# Patient Record
Sex: Male | Born: 1935 | Race: White | Hispanic: No | Marital: Married | Smoking: Former smoker
Health system: Southern US, Community
[De-identification: ages and names within clinical notes are randomized; demographics above are authoritative.]

## PROBLEM LIST (undated history)

## (undated) DIAGNOSIS — I739 Peripheral vascular disease, unspecified: Secondary | ICD-10-CM

## (undated) DIAGNOSIS — I447 Left bundle-branch block, unspecified: Secondary | ICD-10-CM

## (undated) DIAGNOSIS — I442 Atrioventricular block, complete: Secondary | ICD-10-CM

## (undated) DIAGNOSIS — K4090 Unilateral inguinal hernia, without obstruction or gangrene, not specified as recurrent: Secondary | ICD-10-CM

## (undated) DIAGNOSIS — E119 Type 2 diabetes mellitus without complications: Secondary | ICD-10-CM

## (undated) DIAGNOSIS — D649 Anemia, unspecified: Secondary | ICD-10-CM

## (undated) DIAGNOSIS — N189 Chronic kidney disease, unspecified: Secondary | ICD-10-CM

## (undated) DIAGNOSIS — H269 Unspecified cataract: Secondary | ICD-10-CM

## (undated) DIAGNOSIS — D469 Myelodysplastic syndrome, unspecified: Secondary | ICD-10-CM

## (undated) DIAGNOSIS — C801 Malignant (primary) neoplasm, unspecified: Secondary | ICD-10-CM

## (undated) DIAGNOSIS — I10 Essential (primary) hypertension: Secondary | ICD-10-CM

## (undated) DIAGNOSIS — Z950 Presence of cardiac pacemaker: Secondary | ICD-10-CM

## (undated) DIAGNOSIS — E039 Hypothyroidism, unspecified: Secondary | ICD-10-CM

## (undated) DIAGNOSIS — I509 Heart failure, unspecified: Secondary | ICD-10-CM

## (undated) HISTORY — PX: PROSTATE SURGERY: SHX751

## (undated) HISTORY — PX: HIATAL HERNIA REPAIR: SHX195

## (undated) HISTORY — PX: ABDOMINAL AORTIC ANEURYSM REPAIR: SUR1152

## (undated) HISTORY — DX: Type 2 diabetes mellitus without complications: E11.9

## (undated) HISTORY — DX: Malignant (primary) neoplasm, unspecified: C80.1

## (undated) HISTORY — PX: TONSILLECTOMY: SUR1361

## (undated) HISTORY — PX: TONSILLECTOMY: SHX5217

## (undated) HISTORY — DX: Myelodysplastic syndrome, unspecified: D46.9

## (undated) HISTORY — DX: Anemia, unspecified: D64.9

## (undated) HISTORY — DX: Essential (primary) hypertension: I10

## (undated) HISTORY — DX: Left bundle-branch block, unspecified: I44.7

---

## 1977-07-10 HISTORY — PX: HERNIA REPAIR: SHX51

## 1983-12-09 HISTORY — PX: THYROID LOBECTOMY: SHX420

## 1996-07-10 HISTORY — PX: ACROMIONECTOMY: SHX1124

## 2000-02-07 ENCOUNTER — Encounter: Payer: Self-pay | Admitting: Internal Medicine

## 2000-02-07 ENCOUNTER — Encounter: Admission: RE | Admit: 2000-02-07 | Discharge: 2000-02-07 | Payer: Self-pay | Admitting: Internal Medicine

## 2000-02-17 ENCOUNTER — Encounter: Payer: Self-pay | Admitting: Vascular Surgery

## 2000-02-20 ENCOUNTER — Ambulatory Visit (HOSPITAL_COMMUNITY): Admission: RE | Admit: 2000-02-20 | Discharge: 2000-02-20 | Payer: Self-pay | Admitting: Vascular Surgery

## 2000-04-23 ENCOUNTER — Inpatient Hospital Stay (HOSPITAL_COMMUNITY): Admission: RE | Admit: 2000-04-23 | Discharge: 2000-04-27 | Payer: Self-pay | Admitting: Vascular Surgery

## 2000-04-23 ENCOUNTER — Encounter: Payer: Self-pay | Admitting: Vascular Surgery

## 2000-04-25 ENCOUNTER — Encounter: Payer: Self-pay | Admitting: Vascular Surgery

## 2000-05-24 ENCOUNTER — Encounter: Payer: Self-pay | Admitting: Vascular Surgery

## 2000-05-24 ENCOUNTER — Encounter: Admission: RE | Admit: 2000-05-24 | Discharge: 2000-05-24 | Payer: Self-pay | Admitting: Vascular Surgery

## 2000-11-29 ENCOUNTER — Encounter: Payer: Self-pay | Admitting: Vascular Surgery

## 2000-11-29 ENCOUNTER — Encounter: Admission: RE | Admit: 2000-11-29 | Discharge: 2000-11-29 | Payer: Self-pay | Admitting: Vascular Surgery

## 2000-12-31 ENCOUNTER — Encounter: Payer: Self-pay | Admitting: Vascular Surgery

## 2001-01-02 ENCOUNTER — Ambulatory Visit (HOSPITAL_COMMUNITY): Admission: RE | Admit: 2001-01-02 | Discharge: 2001-01-02 | Payer: Self-pay | Admitting: Vascular Surgery

## 2001-06-17 ENCOUNTER — Encounter: Payer: Self-pay | Admitting: *Deleted

## 2001-06-17 ENCOUNTER — Encounter: Admission: RE | Admit: 2001-06-17 | Discharge: 2001-06-17 | Payer: Self-pay | Admitting: *Deleted

## 2001-06-18 ENCOUNTER — Encounter (INDEPENDENT_AMBULATORY_CARE_PROVIDER_SITE_OTHER): Payer: Self-pay

## 2001-06-18 ENCOUNTER — Ambulatory Visit (HOSPITAL_BASED_OUTPATIENT_CLINIC_OR_DEPARTMENT_OTHER): Admission: RE | Admit: 2001-06-18 | Discharge: 2001-06-18 | Payer: Self-pay | Admitting: *Deleted

## 2001-06-24 ENCOUNTER — Ambulatory Visit: Admission: RE | Admit: 2001-06-24 | Discharge: 2001-09-22 | Payer: Self-pay | Admitting: Radiation Oncology

## 2001-06-27 ENCOUNTER — Encounter: Payer: Self-pay | Admitting: Vascular Surgery

## 2001-06-27 ENCOUNTER — Encounter: Admission: RE | Admit: 2001-06-27 | Discharge: 2001-06-27 | Payer: Self-pay | Admitting: Vascular Surgery

## 2001-09-23 ENCOUNTER — Ambulatory Visit: Admission: RE | Admit: 2001-09-23 | Discharge: 2001-12-22 | Payer: Self-pay | Admitting: Radiation Oncology

## 2002-04-08 ENCOUNTER — Ambulatory Visit: Admission: RE | Admit: 2002-04-08 | Discharge: 2002-04-08 | Payer: Self-pay | Admitting: Radiation Oncology

## 2002-06-25 ENCOUNTER — Encounter: Admission: RE | Admit: 2002-06-25 | Discharge: 2002-06-25 | Payer: Self-pay | Admitting: Vascular Surgery

## 2002-06-25 ENCOUNTER — Encounter: Payer: Self-pay | Admitting: Vascular Surgery

## 2003-06-25 ENCOUNTER — Encounter: Admission: RE | Admit: 2003-06-25 | Discharge: 2003-06-25 | Payer: Self-pay | Admitting: Vascular Surgery

## 2004-06-30 ENCOUNTER — Encounter: Admission: RE | Admit: 2004-06-30 | Discharge: 2004-06-30 | Payer: Self-pay | Admitting: Vascular Surgery

## 2004-07-25 ENCOUNTER — Encounter (INDEPENDENT_AMBULATORY_CARE_PROVIDER_SITE_OTHER): Payer: Self-pay | Admitting: Specialist

## 2004-07-25 ENCOUNTER — Ambulatory Visit (HOSPITAL_COMMUNITY): Admission: RE | Admit: 2004-07-25 | Discharge: 2004-07-25 | Payer: Self-pay | Admitting: *Deleted

## 2004-11-04 ENCOUNTER — Ambulatory Visit: Payer: Self-pay | Admitting: Oncology

## 2004-12-09 ENCOUNTER — Encounter (INDEPENDENT_AMBULATORY_CARE_PROVIDER_SITE_OTHER): Payer: Self-pay | Admitting: *Deleted

## 2004-12-09 ENCOUNTER — Ambulatory Visit: Payer: Self-pay | Admitting: Oncology

## 2004-12-09 ENCOUNTER — Ambulatory Visit (HOSPITAL_COMMUNITY): Admission: RE | Admit: 2004-12-09 | Discharge: 2004-12-09 | Payer: Self-pay | Admitting: Oncology

## 2005-01-05 ENCOUNTER — Ambulatory Visit: Payer: Self-pay | Admitting: Oncology

## 2005-03-02 ENCOUNTER — Ambulatory Visit: Payer: Self-pay | Admitting: Oncology

## 2005-04-27 ENCOUNTER — Ambulatory Visit: Payer: Self-pay | Admitting: Oncology

## 2005-06-23 ENCOUNTER — Ambulatory Visit: Payer: Self-pay | Admitting: Oncology

## 2005-06-29 ENCOUNTER — Encounter: Admission: RE | Admit: 2005-06-29 | Discharge: 2005-06-29 | Payer: Self-pay | Admitting: Vascular Surgery

## 2005-08-17 ENCOUNTER — Ambulatory Visit: Payer: Self-pay | Admitting: Oncology

## 2005-10-19 ENCOUNTER — Ambulatory Visit: Payer: Self-pay | Admitting: Oncology

## 2005-10-20 LAB — CBC WITH DIFFERENTIAL/PLATELET
Basophils Absolute: 0 10*3/uL (ref 0.0–0.1)
Eosinophils Absolute: 0.4 10*3/uL (ref 0.0–0.5)
HCT: 33.8 % — ABNORMAL LOW (ref 38.7–49.9)
HGB: 11.4 g/dL — ABNORMAL LOW (ref 13.0–17.1)
MONO#: 0.6 10*3/uL (ref 0.1–0.9)
NEUT%: 62.6 % (ref 40.0–75.0)
Platelets: 261 10*3/uL (ref 145–400)
WBC: 9.4 10*3/uL (ref 4.0–10.0)
lymph#: 2.4 10*3/uL (ref 0.9–3.3)

## 2005-11-10 LAB — CBC WITH DIFFERENTIAL/PLATELET
Basophils Absolute: 0.1 10*3/uL (ref 0.0–0.1)
EOS%: 4.1 % (ref 0.0–7.0)
Eosinophils Absolute: 0.3 10*3/uL (ref 0.0–0.5)
HCT: 31.3 % — ABNORMAL LOW (ref 38.7–49.9)
HGB: 10.5 g/dL — ABNORMAL LOW (ref 13.0–17.1)
MCH: 36.3 pg — ABNORMAL HIGH (ref 28.0–33.4)
MCV: 108.2 fL — ABNORMAL HIGH (ref 81.6–98.0)
MONO%: 6.4 % (ref 0.0–13.0)
NEUT#: 5.3 10*3/uL (ref 1.5–6.5)
NEUT%: 64.7 % (ref 40.0–75.0)
Platelets: 246 10*3/uL (ref 145–400)
RDW: 20.7 % — ABNORMAL HIGH (ref 11.2–14.6)

## 2005-11-24 LAB — CBC & DIFF AND RETIC
Basophils Absolute: 0 10*3/uL (ref 0.0–0.1)
EOS%: 4.3 % (ref 0.0–7.0)
Eosinophils Absolute: 0.3 10*3/uL (ref 0.0–0.5)
LYMPH%: 23.7 % (ref 14.0–48.0)
MCH: 36.3 pg — ABNORMAL HIGH (ref 28.0–33.4)
MCV: 108.8 fL — ABNORMAL HIGH (ref 81.6–98.0)
MONO%: 7.4 % (ref 0.0–13.0)
NEUT#: 5 10*3/uL (ref 1.5–6.5)
Platelets: 224 10*3/uL (ref 145–400)
RBC: 2.99 10*6/uL — ABNORMAL LOW (ref 4.20–5.71)

## 2005-11-24 LAB — RETICULOCYTES (CHCC): ABS Retic: 30.7 10*3/uL (ref 19.0–186.0)

## 2005-11-30 ENCOUNTER — Encounter: Admission: RE | Admit: 2005-11-30 | Discharge: 2005-12-27 | Payer: Self-pay | Admitting: Internal Medicine

## 2005-12-03 ENCOUNTER — Ambulatory Visit: Payer: Self-pay | Admitting: Oncology

## 2005-12-08 LAB — CBC & DIFF AND RETIC
Basophils Absolute: 0 10*3/uL (ref 0.0–0.1)
Eosinophils Absolute: 0.3 10*3/uL (ref 0.0–0.5)
HCT: 33.9 % — ABNORMAL LOW (ref 38.7–49.9)
HGB: 11.4 g/dL — ABNORMAL LOW (ref 13.0–17.1)
IRF: 0.13 (ref 0.070–0.380)
LYMPH%: 33.2 % (ref 14.0–48.0)
MONO#: 0.7 10*3/uL (ref 0.1–0.9)
NEUT%: 51 % (ref 40.0–75.0)
Platelets: 201 10*3/uL (ref 145–400)
WBC: 6.3 10*3/uL (ref 4.0–10.0)
lymph#: 2.1 10*3/uL (ref 0.9–3.3)

## 2005-12-22 LAB — CBC WITH DIFFERENTIAL/PLATELET
BASO%: 0.4 % (ref 0.0–2.0)
Basophils Absolute: 0 10*3/uL (ref 0.0–0.1)
Eosinophils Absolute: 0.3 10*3/uL (ref 0.0–0.5)
HCT: 34.1 % — ABNORMAL LOW (ref 38.7–49.9)
HGB: 11.5 g/dL — ABNORMAL LOW (ref 13.0–17.1)
MCHC: 33.7 g/dL (ref 32.0–35.9)
MONO#: 0.6 10*3/uL (ref 0.1–0.9)
NEUT#: 4 10*3/uL (ref 1.5–6.5)
NEUT%: 58.9 % (ref 40.0–75.0)
WBC: 6.7 10*3/uL (ref 4.0–10.0)
lymph#: 1.9 10*3/uL (ref 0.9–3.3)

## 2006-01-05 LAB — CBC WITH DIFFERENTIAL/PLATELET
Basophils Absolute: 0 10*3/uL (ref 0.0–0.1)
EOS%: 3.8 % (ref 0.0–7.0)
HCT: 34.3 % — ABNORMAL LOW (ref 38.7–49.9)
HGB: 11.6 g/dL — ABNORMAL LOW (ref 13.0–17.1)
MCH: 37.1 pg — ABNORMAL HIGH (ref 28.0–33.4)
NEUT%: 61.6 % (ref 40.0–75.0)
Platelets: 205 10*3/uL (ref 145–400)
lymph#: 2 10*3/uL (ref 0.9–3.3)

## 2006-01-19 ENCOUNTER — Ambulatory Visit: Payer: Self-pay | Admitting: Oncology

## 2006-01-19 LAB — CBC WITH DIFFERENTIAL/PLATELET
Basophils Absolute: 0 10*3/uL (ref 0.0–0.1)
EOS%: 3.9 % (ref 0.0–7.0)
HCT: 34.7 % — ABNORMAL LOW (ref 38.7–49.9)
HGB: 11.7 g/dL — ABNORMAL LOW (ref 13.0–17.1)
MCH: 36.8 pg — ABNORMAL HIGH (ref 28.0–33.4)
MCV: 109.5 fL — ABNORMAL HIGH (ref 81.6–98.0)
MONO%: 7.6 % (ref 0.0–13.0)
NEUT%: 60.1 % (ref 40.0–75.0)
lymph#: 1.9 10*3/uL (ref 0.9–3.3)

## 2006-02-02 LAB — CBC WITH DIFFERENTIAL/PLATELET
BASO%: 0.3 % (ref 0.0–2.0)
Basophils Absolute: 0 10*3/uL (ref 0.0–0.1)
HGB: 12 g/dL — ABNORMAL LOW (ref 13.0–17.1)
MCH: 36.5 pg — ABNORMAL HIGH (ref 28.0–33.4)
MCV: 110.1 fL — ABNORMAL HIGH (ref 81.6–98.0)
NEUT%: 62.3 % (ref 40.0–75.0)
RDW: 20.5 % — ABNORMAL HIGH (ref 11.2–14.6)

## 2006-02-16 LAB — CBC WITH DIFFERENTIAL/PLATELET
BASO%: 0.3 % (ref 0.0–2.0)
EOS%: 5 % (ref 0.0–7.0)
Eosinophils Absolute: 0.4 10*3/uL (ref 0.0–0.5)
LYMPH%: 23 % (ref 14.0–48.0)
MCH: 36.9 pg — ABNORMAL HIGH (ref 28.0–33.4)
MCHC: 34.2 g/dL (ref 32.0–35.9)
MCV: 107.9 fL — ABNORMAL HIGH (ref 81.6–98.0)
MONO%: 6.1 % (ref 0.0–13.0)
Platelets: 270 10*3/uL (ref 145–400)
RBC: 3 10*6/uL — ABNORMAL LOW (ref 4.20–5.71)
RDW: 19.5 % — ABNORMAL HIGH (ref 11.2–14.6)

## 2006-03-02 LAB — CBC WITH DIFFERENTIAL/PLATELET
BASO%: 0.3 % (ref 0.0–2.0)
LYMPH%: 25.7 % (ref 14.0–48.0)
MCHC: 33.7 g/dL (ref 32.0–35.9)
MONO#: 0.4 10*3/uL (ref 0.1–0.9)
NEUT#: 4.6 10*3/uL (ref 1.5–6.5)
Platelets: 244 10*3/uL (ref 145–400)
RBC: 3.02 10*6/uL — ABNORMAL LOW (ref 4.20–5.71)
RDW: 20.3 % — ABNORMAL HIGH (ref 11.2–14.6)
WBC: 7.2 10*3/uL (ref 4.0–10.0)

## 2006-03-15 ENCOUNTER — Ambulatory Visit: Payer: Self-pay | Admitting: Oncology

## 2006-03-16 LAB — CBC WITH DIFFERENTIAL/PLATELET
BASO%: 1.6 % (ref 0.0–2.0)
Eosinophils Absolute: 0.3 10*3/uL (ref 0.0–0.5)
HCT: 34.4 % — ABNORMAL LOW (ref 38.7–49.9)
MCHC: 33.8 g/dL (ref 32.0–35.9)
MONO#: 0.4 10*3/uL (ref 0.1–0.9)
NEUT#: 4.8 10*3/uL (ref 1.5–6.5)
NEUT%: 63.1 % (ref 40.0–75.0)
RBC: 3.19 10*6/uL — ABNORMAL LOW (ref 4.20–5.71)
WBC: 7.6 10*3/uL (ref 4.0–10.0)
lymph#: 1.9 10*3/uL (ref 0.9–3.3)

## 2006-03-30 LAB — CBC WITH DIFFERENTIAL/PLATELET
Basophils Absolute: 0 10*3/uL (ref 0.0–0.1)
Eosinophils Absolute: 0.3 10*3/uL (ref 0.0–0.5)
HCT: 32.7 % — ABNORMAL LOW (ref 38.7–49.9)
HGB: 11.1 g/dL — ABNORMAL LOW (ref 13.0–17.1)
MONO#: 0.6 10*3/uL (ref 0.1–0.9)
NEUT%: 61.8 % (ref 40.0–75.0)
WBC: 6.8 10*3/uL (ref 4.0–10.0)
lymph#: 1.7 10*3/uL (ref 0.9–3.3)

## 2006-04-13 LAB — CBC WITH DIFFERENTIAL/PLATELET
Basophils Absolute: 0 10*3/uL (ref 0.0–0.1)
EOS%: 4.4 % (ref 0.0–7.0)
Eosinophils Absolute: 0.3 10*3/uL (ref 0.0–0.5)
HGB: 11 g/dL — ABNORMAL LOW (ref 13.0–17.1)
LYMPH%: 27.6 % (ref 14.0–48.0)
MCH: 36.9 pg — ABNORMAL HIGH (ref 28.0–33.4)
MCV: 103 fL — ABNORMAL HIGH (ref 81.6–98.0)
MONO%: 8.7 % (ref 0.0–13.0)
NEUT#: 3.9 10*3/uL (ref 1.5–6.5)
NEUT%: 58.6 % (ref 40.0–75.0)
Platelets: 185 10*3/uL (ref 145–400)
RDW: 18.4 % — ABNORMAL HIGH (ref 11.2–14.6)

## 2006-05-09 ENCOUNTER — Ambulatory Visit: Payer: Self-pay | Admitting: Oncology

## 2006-05-11 LAB — CBC WITH DIFFERENTIAL/PLATELET
BASO%: 0.5 % (ref 0.0–2.0)
EOS%: 4.3 % (ref 0.0–7.0)
Eosinophils Absolute: 0.3 10*3/uL (ref 0.0–0.5)
MCV: 110.1 fL — ABNORMAL HIGH (ref 81.6–98.0)
MONO%: 7.4 % (ref 0.0–13.0)
NEUT#: 4.5 10*3/uL (ref 1.5–6.5)
RBC: 3.19 10*6/uL — ABNORMAL LOW (ref 4.20–5.71)
RDW: 20 % — ABNORMAL HIGH (ref 11.2–14.6)

## 2006-05-25 LAB — CBC WITH DIFFERENTIAL/PLATELET
Eosinophils Absolute: 0.3 10*3/uL (ref 0.0–0.5)
MONO#: 0.5 10*3/uL (ref 0.1–0.9)
NEUT#: 4.5 10*3/uL (ref 1.5–6.5)
Platelets: 179 10*3/uL (ref 145–400)
RBC: 2.99 10*6/uL — ABNORMAL LOW (ref 4.20–5.71)
RDW: 20.6 % — ABNORMAL HIGH (ref 11.2–14.6)
WBC: 6.8 10*3/uL (ref 4.0–10.0)
lymph#: 1.6 10*3/uL (ref 0.9–3.3)

## 2006-06-06 ENCOUNTER — Ambulatory Visit: Payer: Self-pay | Admitting: Oncology

## 2006-06-08 LAB — CBC WITH DIFFERENTIAL/PLATELET
Basophils Absolute: 0.1 10*3/uL (ref 0.0–0.1)
EOS%: 5 % (ref 0.0–7.0)
Eosinophils Absolute: 0.4 10*3/uL (ref 0.0–0.5)
HCT: 34.3 % — ABNORMAL LOW (ref 38.7–49.9)
HGB: 12.1 g/dL — ABNORMAL LOW (ref 13.0–17.1)
MCH: 37.2 pg — ABNORMAL HIGH (ref 28.0–33.4)
MCV: 105 fL — ABNORMAL HIGH (ref 81.6–98.0)
NEUT#: 4.8 10*3/uL (ref 1.5–6.5)
NEUT%: 59.2 % (ref 40.0–75.0)
RDW: 17.2 % — ABNORMAL HIGH (ref 11.2–14.6)
lymph#: 2.3 10*3/uL (ref 0.9–3.3)

## 2006-06-22 LAB — CBC WITH DIFFERENTIAL/PLATELET
Basophils Absolute: 0 10*3/uL (ref 0.0–0.1)
EOS%: 5.5 % (ref 0.0–7.0)
HCT: 35.4 % — ABNORMAL LOW (ref 38.7–49.9)
HGB: 11.9 g/dL — ABNORMAL LOW (ref 13.0–17.1)
LYMPH%: 28 % (ref 14.0–48.0)
MCH: 36.6 pg — ABNORMAL HIGH (ref 28.0–33.4)
MCV: 109.3 fL — ABNORMAL HIGH (ref 81.6–98.0)
MONO%: 7.2 % (ref 0.0–13.0)
NEUT%: 58.9 % (ref 40.0–75.0)

## 2006-07-06 LAB — CBC WITH DIFFERENTIAL/PLATELET
Basophils Absolute: 0.1 10*3/uL (ref 0.0–0.1)
EOS%: 4.2 % (ref 0.0–7.0)
HGB: 12.1 g/dL — ABNORMAL LOW (ref 13.0–17.1)
MCH: 36.5 pg — ABNORMAL HIGH (ref 28.0–33.4)
MCV: 109.7 fL — ABNORMAL HIGH (ref 81.6–98.0)
MONO%: 6.5 % (ref 0.0–13.0)
RBC: 3.32 10*6/uL — ABNORMAL LOW (ref 4.20–5.71)
RDW: 20.1 % — ABNORMAL HIGH (ref 11.2–14.6)

## 2006-07-17 ENCOUNTER — Ambulatory Visit: Payer: Self-pay | Admitting: Oncology

## 2006-07-20 LAB — CBC WITH DIFFERENTIAL/PLATELET
Basophils Absolute: 0 10*3/uL (ref 0.0–0.1)
EOS%: 4.7 % (ref 0.0–7.0)
HCT: 32.3 % — ABNORMAL LOW (ref 38.7–49.9)
HGB: 10.8 g/dL — ABNORMAL LOW (ref 13.0–17.1)
LYMPH%: 24.6 % (ref 14.0–48.0)
MCH: 36 pg — ABNORMAL HIGH (ref 28.0–33.4)
MCV: 107.8 fL — ABNORMAL HIGH (ref 81.6–98.0)
MONO%: 7 % (ref 0.0–13.0)
NEUT%: 63.4 % (ref 40.0–75.0)
Platelets: 219 10*3/uL (ref 145–400)
lymph#: 2.1 10*3/uL (ref 0.9–3.3)

## 2006-08-03 LAB — CBC WITH DIFFERENTIAL/PLATELET
Basophils Absolute: 0.1 10*3/uL (ref 0.0–0.1)
EOS%: 3.7 % (ref 0.0–7.0)
HGB: 11.4 g/dL — ABNORMAL LOW (ref 13.0–17.1)
MCH: 35 pg — ABNORMAL HIGH (ref 28.0–33.4)
MCV: 106 fL — ABNORMAL HIGH (ref 81.6–98.0)
MONO%: 6.8 % (ref 0.0–13.0)
RDW: 17.4 % — ABNORMAL HIGH (ref 11.2–14.6)

## 2006-08-17 LAB — CBC WITH DIFFERENTIAL/PLATELET
Basophils Absolute: 0 10*3/uL (ref 0.0–0.1)
EOS%: 3.8 % (ref 0.0–7.0)
Eosinophils Absolute: 0.3 10*3/uL (ref 0.0–0.5)
LYMPH%: 28 % (ref 14.0–48.0)
MCH: 37.3 pg — ABNORMAL HIGH (ref 28.0–33.4)
MCV: 107.6 fL — ABNORMAL HIGH (ref 81.6–98.0)
MONO%: 7.4 % (ref 0.0–13.0)
NEUT#: 4.8 10*3/uL (ref 1.5–6.5)
Platelets: 191 10*3/uL (ref 145–400)
RBC: 3.18 10*6/uL — ABNORMAL LOW (ref 4.20–5.71)
RDW: 20.3 % — ABNORMAL HIGH (ref 11.2–14.6)

## 2006-08-29 ENCOUNTER — Ambulatory Visit: Payer: Self-pay | Admitting: Oncology

## 2006-08-31 LAB — CBC WITH DIFFERENTIAL/PLATELET
BASO%: 0.3 % (ref 0.0–2.0)
EOS%: 3.9 % (ref 0.0–7.0)
LYMPH%: 26.1 % (ref 14.0–48.0)
MCHC: 34 g/dL (ref 32.0–35.9)
MCV: 107.6 fL — ABNORMAL HIGH (ref 81.6–98.0)
MONO%: 5.9 % (ref 0.0–13.0)
NEUT#: 5.2 10*3/uL (ref 1.5–6.5)
Platelets: 224 10*3/uL (ref 145–400)
RBC: 3.29 10*6/uL — ABNORMAL LOW (ref 4.20–5.71)
RDW: 20.7 % — ABNORMAL HIGH (ref 11.2–14.6)

## 2006-09-14 LAB — CBC WITH DIFFERENTIAL/PLATELET
BASO%: 1.2 % (ref 0.0–2.0)
Eosinophils Absolute: 0.4 10*3/uL (ref 0.0–0.5)
LYMPH%: 26.4 % (ref 14.0–48.0)
MCHC: 34.6 g/dL (ref 32.0–35.9)
MONO#: 0.7 10*3/uL (ref 0.1–0.9)
NEUT#: 4.9 10*3/uL (ref 1.5–6.5)
RBC: 3.17 10*6/uL — ABNORMAL LOW (ref 4.20–5.71)
RDW: 17.4 % — ABNORMAL HIGH (ref 11.2–14.6)
WBC: 8.2 10*3/uL (ref 4.0–10.0)
lymph#: 2.2 10*3/uL (ref 0.9–3.3)

## 2006-09-28 LAB — CBC WITH DIFFERENTIAL/PLATELET
BASO%: 0.4 % (ref 0.0–2.0)
Eosinophils Absolute: 0.3 10*3/uL (ref 0.0–0.5)
HCT: 34.4 % — ABNORMAL LOW (ref 38.7–49.9)
HGB: 11.8 g/dL — ABNORMAL LOW (ref 13.0–17.1)
MCHC: 34.2 g/dL (ref 32.0–35.9)
MONO#: 0.5 10*3/uL (ref 0.1–0.9)
NEUT#: 4.9 10*3/uL (ref 1.5–6.5)
NEUT%: 64.1 % (ref 40.0–75.0)
WBC: 7.6 10*3/uL (ref 4.0–10.0)
lymph#: 1.9 10*3/uL (ref 0.9–3.3)

## 2006-10-10 ENCOUNTER — Ambulatory Visit: Payer: Self-pay | Admitting: Oncology

## 2006-10-12 LAB — CBC WITH DIFFERENTIAL/PLATELET
Basophils Absolute: 0.1 10*3/uL (ref 0.0–0.1)
EOS%: 4.1 % (ref 0.0–7.0)
HCT: 35.6 % — ABNORMAL LOW (ref 38.7–49.9)
HGB: 12.1 g/dL — ABNORMAL LOW (ref 13.0–17.1)
MCH: 37.1 pg — ABNORMAL HIGH (ref 28.0–33.4)
MONO#: 0.6 10*3/uL (ref 0.1–0.9)
NEUT%: 60.6 % (ref 40.0–75.0)
Platelets: 171 10*3/uL (ref 145–400)
lymph#: 2 10*3/uL (ref 0.9–3.3)

## 2006-10-26 LAB — CBC WITH DIFFERENTIAL/PLATELET
BASO%: 0.6 % (ref 0.0–2.0)
HCT: 36.3 % — ABNORMAL LOW (ref 38.7–49.9)
LYMPH%: 26.4 % (ref 14.0–48.0)
MCH: 36.9 pg — ABNORMAL HIGH (ref 28.0–33.4)
MCHC: 34.2 g/dL (ref 32.0–35.9)
MCV: 108.2 fL — ABNORMAL HIGH (ref 81.6–98.0)
MONO#: 0.5 10*3/uL (ref 0.1–0.9)
MONO%: 6.8 % (ref 0.0–13.0)
NEUT%: 61.4 % (ref 40.0–75.0)
Platelets: 194 10*3/uL (ref 145–400)
WBC: 7.8 10*3/uL (ref 4.0–10.0)

## 2006-11-08 LAB — CBC WITH DIFFERENTIAL/PLATELET
BASO%: 1.9 % (ref 0.0–2.0)
EOS%: 3.5 % (ref 0.0–7.0)
HCT: 33 % — ABNORMAL LOW (ref 38.7–49.9)
LYMPH%: 27.7 % (ref 14.0–48.0)
MCH: 36.8 pg — ABNORMAL HIGH (ref 28.0–33.4)
MCHC: 34.5 g/dL (ref 32.0–35.9)
NEUT%: 59.8 % (ref 40.0–75.0)
lymph#: 2.7 10*3/uL (ref 0.9–3.3)

## 2006-11-21 ENCOUNTER — Ambulatory Visit: Payer: Self-pay | Admitting: Oncology

## 2006-11-23 LAB — CBC WITH DIFFERENTIAL/PLATELET
BASO%: 0.5 % (ref 0.0–2.0)
EOS%: 4.2 % (ref 0.0–7.0)
HGB: 11.8 g/dL — ABNORMAL LOW (ref 13.0–17.1)
MCH: 36.3 pg — ABNORMAL HIGH (ref 28.0–33.4)
MCHC: 34.1 g/dL (ref 32.0–35.9)
RDW: 20.7 % — ABNORMAL HIGH (ref 11.2–14.6)
lymph#: 2.2 10*3/uL (ref 0.9–3.3)

## 2006-12-07 LAB — CBC WITH DIFFERENTIAL/PLATELET
Basophils Absolute: 0 10*3/uL (ref 0.0–0.1)
Eosinophils Absolute: 0.3 10*3/uL (ref 0.0–0.5)
HGB: 11.5 g/dL — ABNORMAL LOW (ref 13.0–17.1)
NEUT#: 7.1 10*3/uL — ABNORMAL HIGH (ref 1.5–6.5)
RDW: 20.5 % — ABNORMAL HIGH (ref 11.2–14.6)
WBC: 10.1 10*3/uL — ABNORMAL HIGH (ref 4.0–10.0)
lymph#: 2 10*3/uL (ref 0.9–3.3)

## 2006-12-21 LAB — CBC WITH DIFFERENTIAL/PLATELET
Basophils Absolute: 0.1 10*3/uL (ref 0.0–0.1)
Eosinophils Absolute: 0.3 10*3/uL (ref 0.0–0.5)
HGB: 11.6 g/dL — ABNORMAL LOW (ref 13.0–17.1)
MCV: 106.3 fL — ABNORMAL HIGH (ref 81.6–98.0)
MONO#: 0.5 10*3/uL (ref 0.1–0.9)
MONO%: 5.9 % (ref 0.0–13.0)
NEUT#: 4.9 10*3/uL (ref 1.5–6.5)
Platelets: 200 10*3/uL (ref 145–400)
RDW: 21.2 % — ABNORMAL HIGH (ref 11.2–14.6)

## 2007-01-01 ENCOUNTER — Ambulatory Visit: Payer: Self-pay | Admitting: Oncology

## 2007-01-04 LAB — CBC WITH DIFFERENTIAL/PLATELET
Basophils Absolute: 0.1 10*3/uL (ref 0.0–0.1)
Eosinophils Absolute: 0.3 10*3/uL (ref 0.0–0.5)
HCT: 33.5 % — ABNORMAL LOW (ref 38.7–49.9)
LYMPH%: 29.3 % (ref 14.0–48.0)
MCV: 106.5 fL — ABNORMAL HIGH (ref 81.6–98.0)
MONO%: 8 % (ref 0.0–13.0)
NEUT#: 4.2 10*3/uL (ref 1.5–6.5)
NEUT%: 57.7 % (ref 40.0–75.0)
Platelets: 183 10*3/uL (ref 145–400)
RBC: 3.15 10*6/uL — ABNORMAL LOW (ref 4.20–5.71)

## 2007-01-18 LAB — CBC WITH DIFFERENTIAL/PLATELET
Basophils Absolute: 0 10*3/uL (ref 0.0–0.1)
Eosinophils Absolute: 0.3 10*3/uL (ref 0.0–0.5)
HGB: 11.1 g/dL — ABNORMAL LOW (ref 13.0–17.1)
NEUT#: 4.8 10*3/uL (ref 1.5–6.5)
RDW: 21.3 % — ABNORMAL HIGH (ref 11.2–14.6)
WBC: 7.6 10*3/uL (ref 4.0–10.0)
lymph#: 2 10*3/uL (ref 0.9–3.3)

## 2007-02-01 LAB — CBC WITH DIFFERENTIAL/PLATELET
Basophils Absolute: 0.2 10*3/uL — ABNORMAL HIGH (ref 0.0–0.1)
Eosinophils Absolute: 0.3 10*3/uL (ref 0.0–0.5)
HGB: 11.2 g/dL — ABNORMAL LOW (ref 13.0–17.1)
LYMPH%: 25.4 % (ref 14.0–48.0)
MCV: 106 fL — ABNORMAL HIGH (ref 81.6–98.0)
MONO#: 0.4 10*3/uL (ref 0.1–0.9)
MONO%: 5.3 % (ref 0.0–13.0)
NEUT#: 4.9 10*3/uL (ref 1.5–6.5)
Platelets: 186 10*3/uL (ref 145–400)
RDW: 21.4 % — ABNORMAL HIGH (ref 11.2–14.6)
WBC: 7.8 10*3/uL (ref 4.0–10.0)

## 2007-02-15 ENCOUNTER — Ambulatory Visit: Payer: Self-pay | Admitting: Oncology

## 2007-02-15 LAB — CBC WITH DIFFERENTIAL/PLATELET
Eosinophils Absolute: 0.3 10*3/uL (ref 0.0–0.5)
HCT: 34.6 % — ABNORMAL LOW (ref 38.7–49.9)
LYMPH%: 25.1 % (ref 14.0–48.0)
MCV: 106.2 fL — ABNORMAL HIGH (ref 81.6–98.0)
MONO#: 0.5 10*3/uL (ref 0.1–0.9)
MONO%: 6.4 % (ref 0.0–13.0)
NEUT#: 5.4 10*3/uL (ref 1.5–6.5)
NEUT%: 63.9 % (ref 40.0–75.0)
Platelets: 208 10*3/uL (ref 145–400)
RBC: 3.26 10*6/uL — ABNORMAL LOW (ref 4.20–5.71)

## 2007-03-01 LAB — CBC WITH DIFFERENTIAL/PLATELET
BASO%: 0.9 % (ref 0.0–2.0)
EOS%: 3.8 % (ref 0.0–7.0)
HCT: 29.6 % — ABNORMAL LOW (ref 38.7–49.9)
LYMPH%: 24 % (ref 14.0–48.0)
MCH: 35.9 pg — ABNORMAL HIGH (ref 28.0–33.4)
MCHC: 35.5 g/dL (ref 32.0–35.9)
MCV: 101 fL — ABNORMAL HIGH (ref 81.6–98.0)
MONO%: 7 % (ref 0.0–13.0)
NEUT%: 64.3 % (ref 40.0–75.0)
Platelets: 219 10*3/uL (ref 145–400)
RBC: 2.93 10*6/uL — ABNORMAL LOW (ref 4.20–5.71)
WBC: 10 10*3/uL (ref 4.0–10.0)

## 2007-03-15 LAB — CBC WITH DIFFERENTIAL/PLATELET
BASO%: 0.6 % (ref 0.0–2.0)
EOS%: 3.7 % (ref 0.0–7.0)
HCT: 32 % — ABNORMAL LOW (ref 38.7–49.9)
MCH: 36.6 pg — ABNORMAL HIGH (ref 28.0–33.4)
MCHC: 34.6 g/dL (ref 32.0–35.9)
MONO#: 0.5 10*3/uL (ref 0.1–0.9)
NEUT%: 65.6 % (ref 40.0–75.0)
RBC: 3.02 10*6/uL — ABNORMAL LOW (ref 4.20–5.71)
WBC: 8.4 10*3/uL (ref 4.0–10.0)
lymph#: 2 10*3/uL (ref 0.9–3.3)

## 2007-03-28 ENCOUNTER — Ambulatory Visit: Payer: Self-pay | Admitting: Oncology

## 2007-03-29 LAB — CBC WITH DIFFERENTIAL/PLATELET
Basophils Absolute: 0 10*3/uL (ref 0.0–0.1)
EOS%: 3.5 % (ref 0.0–7.0)
HGB: 10.5 g/dL — ABNORMAL LOW (ref 13.0–17.1)
MCH: 36.3 pg — ABNORMAL HIGH (ref 28.0–33.4)
MCV: 105.1 fL — ABNORMAL HIGH (ref 81.6–98.0)
MONO%: 6.1 % (ref 0.0–13.0)
RBC: 2.89 10*6/uL — ABNORMAL LOW (ref 4.20–5.71)
RDW: 21.5 % — ABNORMAL HIGH (ref 11.2–14.6)

## 2007-04-12 LAB — CBC WITH DIFFERENTIAL/PLATELET
BASO%: 1 % (ref 0.0–2.0)
EOS%: 2.8 % (ref 0.0–7.0)
Eosinophils Absolute: 0.3 10*3/uL (ref 0.0–0.5)
MCHC: 34.8 g/dL (ref 32.0–35.9)
MCV: 104 fL — ABNORMAL HIGH (ref 81.6–98.0)
MONO%: 7.7 % (ref 0.0–13.0)
NEUT#: 6.7 10*3/uL — ABNORMAL HIGH (ref 1.5–6.5)
RBC: 3.02 10*6/uL — ABNORMAL LOW (ref 4.20–5.71)
RDW: 18.7 % — ABNORMAL HIGH (ref 11.2–14.6)

## 2007-04-26 LAB — CBC WITH DIFFERENTIAL/PLATELET
BASO%: 1.1 % (ref 0.0–2.0)
Eosinophils Absolute: 0.3 10*3/uL (ref 0.0–0.5)
MONO#: 0.8 10*3/uL (ref 0.1–0.9)
NEUT#: 5.5 10*3/uL (ref 1.5–6.5)
Platelets: 198 10*3/uL (ref 145–400)
RBC: 3.1 10*6/uL — ABNORMAL LOW (ref 4.20–5.71)
RDW: 18.7 % — ABNORMAL HIGH (ref 11.2–14.6)
WBC: 8.7 10*3/uL (ref 4.0–10.0)
lymph#: 2 10*3/uL (ref 0.9–3.3)

## 2007-05-07 ENCOUNTER — Ambulatory Visit: Payer: Self-pay | Admitting: Oncology

## 2007-05-09 LAB — CBC & DIFF AND RETIC
Eosinophils Absolute: 0.3 10*3/uL (ref 0.0–0.5)
HCT: 35.6 % — ABNORMAL LOW (ref 38.7–49.9)
HGB: 12 g/dL — ABNORMAL LOW (ref 13.0–17.1)
IRF: 0.21 (ref 0.070–0.380)
LYMPH%: 23 % (ref 14.0–48.0)
MONO#: 0.8 10*3/uL (ref 0.1–0.9)
NEUT#: 6.3 10*3/uL (ref 1.5–6.5)
NEUT%: 64.9 % (ref 40.0–75.0)
Platelets: 222 10*3/uL (ref 145–400)
WBC: 9.8 10*3/uL (ref 4.0–10.0)
lymph#: 2.2 10*3/uL (ref 0.9–3.3)

## 2007-05-09 LAB — FERRITIN: Ferritin: 744 ng/mL — ABNORMAL HIGH (ref 22–322)

## 2007-05-17 LAB — CBC WITH DIFFERENTIAL/PLATELET
BASO%: 0.6 % (ref 0.0–2.0)
EOS%: 3.8 % (ref 0.0–7.0)
HCT: 31.2 % — ABNORMAL LOW (ref 38.7–49.9)
HGB: 10.8 g/dL — ABNORMAL LOW (ref 13.0–17.1)
MCH: 36.8 pg — ABNORMAL HIGH (ref 28.0–33.4)
MCHC: 34.8 g/dL (ref 32.0–35.9)
MONO#: 0.7 10*3/uL (ref 0.1–0.9)
NEUT%: 60.6 % (ref 40.0–75.0)
RDW: 23.9 % — ABNORMAL HIGH (ref 11.2–14.6)
WBC: 7.5 10*3/uL (ref 4.0–10.0)
lymph#: 1.9 10*3/uL (ref 0.9–3.3)

## 2007-06-07 LAB — CBC WITH DIFFERENTIAL/PLATELET
BASO%: 0.7 % (ref 0.0–2.0)
Eosinophils Absolute: 0.3 10*3/uL (ref 0.0–0.5)
HCT: 31.2 % — ABNORMAL LOW (ref 38.7–49.9)
MCHC: 34.4 g/dL (ref 32.0–35.9)
MONO#: 0.7 10*3/uL (ref 0.1–0.9)
NEUT#: 5.1 10*3/uL (ref 1.5–6.5)
NEUT%: 61.4 % (ref 40.0–75.0)
Platelets: 227 10*3/uL (ref 145–400)
WBC: 8.3 10*3/uL (ref 4.0–10.0)
lymph#: 2.1 10*3/uL (ref 0.9–3.3)

## 2007-06-26 ENCOUNTER — Ambulatory Visit: Payer: Self-pay | Admitting: Oncology

## 2007-06-28 LAB — CBC WITH DIFFERENTIAL/PLATELET
Basophils Absolute: 0.1 10*3/uL (ref 0.0–0.1)
HCT: 31.7 % — ABNORMAL LOW (ref 38.7–49.9)
HGB: 10.9 g/dL — ABNORMAL LOW (ref 13.0–17.1)
LYMPH%: 22 % (ref 14.0–48.0)
MCH: 36.7 pg — ABNORMAL HIGH (ref 28.0–33.4)
MONO#: 1.2 10*3/uL — ABNORMAL HIGH (ref 0.1–0.9)
NEUT%: 62.6 % (ref 40.0–75.0)
Platelets: 250 10*3/uL (ref 145–400)
WBC: 10.8 10*3/uL — ABNORMAL HIGH (ref 4.0–10.0)
lymph#: 2.4 10*3/uL (ref 0.9–3.3)

## 2007-07-19 LAB — CBC WITH DIFFERENTIAL/PLATELET
Basophils Absolute: 0 10*3/uL (ref 0.0–0.1)
Eosinophils Absolute: 0.3 10*3/uL (ref 0.0–0.5)
HGB: 10.8 g/dL — ABNORMAL LOW (ref 13.0–17.1)
LYMPH%: 22.6 % (ref 14.0–48.0)
MCV: 107.4 fL — ABNORMAL HIGH (ref 81.6–98.0)
MONO%: 9.1 % (ref 0.0–13.0)
NEUT#: 5.3 10*3/uL (ref 1.5–6.5)
NEUT%: 64.6 % (ref 40.0–75.0)
Platelets: 189 10*3/uL (ref 145–400)

## 2007-08-07 ENCOUNTER — Ambulatory Visit: Payer: Self-pay | Admitting: Oncology

## 2007-08-09 LAB — CBC WITH DIFFERENTIAL/PLATELET
Eosinophils Absolute: 0.3 10*3/uL (ref 0.0–0.5)
LYMPH%: 26.8 % (ref 14.0–48.0)
MCH: 35.9 pg — ABNORMAL HIGH (ref 28.0–33.4)
MCHC: 33.6 g/dL (ref 32.0–35.9)
MCV: 106.9 fL — ABNORMAL HIGH (ref 81.6–98.0)
MONO%: 6.4 % (ref 0.0–13.0)
NEUT#: 4.8 10*3/uL (ref 1.5–6.5)
Platelets: 236 10*3/uL (ref 145–400)
RBC: 3.23 10*6/uL — ABNORMAL LOW (ref 4.20–5.71)

## 2007-08-30 LAB — CBC WITH DIFFERENTIAL/PLATELET
BASO%: 0.7 % (ref 0.0–2.0)
Basophils Absolute: 0.1 10*3/uL (ref 0.0–0.1)
EOS%: 3.6 % (ref 0.0–7.0)
HCT: 32 % — ABNORMAL LOW (ref 38.7–49.9)
HGB: 11.1 g/dL — ABNORMAL LOW (ref 13.0–17.1)
LYMPH%: 28 % (ref 14.0–48.0)
MCH: 36.5 pg — ABNORMAL HIGH (ref 28.0–33.4)
MCHC: 34.7 g/dL (ref 32.0–35.9)
MCV: 105.4 fL — ABNORMAL HIGH (ref 81.6–98.0)
MONO%: 8.7 % (ref 0.0–13.0)
NEUT%: 59 % (ref 40.0–75.0)

## 2007-09-18 ENCOUNTER — Ambulatory Visit: Payer: Self-pay | Admitting: Oncology

## 2007-09-20 LAB — CBC WITH DIFFERENTIAL/PLATELET
BASO%: 1.5 % (ref 0.0–2.0)
Basophils Absolute: 0.1 10*3/uL (ref 0.0–0.1)
HCT: 30.4 % — ABNORMAL LOW (ref 38.7–49.9)
HGB: 10.4 g/dL — ABNORMAL LOW (ref 13.0–17.1)
LYMPH%: 28.2 % (ref 14.0–48.0)
MCH: 36.8 pg — ABNORMAL HIGH (ref 28.0–33.4)
MCHC: 34.3 g/dL (ref 32.0–35.9)
MONO#: 0.6 10*3/uL (ref 0.1–0.9)
NEUT%: 58.1 % (ref 40.0–75.0)
Platelets: 210 10*3/uL (ref 145–400)
WBC: 7.4 10*3/uL (ref 4.0–10.0)
lymph#: 2.1 10*3/uL (ref 0.9–3.3)

## 2007-10-11 LAB — CBC WITH DIFFERENTIAL/PLATELET
Basophils Absolute: 0 10*3/uL (ref 0.0–0.1)
Eosinophils Absolute: 0.3 10*3/uL (ref 0.0–0.5)
HGB: 10.4 g/dL — ABNORMAL LOW (ref 13.0–17.1)
MONO#: 0.5 10*3/uL (ref 0.1–0.9)
NEUT#: 5.1 10*3/uL (ref 1.5–6.5)
RBC: 2.88 10*6/uL — ABNORMAL LOW (ref 4.20–5.71)
RDW: 22.3 % — ABNORMAL HIGH (ref 11.2–14.6)
WBC: 7.8 10*3/uL (ref 4.0–10.0)
lymph#: 1.9 10*3/uL (ref 0.9–3.3)

## 2007-10-30 ENCOUNTER — Ambulatory Visit: Payer: Self-pay | Admitting: Oncology

## 2007-11-01 LAB — CBC WITH DIFFERENTIAL/PLATELET
Basophils Absolute: 0 10*3/uL (ref 0.0–0.1)
Eosinophils Absolute: 0.3 10*3/uL (ref 0.0–0.5)
HGB: 10.2 g/dL — ABNORMAL LOW (ref 13.0–17.1)
LYMPH%: 26.6 % (ref 14.0–48.0)
MCV: 107.1 fL — ABNORMAL HIGH (ref 81.6–98.0)
MONO#: 0.5 10*3/uL (ref 0.1–0.9)
MONO%: 6.9 % (ref 0.0–13.0)
NEUT#: 4.6 10*3/uL (ref 1.5–6.5)
Platelets: 250 10*3/uL (ref 145–400)
RBC: 2.79 10*6/uL — ABNORMAL LOW (ref 4.20–5.71)
WBC: 7.3 10*3/uL (ref 4.0–10.0)

## 2007-11-01 LAB — TECHNOLOGIST REVIEW

## 2007-11-22 LAB — CBC WITH DIFFERENTIAL/PLATELET
BASO%: 1.5 % (ref 0.0–2.0)
Basophils Absolute: 0.1 10*3/uL (ref 0.0–0.1)
EOS%: 3.6 % (ref 0.0–7.0)
HGB: 10.5 g/dL — ABNORMAL LOW (ref 13.0–17.1)
MCH: 35.9 pg — ABNORMAL HIGH (ref 28.0–33.4)
MCHC: 34.1 g/dL (ref 32.0–35.9)
MONO%: 8.8 % (ref 0.0–13.0)
RBC: 2.92 10*6/uL — ABNORMAL LOW (ref 4.20–5.71)
RDW: 21.2 % — ABNORMAL HIGH (ref 11.2–14.6)
lymph#: 2.2 10*3/uL (ref 0.9–3.3)

## 2007-12-11 ENCOUNTER — Ambulatory Visit: Payer: Self-pay | Admitting: Oncology

## 2007-12-13 LAB — CBC WITH DIFFERENTIAL/PLATELET
BASO%: 0.6 % (ref 0.0–2.0)
EOS%: 4.1 % (ref 0.0–7.0)
HCT: 31.4 % — ABNORMAL LOW (ref 38.7–49.9)
LYMPH%: 23.8 % (ref 14.0–48.0)
MCH: 36.6 pg — ABNORMAL HIGH (ref 28.0–33.4)
MCHC: 34.1 g/dL (ref 32.0–35.9)
MCV: 107.3 fL — ABNORMAL HIGH (ref 81.6–98.0)
MONO%: 6.7 % (ref 0.0–13.0)
NEUT%: 64.8 % (ref 40.0–75.0)
Platelets: 189 10*3/uL (ref 145–400)
RBC: 2.93 10*6/uL — ABNORMAL LOW (ref 4.20–5.71)
WBC: 7.4 10*3/uL (ref 4.0–10.0)

## 2008-01-03 LAB — CBC WITH DIFFERENTIAL/PLATELET
BASO%: 0.7 % (ref 0.0–2.0)
EOS%: 3.9 % (ref 0.0–7.0)
HCT: 31.1 % — ABNORMAL LOW (ref 38.7–49.9)
MCH: 36.6 pg — ABNORMAL HIGH (ref 28.0–33.4)
MCHC: 34.3 g/dL (ref 32.0–35.9)
MONO#: 0.5 10*3/uL (ref 0.1–0.9)
NEUT%: 57.9 % (ref 40.0–75.0)
RBC: 2.91 10*6/uL — ABNORMAL LOW (ref 4.20–5.71)
WBC: 7.2 10*3/uL (ref 4.0–10.0)
lymph#: 2.2 10*3/uL (ref 0.9–3.3)

## 2008-01-22 ENCOUNTER — Ambulatory Visit: Payer: Self-pay | Admitting: Oncology

## 2008-01-24 LAB — CBC WITH DIFFERENTIAL/PLATELET
Basophils Absolute: 0 10*3/uL (ref 0.0–0.1)
Eosinophils Absolute: 0.3 10*3/uL (ref 0.0–0.5)
HCT: 28.5 % — ABNORMAL LOW (ref 38.7–49.9)
LYMPH%: 24.4 % (ref 14.0–48.0)
MCV: 107.8 fL — ABNORMAL HIGH (ref 81.6–98.0)
MONO#: 0.5 10*3/uL (ref 0.1–0.9)
MONO%: 8.5 % (ref 0.0–13.0)
NEUT#: 3.9 10*3/uL (ref 1.5–6.5)
NEUT%: 62.5 % (ref 40.0–75.0)
Platelets: 248 10*3/uL (ref 145–400)
RBC: 2.65 10*6/uL — ABNORMAL LOW (ref 4.20–5.71)

## 2008-02-14 LAB — CBC WITH DIFFERENTIAL/PLATELET
Basophils Absolute: 0 10*3/uL (ref 0.0–0.1)
Eosinophils Absolute: 0.3 10*3/uL (ref 0.0–0.5)
HGB: 10.5 g/dL — ABNORMAL LOW (ref 13.0–17.1)
NEUT#: 4.3 10*3/uL (ref 1.5–6.5)
RDW: 23.1 % — ABNORMAL HIGH (ref 11.2–14.6)
WBC: 7.1 10*3/uL (ref 4.0–10.0)
lymph#: 2 10*3/uL (ref 0.9–3.3)

## 2008-03-06 LAB — CBC WITH DIFFERENTIAL/PLATELET
Basophils Absolute: 0.1 10*3/uL (ref 0.0–0.1)
Eosinophils Absolute: 0.2 10*3/uL (ref 0.0–0.5)
HGB: 10.4 g/dL — ABNORMAL LOW (ref 13.0–17.1)
MCV: 109 fL — ABNORMAL HIGH (ref 81.6–98.0)
MONO#: 0.5 10*3/uL (ref 0.1–0.9)
MONO%: 7.3 % (ref 0.0–13.0)
NEUT#: 4.9 10*3/uL (ref 1.5–6.5)
Platelets: 174 10*3/uL (ref 145–400)
RDW: 22.2 % — ABNORMAL HIGH (ref 11.2–14.6)
WBC: 7.4 10*3/uL (ref 4.0–10.0)

## 2008-03-25 ENCOUNTER — Ambulatory Visit: Payer: Self-pay | Admitting: Oncology

## 2008-03-27 LAB — CBC WITH DIFFERENTIAL/PLATELET
BASO%: 0.6 % (ref 0.0–2.0)
EOS%: 4.3 % (ref 0.0–7.0)
MCH: 37 pg — ABNORMAL HIGH (ref 28.0–33.4)
MCV: 109.9 fL — ABNORMAL HIGH (ref 81.6–98.0)
MONO%: 8.2 % (ref 0.0–13.0)
NEUT#: 3.9 10*3/uL (ref 1.5–6.5)
RBC: 2.61 10*6/uL — ABNORMAL LOW (ref 4.20–5.71)
RDW: 22.3 % — ABNORMAL HIGH (ref 11.2–14.6)

## 2008-04-17 LAB — CBC WITH DIFFERENTIAL/PLATELET
BASO%: 0.5 % (ref 0.0–2.0)
EOS%: 2.2 % (ref 0.0–7.0)
MCH: 36.8 pg — ABNORMAL HIGH (ref 28.0–33.4)
MCHC: 33.5 g/dL (ref 32.0–35.9)
NEUT%: 68.4 % (ref 40.0–75.0)
RBC: 2.63 10*6/uL — ABNORMAL LOW (ref 4.20–5.71)
RDW: 22.5 % — ABNORMAL HIGH (ref 11.2–14.6)
lymph#: 1.5 10*3/uL (ref 0.9–3.3)

## 2008-05-07 LAB — CBC & DIFF AND RETIC
Basophils Absolute: 0 10*3/uL (ref 0.0–0.1)
EOS%: 3.7 % (ref 0.0–7.0)
HGB: 10.2 g/dL — ABNORMAL LOW (ref 13.0–17.1)
IRF: 0.15 (ref 0.070–0.380)
MCH: 37.3 pg — ABNORMAL HIGH (ref 28.0–33.4)
NEUT#: 4.6 10*3/uL (ref 1.5–6.5)
RDW: 22.2 % — ABNORMAL HIGH (ref 11.2–14.6)
RETIC #: 8.8 10*3/uL — ABNORMAL LOW (ref 31.8–103.9)
WBC: 7.3 10*3/uL (ref 4.0–10.0)
lymph#: 1.8 10*3/uL (ref 0.9–3.3)

## 2008-05-27 ENCOUNTER — Ambulatory Visit: Payer: Self-pay | Admitting: Oncology

## 2008-05-29 LAB — CBC & DIFF AND RETIC
BASO%: 0.3 % (ref 0.0–2.0)
Eosinophils Absolute: 0.3 10*3/uL (ref 0.0–0.5)
IRF: 0.24 (ref 0.070–0.380)
MCHC: 33.8 g/dL (ref 32.0–35.9)
MONO#: 0.5 10*3/uL (ref 0.1–0.9)
NEUT#: 4.9 10*3/uL (ref 1.5–6.5)
RBC: 2.63 10*6/uL — ABNORMAL LOW (ref 4.20–5.71)
RETIC #: 2.6 10*3/uL — ABNORMAL LOW (ref 31.8–103.9)
Retic %: 0.1 % — ABNORMAL LOW (ref 0.7–2.3)
WBC: 7.3 10*3/uL (ref 4.0–10.0)
lymph#: 1.6 10*3/uL (ref 0.9–3.3)

## 2008-05-29 LAB — TECHNOLOGIST REVIEW

## 2008-06-19 LAB — CBC & DIFF AND RETIC
BASO%: 0.6 % (ref 0.0–2.0)
Eosinophils Absolute: 0.4 10*3/uL (ref 0.0–0.5)
IRF: 0.3 (ref 0.070–0.380)
MCHC: 33.9 g/dL (ref 32.0–35.9)
MONO#: 0.6 10*3/uL (ref 0.1–0.9)
NEUT#: 7.5 10*3/uL — ABNORMAL HIGH (ref 1.5–6.5)
Platelets: 181 10*3/uL (ref 145–400)
RBC: 3.06 10*6/uL — ABNORMAL LOW (ref 4.20–5.71)
RDW: 21.2 % — ABNORMAL HIGH (ref 11.2–14.6)
RETIC #: 9.8 10*3/uL — ABNORMAL LOW (ref 31.8–103.9)
Retic %: 0.3 % — ABNORMAL LOW (ref 0.7–2.3)
WBC: 10.5 10*3/uL — ABNORMAL HIGH (ref 4.0–10.0)
lymph#: 2 10*3/uL (ref 0.9–3.3)

## 2008-07-07 ENCOUNTER — Ambulatory Visit: Payer: Self-pay | Admitting: Oncology

## 2008-07-09 LAB — CBC & DIFF AND RETIC
BASO%: 0.6 % (ref 0.0–2.0)
Eosinophils Absolute: 0.4 10*3/uL (ref 0.0–0.5)
LYMPH%: 26 % (ref 14.0–48.0)
MCHC: 33.5 g/dL (ref 32.0–35.9)
MONO#: 0.6 10*3/uL (ref 0.1–0.9)
NEUT#: 5.1 10*3/uL (ref 1.5–6.5)
RBC: 2.93 10*6/uL — ABNORMAL LOW (ref 4.20–5.71)
RDW: 21.1 % — ABNORMAL HIGH (ref 11.2–14.6)
RETIC #: 11.1 10*3/uL — ABNORMAL LOW (ref 31.8–103.9)
Retic %: 0.4 % — ABNORMAL LOW (ref 0.7–2.3)
WBC: 8.3 10*3/uL (ref 4.0–10.0)

## 2008-08-21 ENCOUNTER — Ambulatory Visit: Payer: Self-pay | Admitting: Oncology

## 2008-09-11 LAB — CBC & DIFF AND RETIC
BASO%: 0.8 % (ref 0.0–2.0)
EOS%: 5.4 % (ref 0.0–7.0)
Eosinophils Absolute: 0.4 10*3/uL (ref 0.0–0.5)
LYMPH%: 28.7 % (ref 14.0–49.0)
MCHC: 33.9 g/dL (ref 32.0–36.0)
MCV: 101.3 fL — ABNORMAL HIGH (ref 79.3–98.0)
MONO%: 9.4 % (ref 0.0–14.0)
NEUT#: 4 10*3/uL (ref 1.5–6.5)
Platelets: 199 10*3/uL (ref 140–400)
RBC: 3 10*6/uL — ABNORMAL LOW (ref 4.20–5.82)
RDW: 19.4 % — ABNORMAL HIGH (ref 11.0–14.6)

## 2008-09-11 LAB — TECHNOLOGIST REVIEW

## 2008-09-11 LAB — RETICULOCYTES (CHCC): ABS Retic: 14.8 10*3/uL — ABNORMAL LOW (ref 19.0–186.0)

## 2008-10-02 LAB — CBC & DIFF AND RETIC
BASO%: 0.8 % (ref 0.0–2.0)
Basophils Absolute: 0.1 10*3/uL (ref 0.0–0.1)
LYMPH%: 25.3 % (ref 14.0–49.0)
MCV: 100.3 fL — ABNORMAL HIGH (ref 79.3–98.0)
MONO#: 0.6 10*3/uL (ref 0.1–0.9)
NEUT#: 4.7 10*3/uL (ref 1.5–6.5)
NEUT%: 61.8 % (ref 39.0–75.0)

## 2008-10-02 LAB — RETICULOCYTES (CHCC)
ABS Retic: 14.3 10*3/uL — ABNORMAL LOW (ref 19.0–186.0)
RBC.: 2.85 MIL/uL — ABNORMAL LOW (ref 4.22–5.81)
Retic Ct Pct: 0.5 % (ref 0.4–3.1)

## 2008-10-21 ENCOUNTER — Ambulatory Visit: Payer: Self-pay | Admitting: Oncology

## 2008-10-23 LAB — CBC & DIFF AND RETIC
BASO%: 0.7 % (ref 0.0–2.0)
Eosinophils Absolute: 0.3 10*3/uL (ref 0.0–0.5)
MCHC: 33.8 g/dL (ref 32.0–36.0)
MONO#: 0.6 10*3/uL (ref 0.1–0.9)
MONO%: 8.4 % (ref 0.0–14.0)
NEUT#: 4 10*3/uL (ref 1.5–6.5)
RBC: 2.67 10*6/uL — ABNORMAL LOW (ref 4.20–5.82)
RDW: 21.6 % — ABNORMAL HIGH (ref 11.0–14.6)
RETIC #: 8.5 10*3/uL — ABNORMAL LOW (ref 31.8–103.9)
Retic %: 0.3 % — ABNORMAL LOW (ref 0.7–2.3)
WBC: 7.1 10*3/uL (ref 4.0–10.3)

## 2008-11-13 LAB — CBC & DIFF AND RETIC
Basophils Absolute: 0.1 10*3/uL (ref 0.0–0.1)
EOS%: 4.3 % (ref 0.0–7.0)
Eosinophils Absolute: 0.4 10*3/uL (ref 0.0–0.5)
HCT: 27.6 % — ABNORMAL LOW (ref 38.4–49.9)
HGB: 9.7 g/dL — ABNORMAL LOW (ref 13.0–17.1)
IRF: 0.3 (ref 0.070–0.380)
MCH: 34.5 pg — ABNORMAL HIGH (ref 27.2–33.4)
MONO#: 0.6 10*3/uL (ref 0.1–0.9)
NEUT#: 5.2 10*3/uL (ref 1.5–6.5)
NEUT%: 62.4 % (ref 39.0–75.0)
RETIC #: 6.7 10*3/uL — ABNORMAL LOW (ref 31.8–103.9)
lymph#: 2.1 10*3/uL (ref 0.9–3.3)

## 2008-11-13 LAB — TECHNOLOGIST REVIEW

## 2008-12-04 LAB — CBC & DIFF AND RETIC
Basophils Absolute: 0 10*3/uL (ref 0.0–0.1)
Eosinophils Absolute: 0.3 10*3/uL (ref 0.0–0.5)
HGB: 9.4 g/dL — ABNORMAL LOW (ref 13.0–17.1)
MONO#: 0.5 10*3/uL (ref 0.1–0.9)
NEUT#: 4.9 10*3/uL (ref 1.5–6.5)
RDW: 19.5 % — ABNORMAL HIGH (ref 11.0–14.6)
RETIC #: 14.7 10*3/uL — ABNORMAL LOW (ref 31.8–103.9)
Retic %: 0.5 % — ABNORMAL LOW (ref 0.7–2.3)
WBC: 7.7 10*3/uL (ref 4.0–10.3)
lymph#: 1.9 10*3/uL (ref 0.9–3.3)
nRBC: 0 % (ref 0–0)

## 2008-12-23 ENCOUNTER — Ambulatory Visit: Payer: Self-pay | Admitting: Oncology

## 2008-12-25 LAB — CBC & DIFF AND RETIC
BASO%: 0.6 % (ref 0.0–2.0)
HCT: 28.2 % — ABNORMAL LOW (ref 38.4–49.9)
IRF: 0.33 (ref 0.070–0.380)
LYMPH%: 24.1 % (ref 14.0–49.0)
MCH: 34.7 pg — ABNORMAL HIGH (ref 27.2–33.4)
MCHC: 34 g/dL (ref 32.0–36.0)
MONO#: 0.7 10*3/uL (ref 0.1–0.9)
NEUT%: 63.2 % (ref 39.0–75.0)
Platelets: 164 10*3/uL (ref 140–400)
WBC: 8.5 10*3/uL (ref 4.0–10.3)

## 2009-01-15 LAB — CBC & DIFF AND RETIC
BASO%: 0.4 % (ref 0.0–2.0)
HCT: 28.1 % — ABNORMAL LOW (ref 38.4–49.9)
MCHC: 34.9 g/dL (ref 32.0–36.0)
MONO#: 0.7 10*3/uL (ref 0.1–0.9)
NEUT#: 7.4 10*3/uL — ABNORMAL HIGH (ref 1.5–6.5)
NEUT%: 69.9 % (ref 39.0–75.0)
RBC: 2.79 10*6/uL — ABNORMAL LOW (ref 4.20–5.82)
WBC: 10.5 10*3/uL — ABNORMAL HIGH (ref 4.0–10.3)
lymph#: 2.1 10*3/uL (ref 0.9–3.3)
nRBC: 0 % (ref 0–0)

## 2009-01-15 LAB — RETICULOCYTES (CHCC): Retic Ct Pct: 0.4 % (ref 0.4–3.1)

## 2009-02-02 ENCOUNTER — Ambulatory Visit: Payer: Self-pay | Admitting: Oncology

## 2009-02-05 LAB — CBC & DIFF AND RETIC
BASO%: 0.4 % (ref 0.0–2.0)
LYMPH%: 29.5 % (ref 14.0–49.0)
MCHC: 34.3 g/dL (ref 32.0–36.0)
MCV: 101.1 fL — ABNORMAL HIGH (ref 79.3–98.0)
MONO%: 6.9 % (ref 0.0–14.0)
Platelets: 162 10*3/uL (ref 140–400)
RBC: 2.71 10*6/uL — ABNORMAL LOW (ref 4.20–5.82)
nRBC: 1 % — ABNORMAL HIGH (ref 0–0)

## 2009-02-26 LAB — CBC & DIFF AND RETIC
Basophils Absolute: 0.1 10*3/uL (ref 0.0–0.1)
EOS%: 3.4 % (ref 0.0–7.0)
HGB: 9.5 g/dL — ABNORMAL LOW (ref 13.0–17.1)
MCH: 35.3 pg — ABNORMAL HIGH (ref 27.2–33.4)
MCV: 102.6 fL — ABNORMAL HIGH (ref 79.3–98.0)
MONO%: 8.7 % (ref 0.0–14.0)
RDW: 19.6 % — ABNORMAL HIGH (ref 11.0–14.6)
Retic Ct Abs: 15.87 10*3/uL — ABNORMAL LOW (ref 24.10–77.50)

## 2009-03-17 ENCOUNTER — Ambulatory Visit: Payer: Self-pay | Admitting: Oncology

## 2009-03-19 LAB — CBC & DIFF AND RETIC
BASO%: 0.2 % (ref 0.0–2.0)
Basophils Absolute: 0 10*3/uL (ref 0.0–0.1)
EOS%: 4.4 % (ref 0.0–7.0)
Eosinophils Absolute: 0.3 10*3/uL (ref 0.0–0.5)
HCT: 28.2 % — ABNORMAL LOW (ref 38.4–49.9)
HGB: 9.6 g/dL — ABNORMAL LOW (ref 13.0–17.1)
LYMPH%: 23.9 % (ref 14.0–49.0)
MCH: 36.8 pg — ABNORMAL HIGH (ref 27.2–33.4)
MCHC: 34.2 g/dL (ref 32.0–36.0)
MCV: 107.7 fL — ABNORMAL HIGH (ref 79.3–98.0)
MONO#: 0.6 10*3/uL (ref 0.1–0.9)
MONO%: 8 % (ref 0.0–14.0)
NEUT#: 4.4 10*3/uL (ref 1.5–6.5)
NEUT%: 63.5 % (ref 39.0–75.0)
Platelets: 157 10*3/uL (ref 140–400)
RBC: 2.62 10*6/uL — ABNORMAL LOW (ref 4.20–5.82)
RDW: 21.4 % — ABNORMAL HIGH (ref 11.0–14.6)
WBC: 6.9 10*3/uL (ref 4.0–10.3)
lymph#: 1.7 10*3/uL (ref 0.9–3.3)

## 2009-04-08 LAB — COMPREHENSIVE METABOLIC PANEL
Albumin: 4.4 g/dL (ref 3.5–5.2)
CO2: 24 mEq/L (ref 19–32)
Chloride: 107 mEq/L (ref 96–112)
Glucose, Bld: 121 mg/dL — ABNORMAL HIGH (ref 70–99)
Potassium: 4.6 mEq/L (ref 3.5–5.3)
Sodium: 140 mEq/L (ref 135–145)
Total Protein: 6.5 g/dL (ref 6.0–8.3)

## 2009-04-08 LAB — CBC & DIFF AND RETIC
Eosinophils Absolute: 0.4 10*3/uL (ref 0.0–0.5)
HCT: 27.4 % — ABNORMAL LOW (ref 38.4–49.9)
LYMPH%: 27.8 % (ref 14.0–49.0)
MONO#: 0.7 10*3/uL (ref 0.1–0.9)
NEUT#: 4.7 10*3/uL (ref 1.5–6.5)
NEUT%: 58.1 % (ref 39.0–75.0)
Platelets: 190 10*3/uL (ref 140–400)
WBC: 8.1 10*3/uL (ref 4.0–10.3)

## 2009-04-08 LAB — FERRITIN: Ferritin: 770 ng/mL — ABNORMAL HIGH (ref 22–322)

## 2009-04-08 LAB — VITAMIN B12: Vitamin B-12: 544 pg/mL (ref 211–911)

## 2009-04-27 ENCOUNTER — Ambulatory Visit: Payer: Self-pay | Admitting: Oncology

## 2009-04-29 LAB — CBC WITH DIFFERENTIAL/PLATELET
BASO%: 0.5 % (ref 0.0–2.0)
HCT: 30.3 % — ABNORMAL LOW (ref 38.4–49.9)
LYMPH%: 28.3 % (ref 14.0–49.0)
MCH: 37.3 pg — ABNORMAL HIGH (ref 27.2–33.4)
MCHC: 34.2 g/dL (ref 32.0–36.0)
MCV: 109.2 fL — ABNORMAL HIGH (ref 79.3–98.0)
MONO%: 7.6 % (ref 0.0–14.0)
NEUT%: 59.3 % (ref 39.0–75.0)
Platelets: 188 10*3/uL (ref 140–400)
RBC: 2.78 10*6/uL — ABNORMAL LOW (ref 4.20–5.82)

## 2009-05-21 ENCOUNTER — Ambulatory Visit: Payer: Self-pay | Admitting: Oncology

## 2009-05-21 LAB — CBC WITH DIFFERENTIAL/PLATELET
Basophils Absolute: 0 10*3/uL (ref 0.0–0.1)
Eosinophils Absolute: 0.2 10*3/uL (ref 0.0–0.5)
HCT: 26.3 % — ABNORMAL LOW (ref 38.4–49.9)
HGB: 8.9 g/dL — ABNORMAL LOW (ref 13.0–17.1)
LYMPH%: 27.5 % (ref 14.0–49.0)
MCV: 104.8 fL — ABNORMAL HIGH (ref 79.3–98.0)
MONO#: 0.5 10*3/uL (ref 0.1–0.9)
MONO%: 8 % (ref 0.0–14.0)
NEUT#: 3.9 10*3/uL (ref 1.5–6.5)
NEUT%: 60.6 % (ref 39.0–75.0)
Platelets: 198 10*3/uL (ref 140–400)
RBC: 2.51 10*6/uL — ABNORMAL LOW (ref 4.20–5.82)

## 2009-06-11 LAB — CBC WITH DIFFERENTIAL/PLATELET
BASO%: 0.7 % (ref 0.0–2.0)
Basophils Absolute: 0.1 10*3/uL (ref 0.0–0.1)
HCT: 28.4 % — ABNORMAL LOW (ref 38.4–49.9)
HGB: 9.6 g/dL — ABNORMAL LOW (ref 13.0–17.1)
MONO#: 0.6 10*3/uL (ref 0.1–0.9)
NEUT%: 58.5 % (ref 39.0–75.0)
WBC: 7.2 10*3/uL (ref 4.0–10.3)
lymph#: 2.1 10*3/uL (ref 0.9–3.3)

## 2009-06-29 ENCOUNTER — Ambulatory Visit: Payer: Self-pay | Admitting: Oncology

## 2009-07-01 LAB — CBC WITH DIFFERENTIAL/PLATELET
Basophils Absolute: 0 10*3/uL (ref 0.0–0.1)
EOS%: 3.6 % (ref 0.0–7.0)
Eosinophils Absolute: 0.3 10*3/uL (ref 0.0–0.5)
HCT: 28.1 % — ABNORMAL LOW (ref 38.4–49.9)
HGB: 9.5 g/dL — ABNORMAL LOW (ref 13.0–17.1)
MCH: 35.7 pg — ABNORMAL HIGH (ref 27.2–33.4)
MCV: 105.6 fL — ABNORMAL HIGH (ref 79.3–98.0)
NEUT#: 5.4 10*3/uL (ref 1.5–6.5)
NEUT%: 63.2 % (ref 39.0–75.0)
lymph#: 2.3 10*3/uL (ref 0.9–3.3)

## 2009-07-23 LAB — CBC WITH DIFFERENTIAL/PLATELET
BASO%: 0.5 % (ref 0.0–2.0)
Basophils Absolute: 0 10*3/uL (ref 0.0–0.1)
EOS%: 4 % (ref 0.0–7.0)
Eosinophils Absolute: 0.3 10*3/uL (ref 0.0–0.5)
HCT: 28.5 % — ABNORMAL LOW (ref 38.4–49.9)
HGB: 9.6 g/dL — ABNORMAL LOW (ref 13.0–17.1)
LYMPH%: 33.3 % (ref 14.0–49.0)
MCH: 35.4 pg — ABNORMAL HIGH (ref 27.2–33.4)
MCHC: 33.7 g/dL (ref 32.0–36.0)
MCV: 105.2 fL — ABNORMAL HIGH (ref 79.3–98.0)
MONO#: 0.5 10*3/uL (ref 0.1–0.9)
MONO%: 6.6 % (ref 0.0–14.0)
NEUT#: 4.2 10*3/uL (ref 1.5–6.5)
NEUT%: 55.6 % (ref 39.0–75.0)
Platelets: 206 10*3/uL (ref 140–400)
RBC: 2.71 10*6/uL — ABNORMAL LOW (ref 4.20–5.82)
RDW: 19 % — ABNORMAL HIGH (ref 11.0–14.6)
WBC: 7.5 10*3/uL (ref 4.0–10.3)
lymph#: 2.5 10*3/uL (ref 0.9–3.3)

## 2009-08-11 ENCOUNTER — Ambulatory Visit: Payer: Self-pay | Admitting: Oncology

## 2009-08-13 LAB — CBC WITH DIFFERENTIAL/PLATELET
BASO%: 0.8 % (ref 0.0–2.0)
Basophils Absolute: 0.1 10*3/uL (ref 0.0–0.1)
EOS%: 3.4 % (ref 0.0–7.0)
Eosinophils Absolute: 0.3 10*3/uL (ref 0.0–0.5)
HCT: 28.5 % — ABNORMAL LOW (ref 38.4–49.9)
HGB: 9.6 g/dL — ABNORMAL LOW (ref 13.0–17.1)
LYMPH%: 30.1 % (ref 14.0–49.0)
MCH: 35.3 pg — ABNORMAL HIGH (ref 27.2–33.4)
MCHC: 33.7 g/dL (ref 32.0–36.0)
MCV: 104.8 fL — ABNORMAL HIGH (ref 79.3–98.0)
MONO#: 0.6 10*3/uL (ref 0.1–0.9)
MONO%: 7.2 % (ref 0.0–14.0)
NEUT#: 4.9 10*3/uL (ref 1.5–6.5)
NEUT%: 58.5 % (ref 39.0–75.0)
Platelets: 184 10*3/uL (ref 140–400)
RBC: 2.72 10*6/uL — ABNORMAL LOW (ref 4.20–5.82)
RDW: 19.2 % — ABNORMAL HIGH (ref 11.0–14.6)
WBC: 8.4 10*3/uL (ref 4.0–10.3)
lymph#: 2.5 10*3/uL (ref 0.9–3.3)
nRBC: 0 % (ref 0–0)

## 2009-09-03 LAB — CBC WITH DIFFERENTIAL/PLATELET
BASO%: 0.6 % (ref 0.0–2.0)
Basophils Absolute: 0.1 10*3/uL (ref 0.0–0.1)
EOS%: 3.2 % (ref 0.0–7.0)
Eosinophils Absolute: 0.3 10*3/uL (ref 0.0–0.5)
HCT: 28.5 % — ABNORMAL LOW (ref 38.4–49.9)
HGB: 9.6 g/dL — ABNORMAL LOW (ref 13.0–17.1)
LYMPH%: 27.7 % (ref 14.0–49.0)
MCH: 35.2 pg — ABNORMAL HIGH (ref 27.2–33.4)
MCHC: 33.7 g/dL (ref 32.0–36.0)
MCV: 104.4 fL — ABNORMAL HIGH (ref 79.3–98.0)
MONO#: 0.6 10*3/uL (ref 0.1–0.9)
MONO%: 7.6 % (ref 0.0–14.0)
NEUT#: 5.1 10*3/uL (ref 1.5–6.5)
NEUT%: 60.9 % (ref 39.0–75.0)
Platelets: 188 10*3/uL (ref 140–400)
RBC: 2.73 10*6/uL — ABNORMAL LOW (ref 4.20–5.82)
RDW: 19.1 % — ABNORMAL HIGH (ref 11.0–14.6)
WBC: 8.4 10*3/uL (ref 4.0–10.3)
lymph#: 2.3 10*3/uL (ref 0.9–3.3)
nRBC: 0 % (ref 0–0)

## 2009-09-22 ENCOUNTER — Ambulatory Visit: Payer: Self-pay | Admitting: Oncology

## 2009-09-24 LAB — CBC WITH DIFFERENTIAL/PLATELET
BASO%: 0.4 % (ref 0.0–2.0)
Basophils Absolute: 0 10*3/uL (ref 0.0–0.1)
EOS%: 3.2 % (ref 0.0–7.0)
Eosinophils Absolute: 0.3 10*3/uL (ref 0.0–0.5)
HCT: 27.9 % — ABNORMAL LOW (ref 38.4–49.9)
HGB: 9.6 g/dL — ABNORMAL LOW (ref 13.0–17.1)
LYMPH%: 27.7 % (ref 14.0–49.0)
MCH: 37.8 pg — ABNORMAL HIGH (ref 27.2–33.4)
MCHC: 34.3 g/dL (ref 32.0–36.0)
MCV: 110.4 fL — ABNORMAL HIGH (ref 79.3–98.0)
MONO#: 0.6 10*3/uL (ref 0.1–0.9)
MONO%: 7.9 % (ref 0.0–14.0)
NEUT#: 4.8 10*3/uL (ref 1.5–6.5)
NEUT%: 60.8 % (ref 39.0–75.0)
Platelets: 207 10*3/uL (ref 140–400)
RBC: 2.53 10*6/uL — ABNORMAL LOW (ref 4.20–5.82)
RDW: 20.8 % — ABNORMAL HIGH (ref 11.0–14.6)
WBC: 7.9 10*3/uL (ref 4.0–10.3)
lymph#: 2.2 10*3/uL (ref 0.9–3.3)

## 2009-10-15 LAB — CBC WITH DIFFERENTIAL/PLATELET
BASO%: 0.7 % (ref 0.0–2.0)
Basophils Absolute: 0 10*3/uL (ref 0.0–0.1)
EOS%: 4.9 % (ref 0.0–7.0)
Eosinophils Absolute: 0.3 10*3/uL (ref 0.0–0.5)
HCT: 25.5 % — ABNORMAL LOW (ref 38.4–49.9)
HGB: 8.6 g/dL — ABNORMAL LOW (ref 13.0–17.1)
LYMPH%: 30.6 % (ref 14.0–49.0)
MCH: 37.6 pg — ABNORMAL HIGH (ref 27.2–33.4)
MCHC: 33.9 g/dL (ref 32.0–36.0)
MCV: 111 fL — ABNORMAL HIGH (ref 79.3–98.0)
MONO#: 0.5 10*3/uL (ref 0.1–0.9)
MONO%: 7.3 % (ref 0.0–14.0)
NEUT#: 3.5 10*3/uL (ref 1.5–6.5)
NEUT%: 56.5 % (ref 39.0–75.0)
Platelets: 187 10*3/uL (ref 140–400)
RBC: 2.29 10*6/uL — ABNORMAL LOW (ref 4.20–5.82)
RDW: 20.8 % — ABNORMAL HIGH (ref 11.0–14.6)
WBC: 6.2 10*3/uL (ref 4.0–10.3)
lymph#: 1.9 10*3/uL (ref 0.9–3.3)

## 2009-10-15 LAB — TECHNOLOGIST REVIEW

## 2009-10-21 LAB — CBC WITH DIFFERENTIAL/PLATELET
BASO%: 0.5 % (ref 0.0–2.0)
Basophils Absolute: 0 10e3/uL (ref 0.0–0.1)
EOS%: 3.4 % (ref 0.0–7.0)
Eosinophils Absolute: 0.3 10e3/uL (ref 0.0–0.5)
HCT: 26.6 % — ABNORMAL LOW (ref 38.4–49.9)
HGB: 9.1 g/dL — ABNORMAL LOW (ref 13.0–17.1)
LYMPH%: 29.3 % (ref 14.0–49.0)
MCH: 37.5 pg — ABNORMAL HIGH (ref 27.2–33.4)
MCHC: 34.1 g/dL (ref 32.0–36.0)
MCV: 110.2 fL — ABNORMAL HIGH (ref 79.3–98.0)
MONO#: 0.6 10e3/uL (ref 0.1–0.9)
MONO%: 6.3 % (ref 0.0–14.0)
NEUT#: 5.4 10e3/uL (ref 1.5–6.5)
NEUT%: 60.5 % (ref 39.0–75.0)
Platelets: 216 10e3/uL (ref 140–400)
RBC: 2.41 10e6/uL — ABNORMAL LOW (ref 4.20–5.82)
RDW: 20.9 % — ABNORMAL HIGH (ref 11.0–14.6)
WBC: 8.9 10e3/uL (ref 4.0–10.3)
lymph#: 2.6 10e3/uL (ref 0.9–3.3)

## 2009-11-04 ENCOUNTER — Ambulatory Visit: Payer: Self-pay | Admitting: Oncology

## 2009-11-05 LAB — CBC WITH DIFFERENTIAL/PLATELET
BASO%: 0.8 % (ref 0.0–2.0)
Basophils Absolute: 0.1 10*3/uL (ref 0.0–0.1)
EOS%: 3.4 % (ref 0.0–7.0)
Eosinophils Absolute: 0.3 10*3/uL (ref 0.0–0.5)
HCT: 25.6 % — ABNORMAL LOW (ref 38.4–49.9)
HGB: 8.9 g/dL — ABNORMAL LOW (ref 13.0–17.1)
LYMPH%: 25.2 % (ref 14.0–49.0)
MCH: 38.9 pg — ABNORMAL HIGH (ref 27.2–33.4)
MCHC: 34.9 g/dL (ref 32.0–36.0)
MCV: 111.6 fL — ABNORMAL HIGH (ref 79.3–98.0)
MONO#: 0.6 10*3/uL (ref 0.1–0.9)
MONO%: 6.9 % (ref 0.0–14.0)
NEUT#: 5.3 10*3/uL (ref 1.5–6.5)
NEUT%: 63.7 % (ref 39.0–75.0)
Platelets: 175 10*3/uL (ref 140–400)
RBC: 2.29 10*6/uL — ABNORMAL LOW (ref 4.20–5.82)
RDW: 20.8 % — ABNORMAL HIGH (ref 11.0–14.6)
WBC: 8.3 10*3/uL (ref 4.0–10.3)
lymph#: 2.1 10*3/uL (ref 0.9–3.3)

## 2009-11-26 LAB — CBC WITH DIFFERENTIAL/PLATELET
BASO%: 0.1 % (ref 0.0–2.0)
Basophils Absolute: 0 10*3/uL (ref 0.0–0.1)
EOS%: 2.7 % (ref 0.0–7.0)
Eosinophils Absolute: 0.3 10*3/uL (ref 0.0–0.5)
HCT: 26.9 % — ABNORMAL LOW (ref 38.4–49.9)
HGB: 9.3 g/dL — ABNORMAL LOW (ref 13.0–17.1)
LYMPH%: 24.9 % (ref 14.0–49.0)
MCH: 36.8 pg — ABNORMAL HIGH (ref 27.2–33.4)
MCHC: 34.4 g/dL (ref 32.0–36.0)
MCV: 107.1 fL — ABNORMAL HIGH (ref 79.3–98.0)
MONO#: 0.6 10*3/uL (ref 0.1–0.9)
MONO%: 6.2 % (ref 0.0–14.0)
NEUT#: 6.2 10*3/uL (ref 1.5–6.5)
NEUT%: 66.1 % (ref 39.0–75.0)
Platelets: 230 10*3/uL (ref 140–400)
RBC: 2.52 10*6/uL — ABNORMAL LOW (ref 4.20–5.82)
RDW: 21.6 % — ABNORMAL HIGH (ref 11.0–14.6)
WBC: 9.4 10*3/uL (ref 4.0–10.3)
lymph#: 2.3 10*3/uL (ref 0.9–3.3)

## 2009-12-15 ENCOUNTER — Ambulatory Visit: Payer: Self-pay | Admitting: Oncology

## 2009-12-17 LAB — CBC WITH DIFFERENTIAL/PLATELET
BASO%: 0.6 % (ref 0.0–2.0)
Basophils Absolute: 0 10*3/uL (ref 0.0–0.1)
EOS%: 4.6 % (ref 0.0–7.0)
Eosinophils Absolute: 0.3 10*3/uL (ref 0.0–0.5)
HCT: 25.2 % — ABNORMAL LOW (ref 38.4–49.9)
HGB: 8.5 g/dL — ABNORMAL LOW (ref 13.0–17.1)
LYMPH%: 29.7 % (ref 14.0–49.0)
MCH: 34.3 pg — ABNORMAL HIGH (ref 27.2–33.4)
MCHC: 33.7 g/dL (ref 32.0–36.0)
MCV: 101.6 fL — ABNORMAL HIGH (ref 79.3–98.0)
MONO#: 0.5 10*3/uL (ref 0.1–0.9)
MONO%: 7.3 % (ref 0.0–14.0)
NEUT#: 4.1 10*3/uL (ref 1.5–6.5)
NEUT%: 57.8 % (ref 39.0–75.0)
Platelets: 150 10*3/uL (ref 140–400)
RBC: 2.48 10*6/uL — ABNORMAL LOW (ref 4.20–5.82)
RDW: 19.8 % — ABNORMAL HIGH (ref 11.0–14.6)
WBC: 7.1 10*3/uL (ref 4.0–10.3)
lymph#: 2.1 10*3/uL (ref 0.9–3.3)
nRBC: 0 % (ref 0–0)

## 2009-12-21 LAB — CBC WITH DIFFERENTIAL/PLATELET
BASO%: 0.5 % (ref 0.0–2.0)
Basophils Absolute: 0 10*3/uL (ref 0.0–0.1)
EOS%: 2.5 % (ref 0.0–7.0)
Eosinophils Absolute: 0.2 10*3/uL (ref 0.0–0.5)
HCT: 26.4 % — ABNORMAL LOW (ref 38.4–49.9)
HGB: 8.9 g/dL — ABNORMAL LOW (ref 13.0–17.1)
LYMPH%: 26.7 % (ref 14.0–49.0)
MCH: 34.5 pg — ABNORMAL HIGH (ref 27.2–33.4)
MCHC: 33.7 g/dL (ref 32.0–36.0)
MCV: 102.3 fL — ABNORMAL HIGH (ref 79.3–98.0)
MONO#: 0.6 10*3/uL (ref 0.1–0.9)
MONO%: 6.7 % (ref 0.0–14.0)
NEUT#: 5.6 10*3/uL (ref 1.5–6.5)
NEUT%: 63.6 % (ref 39.0–75.0)
Platelets: 163 10*3/uL (ref 140–400)
RBC: 2.58 10*6/uL — ABNORMAL LOW (ref 4.20–5.82)
RDW: 20.1 % — ABNORMAL HIGH (ref 11.0–14.6)
WBC: 8.8 10*3/uL (ref 4.0–10.3)
lymph#: 2.4 10*3/uL (ref 0.9–3.3)
nRBC: 1 % — ABNORMAL HIGH (ref 0–0)

## 2009-12-21 LAB — HOLD TUBE, BLOOD BANK

## 2010-01-07 LAB — CBC WITH DIFFERENTIAL/PLATELET
BASO%: 0.5 % (ref 0.0–2.0)
Basophils Absolute: 0 10*3/uL (ref 0.0–0.1)
EOS%: 3.8 % (ref 0.0–7.0)
Eosinophils Absolute: 0.3 10*3/uL (ref 0.0–0.5)
HCT: 28.2 % — ABNORMAL LOW (ref 38.4–49.9)
HGB: 9.6 g/dL — ABNORMAL LOW (ref 13.0–17.1)
LYMPH%: 24 % (ref 14.0–49.0)
MCH: 36.4 pg — ABNORMAL HIGH (ref 27.2–33.4)
MCHC: 34 g/dL (ref 32.0–36.0)
MCV: 106.9 fL — ABNORMAL HIGH (ref 79.3–98.0)
MONO#: 0.6 10*3/uL (ref 0.1–0.9)
MONO%: 7.8 % (ref 0.0–14.0)
NEUT#: 4.5 10*3/uL (ref 1.5–6.5)
NEUT%: 63.9 % (ref 39.0–75.0)
Platelets: 182 10*3/uL (ref 140–400)
RBC: 2.64 10*6/uL — ABNORMAL LOW (ref 4.20–5.82)
RDW: 22.4 % — ABNORMAL HIGH (ref 11.0–14.6)
WBC: 7.1 10*3/uL (ref 4.0–10.3)
lymph#: 1.7 10*3/uL (ref 0.9–3.3)

## 2010-01-26 ENCOUNTER — Ambulatory Visit: Payer: Self-pay | Admitting: Oncology

## 2010-01-28 LAB — CBC WITH DIFFERENTIAL/PLATELET
BASO%: 0.5 % (ref 0.0–2.0)
Basophils Absolute: 0 10*3/uL (ref 0.0–0.1)
EOS%: 4.7 % (ref 0.0–7.0)
Eosinophils Absolute: 0.4 10*3/uL (ref 0.0–0.5)
HCT: 29.6 % — ABNORMAL LOW (ref 38.4–49.9)
HGB: 10.1 g/dL — ABNORMAL LOW (ref 13.0–17.1)
LYMPH%: 27.1 % (ref 14.0–49.0)
MCH: 36.3 pg — ABNORMAL HIGH (ref 27.2–33.4)
MCHC: 34 g/dL (ref 32.0–36.0)
MCV: 106.5 fL — ABNORMAL HIGH (ref 79.3–98.0)
MONO#: 0.6 10*3/uL (ref 0.1–0.9)
MONO%: 6.9 % (ref 0.0–14.0)
NEUT#: 4.9 10*3/uL (ref 1.5–6.5)
NEUT%: 60.8 % (ref 39.0–75.0)
Platelets: 197 10*3/uL (ref 140–400)
RBC: 2.78 10*6/uL — ABNORMAL LOW (ref 4.20–5.82)
RDW: 22.5 % — ABNORMAL HIGH (ref 11.0–14.6)
WBC: 8 10*3/uL (ref 4.0–10.3)
lymph#: 2.2 10*3/uL (ref 0.9–3.3)

## 2010-02-18 LAB — CBC WITH DIFFERENTIAL/PLATELET
BASO%: 0.6 % (ref 0.0–2.0)
Basophils Absolute: 0 10*3/uL (ref 0.0–0.1)
EOS%: 4.5 % (ref 0.0–7.0)
Eosinophils Absolute: 0.3 10*3/uL (ref 0.0–0.5)
HCT: 28 % — ABNORMAL LOW (ref 38.4–49.9)
HGB: 9.7 g/dL — ABNORMAL LOW (ref 13.0–17.1)
LYMPH%: 28.3 % (ref 14.0–49.0)
MCH: 36.9 pg — ABNORMAL HIGH (ref 27.2–33.4)
MCHC: 34.8 g/dL (ref 32.0–36.0)
MCV: 106 fL — ABNORMAL HIGH (ref 79.3–98.0)
MONO#: 0.5 10*3/uL (ref 0.1–0.9)
MONO%: 6.8 % (ref 0.0–14.0)
NEUT#: 4.6 10*3/uL (ref 1.5–6.5)
NEUT%: 59.8 % (ref 39.0–75.0)
Platelets: 184 10*3/uL (ref 140–400)
RBC: 2.64 10*6/uL — ABNORMAL LOW (ref 4.20–5.82)
RDW: 22.1 % — ABNORMAL HIGH (ref 11.0–14.6)
WBC: 7.6 10*3/uL (ref 4.0–10.3)
lymph#: 2.2 10*3/uL (ref 0.9–3.3)

## 2010-03-09 ENCOUNTER — Ambulatory Visit: Payer: Self-pay | Admitting: Oncology

## 2010-03-11 LAB — CBC WITH DIFFERENTIAL/PLATELET
BASO%: 0.4 % (ref 0.0–2.0)
Basophils Absolute: 0 10*3/uL (ref 0.0–0.1)
EOS%: 4.8 % (ref 0.0–7.0)
Eosinophils Absolute: 0.4 10*3/uL (ref 0.0–0.5)
HCT: 28 % — ABNORMAL LOW (ref 38.4–49.9)
HGB: 9.3 g/dL — ABNORMAL LOW (ref 13.0–17.1)
LYMPH%: 29.4 % (ref 14.0–49.0)
MCH: 34.8 pg — ABNORMAL HIGH (ref 27.2–33.4)
MCHC: 33.2 g/dL (ref 32.0–36.0)
MCV: 104.9 fL — ABNORMAL HIGH (ref 79.3–98.0)
MONO#: 0.7 10*3/uL (ref 0.1–0.9)
MONO%: 8.2 % (ref 0.0–14.0)
NEUT#: 4.5 10*3/uL (ref 1.5–6.5)
NEUT%: 57.2 % (ref 39.0–75.0)
Platelets: 209 10*3/uL (ref 140–400)
RBC: 2.67 10*6/uL — ABNORMAL LOW (ref 4.20–5.82)
RDW: 20 % — ABNORMAL HIGH (ref 11.0–14.6)
WBC: 7.9 10*3/uL (ref 4.0–10.3)
lymph#: 2.3 10*3/uL (ref 0.9–3.3)

## 2010-04-01 LAB — CBC WITH DIFFERENTIAL/PLATELET
BASO%: 0.8 % (ref 0.0–2.0)
Basophils Absolute: 0.1 10*3/uL (ref 0.0–0.1)
EOS%: 4.4 % (ref 0.0–7.0)
Eosinophils Absolute: 0.3 10*3/uL (ref 0.0–0.5)
HCT: 29 % — ABNORMAL LOW (ref 38.4–49.9)
HGB: 9.7 g/dL — ABNORMAL LOW (ref 13.0–17.1)
LYMPH%: 29 % (ref 14.0–49.0)
MCH: 36.3 pg — ABNORMAL HIGH (ref 27.2–33.4)
MCHC: 33.5 g/dL (ref 32.0–36.0)
MCV: 108.4 fL — ABNORMAL HIGH (ref 79.3–98.0)
MONO#: 0.5 10*3/uL (ref 0.1–0.9)
MONO%: 7.2 % (ref 0.0–14.0)
NEUT#: 4.4 10*3/uL (ref 1.5–6.5)
NEUT%: 58.6 % (ref 39.0–75.0)
Platelets: 178 10*3/uL (ref 140–400)
RBC: 2.68 10*6/uL — ABNORMAL LOW (ref 4.20–5.82)
RDW: 21.3 % — ABNORMAL HIGH (ref 11.0–14.6)
WBC: 7.5 10*3/uL (ref 4.0–10.3)
lymph#: 2.2 10*3/uL (ref 0.9–3.3)

## 2010-04-20 ENCOUNTER — Ambulatory Visit: Payer: Self-pay | Admitting: Oncology

## 2010-04-22 LAB — CBC WITH DIFFERENTIAL/PLATELET
BASO%: 0.9 % (ref 0.0–2.0)
Basophils Absolute: 0.1 10*3/uL (ref 0.0–0.1)
EOS%: 2.3 % (ref 0.0–7.0)
Eosinophils Absolute: 0.2 10*3/uL (ref 0.0–0.5)
HCT: 27.6 % — ABNORMAL LOW (ref 38.4–49.9)
HGB: 9.4 g/dL — ABNORMAL LOW (ref 13.0–17.1)
LYMPH%: 26.6 % (ref 14.0–49.0)
MCH: 36.6 pg — ABNORMAL HIGH (ref 27.2–33.4)
MCHC: 34 g/dL (ref 32.0–36.0)
MCV: 107.7 fL — ABNORMAL HIGH (ref 79.3–98.0)
MONO#: 0.5 10*3/uL (ref 0.1–0.9)
MONO%: 7.3 % (ref 0.0–14.0)
NEUT#: 4.4 10*3/uL (ref 1.5–6.5)
NEUT%: 62.9 % (ref 39.0–75.0)
Platelets: 198 10*3/uL (ref 140–400)
RBC: 2.56 10*6/uL — ABNORMAL LOW (ref 4.20–5.82)
RDW: 21 % — ABNORMAL HIGH (ref 11.0–14.6)
WBC: 6.9 10*3/uL (ref 4.0–10.3)
lymph#: 1.8 10*3/uL (ref 0.9–3.3)

## 2010-05-10 LAB — CBC WITH DIFFERENTIAL/PLATELET
BASO%: 0.6 % (ref 0.0–2.0)
Basophils Absolute: 0.1 10*3/uL (ref 0.0–0.1)
EOS%: 3.3 % (ref 0.0–7.0)
Eosinophils Absolute: 0.3 10*3/uL (ref 0.0–0.5)
HCT: 27.4 % — ABNORMAL LOW (ref 38.4–49.9)
HGB: 9.2 g/dL — ABNORMAL LOW (ref 13.0–17.1)
LYMPH%: 33.5 % (ref 14.0–49.0)
MCH: 35.1 pg — ABNORMAL HIGH (ref 27.2–33.4)
MCHC: 33.6 g/dL (ref 32.0–36.0)
MCV: 104.6 fL — ABNORMAL HIGH (ref 79.3–98.0)
MONO#: 0.8 10*3/uL (ref 0.1–0.9)
MONO%: 9 % (ref 0.0–14.0)
NEUT#: 4.6 10*3/uL (ref 1.5–6.5)
NEUT%: 53.6 % (ref 39.0–75.0)
Platelets: 198 10*3/uL (ref 140–400)
RBC: 2.62 10*6/uL — ABNORMAL LOW (ref 4.20–5.82)
RDW: 19.1 % — ABNORMAL HIGH (ref 11.0–14.6)
WBC: 8.5 10*3/uL (ref 4.0–10.3)
lymph#: 2.9 10*3/uL (ref 0.9–3.3)
nRBC: 1 % — ABNORMAL HIGH (ref 0–0)

## 2010-05-31 ENCOUNTER — Ambulatory Visit: Payer: Self-pay | Admitting: Oncology

## 2010-06-03 LAB — CBC WITH DIFFERENTIAL/PLATELET
BASO%: 0.3 % (ref 0.0–2.0)
Basophils Absolute: 0 10*3/uL (ref 0.0–0.1)
EOS%: 3.2 % (ref 0.0–7.0)
Eosinophils Absolute: 0.2 10*3/uL (ref 0.0–0.5)
HCT: 28.8 % — ABNORMAL LOW (ref 38.4–49.9)
HGB: 9.6 g/dL — ABNORMAL LOW (ref 13.0–17.1)
LYMPH%: 27.8 % (ref 14.0–49.0)
MCH: 36.6 pg — ABNORMAL HIGH (ref 27.2–33.4)
MCHC: 33.4 g/dL (ref 32.0–36.0)
MCV: 109.5 fL — ABNORMAL HIGH (ref 79.3–98.0)
MONO#: 0.5 10*3/uL (ref 0.1–0.9)
MONO%: 7.3 % (ref 0.0–14.0)
NEUT#: 4.3 10*3/uL (ref 1.5–6.5)
NEUT%: 61.4 % (ref 39.0–75.0)
Platelets: 187 10*3/uL (ref 140–400)
RBC: 2.63 10*6/uL — ABNORMAL LOW (ref 4.20–5.82)
RDW: 20.6 % — ABNORMAL HIGH (ref 11.0–14.6)
WBC: 7 10*3/uL (ref 4.0–10.3)
lymph#: 2 10*3/uL (ref 0.9–3.3)

## 2010-06-23 LAB — CBC & DIFF AND RETIC
BASO%: 0.4 % (ref 0.0–2.0)
Basophils Absolute: 0 10*3/uL (ref 0.0–0.1)
EOS%: 4.5 % (ref 0.0–7.0)
Eosinophils Absolute: 0.3 10*3/uL (ref 0.0–0.5)
HCT: 29.1 % — ABNORMAL LOW (ref 38.4–49.9)
HGB: 9.6 g/dL — ABNORMAL LOW (ref 13.0–17.1)
Immature Retic Fract: 8 % (ref 0.00–13.40)
LYMPH%: 27.8 % (ref 14.0–49.0)
MCH: 34.3 pg — ABNORMAL HIGH (ref 27.2–33.4)
MCHC: 33 g/dL (ref 32.0–36.0)
MCV: 103.9 fL — ABNORMAL HIGH (ref 79.3–98.0)
MONO#: 0.6 10*3/uL (ref 0.1–0.9)
MONO%: 8.3 % (ref 0.0–14.0)
NEUT#: 4.4 10*3/uL (ref 1.5–6.5)
NEUT%: 59 % (ref 39.0–75.0)
Platelets: 172 10*3/uL (ref 140–400)
RBC: 2.8 10*6/uL — ABNORMAL LOW (ref 4.20–5.82)
RDW: 18.3 % — ABNORMAL HIGH (ref 11.0–14.6)
Retic %: 0.59 % (ref 0.50–1.60)
Retic Ct Abs: 16.52 10*3/uL — ABNORMAL LOW (ref 24.10–77.50)
WBC: 7.4 10*3/uL (ref 4.0–10.3)
lymph#: 2.1 10*3/uL (ref 0.9–3.3)
nRBC: 0 % (ref 0–0)

## 2010-06-23 LAB — FERRITIN: Ferritin: 801 ng/mL — ABNORMAL HIGH (ref 22–322)

## 2010-07-14 ENCOUNTER — Ambulatory Visit: Payer: Self-pay | Admitting: Oncology

## 2010-07-14 LAB — CBC WITH DIFFERENTIAL/PLATELET
BASO%: 0.6 % (ref 0.0–2.0)
Basophils Absolute: 0 10*3/uL (ref 0.0–0.1)
EOS%: 4.3 % (ref 0.0–7.0)
Eosinophils Absolute: 0.3 10*3/uL (ref 0.0–0.5)
HCT: 28.8 % — ABNORMAL LOW (ref 38.4–49.9)
HGB: 9.8 g/dL — ABNORMAL LOW (ref 13.0–17.1)
LYMPH%: 31.2 % (ref 14.0–49.0)
MCH: 35.3 pg — ABNORMAL HIGH (ref 27.2–33.4)
MCHC: 34 g/dL (ref 32.0–36.0)
MCV: 103.6 fL — ABNORMAL HIGH (ref 79.3–98.0)
MONO#: 0.6 10*3/uL (ref 0.1–0.9)
MONO%: 8.4 % (ref 0.0–14.0)
NEUT#: 3.9 10*3/uL (ref 1.5–6.5)
NEUT%: 55.5 % (ref 39.0–75.0)
Platelets: 215 10*3/uL (ref 140–400)
RBC: 2.78 10*6/uL — ABNORMAL LOW (ref 4.20–5.82)
RDW: 18.9 % — ABNORMAL HIGH (ref 11.0–14.6)
WBC: 7.1 10*3/uL (ref 4.0–10.3)
lymph#: 2.2 10*3/uL (ref 0.9–3.3)

## 2010-08-04 LAB — CBC & DIFF AND RETIC
BASO%: 0.5 % (ref 0.0–2.0)
Basophils Absolute: 0 10*3/uL (ref 0.0–0.1)
EOS%: 4.3 % (ref 0.0–7.0)
Eosinophils Absolute: 0.3 10*3/uL (ref 0.0–0.5)
HCT: 28.8 % — ABNORMAL LOW (ref 38.4–49.9)
HGB: 9.7 g/dL — ABNORMAL LOW (ref 13.0–17.1)
Immature Retic Fract: 8.4 % (ref 0.00–13.40)
LYMPH%: 30.4 % (ref 14.0–49.0)
MCH: 35.1 pg — ABNORMAL HIGH (ref 27.2–33.4)
MCHC: 33.7 g/dL (ref 32.0–36.0)
MCV: 104.3 fL — ABNORMAL HIGH (ref 79.3–98.0)
MONO#: 0.5 10*3/uL (ref 0.1–0.9)
MONO%: 7.1 % (ref 0.0–14.0)
NEUT#: 4.2 10*3/uL (ref 1.5–6.5)
NEUT%: 57.7 % (ref 39.0–75.0)
Platelets: 164 10*3/uL (ref 140–400)
RBC: 2.76 10*6/uL — ABNORMAL LOW (ref 4.20–5.82)
RDW: 18.3 % — ABNORMAL HIGH (ref 11.0–14.6)
Retic %: 0.56 % (ref 0.50–1.60)
Retic Ct Abs: 15.46 10*3/uL — ABNORMAL LOW (ref 24.10–77.50)
WBC: 7.3 10*3/uL (ref 4.0–10.3)
lymph#: 2.2 10*3/uL (ref 0.9–3.3)
nRBC: 0 % (ref 0–0)

## 2010-08-04 LAB — FERRITIN: Ferritin: 981 ng/mL — ABNORMAL HIGH (ref 22–322)

## 2010-08-25 ENCOUNTER — Other Ambulatory Visit: Payer: Self-pay | Admitting: Oncology

## 2010-08-25 ENCOUNTER — Encounter (HOSPITAL_BASED_OUTPATIENT_CLINIC_OR_DEPARTMENT_OTHER): Payer: Medicare Other | Admitting: Oncology

## 2010-08-25 DIAGNOSIS — D4620 Refractory anemia with excess of blasts, unspecified: Secondary | ICD-10-CM

## 2010-08-25 LAB — CBC WITH DIFFERENTIAL/PLATELET
Basophils Absolute: 0 10*3/uL (ref 0.0–0.1)
EOS%: 3.8 % (ref 0.0–7.0)
Eosinophils Absolute: 0.3 10*3/uL (ref 0.0–0.5)
HCT: 27.7 % — ABNORMAL LOW (ref 38.4–49.9)
HGB: 9.4 g/dL — ABNORMAL LOW (ref 13.0–17.1)
LYMPH%: 27.8 % (ref 14.0–49.0)
MCH: 36.7 pg — ABNORMAL HIGH (ref 27.2–33.4)
MCHC: 33.8 g/dL (ref 32.0–36.0)
MCV: 108.5 fL — ABNORMAL HIGH (ref 79.3–98.0)
MONO#: 0.6 10*3/uL (ref 0.1–0.9)
MONO%: 9 % (ref 0.0–14.0)
NEUT#: 4 10*3/uL (ref 1.5–6.5)
NEUT%: 58.8 % (ref 39.0–75.0)
RBC: 2.55 10*6/uL — ABNORMAL LOW (ref 4.20–5.82)
RDW: 20.1 % — ABNORMAL HIGH (ref 11.0–14.6)
WBC: 6.9 10*3/uL (ref 4.0–10.3)
lymph#: 1.9 10*3/uL (ref 0.9–3.3)

## 2010-09-15 ENCOUNTER — Other Ambulatory Visit: Payer: Self-pay | Admitting: Oncology

## 2010-09-15 ENCOUNTER — Encounter (HOSPITAL_BASED_OUTPATIENT_CLINIC_OR_DEPARTMENT_OTHER): Payer: Medicare Other | Admitting: Oncology

## 2010-09-15 DIAGNOSIS — D649 Anemia, unspecified: Secondary | ICD-10-CM

## 2010-09-15 DIAGNOSIS — D4620 Refractory anemia with excess of blasts, unspecified: Secondary | ICD-10-CM

## 2010-09-15 LAB — CBC & DIFF AND RETIC
BASO%: 0.4 % (ref 0.0–2.0)
Basophils Absolute: 0 10*3/uL (ref 0.0–0.1)
Eosinophils Absolute: 0.3 10*3/uL (ref 0.0–0.5)
HCT: 28.7 % — ABNORMAL LOW (ref 38.4–49.9)
HGB: 9.8 g/dL — ABNORMAL LOW (ref 13.0–17.1)
Immature Retic Fract: 6.2 % (ref 0.00–13.40)
LYMPH%: 29.9 % (ref 14.0–49.0)
MCV: 103.6 fL — ABNORMAL HIGH (ref 79.3–98.0)
MONO#: 0.5 10*3/uL (ref 0.1–0.9)
MONO%: 7.5 % (ref 0.0–14.0)
NEUT#: 4 10*3/uL (ref 1.5–6.5)
NEUT%: 57.6 % (ref 39.0–75.0)
Platelets: 149 10*3/uL (ref 140–400)
RDW: 18.8 % — ABNORMAL HIGH (ref 11.0–14.6)
Retic %: 0.43 % — ABNORMAL LOW (ref 0.50–1.60)
Retic Ct Abs: 11.91 10*3/uL — ABNORMAL LOW (ref 24.10–77.50)
WBC: 6.9 10*3/uL (ref 4.0–10.3)
lymph#: 2.1 10*3/uL (ref 0.9–3.3)

## 2010-09-15 LAB — FERRITIN: Ferritin: 860 ng/mL — ABNORMAL HIGH (ref 22–322)

## 2010-10-06 ENCOUNTER — Encounter (HOSPITAL_BASED_OUTPATIENT_CLINIC_OR_DEPARTMENT_OTHER): Payer: Medicare Other | Admitting: Oncology

## 2010-10-06 ENCOUNTER — Other Ambulatory Visit: Payer: Self-pay | Admitting: Oncology

## 2010-10-06 DIAGNOSIS — D4620 Refractory anemia with excess of blasts, unspecified: Secondary | ICD-10-CM

## 2010-10-06 LAB — CBC WITH DIFFERENTIAL/PLATELET
BASO%: 0.3 % (ref 0.0–2.0)
Basophils Absolute: 0 10*3/uL (ref 0.0–0.1)
EOS%: 3.8 % (ref 0.0–7.0)
Eosinophils Absolute: 0.3 10*3/uL (ref 0.0–0.5)
HCT: 28.1 % — ABNORMAL LOW (ref 38.4–49.9)
HGB: 9.4 g/dL — ABNORMAL LOW (ref 13.0–17.1)
MCH: 36.2 pg — ABNORMAL HIGH (ref 27.2–33.4)
MCV: 108.1 fL — ABNORMAL HIGH (ref 79.3–98.0)
MONO#: 0.5 10*3/uL (ref 0.1–0.9)
MONO%: 6.7 % (ref 0.0–14.0)
NEUT#: 4.8 10*3/uL (ref 1.5–6.5)
NEUT%: 62.6 % (ref 39.0–75.0)
Platelets: 190 10*3/uL (ref 140–400)
RBC: 2.6 10*6/uL — ABNORMAL LOW (ref 4.20–5.82)
RDW: 20.5 % — ABNORMAL HIGH (ref 11.0–14.6)
WBC: 7.7 10*3/uL (ref 4.0–10.3)
lymph#: 2 10*3/uL (ref 0.9–3.3)

## 2010-10-27 ENCOUNTER — Other Ambulatory Visit: Payer: Self-pay | Admitting: Oncology

## 2010-10-27 ENCOUNTER — Encounter (HOSPITAL_BASED_OUTPATIENT_CLINIC_OR_DEPARTMENT_OTHER): Payer: Medicare Other | Admitting: Oncology

## 2010-10-27 DIAGNOSIS — D649 Anemia, unspecified: Secondary | ICD-10-CM

## 2010-10-27 DIAGNOSIS — N189 Chronic kidney disease, unspecified: Secondary | ICD-10-CM

## 2010-10-27 DIAGNOSIS — N039 Chronic nephritic syndrome with unspecified morphologic changes: Secondary | ICD-10-CM

## 2010-10-27 DIAGNOSIS — N19 Unspecified kidney failure: Secondary | ICD-10-CM

## 2010-10-27 DIAGNOSIS — D4620 Refractory anemia with excess of blasts, unspecified: Secondary | ICD-10-CM

## 2010-10-27 LAB — CBC & DIFF AND RETIC
BASO%: 0.5 % (ref 0.0–2.0)
Basophils Absolute: 0 10*3/uL (ref 0.0–0.1)
EOS%: 4.2 % (ref 0.0–7.0)
Eosinophils Absolute: 0.3 10*3/uL (ref 0.0–0.5)
HGB: 9.3 g/dL — ABNORMAL LOW (ref 13.0–17.1)
Immature Retic Fract: 5.3 % (ref 0.00–13.40)
LYMPH%: 30 % (ref 14.0–49.0)
MCHC: 33.8 g/dL (ref 32.0–36.0)
MCV: 103.4 fL — ABNORMAL HIGH (ref 79.3–98.0)
MONO#: 0.6 10*3/uL (ref 0.1–0.9)
NEUT#: 4.4 10*3/uL (ref 1.5–6.5)
NEUT%: 58 % (ref 39.0–75.0)
Platelets: 190 10*3/uL (ref 140–400)
RBC: 2.66 10*6/uL — ABNORMAL LOW (ref 4.20–5.82)
RDW: 19.1 % — ABNORMAL HIGH (ref 11.0–14.6)
Retic %: 0.41 % — ABNORMAL LOW (ref 0.50–1.60)
Retic Ct Abs: 10.91 10*3/uL — ABNORMAL LOW (ref 24.10–77.50)
WBC: 7.5 10*3/uL (ref 4.0–10.3)
lymph#: 2.3 10*3/uL (ref 0.9–3.3)
nRBC: 0 % (ref 0–0)

## 2010-10-27 LAB — FERRITIN: Ferritin: 973 ng/mL — ABNORMAL HIGH (ref 22–322)

## 2010-11-17 ENCOUNTER — Other Ambulatory Visit: Payer: Self-pay | Admitting: Oncology

## 2010-11-17 ENCOUNTER — Encounter (HOSPITAL_BASED_OUTPATIENT_CLINIC_OR_DEPARTMENT_OTHER): Payer: Medicare Other | Admitting: Oncology

## 2010-11-17 DIAGNOSIS — N19 Unspecified kidney failure: Secondary | ICD-10-CM

## 2010-11-17 DIAGNOSIS — D649 Anemia, unspecified: Secondary | ICD-10-CM

## 2010-11-17 DIAGNOSIS — D4620 Refractory anemia with excess of blasts, unspecified: Secondary | ICD-10-CM

## 2010-11-17 DIAGNOSIS — D631 Anemia in chronic kidney disease: Secondary | ICD-10-CM

## 2010-11-17 DIAGNOSIS — N189 Chronic kidney disease, unspecified: Secondary | ICD-10-CM

## 2010-11-17 LAB — CBC WITH DIFFERENTIAL/PLATELET
BASO%: 0.5 % (ref 0.0–2.0)
Basophils Absolute: 0 10*3/uL (ref 0.0–0.1)
EOS%: 4.1 % (ref 0.0–7.0)
Eosinophils Absolute: 0.3 10*3/uL (ref 0.0–0.5)
HGB: 9.4 g/dL — ABNORMAL LOW (ref 13.0–17.1)
LYMPH%: 27.8 % (ref 14.0–49.0)
MCH: 35.1 pg — ABNORMAL HIGH (ref 27.2–33.4)
MCHC: 34.1 g/dL (ref 32.0–36.0)
MCV: 103 fL — ABNORMAL HIGH (ref 79.3–98.0)
MONO#: 0.7 10*3/uL (ref 0.1–0.9)
RBC: 2.68 10*6/uL — ABNORMAL LOW (ref 4.20–5.82)
RDW: 19 % — ABNORMAL HIGH (ref 11.0–14.6)
WBC: 8.3 10*3/uL (ref 4.0–10.3)
lymph#: 2.3 10*3/uL (ref 0.9–3.3)
nRBC: 0 % (ref 0–0)

## 2010-11-25 NOTE — H&P (Signed)
[facility]  Patient:    Anthony Hutchinson, Anthony Hutchinson Visit Number: [id number] MRN: [id number]          Service Type: Attending:  Radene Knee., M.D. Dictated by:   Radene Knee., M.D. Adm. Date:  06/11/01                           History and Physical  CHIEF COMPLAINT:  This patient, age 77, is scheduled for transrectal needle biopsy to prostate, flexible cystoscopy at [facility] with a chief complaint of rising PSA of 7.9.  HISTORY OF PRESENT ILLNESS:  This 77 year old male has been followed in our office since 1994 when he was found to have a PSA of 4.3.  He had a benign biopsy carried out at that time.  He has been followed in our office, has had some prostatitis, but recently had slow rise of his PSA which failed to respond to antibiotics and most recent PSA was 7.18 March 2001, at which time he was advised to have a biopsy.  The patient reports that he gets up once or twice a night and voids every 2-3 hours.  He is passing no blood, gravel, or stone.  He has a fairly good flow most of the time.  The ultrasound in the office revealed no hydronephrosis, right or left, and _________ of residual urine 12 March 2001.  ALLERGIES:  PENICILLIN.  REGULAR MEDICATIONS:  Synthroid, Claritin, Saw Palmetto, quinine sulfate, multivitamins, Tylenol.  PAST MEDICAL/SURGICAL HISTORY: 1. He has had T&A. 2. Thyroid surgery. 3. Shoulder surgery. 4. In October 2001 he had an abdominal aortic aneurysm treated with a    percutaneous stent by Dr. Arbie Cookey and has done well.  FAMILY HISTORY:  Positive for heart disease and kidney stones, but no bleeders or prostate cancer.  His mother and father lived into their 3s and died of heart disease.  The patient is married, has two children.  SOCIAL HISTORY:  The patient smoked a pack for many years and did not use alcohol.  He is retired.  REVIEW OF SYSTEMS:  General health has been good.  Weight  stable.  HEENT unremarkable.  CARDIORESPIRATORY:  Denies any chest pain, heart attack, shortness of breath.  He has not coughed up any blood.  GASTROINTESTINAL:  He does have constipation and hemorrhoids.  Denies hepatitis and peptic ulcer disease.  BONES, JOINTS, MUSCLES:  He has arthritis and fungus of his nails. NEUROPSYCHIATRIC:  Unremarkable.  Denies fainting or falling out spells, stroke.  He had a little dizziness and numbness.  SKIN:  No unusual lesions. LYMPHATIC/HEMATOPOIETIC:  He has not had any history of anemia or enlarged nodes.  PHYSICAL EXAMINATION:  GENERAL:  Reveals a well-developed, well-nourished, 77 year old male.  VITAL SIGNS:  Blood pressure 160/80, pulse 72, respirations 16, temperature 96.8.  HEENT:  The ears and tympanic membranes are unremarkable.  Eyes react normally to light and accommodation.  Extraocular movements intact.  Pharynx benign. Teeth in poor condition.  NECK:  No enlargement of thyroid or nodes palpable.  CHEST:  Clear to percussion and auscultation.  HEART:  Normal sinus rhythm, no murmur.  ABDOMEN:  Free of masses or tenderness.  Liver, kidney, spleen, hernia are not detected.  He does have bilateral inguinal scars.  GENITALIA:  External genitalia and meatus are normal.  Penis is uncircumcised. Scrotum, anus, and perineum normal.  Testes are good size and symmetrical. The right epididymis is  thickened with a small spermatocele.  RECTAL:  The rectal tone is good.  The prostate is 25 g, symmetrical, firm. Seminal vesicles are not felt.  He does have some external hemorrhoids.  EXTREMITIES:  No edema.  Fairly good pulses distally.  NEUROLOGIC:  Grossly normal reflexes and sensation.  IMPRESSION: 1. Elevated PSA 7.9 with history of benign biopsy in 1994, at which time PSA    was 4.3. 2. He has benign prostatic hypertrophy and prostatitis. 3. Abdominal aortic aneurysm, post percutaneous stent, October 2001. 4. Hypertension. 5.  Hemorrhoids.  PLAN:  Will perform transurethral ultrasound-guided needle biopsy of the prostate at [facility] and flexible cystoscopy at that time as well. Dictated by:   Radene Knee., M.D. Attending:  Radene Knee., M.D. DD:  06/11/01 TD:  06/11/01 Job: [id number]

## 2010-11-25 NOTE — Op Note (Signed)
NAMEALISTAR, MCENERY                 ACCOUNT NO.:  [id number]   MEDICAL RECORD NO.:  [id number]          PATIENT TYPE:  AMB   LOCATION:  ENDO                         FACILITY:  [facility]   PHYSICIAN:  Georgiana Spinner, M.D.    DATE OF BIRTH:  [birth year]   DATE OF PROCEDURE:  07/25/2004  DATE OF DISCHARGE:                                 OPERATIVE REPORT   PROCEDURE:  Colonoscopy with biopsy and polypectomy.   INDICATIONS:  Colon polyp.   ANESTHESIA:  1.  Demerol 10 mg.  2.  Versed 1.5 mg.   DESCRIPTION OF PROCEDURE:  With patient mildly sedated in the left lateral  decubitus position, the Olympus videoscopic colonoscope was inserted into  the rectum and passed under direct vision to the cecum, identified by  ileocecal valve and appendiceal orifice.  In the cecum was a polyp which was  photographed as well and removed using snare cautery technique, setting of  20/20 blended current and suctioned into the endoscope for retrieval.  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  The  endoscope was straightened, withdrawn.  The patient's vital signs and pulse  oximeter remained stable.  The patient tolerated the procedure well without  apparent complications.   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   PLAN:  Await biopsy report.  The patient will call me for results and follow  up with me as an outpatient.      GMO/MEDQ  D:  07/25/2004  T:  07/25/2004  Job:  [id number]

## 2010-11-25 NOTE — Discharge Summary (Signed)
[place]  Patient:    Anthony Hutchinson, Anthony Hutchinson                        MRN: [id number] Adm. Date:  [id number] Disc. Date: 04/25/00 Attending:  Alyson Locket Dictator:   Areta Haber, P.A. CC:         Lilia Pro, M.D.   Discharge Summary  DATE OF BIRTH:  Mar 24, 2022  HISTORY OF PRESENT ILLNESS:  This is a 77 year old male referred by Lilia Pro, M.D., to Larina Earthly, M.D., for evaluation of abdominal aortic aneurysm.  The patient was initially seen in July of 2001 where he was evaluated for foot numbness with the possibility of a ruptured disk.  He was incidentally found to have 6.1 cm abdominal aortic aneurysm and this was confirmed by CT scan.  Arteriogram on February 20, 2000, additionally confirmed the finding and better delineated the anatomy for possible repair.  Larina Earthly, M.D., evaluated the patient and his studies and felt he was a candidate for ______ graft repair of the aneurysm.  He was admitted this hospitalization for the procedure.  PAST MEDICAL HISTORY:  1. Hypertension.  2. Hypothyroidism.  3. Arthritis. 4. Allergic rhinitis.  5. Cholecystitis per abdominal CT scan in August of 2001.  6. Gastroesophageal reflux symptoms.  7. Hemorrhoids.  PAST SURGICAL HISTORY:  1. Colonoscopy and polypectomy in 1998 by Sabino Gasser, M.D.  2. Rotator cuff surgery in 1997.  3. Herniorrhaphy in 1979.  4. Node dissection in 1999.  5. Thyroid surgery in 1984.  CURRENT MEDICATIONS: 1. Synthroid 0.125 mg q.d. 2. Enalapril 5 mg q.d. 3. Glucosamine sulfate 1000 mg q.d. 4. Aleve b.i.d. 5. Claritin p.r.n.  ALLERGIES:  No known drug allergies.  FAMILY HISTORY:  Please see the history and physical done on admission.  SOCIAL HISTORY:  Please see the history and physical done on admission.  REVIEW OF SYSTEMS:  Please see the history and physical done on admission.  PHYSICAL EXAMINATION:  Please see the history and physical done on  admission.  HOSPITAL COURSE:  The patient was admitted electively on April 23, 2000, and underwent the following procedure, ______ stent graft repair of abdominal aortic aneurysm.  The procedure was performed by Larina Earthly, M.D., and Quita Skye. Hart Rochester, M.D.  It was tolerated well and he was taken to the post anesthesia care unit in stable condition.  During the postoperative hospital course, the patient has done well.  He has remained hemodynamically intact.  His vascular status is stable with palpable dorsalis pedis pulses and ankle brachial indices greater than 1.0 bilaterally. His incisions are doing well.  He has been weaned from oxygen and maintains good saturations on room air.  He has tolerated a gradual decrease in activities.  His laboratory values are stable.  The postoperative hemoglobin and hematocrit are 9.4 and 27.2, respectively.  The creatinine is 1.3. Overall he is felt to be stable for tentative discharge in the morning of April 25, 2000, pending morning rounds reevaluation.  MEDICATIONS ON DISCHARGE:  As preoperatively.  For pain, he will receive a prescription for Tylox one to two every six hours as needed.  DISCHARGE INSTRUCTIONS:  The patient will receive written instructions in regard to medications, activity, diet, wound care, and follow-up.  FINAL DIAGNOSES: 1. Repair of abdominal aortic aneurysm by ______ stent grafting. 2. Hypertension. 3. Arthritis. 4. Allergic rhinitis. 5. Gastroesophageal reflux. 6. Cholelithiasis per CT scan. 7.  History of hemorrhoids. DD:  04/24/00 TD:  04/24/00 Job: [id number]

## 2010-12-08 ENCOUNTER — Other Ambulatory Visit: Payer: Self-pay | Admitting: Oncology

## 2010-12-08 ENCOUNTER — Encounter (HOSPITAL_BASED_OUTPATIENT_CLINIC_OR_DEPARTMENT_OTHER): Payer: Medicare Other | Admitting: Oncology

## 2010-12-08 DIAGNOSIS — N19 Unspecified kidney failure: Secondary | ICD-10-CM

## 2010-12-08 DIAGNOSIS — N189 Chronic kidney disease, unspecified: Secondary | ICD-10-CM

## 2010-12-08 DIAGNOSIS — D4620 Refractory anemia with excess of blasts, unspecified: Secondary | ICD-10-CM

## 2010-12-08 DIAGNOSIS — D649 Anemia, unspecified: Secondary | ICD-10-CM

## 2010-12-08 DIAGNOSIS — D631 Anemia in chronic kidney disease: Secondary | ICD-10-CM

## 2010-12-08 LAB — CBC & DIFF AND RETIC
Basophils Absolute: 0 10*3/uL (ref 0.0–0.1)
EOS%: 2.8 % (ref 0.0–7.0)
Eosinophils Absolute: 0.2 10*3/uL (ref 0.0–0.5)
HCT: 26.3 % — ABNORMAL LOW (ref 38.4–49.9)
HGB: 8.9 g/dL — ABNORMAL LOW (ref 13.0–17.1)
Immature Retic Fract: 8.5 % (ref 0.00–13.40)
LYMPH%: 24.2 % (ref 14.0–49.0)
MCH: 34.5 pg — ABNORMAL HIGH (ref 27.2–33.4)
MCHC: 33.8 g/dL (ref 32.0–36.0)
MCV: 101.9 fL — ABNORMAL HIGH (ref 79.3–98.0)
MONO%: 7.3 % (ref 0.0–14.0)
NEUT#: 5.6 10*3/uL (ref 1.5–6.5)
NEUT%: 65.5 % (ref 39.0–75.0)
RBC: 2.58 10*6/uL — ABNORMAL LOW (ref 4.20–5.82)
RDW: 19.1 % — ABNORMAL HIGH (ref 11.0–14.6)
Retic %: 0.49 % — ABNORMAL LOW (ref 0.50–1.60)
Retic Ct Abs: 12.64 10*3/uL — ABNORMAL LOW (ref 24.10–77.50)
WBC: 8.5 10*3/uL (ref 4.0–10.3)
lymph#: 2.1 10*3/uL (ref 0.9–3.3)
nRBC: 0 % (ref 0–0)

## 2010-12-08 LAB — FERRITIN: Ferritin: 741 ng/mL — ABNORMAL HIGH (ref 22–322)

## 2010-12-29 ENCOUNTER — Encounter (HOSPITAL_BASED_OUTPATIENT_CLINIC_OR_DEPARTMENT_OTHER): Payer: Medicare Other | Admitting: Oncology

## 2010-12-29 ENCOUNTER — Other Ambulatory Visit: Payer: Self-pay | Admitting: Oncology

## 2010-12-29 DIAGNOSIS — D4620 Refractory anemia with excess of blasts, unspecified: Secondary | ICD-10-CM

## 2010-12-29 DIAGNOSIS — D649 Anemia, unspecified: Secondary | ICD-10-CM

## 2010-12-29 DIAGNOSIS — N19 Unspecified kidney failure: Secondary | ICD-10-CM

## 2010-12-29 LAB — CBC WITH DIFFERENTIAL/PLATELET
BASO%: 0.5 % (ref 0.0–2.0)
EOS%: 3.5 % (ref 0.0–7.0)
HCT: 27.3 % — ABNORMAL LOW (ref 38.4–49.9)
LYMPH%: 26.4 % (ref 14.0–49.0)
MCH: 37 pg — ABNORMAL HIGH (ref 27.2–33.4)
MCHC: 33.7 g/dL (ref 32.0–36.0)
MCV: 109.7 fL — ABNORMAL HIGH (ref 79.3–98.0)
MONO#: 0.6 10*3/uL (ref 0.1–0.9)
MONO%: 8.6 % (ref 0.0–14.0)
NEUT%: 61 % (ref 39.0–75.0)
Platelets: 153 10*3/uL (ref 140–400)
RBC: 2.48 10*6/uL — ABNORMAL LOW (ref 4.20–5.82)
RDW: 21.2 % — ABNORMAL HIGH (ref 11.0–14.6)
WBC: 6.8 10*3/uL (ref 4.0–10.3)
lymph#: 1.8 10*3/uL (ref 0.9–3.3)

## 2011-01-19 ENCOUNTER — Encounter (HOSPITAL_BASED_OUTPATIENT_CLINIC_OR_DEPARTMENT_OTHER): Payer: Medicare Other | Admitting: Oncology

## 2011-01-19 ENCOUNTER — Other Ambulatory Visit: Payer: Self-pay | Admitting: Oncology

## 2011-01-19 DIAGNOSIS — D638 Anemia in other chronic diseases classified elsewhere: Secondary | ICD-10-CM

## 2011-01-19 DIAGNOSIS — D649 Anemia, unspecified: Secondary | ICD-10-CM

## 2011-01-19 DIAGNOSIS — D4620 Refractory anemia with excess of blasts, unspecified: Secondary | ICD-10-CM

## 2011-01-19 DIAGNOSIS — D631 Anemia in chronic kidney disease: Secondary | ICD-10-CM

## 2011-01-19 DIAGNOSIS — N189 Chronic kidney disease, unspecified: Secondary | ICD-10-CM

## 2011-01-19 DIAGNOSIS — N19 Unspecified kidney failure: Secondary | ICD-10-CM

## 2011-01-19 LAB — CBC & DIFF AND RETIC
BASO%: 0.4 % (ref 0.0–2.0)
EOS%: 4.1 % (ref 0.0–7.0)
HCT: 27.5 % — ABNORMAL LOW (ref 38.4–49.9)
Immature Retic Fract: 9.7 % (ref 0.00–13.40)
LYMPH%: 35.8 % (ref 14.0–49.0)
MCH: 35.5 pg — ABNORMAL HIGH (ref 27.2–33.4)
MCHC: 34.2 g/dL (ref 32.0–36.0)
MCV: 103.8 fL — ABNORMAL HIGH (ref 79.3–98.0)
MONO#: 0.6 10*3/uL (ref 0.1–0.9)
MONO%: 7.6 % (ref 0.0–14.0)
NEUT#: 4.1 10*3/uL (ref 1.5–6.5)
NEUT%: 52.1 % (ref 39.0–75.0)
RBC: 2.65 10*6/uL — ABNORMAL LOW (ref 4.20–5.82)
RDW: 19.2 % — ABNORMAL HIGH (ref 11.0–14.6)
Retic Ct Abs: 10.07 10*3/uL — ABNORMAL LOW (ref 24.10–77.50)
WBC: 7.9 10*3/uL (ref 4.0–10.3)
lymph#: 2.8 10*3/uL (ref 0.9–3.3)
nRBC: 0 % (ref 0–0)

## 2011-01-19 LAB — FERRITIN: Ferritin: 993 ng/mL — ABNORMAL HIGH (ref 22–322)

## 2011-02-09 ENCOUNTER — Other Ambulatory Visit: Payer: Self-pay | Admitting: Oncology

## 2011-02-09 ENCOUNTER — Encounter (HOSPITAL_BASED_OUTPATIENT_CLINIC_OR_DEPARTMENT_OTHER): Payer: Medicare Other | Admitting: Oncology

## 2011-02-09 DIAGNOSIS — D4620 Refractory anemia with excess of blasts, unspecified: Secondary | ICD-10-CM

## 2011-02-09 DIAGNOSIS — D649 Anemia, unspecified: Secondary | ICD-10-CM

## 2011-02-09 DIAGNOSIS — N19 Unspecified kidney failure: Secondary | ICD-10-CM

## 2011-02-09 LAB — CBC WITH DIFFERENTIAL/PLATELET
Basophils Absolute: 0 10*3/uL (ref 0.0–0.1)
Eosinophils Absolute: 0.3 10*3/uL (ref 0.0–0.5)
HGB: 9.5 g/dL — ABNORMAL LOW (ref 13.0–17.1)
NEUT#: 5.2 10*3/uL (ref 1.5–6.5)
RBC: 2.69 10*6/uL — ABNORMAL LOW (ref 4.20–5.82)
RDW: 19.6 % — ABNORMAL HIGH (ref 11.0–14.6)
WBC: 8.4 10*3/uL (ref 4.0–10.3)
lymph#: 2.1 10*3/uL (ref 0.9–3.3)
nRBC: 0 % (ref 0–0)

## 2011-03-02 ENCOUNTER — Encounter (HOSPITAL_BASED_OUTPATIENT_CLINIC_OR_DEPARTMENT_OTHER): Payer: Medicare Other | Admitting: Oncology

## 2011-03-02 ENCOUNTER — Other Ambulatory Visit: Payer: Self-pay | Admitting: Oncology

## 2011-03-02 DIAGNOSIS — N19 Unspecified kidney failure: Secondary | ICD-10-CM

## 2011-03-02 DIAGNOSIS — D631 Anemia in chronic kidney disease: Secondary | ICD-10-CM

## 2011-03-02 DIAGNOSIS — D4620 Refractory anemia with excess of blasts, unspecified: Secondary | ICD-10-CM

## 2011-03-02 DIAGNOSIS — D638 Anemia in other chronic diseases classified elsewhere: Secondary | ICD-10-CM

## 2011-03-02 DIAGNOSIS — N189 Chronic kidney disease, unspecified: Secondary | ICD-10-CM

## 2011-03-02 DIAGNOSIS — D649 Anemia, unspecified: Secondary | ICD-10-CM

## 2011-03-02 LAB — RETICULOCYTES (CHCC)
ABS Retic: 13.6 10*3/uL — ABNORMAL LOW (ref 19.0–186.0)
RBC.: 2.71 MIL/uL — ABNORMAL LOW (ref 4.22–5.81)

## 2011-03-02 LAB — CBC WITH DIFFERENTIAL/PLATELET
BASO%: 0.4 % (ref 0.0–2.0)
Eosinophils Absolute: 0.2 10*3/uL (ref 0.0–0.5)
HCT: 28 % — ABNORMAL LOW (ref 38.4–49.9)
LYMPH%: 24.6 % (ref 14.0–49.0)
MONO#: 0.5 10*3/uL (ref 0.1–0.9)
NEUT#: 4.6 10*3/uL (ref 1.5–6.5)
NEUT%: 64.9 % (ref 39.0–75.0)
Platelets: 166 10*3/uL (ref 140–400)
RBC: 2.58 10*6/uL — ABNORMAL LOW (ref 4.20–5.82)
WBC: 7.1 10*3/uL (ref 4.0–10.3)
lymph#: 1.7 10*3/uL (ref 0.9–3.3)

## 2011-03-02 LAB — FERRITIN: Ferritin: 864 ng/mL — ABNORMAL HIGH (ref 22–322)

## 2011-03-23 ENCOUNTER — Encounter (HOSPITAL_BASED_OUTPATIENT_CLINIC_OR_DEPARTMENT_OTHER): Payer: Medicare Other | Admitting: Oncology

## 2011-03-23 ENCOUNTER — Other Ambulatory Visit: Payer: Self-pay | Admitting: Oncology

## 2011-03-23 DIAGNOSIS — N19 Unspecified kidney failure: Secondary | ICD-10-CM

## 2011-03-23 DIAGNOSIS — N189 Chronic kidney disease, unspecified: Secondary | ICD-10-CM

## 2011-03-23 DIAGNOSIS — D649 Anemia, unspecified: Secondary | ICD-10-CM

## 2011-03-23 DIAGNOSIS — D631 Anemia in chronic kidney disease: Secondary | ICD-10-CM

## 2011-03-23 DIAGNOSIS — D4620 Refractory anemia with excess of blasts, unspecified: Secondary | ICD-10-CM

## 2011-03-23 LAB — CBC WITH DIFFERENTIAL/PLATELET
BASO%: 0.7 % (ref 0.0–2.0)
Basophils Absolute: 0.1 10*3/uL (ref 0.0–0.1)
HCT: 27.1 % — ABNORMAL LOW (ref 38.4–49.9)
HGB: 9.1 g/dL — ABNORMAL LOW (ref 13.0–17.1)
LYMPH%: 25.6 % (ref 14.0–49.0)
MCH: 36.5 pg — ABNORMAL HIGH (ref 27.2–33.4)
MCHC: 33.6 g/dL (ref 32.0–36.0)
MONO#: 0.6 10*3/uL (ref 0.1–0.9)
NEUT%: 61.6 % (ref 39.0–75.0)
Platelets: 168 10*3/uL (ref 140–400)
WBC: 7.5 10*3/uL (ref 4.0–10.3)
lymph#: 1.9 10*3/uL (ref 0.9–3.3)

## 2011-04-13 ENCOUNTER — Other Ambulatory Visit: Payer: Self-pay | Admitting: Oncology

## 2011-04-13 ENCOUNTER — Encounter (HOSPITAL_BASED_OUTPATIENT_CLINIC_OR_DEPARTMENT_OTHER): Payer: Medicare Other | Admitting: Oncology

## 2011-04-13 DIAGNOSIS — N19 Unspecified kidney failure: Secondary | ICD-10-CM

## 2011-04-13 DIAGNOSIS — D4620 Refractory anemia with excess of blasts, unspecified: Secondary | ICD-10-CM

## 2011-04-13 DIAGNOSIS — N189 Chronic kidney disease, unspecified: Secondary | ICD-10-CM

## 2011-04-13 DIAGNOSIS — D649 Anemia, unspecified: Secondary | ICD-10-CM

## 2011-04-13 DIAGNOSIS — D631 Anemia in chronic kidney disease: Secondary | ICD-10-CM

## 2011-04-13 LAB — CBC & DIFF AND RETIC
Basophils Absolute: 0 10*3/uL (ref 0.0–0.1)
EOS%: 4.7 % (ref 0.0–7.0)
Eosinophils Absolute: 0.3 10*3/uL (ref 0.0–0.5)
HGB: 8.6 g/dL — ABNORMAL LOW (ref 13.0–17.1)
LYMPH%: 26.1 % (ref 14.0–49.0)
MCH: 35.1 pg — ABNORMAL HIGH (ref 27.2–33.4)
MCV: 103.3 fL — ABNORMAL HIGH (ref 79.3–98.0)
MONO%: 9.1 % (ref 0.0–14.0)
NEUT#: 4.3 10*3/uL (ref 1.5–6.5)
NEUT%: 59.7 % (ref 39.0–75.0)
Platelets: 187 10*3/uL (ref 140–400)
RDW: 19.6 % — ABNORMAL HIGH (ref 11.0–14.6)
Retic %: 0.45 % — ABNORMAL LOW (ref 0.80–1.80)

## 2011-05-15 ENCOUNTER — Telehealth: Payer: Self-pay | Admitting: Oncology

## 2011-05-15 NOTE — Telephone Encounter (Signed)
Pt called and r/s missed appt on 10/25 to 12/13

## 2011-05-25 ENCOUNTER — Other Ambulatory Visit (HOSPITAL_BASED_OUTPATIENT_CLINIC_OR_DEPARTMENT_OTHER): Payer: Medicare Other

## 2011-05-25 ENCOUNTER — Other Ambulatory Visit: Payer: Self-pay | Admitting: Oncology

## 2011-05-25 ENCOUNTER — Ambulatory Visit (HOSPITAL_BASED_OUTPATIENT_CLINIC_OR_DEPARTMENT_OTHER): Payer: Medicare Other

## 2011-05-25 DIAGNOSIS — D631 Anemia in chronic kidney disease: Secondary | ICD-10-CM

## 2011-05-25 DIAGNOSIS — D649 Anemia, unspecified: Secondary | ICD-10-CM

## 2011-05-25 DIAGNOSIS — D638 Anemia in other chronic diseases classified elsewhere: Secondary | ICD-10-CM

## 2011-05-25 DIAGNOSIS — N189 Chronic kidney disease, unspecified: Secondary | ICD-10-CM

## 2011-05-25 DIAGNOSIS — D4620 Refractory anemia with excess of blasts, unspecified: Secondary | ICD-10-CM

## 2011-05-25 DIAGNOSIS — D630 Anemia in neoplastic disease: Secondary | ICD-10-CM

## 2011-05-25 LAB — CBC WITH DIFFERENTIAL/PLATELET
Basophils Absolute: 0 10*3/uL (ref 0.0–0.1)
Eosinophils Absolute: 0.3 10*3/uL (ref 0.0–0.5)
HCT: 27 % — ABNORMAL LOW (ref 38.4–49.9)
HGB: 9.1 g/dL — ABNORMAL LOW (ref 13.0–17.1)
LYMPH%: 22.8 % (ref 14.0–49.0)
MCV: 108.8 fL — ABNORMAL HIGH (ref 79.3–98.0)
MONO#: 0.6 10*3/uL (ref 0.1–0.9)
MONO%: 6.9 % (ref 0.0–14.0)
NEUT#: 5.5 10*3/uL (ref 1.5–6.5)
Platelets: 185 10*3/uL (ref 140–400)
RBC: 2.48 10*6/uL — ABNORMAL LOW (ref 4.20–5.82)
WBC: 8.2 10*3/uL (ref 4.0–10.3)

## 2011-05-25 MED ORDER — EPOETIN ALFA 20000 UNIT/ML IJ SOLN
40000.0000 [IU] | Freq: Once | INTRAMUSCULAR | Status: AC
  Administered 2011-05-25: 40000 [IU] via SUBCUTANEOUS
  Filled 2011-05-25: qty 2

## 2011-06-22 ENCOUNTER — Other Ambulatory Visit: Payer: Self-pay | Admitting: Oncology

## 2011-06-22 ENCOUNTER — Ambulatory Visit (HOSPITAL_BASED_OUTPATIENT_CLINIC_OR_DEPARTMENT_OTHER): Payer: Medicare Other | Admitting: Oncology

## 2011-06-22 ENCOUNTER — Telehealth: Payer: Self-pay | Admitting: *Deleted

## 2011-06-22 ENCOUNTER — Other Ambulatory Visit (HOSPITAL_BASED_OUTPATIENT_CLINIC_OR_DEPARTMENT_OTHER): Payer: Medicare Other

## 2011-06-22 VITALS — BP 132/57 | HR 93 | Temp 97.7°F | Ht 63.5 in | Wt 174.9 lb

## 2011-06-22 DIAGNOSIS — G609 Hereditary and idiopathic neuropathy, unspecified: Secondary | ICD-10-CM

## 2011-06-22 DIAGNOSIS — D4620 Refractory anemia with excess of blasts, unspecified: Secondary | ICD-10-CM

## 2011-06-22 DIAGNOSIS — N189 Chronic kidney disease, unspecified: Secondary | ICD-10-CM

## 2011-06-22 DIAGNOSIS — N19 Unspecified kidney failure: Secondary | ICD-10-CM

## 2011-06-22 DIAGNOSIS — D631 Anemia in chronic kidney disease: Secondary | ICD-10-CM

## 2011-06-22 DIAGNOSIS — D638 Anemia in other chronic diseases classified elsewhere: Secondary | ICD-10-CM

## 2011-06-22 DIAGNOSIS — D649 Anemia, unspecified: Secondary | ICD-10-CM

## 2011-06-22 LAB — CBC WITH DIFFERENTIAL/PLATELET
BASO%: 0.5 % (ref 0.0–2.0)
EOS%: 3.9 % (ref 0.0–7.0)
HCT: 25.5 % — ABNORMAL LOW (ref 38.4–49.9)
LYMPH%: 24.8 % (ref 14.0–49.0)
MCH: 35.2 pg — ABNORMAL HIGH (ref 27.2–33.4)
MCHC: 33.7 g/dL (ref 32.0–36.0)
MCV: 104.5 fL — ABNORMAL HIGH (ref 79.3–98.0)
MONO%: 6.9 % (ref 0.0–14.0)
NEUT%: 63.9 % (ref 39.0–75.0)
Platelets: 155 10*3/uL (ref 140–400)
RBC: 2.44 10*6/uL — ABNORMAL LOW (ref 4.20–5.82)
WBC: 8.7 10*3/uL (ref 4.0–10.3)
nRBC: 0 % (ref 0–0)

## 2011-06-22 MED ORDER — EPOETIN ALFA 20000 UNIT/ML IJ SOLN
40000.0000 [IU] | Freq: Once | INTRAMUSCULAR | Status: DC
  Filled 2011-06-22: qty 2

## 2011-06-22 NOTE — Telephone Encounter (Signed)
gave patient appointment for lab and injections for 07-2011 thru 06-2012 printed out and gave to the patient

## 2011-06-22 NOTE — Progress Notes (Signed)
ID: Otelia Sergeant   Interval History: The patient returns for followup of his refractory anemia with ringed sideroblasts. The interval history is generally stable. He continues to be the main caregiver for his wife, who he tells me has significant dementia. He drives, and "I'm not doing too bad for an 77 year old"  ROS:  He complains of cramps in his hands sometimes in the morning. He is brought this to his primary care physician's attention. He also has significant peripheral neuropathy involving his feet. He is very careful to look for his steps particularly when he is in an unfamiliar environment. Aside from this and he denies worsening dizziness, palpitations, chest pain or pressure, unusual fatigue, or other symptoms related to his anemia. Detailed review of systems was otherwise significant for easy bruising, doing pain as noted, mild urinary dribbling, and slight sinus issues.  Medications: I have reviewed the patient's current medications.  Current Outpatient Prescriptions  Medication Sig Dispense Refill  . levothyroxine (SYNTHROID, LEVOTHROID) 125 MCG tablet Take 125 mcg by mouth daily.        . Lisinopril (ZESTRIL PO) Take by mouth. Unknown dose       . loratadine (CLARITIN) 10 MG tablet Take 10 mg by mouth daily.        . Multiple Vitamin (MULTIVITAMIN) capsule Take 1 capsule by mouth daily.        . Tamsulosin HCl (FLOMAX) 0.4 MG CAPS Take 0.4 mg by mouth.           Objective:  Filed Vitals:   06/22/11 1049  BP: 132/57  Pulse: 93  Temp: 97.7 F (36.5 C)    Physical Exam:    Sclerae unicteric  Oropharynx clear  No peripheral adenopathy  Lungs clear -- no rales or rhonchi  Heart regular rate and rhythm  Abdomen benign  MSK no focal spinal tenderness, no peripheral edema  Neuro nonfocal    Lab Results:   CMP    Chemistry      Component Value Date/Time   NA 140 04/08/2009 0851   K 4.6 04/08/2009 0851   CL 107 04/08/2009 0851   CO2 24 04/08/2009 0851   BUN 27*  04/08/2009 0851   CREATININE 1.34 04/08/2009 0851      Component Value Date/Time   CALCIUM 9.4 04/08/2009 0851   ALKPHOS 53 04/08/2009 0851   AST 16 04/08/2009 0851   ALT 17 04/08/2009 0851   BILITOT 0.8 04/08/2009 0851       CBC Lab Results  Component Value Date   WBC 8.7 06/22/2011   HGB 8.6* 06/22/2011   HCT 25.5* 06/22/2011   MCV 104.5* 06/22/2011   PLT 155 06/22/2011   NEUTROABS 5.6 06/22/2011    Studies/Results:  No results found.  Assessment: 77 year old [place] man with a history of refractory anemia with ringed sideroblasts initially diagnosed June of 2006, also with anemia of renal failure, very stable on chronic EPO supplementation.   Plan: We are going to continue his erythropoietin every 3 weeks, on Thursdays, with every 3 week CBCs. I'm making no changes in the dose. I wonder if he would benefit from some iron supplementation with regards to his cramps. We discussed this and he will consider it. Otherwise she will return to see me in one year. He knows to call for any problems that may develop before the next visit  Janazia Schreier C 06/22/2011

## 2011-06-22 NOTE — Progress Notes (Signed)
Addended by: Darnelle Catalan, GUS C on: 06/22/2011 12:55 PM   Modules accepted: Orders, SmartSet

## 2011-07-13 ENCOUNTER — Other Ambulatory Visit (HOSPITAL_BASED_OUTPATIENT_CLINIC_OR_DEPARTMENT_OTHER): Payer: Medicare Other | Admitting: Lab

## 2011-07-13 ENCOUNTER — Ambulatory Visit (HOSPITAL_BASED_OUTPATIENT_CLINIC_OR_DEPARTMENT_OTHER): Payer: Medicare Other

## 2011-07-13 DIAGNOSIS — D4620 Refractory anemia with excess of blasts, unspecified: Secondary | ICD-10-CM

## 2011-07-13 DIAGNOSIS — N189 Chronic kidney disease, unspecified: Secondary | ICD-10-CM

## 2011-07-13 DIAGNOSIS — D631 Anemia in chronic kidney disease: Secondary | ICD-10-CM

## 2011-07-13 DIAGNOSIS — N039 Chronic nephritic syndrome with unspecified morphologic changes: Secondary | ICD-10-CM

## 2011-07-13 DIAGNOSIS — D638 Anemia in other chronic diseases classified elsewhere: Secondary | ICD-10-CM

## 2011-07-13 DIAGNOSIS — D649 Anemia, unspecified: Secondary | ICD-10-CM

## 2011-07-13 LAB — CBC WITH DIFFERENTIAL/PLATELET
BASO%: 0.2 % (ref 0.0–2.0)
EOS%: 4.4 % (ref 0.0–7.0)
HCT: 28.4 % — ABNORMAL LOW (ref 38.4–49.9)
MCH: 37.6 pg — ABNORMAL HIGH (ref 27.2–33.4)
MCHC: 34.3 g/dL (ref 32.0–36.0)
MONO#: 0.6 10*3/uL (ref 0.1–0.9)
RBC: 2.6 10*6/uL — ABNORMAL LOW (ref 4.20–5.82)
RDW: 22 % — ABNORMAL HIGH (ref 11.0–14.6)
WBC: 7.8 10*3/uL (ref 4.0–10.3)
lymph#: 1.9 10*3/uL (ref 0.9–3.3)

## 2011-07-13 MED ORDER — EPOETIN ALFA 20000 UNIT/ML IJ SOLN
40000.0000 [IU] | Freq: Once | INTRAMUSCULAR | Status: AC
  Administered 2011-07-13: 40000 [IU] via SUBCUTANEOUS
  Filled 2011-07-13: qty 2

## 2011-07-20 ENCOUNTER — Other Ambulatory Visit: Payer: Medicare Other | Admitting: Lab

## 2011-07-20 ENCOUNTER — Ambulatory Visit: Payer: Medicare Other

## 2011-08-03 ENCOUNTER — Ambulatory Visit (HOSPITAL_BASED_OUTPATIENT_CLINIC_OR_DEPARTMENT_OTHER): Payer: Medicare Other

## 2011-08-03 ENCOUNTER — Other Ambulatory Visit: Payer: Medicare Other | Admitting: Lab

## 2011-08-03 DIAGNOSIS — D638 Anemia in other chronic diseases classified elsewhere: Secondary | ICD-10-CM

## 2011-08-03 DIAGNOSIS — D631 Anemia in chronic kidney disease: Secondary | ICD-10-CM

## 2011-08-03 DIAGNOSIS — N189 Chronic kidney disease, unspecified: Secondary | ICD-10-CM

## 2011-08-03 LAB — CBC WITH DIFFERENTIAL/PLATELET
BASO%: 0.9 % (ref 0.0–2.0)
Basophils Absolute: 0.1 10*3/uL (ref 0.0–0.1)
Eosinophils Absolute: 0.4 10*3/uL (ref 0.0–0.5)
HCT: 28.2 % — ABNORMAL LOW (ref 38.4–49.9)
HGB: 9.4 g/dL — ABNORMAL LOW (ref 13.0–17.1)
LYMPH%: 25.9 % (ref 14.0–49.0)
MCHC: 33.3 g/dL (ref 32.0–36.0)
MONO#: 0.7 10*3/uL (ref 0.1–0.9)
NEUT#: 4.8 10*3/uL (ref 1.5–6.5)
NEUT%: 59.4 % (ref 39.0–75.0)
Platelets: 178 10*3/uL (ref 140–400)
WBC: 8 10*3/uL (ref 4.0–10.3)
lymph#: 2.1 10*3/uL (ref 0.9–3.3)

## 2011-08-03 MED ORDER — EPOETIN ALFA 40000 UNIT/ML IJ SOLN
40000.0000 [IU] | Freq: Once | INTRAMUSCULAR | Status: AC
  Administered 2011-08-03: 40000 [IU] via SUBCUTANEOUS
  Filled 2011-08-03: qty 1

## 2011-08-24 ENCOUNTER — Other Ambulatory Visit: Payer: Medicare Other | Admitting: Lab

## 2011-08-24 ENCOUNTER — Ambulatory Visit (HOSPITAL_BASED_OUTPATIENT_CLINIC_OR_DEPARTMENT_OTHER): Payer: Medicare Other

## 2011-08-24 VITALS — BP 164/69 | HR 60 | Temp 97.5°F

## 2011-08-24 DIAGNOSIS — D631 Anemia in chronic kidney disease: Secondary | ICD-10-CM

## 2011-08-24 DIAGNOSIS — N039 Chronic nephritic syndrome with unspecified morphologic changes: Secondary | ICD-10-CM

## 2011-08-24 DIAGNOSIS — N189 Chronic kidney disease, unspecified: Secondary | ICD-10-CM

## 2011-08-24 DIAGNOSIS — D638 Anemia in other chronic diseases classified elsewhere: Secondary | ICD-10-CM

## 2011-08-24 LAB — CBC WITH DIFFERENTIAL/PLATELET
Basophils Absolute: 0 10*3/uL (ref 0.0–0.1)
Eosinophils Absolute: 0.4 10*3/uL (ref 0.0–0.5)
HGB: 9.6 g/dL — ABNORMAL LOW (ref 13.0–17.1)
LYMPH%: 26.2 % (ref 14.0–49.0)
MCV: 104.9 fL — ABNORMAL HIGH (ref 79.3–98.0)
MONO#: 0.6 10*3/uL (ref 0.1–0.9)
MONO%: 7.9 % (ref 0.0–14.0)
NEUT#: 4.6 10*3/uL (ref 1.5–6.5)
Platelets: 201 10*3/uL (ref 140–400)
RBC: 2.67 10*6/uL — ABNORMAL LOW (ref 4.20–5.82)
WBC: 7.6 10*3/uL (ref 4.0–10.3)
nRBC: 0 % (ref 0–0)

## 2011-08-24 MED ORDER — EPOETIN ALFA 20000 UNIT/ML IJ SOLN
40000.0000 [IU] | Freq: Once | INTRAMUSCULAR | Status: AC
  Administered 2011-08-24: 40000 [IU] via SUBCUTANEOUS

## 2011-09-14 ENCOUNTER — Ambulatory Visit (HOSPITAL_BASED_OUTPATIENT_CLINIC_OR_DEPARTMENT_OTHER): Payer: Medicare Other

## 2011-09-14 ENCOUNTER — Other Ambulatory Visit (HOSPITAL_BASED_OUTPATIENT_CLINIC_OR_DEPARTMENT_OTHER): Payer: Medicare Other | Admitting: Lab

## 2011-09-14 VITALS — BP 147/62 | HR 66 | Temp 97.8°F

## 2011-09-14 DIAGNOSIS — N189 Chronic kidney disease, unspecified: Secondary | ICD-10-CM

## 2011-09-14 DIAGNOSIS — N039 Chronic nephritic syndrome with unspecified morphologic changes: Secondary | ICD-10-CM

## 2011-09-14 DIAGNOSIS — D638 Anemia in other chronic diseases classified elsewhere: Secondary | ICD-10-CM

## 2011-09-14 LAB — CBC WITH DIFFERENTIAL/PLATELET
Basophils Absolute: 0 10*3/uL (ref 0.0–0.1)
HCT: 29.3 % — ABNORMAL LOW (ref 38.4–49.9)
HGB: 9.9 g/dL — ABNORMAL LOW (ref 13.0–17.1)
LYMPH%: 29.3 % (ref 14.0–49.0)
MCH: 37 pg — ABNORMAL HIGH (ref 27.2–33.4)
MONO#: 0.7 10*3/uL (ref 0.1–0.9)
NEUT%: 57.2 % (ref 39.0–75.0)
Platelets: 194 10*3/uL (ref 140–400)
WBC: 8.5 10*3/uL (ref 4.0–10.3)
lymph#: 2.5 10*3/uL (ref 0.9–3.3)

## 2011-09-14 MED ORDER — EPOETIN ALFA 40000 UNIT/ML IJ SOLN
40000.0000 [IU] | Freq: Once | INTRAMUSCULAR | Status: AC
  Administered 2011-09-14: 40000 [IU] via SUBCUTANEOUS
  Filled 2011-09-14: qty 1

## 2011-10-05 ENCOUNTER — Other Ambulatory Visit (HOSPITAL_BASED_OUTPATIENT_CLINIC_OR_DEPARTMENT_OTHER): Payer: Medicare Other | Admitting: Lab

## 2011-10-05 ENCOUNTER — Ambulatory Visit (HOSPITAL_BASED_OUTPATIENT_CLINIC_OR_DEPARTMENT_OTHER): Payer: Medicare Other

## 2011-10-05 VITALS — BP 118/50 | HR 58 | Temp 97.0°F

## 2011-10-05 DIAGNOSIS — D631 Anemia in chronic kidney disease: Secondary | ICD-10-CM

## 2011-10-05 DIAGNOSIS — N039 Chronic nephritic syndrome with unspecified morphologic changes: Secondary | ICD-10-CM

## 2011-10-05 DIAGNOSIS — N189 Chronic kidney disease, unspecified: Secondary | ICD-10-CM

## 2011-10-05 DIAGNOSIS — D638 Anemia in other chronic diseases classified elsewhere: Secondary | ICD-10-CM

## 2011-10-05 LAB — CBC WITH DIFFERENTIAL/PLATELET
Basophils Absolute: 0.1 10*3/uL (ref 0.0–0.1)
EOS%: 3.7 % (ref 0.0–7.0)
HGB: 9.5 g/dL — ABNORMAL LOW (ref 13.0–17.1)
MCH: 36.7 pg — ABNORMAL HIGH (ref 27.2–33.4)
MCV: 109.4 fL — ABNORMAL HIGH (ref 79.3–98.0)
MONO%: 7.6 % (ref 0.0–14.0)
NEUT#: 5 10*3/uL (ref 1.5–6.5)
RBC: 2.58 10*6/uL — ABNORMAL LOW (ref 4.20–5.82)
RDW: 21.4 % — ABNORMAL HIGH (ref 11.0–14.6)
lymph#: 2 10*3/uL (ref 0.9–3.3)

## 2011-10-05 MED ORDER — EPOETIN ALFA 20000 UNIT/ML IJ SOLN
40000.0000 [IU] | Freq: Once | INTRAMUSCULAR | Status: AC
  Administered 2011-10-05: 40000 [IU] via SUBCUTANEOUS

## 2011-10-23 ENCOUNTER — Ambulatory Visit: Payer: Medicare Other | Attending: Internal Medicine | Admitting: Physical Therapy

## 2011-10-23 DIAGNOSIS — IMO0001 Reserved for inherently not codable concepts without codable children: Secondary | ICD-10-CM | POA: Insufficient documentation

## 2011-10-23 DIAGNOSIS — R5381 Other malaise: Secondary | ICD-10-CM | POA: Insufficient documentation

## 2011-10-23 DIAGNOSIS — R293 Abnormal posture: Secondary | ICD-10-CM | POA: Insufficient documentation

## 2011-10-23 DIAGNOSIS — R269 Unspecified abnormalities of gait and mobility: Secondary | ICD-10-CM | POA: Insufficient documentation

## 2011-10-26 ENCOUNTER — Other Ambulatory Visit (HOSPITAL_BASED_OUTPATIENT_CLINIC_OR_DEPARTMENT_OTHER): Payer: Medicare Other | Admitting: Lab

## 2011-10-26 ENCOUNTER — Ambulatory Visit (HOSPITAL_BASED_OUTPATIENT_CLINIC_OR_DEPARTMENT_OTHER): Payer: Medicare Other

## 2011-10-26 VITALS — BP 128/63 | HR 63 | Temp 96.8°F

## 2011-10-26 DIAGNOSIS — D631 Anemia in chronic kidney disease: Secondary | ICD-10-CM

## 2011-10-26 DIAGNOSIS — D638 Anemia in other chronic diseases classified elsewhere: Secondary | ICD-10-CM

## 2011-10-26 DIAGNOSIS — N189 Chronic kidney disease, unspecified: Secondary | ICD-10-CM

## 2011-10-26 DIAGNOSIS — D649 Anemia, unspecified: Secondary | ICD-10-CM

## 2011-10-26 LAB — CBC WITH DIFFERENTIAL/PLATELET
Basophils Absolute: 0.1 10*3/uL (ref 0.0–0.1)
Eosinophils Absolute: 0.4 10*3/uL (ref 0.0–0.5)
HGB: 9.3 g/dL — ABNORMAL LOW (ref 13.0–17.1)
MONO#: 0.7 10*3/uL (ref 0.1–0.9)
NEUT#: 4.3 10*3/uL (ref 1.5–6.5)
RBC: 2.52 10*6/uL — ABNORMAL LOW (ref 4.20–5.82)
RDW: 20.8 % — ABNORMAL HIGH (ref 11.0–14.6)
WBC: 7.4 10*3/uL (ref 4.0–10.3)
lymph#: 2 10*3/uL (ref 0.9–3.3)
nRBC: 0 % (ref 0–0)

## 2011-10-26 MED ORDER — EPOETIN ALFA 40000 UNIT/ML IJ SOLN
40000.0000 [IU] | Freq: Once | INTRAMUSCULAR | Status: AC
  Administered 2011-10-26: 40000 [IU] via SUBCUTANEOUS
  Filled 2011-10-26: qty 1

## 2011-10-30 ENCOUNTER — Ambulatory Visit: Payer: Medicare Other | Admitting: Physical Therapy

## 2011-10-31 ENCOUNTER — Other Ambulatory Visit: Payer: Self-pay | Admitting: Oncology

## 2011-11-01 ENCOUNTER — Ambulatory Visit: Payer: Medicare Other | Admitting: Physical Therapy

## 2011-11-06 ENCOUNTER — Ambulatory Visit: Payer: Medicare Other | Admitting: Physical Therapy

## 2011-11-08 ENCOUNTER — Ambulatory Visit: Payer: Medicare Other | Attending: Internal Medicine | Admitting: Physical Therapy

## 2011-11-08 DIAGNOSIS — R293 Abnormal posture: Secondary | ICD-10-CM | POA: Insufficient documentation

## 2011-11-08 DIAGNOSIS — IMO0001 Reserved for inherently not codable concepts without codable children: Secondary | ICD-10-CM | POA: Insufficient documentation

## 2011-11-08 DIAGNOSIS — R269 Unspecified abnormalities of gait and mobility: Secondary | ICD-10-CM | POA: Insufficient documentation

## 2011-11-08 DIAGNOSIS — R5381 Other malaise: Secondary | ICD-10-CM | POA: Insufficient documentation

## 2011-11-13 ENCOUNTER — Ambulatory Visit: Payer: Medicare Other | Admitting: Physical Therapy

## 2011-11-15 ENCOUNTER — Encounter: Payer: Medicare Other | Admitting: Physical Therapy

## 2011-11-16 ENCOUNTER — Other Ambulatory Visit (HOSPITAL_BASED_OUTPATIENT_CLINIC_OR_DEPARTMENT_OTHER): Payer: Medicare Other | Admitting: Lab

## 2011-11-16 ENCOUNTER — Ambulatory Visit (HOSPITAL_BASED_OUTPATIENT_CLINIC_OR_DEPARTMENT_OTHER): Payer: Medicare Other

## 2011-11-16 VITALS — BP 120/62 | HR 65 | Temp 97.5°F

## 2011-11-16 DIAGNOSIS — N189 Chronic kidney disease, unspecified: Secondary | ICD-10-CM

## 2011-11-16 DIAGNOSIS — D638 Anemia in other chronic diseases classified elsewhere: Secondary | ICD-10-CM

## 2011-11-16 DIAGNOSIS — D631 Anemia in chronic kidney disease: Secondary | ICD-10-CM

## 2011-11-16 DIAGNOSIS — N039 Chronic nephritic syndrome with unspecified morphologic changes: Secondary | ICD-10-CM

## 2011-11-16 LAB — CBC WITH DIFFERENTIAL/PLATELET
BASO%: 1.1 % (ref 0.0–2.0)
Basophils Absolute: 0.1 10*3/uL (ref 0.0–0.1)
Eosinophils Absolute: 0.2 10*3/uL (ref 0.0–0.5)
HCT: 27.6 % — ABNORMAL LOW (ref 38.4–49.9)
HGB: 9.3 g/dL — ABNORMAL LOW (ref 13.0–17.1)
MCHC: 33.6 g/dL (ref 32.0–36.0)
MONO#: 0.6 10*3/uL (ref 0.1–0.9)
NEUT#: 3.9 10*3/uL (ref 1.5–6.5)
NEUT%: 56.7 % (ref 39.0–75.0)
Platelets: 164 10*3/uL (ref 140–400)
WBC: 6.9 10*3/uL (ref 4.0–10.3)
lymph#: 2.1 10*3/uL (ref 0.9–3.3)

## 2011-11-16 MED ORDER — EPOETIN ALFA 40000 UNIT/ML IJ SOLN
40000.0000 [IU] | Freq: Once | INTRAMUSCULAR | Status: AC
  Administered 2011-11-16: 40000 [IU] via SUBCUTANEOUS
  Filled 2011-11-16: qty 1

## 2011-11-20 ENCOUNTER — Encounter: Payer: Medicare Other | Admitting: Physical Therapy

## 2011-11-22 ENCOUNTER — Encounter: Payer: Medicare Other | Admitting: Physical Therapy

## 2011-11-27 ENCOUNTER — Encounter: Payer: Medicare Other | Admitting: Physical Therapy

## 2011-11-29 ENCOUNTER — Encounter: Payer: Medicare Other | Admitting: Physical Therapy

## 2011-12-07 ENCOUNTER — Ambulatory Visit (HOSPITAL_BASED_OUTPATIENT_CLINIC_OR_DEPARTMENT_OTHER): Payer: Medicare Other

## 2011-12-07 ENCOUNTER — Other Ambulatory Visit (HOSPITAL_BASED_OUTPATIENT_CLINIC_OR_DEPARTMENT_OTHER): Payer: Medicare Other | Admitting: Lab

## 2011-12-07 ENCOUNTER — Telehealth: Payer: Self-pay | Admitting: *Deleted

## 2011-12-07 VITALS — BP 124/60 | HR 70 | Temp 97.6°F

## 2011-12-07 DIAGNOSIS — D631 Anemia in chronic kidney disease: Secondary | ICD-10-CM

## 2011-12-07 DIAGNOSIS — N189 Chronic kidney disease, unspecified: Secondary | ICD-10-CM

## 2011-12-07 DIAGNOSIS — D649 Anemia, unspecified: Secondary | ICD-10-CM

## 2011-12-07 DIAGNOSIS — D638 Anemia in other chronic diseases classified elsewhere: Secondary | ICD-10-CM

## 2011-12-07 LAB — CBC WITH DIFFERENTIAL/PLATELET
BASO%: 0.5 % (ref 0.0–2.0)
HGB: 8.7 g/dL — ABNORMAL LOW (ref 13.0–17.1)
LYMPH%: 32.1 % (ref 14.0–49.0)
MCH: 35.4 pg — ABNORMAL HIGH (ref 27.2–33.4)
MONO#: 0.6 10*3/uL (ref 0.1–0.9)
NEUT#: 4.1 10*3/uL (ref 1.5–6.5)
NEUT%: 55.5 % (ref 39.0–75.0)
RBC: 2.46 10*6/uL — ABNORMAL LOW (ref 4.20–5.82)

## 2011-12-07 MED ORDER — EPOETIN ALFA 20000 UNIT/ML IJ SOLN
40000.0000 [IU] | Freq: Once | INTRAMUSCULAR | Status: AC
  Administered 2011-12-07: 40000 [IU] via SUBCUTANEOUS
  Filled 2011-12-07: qty 2

## 2011-12-07 NOTE — Telephone Encounter (Signed)
Called patient about appointment for this AM.  He thought that his appointment was next week.  Have reschedule appointment for later today.

## 2011-12-12 ENCOUNTER — Other Ambulatory Visit: Payer: Self-pay | Admitting: Oncology

## 2011-12-13 ENCOUNTER — Encounter: Payer: Self-pay | Admitting: Oncology

## 2011-12-13 NOTE — Progress Notes (Signed)
Patient stop by my office this morning he had some questions about his bill,so I call the billing department and spoke with solomon,and so I let the patient talk with him about his bill.

## 2011-12-28 ENCOUNTER — Ambulatory Visit (HOSPITAL_BASED_OUTPATIENT_CLINIC_OR_DEPARTMENT_OTHER): Payer: Medicare Other

## 2011-12-28 ENCOUNTER — Other Ambulatory Visit (HOSPITAL_BASED_OUTPATIENT_CLINIC_OR_DEPARTMENT_OTHER): Payer: Medicare Other | Admitting: Lab

## 2011-12-28 VITALS — BP 143/59 | HR 57 | Temp 97.0°F

## 2011-12-28 DIAGNOSIS — N189 Chronic kidney disease, unspecified: Secondary | ICD-10-CM

## 2011-12-28 DIAGNOSIS — N039 Chronic nephritic syndrome with unspecified morphologic changes: Secondary | ICD-10-CM

## 2011-12-28 DIAGNOSIS — D638 Anemia in other chronic diseases classified elsewhere: Secondary | ICD-10-CM

## 2011-12-28 DIAGNOSIS — D649 Anemia, unspecified: Secondary | ICD-10-CM

## 2011-12-28 LAB — CBC WITH DIFFERENTIAL/PLATELET
Basophils Absolute: 0.1 10*3/uL (ref 0.0–0.1)
EOS%: 4.5 % (ref 0.0–7.0)
HCT: 29 % — ABNORMAL LOW (ref 38.4–49.9)
HGB: 9.6 g/dL — ABNORMAL LOW (ref 13.0–17.1)
MCH: 36.7 pg — ABNORMAL HIGH (ref 27.2–33.4)
MCV: 110.7 fL — ABNORMAL HIGH (ref 79.3–98.0)
MONO%: 9.1 % (ref 0.0–14.0)
NEUT%: 59.3 % (ref 39.0–75.0)
RDW: 21.9 % — ABNORMAL HIGH (ref 11.0–14.6)

## 2011-12-28 MED ORDER — EPOETIN ALFA 40000 UNIT/ML IJ SOLN
40000.0000 [IU] | Freq: Once | INTRAMUSCULAR | Status: AC
  Administered 2011-12-28: 40000 [IU] via SUBCUTANEOUS
  Filled 2011-12-28: qty 1

## 2012-01-18 ENCOUNTER — Other Ambulatory Visit (HOSPITAL_BASED_OUTPATIENT_CLINIC_OR_DEPARTMENT_OTHER): Payer: Medicare Other | Admitting: Lab

## 2012-01-18 ENCOUNTER — Ambulatory Visit (HOSPITAL_BASED_OUTPATIENT_CLINIC_OR_DEPARTMENT_OTHER): Payer: Medicare Other

## 2012-01-18 VITALS — BP 128/59 | HR 66 | Temp 97.2°F

## 2012-01-18 DIAGNOSIS — N189 Chronic kidney disease, unspecified: Secondary | ICD-10-CM

## 2012-01-18 DIAGNOSIS — D638 Anemia in other chronic diseases classified elsewhere: Secondary | ICD-10-CM

## 2012-01-18 DIAGNOSIS — D631 Anemia in chronic kidney disease: Secondary | ICD-10-CM

## 2012-01-18 DIAGNOSIS — D649 Anemia, unspecified: Secondary | ICD-10-CM

## 2012-01-18 LAB — CBC WITH DIFFERENTIAL/PLATELET
EOS%: 5.5 % (ref 0.0–7.0)
MCH: 35.3 pg — ABNORMAL HIGH (ref 27.2–33.4)
MCV: 101.5 fL — ABNORMAL HIGH (ref 79.3–98.0)
MONO%: 10.4 % (ref 0.0–14.0)
RBC: 2.69 10*6/uL — ABNORMAL LOW (ref 4.20–5.82)
RDW: 19.9 % — ABNORMAL HIGH (ref 11.0–14.6)
nRBC: 0 % (ref 0–0)

## 2012-01-18 MED ORDER — EPOETIN ALFA 40000 UNIT/ML IJ SOLN
40000.0000 [IU] | Freq: Once | INTRAMUSCULAR | Status: AC
  Administered 2012-01-18: 40000 [IU] via SUBCUTANEOUS
  Filled 2012-01-18: qty 1

## 2012-02-08 ENCOUNTER — Ambulatory Visit (HOSPITAL_BASED_OUTPATIENT_CLINIC_OR_DEPARTMENT_OTHER): Payer: Medicare Other

## 2012-02-08 ENCOUNTER — Other Ambulatory Visit (HOSPITAL_BASED_OUTPATIENT_CLINIC_OR_DEPARTMENT_OTHER): Payer: Medicare Other | Admitting: Lab

## 2012-02-08 VITALS — BP 123/58 | HR 68 | Temp 97.4°F

## 2012-02-08 DIAGNOSIS — D631 Anemia in chronic kidney disease: Secondary | ICD-10-CM

## 2012-02-08 DIAGNOSIS — D638 Anemia in other chronic diseases classified elsewhere: Secondary | ICD-10-CM

## 2012-02-08 DIAGNOSIS — D649 Anemia, unspecified: Secondary | ICD-10-CM

## 2012-02-08 DIAGNOSIS — N189 Chronic kidney disease, unspecified: Secondary | ICD-10-CM

## 2012-02-08 LAB — CBC WITH DIFFERENTIAL/PLATELET
BASO%: 1 % (ref 0.0–2.0)
LYMPH%: 20.7 % (ref 14.0–49.0)
MCHC: 33.2 g/dL (ref 32.0–36.0)
MONO#: 0.6 10*3/uL (ref 0.1–0.9)
Platelets: 164 10*3/uL (ref 140–400)
RBC: 2.54 10*6/uL — ABNORMAL LOW (ref 4.20–5.82)
WBC: 7.6 10*3/uL (ref 4.0–10.3)
lymph#: 1.6 10*3/uL (ref 0.9–3.3)

## 2012-02-08 MED ORDER — EPOETIN ALFA 40000 UNIT/ML IJ SOLN
40000.0000 [IU] | Freq: Once | INTRAMUSCULAR | Status: AC
  Administered 2012-02-08: 40000 [IU] via SUBCUTANEOUS
  Filled 2012-02-08: qty 1

## 2012-02-29 ENCOUNTER — Ambulatory Visit (HOSPITAL_BASED_OUTPATIENT_CLINIC_OR_DEPARTMENT_OTHER): Payer: Medicare Other

## 2012-02-29 ENCOUNTER — Other Ambulatory Visit (HOSPITAL_BASED_OUTPATIENT_CLINIC_OR_DEPARTMENT_OTHER): Payer: Medicare Other | Admitting: Lab

## 2012-02-29 VITALS — BP 141/79 | HR 63 | Temp 97.0°F

## 2012-02-29 DIAGNOSIS — N189 Chronic kidney disease, unspecified: Secondary | ICD-10-CM

## 2012-02-29 DIAGNOSIS — N039 Chronic nephritic syndrome with unspecified morphologic changes: Secondary | ICD-10-CM

## 2012-02-29 DIAGNOSIS — D631 Anemia in chronic kidney disease: Secondary | ICD-10-CM

## 2012-02-29 DIAGNOSIS — D638 Anemia in other chronic diseases classified elsewhere: Secondary | ICD-10-CM

## 2012-02-29 LAB — CBC WITH DIFFERENTIAL/PLATELET
Basophils Absolute: 0 10*3/uL (ref 0.0–0.1)
EOS%: 7.8 % — ABNORMAL HIGH (ref 0.0–7.0)
HCT: 27.5 % — ABNORMAL LOW (ref 38.4–49.9)
HGB: 9.4 g/dL — ABNORMAL LOW (ref 13.0–17.1)
MCH: 34.8 pg — ABNORMAL HIGH (ref 27.2–33.4)
MCV: 101.9 fL — ABNORMAL HIGH (ref 79.3–98.0)
NEUT%: 53.1 % (ref 39.0–75.0)
lymph#: 1.9 10*3/uL (ref 0.9–3.3)

## 2012-02-29 MED ORDER — EPOETIN ALFA 20000 UNIT/ML IJ SOLN
40000.0000 [IU] | Freq: Once | INTRAMUSCULAR | Status: AC
  Administered 2012-02-29: 40000 [IU] via SUBCUTANEOUS
  Filled 2012-02-29: qty 2

## 2012-03-18 ENCOUNTER — Encounter: Payer: Self-pay | Admitting: Oncology

## 2012-03-18 NOTE — Progress Notes (Signed)
Spoke w/ pt's daughter Liborio Nixon) she stated pt is getting bills and denial letters for an inj that pt has been receiving for some time now.  Denial states that it's being denied due to incorrect coding.  I have referred her to Liane Comber to correct the problem and resubmit the bills.

## 2012-03-20 ENCOUNTER — Other Ambulatory Visit: Payer: Self-pay | Admitting: Internal Medicine

## 2012-03-20 DIAGNOSIS — R1011 Right upper quadrant pain: Secondary | ICD-10-CM

## 2012-03-21 ENCOUNTER — Other Ambulatory Visit (HOSPITAL_BASED_OUTPATIENT_CLINIC_OR_DEPARTMENT_OTHER): Payer: Medicare Other | Admitting: Lab

## 2012-03-21 ENCOUNTER — Ambulatory Visit (HOSPITAL_BASED_OUTPATIENT_CLINIC_OR_DEPARTMENT_OTHER): Payer: Medicare Other

## 2012-03-21 VITALS — BP 148/75 | HR 66 | Temp 97.3°F

## 2012-03-21 DIAGNOSIS — N189 Chronic kidney disease, unspecified: Secondary | ICD-10-CM

## 2012-03-21 DIAGNOSIS — D631 Anemia in chronic kidney disease: Secondary | ICD-10-CM

## 2012-03-21 DIAGNOSIS — N039 Chronic nephritic syndrome with unspecified morphologic changes: Secondary | ICD-10-CM

## 2012-03-21 DIAGNOSIS — D638 Anemia in other chronic diseases classified elsewhere: Secondary | ICD-10-CM

## 2012-03-21 LAB — CBC WITH DIFFERENTIAL/PLATELET
Basophils Absolute: 0.1 10*3/uL (ref 0.0–0.1)
EOS%: 5.1 % (ref 0.0–7.0)
HGB: 9.6 g/dL — ABNORMAL LOW (ref 13.0–17.1)
LYMPH%: 29.4 % (ref 14.0–49.0)
MCH: 37.2 pg — ABNORMAL HIGH (ref 27.2–33.4)
MCV: 109.5 fL — ABNORMAL HIGH (ref 79.3–98.0)
MONO%: 7.9 % (ref 0.0–14.0)
NEUT%: 56.4 % (ref 39.0–75.0)
RDW: 22 % — ABNORMAL HIGH (ref 11.0–14.6)

## 2012-03-21 MED ORDER — EPOETIN ALFA 20000 UNIT/ML IJ SOLN
40000.0000 [IU] | Freq: Once | INTRAMUSCULAR | Status: AC
  Administered 2012-03-21: 40000 [IU] via SUBCUTANEOUS
  Filled 2012-03-21: qty 2

## 2012-03-25 ENCOUNTER — Other Ambulatory Visit: Payer: Self-pay | Admitting: Internal Medicine

## 2012-03-25 ENCOUNTER — Ambulatory Visit
Admission: RE | Admit: 2012-03-25 | Discharge: 2012-03-25 | Disposition: A | Discharge status: Home / Self Care | Payer: Medicare Other | Source: Ambulatory Visit | Attending: Internal Medicine | Admitting: Internal Medicine

## 2012-03-25 DIAGNOSIS — R1011 Right upper quadrant pain: Secondary | ICD-10-CM

## 2012-03-27 ENCOUNTER — Other Ambulatory Visit: Payer: Self-pay | Admitting: Oncology

## 2012-03-29 ENCOUNTER — Other Ambulatory Visit (HOSPITAL_COMMUNITY): Payer: Self-pay | Admitting: Internal Medicine

## 2012-03-29 ENCOUNTER — Other Ambulatory Visit: Payer: Self-pay | Admitting: Internal Medicine

## 2012-03-29 DIAGNOSIS — R748 Abnormal levels of other serum enzymes: Secondary | ICD-10-CM

## 2012-04-04 ENCOUNTER — Encounter (HOSPITAL_COMMUNITY)
Admission: RE | Admit: 2012-04-04 | Discharge: 2012-04-04 | Disposition: A | Discharge status: Home / Self Care | Payer: Medicare Other | Source: Ambulatory Visit | Attending: Internal Medicine | Admitting: Internal Medicine

## 2012-04-04 ENCOUNTER — Ambulatory Visit (HOSPITAL_COMMUNITY)
Admission: RE | Admit: 2012-04-04 | Discharge: 2012-04-04 | Disposition: A | Discharge status: Home / Self Care | Payer: Medicare Other | Source: Ambulatory Visit | Attending: Internal Medicine | Admitting: Internal Medicine

## 2012-04-04 DIAGNOSIS — Z8546 Personal history of malignant neoplasm of prostate: Secondary | ICD-10-CM | POA: Insufficient documentation

## 2012-04-04 DIAGNOSIS — N189 Chronic kidney disease, unspecified: Secondary | ICD-10-CM | POA: Insufficient documentation

## 2012-04-04 DIAGNOSIS — D638 Anemia in other chronic diseases classified elsewhere: Secondary | ICD-10-CM | POA: Insufficient documentation

## 2012-04-04 DIAGNOSIS — R748 Abnormal levels of other serum enzymes: Secondary | ICD-10-CM | POA: Insufficient documentation

## 2012-04-04 DIAGNOSIS — Z880 Allergy status to penicillin: Secondary | ICD-10-CM | POA: Insufficient documentation

## 2012-04-04 DIAGNOSIS — D631 Anemia in chronic kidney disease: Secondary | ICD-10-CM | POA: Insufficient documentation

## 2012-04-04 MED ORDER — TECHNETIUM TC 99M MEDRONATE IV KIT
25.0000 | PACK | Freq: Once | INTRAVENOUS | Status: AC | PRN
  Administered 2012-04-04: 25 via INTRAVENOUS

## 2012-04-11 ENCOUNTER — Other Ambulatory Visit (HOSPITAL_BASED_OUTPATIENT_CLINIC_OR_DEPARTMENT_OTHER): Payer: Medicare Other | Admitting: Lab

## 2012-04-11 ENCOUNTER — Ambulatory Visit (HOSPITAL_BASED_OUTPATIENT_CLINIC_OR_DEPARTMENT_OTHER): Payer: Medicare Other

## 2012-04-11 VITALS — BP 125/65 | HR 60 | Temp 97.1°F

## 2012-04-11 DIAGNOSIS — N189 Chronic kidney disease, unspecified: Secondary | ICD-10-CM

## 2012-04-11 DIAGNOSIS — D638 Anemia in other chronic diseases classified elsewhere: Secondary | ICD-10-CM

## 2012-04-11 DIAGNOSIS — D631 Anemia in chronic kidney disease: Secondary | ICD-10-CM

## 2012-04-11 DIAGNOSIS — N039 Chronic nephritic syndrome with unspecified morphologic changes: Secondary | ICD-10-CM

## 2012-04-11 LAB — CBC WITH DIFFERENTIAL/PLATELET
BASO%: 1.1 % (ref 0.0–2.0)
HCT: 28.8 % — ABNORMAL LOW (ref 38.4–49.9)
LYMPH%: 27.5 % (ref 14.0–49.0)
MCHC: 33.4 g/dL (ref 32.0–36.0)
MONO#: 0.6 10*3/uL (ref 0.1–0.9)
NEUT%: 56.9 % (ref 39.0–75.0)
Platelets: 169 10*3/uL (ref 140–400)
WBC: 6.5 10*3/uL (ref 4.0–10.3)

## 2012-04-11 MED ORDER — EPOETIN ALFA 40000 UNIT/ML IJ SOLN
40000.0000 [IU] | Freq: Once | INTRAMUSCULAR | Status: AC
  Administered 2012-04-11: 40000 [IU] via SUBCUTANEOUS
  Filled 2012-04-11: qty 1

## 2012-05-01 ENCOUNTER — Encounter (INDEPENDENT_AMBULATORY_CARE_PROVIDER_SITE_OTHER): Payer: Self-pay | Admitting: Surgery

## 2012-05-02 ENCOUNTER — Ambulatory Visit (HOSPITAL_BASED_OUTPATIENT_CLINIC_OR_DEPARTMENT_OTHER): Payer: Medicare Other

## 2012-05-02 ENCOUNTER — Encounter (INDEPENDENT_AMBULATORY_CARE_PROVIDER_SITE_OTHER): Payer: Self-pay | Admitting: Surgery

## 2012-05-02 ENCOUNTER — Other Ambulatory Visit (HOSPITAL_BASED_OUTPATIENT_CLINIC_OR_DEPARTMENT_OTHER): Payer: Medicare Other | Admitting: Lab

## 2012-05-02 VITALS — BP 147/65 | HR 57 | Temp 97.0°F

## 2012-05-02 DIAGNOSIS — D631 Anemia in chronic kidney disease: Secondary | ICD-10-CM

## 2012-05-02 DIAGNOSIS — N189 Chronic kidney disease, unspecified: Secondary | ICD-10-CM

## 2012-05-02 DIAGNOSIS — D649 Anemia, unspecified: Secondary | ICD-10-CM

## 2012-05-02 LAB — CBC WITH DIFFERENTIAL/PLATELET
BASO%: 1 % (ref 0.0–2.0)
EOS%: 4.3 % (ref 0.0–7.0)
HCT: 29.1 % — ABNORMAL LOW (ref 38.4–49.9)
LYMPH%: 28.6 % (ref 14.0–49.0)
MCH: 37.6 pg — ABNORMAL HIGH (ref 27.2–33.4)
MCHC: 33.8 g/dL (ref 32.0–36.0)
MCV: 111.4 fL — ABNORMAL HIGH (ref 79.3–98.0)
MONO%: 9.1 % (ref 0.0–14.0)
NEUT%: 57 % (ref 39.0–75.0)
Platelets: 141 10*3/uL (ref 140–400)
lymph#: 1.8 10*3/uL (ref 0.9–3.3)

## 2012-05-02 MED ORDER — EPOETIN ALFA 40000 UNIT/ML IJ SOLN
40000.0000 [IU] | Freq: Once | INTRAMUSCULAR | Status: AC
  Administered 2012-05-02: 40000 [IU] via SUBCUTANEOUS
  Filled 2012-05-02: qty 1

## 2012-05-14 ENCOUNTER — Encounter (INDEPENDENT_AMBULATORY_CARE_PROVIDER_SITE_OTHER): Payer: Self-pay | Admitting: Surgery

## 2012-05-14 ENCOUNTER — Ambulatory Visit (INDEPENDENT_AMBULATORY_CARE_PROVIDER_SITE_OTHER): Payer: Medicare Other | Admitting: Surgery

## 2012-05-14 VITALS — BP 148/82 | HR 74 | Temp 97.8°F | Ht 69.0 in | Wt 168.2 lb

## 2012-05-14 DIAGNOSIS — K8020 Calculus of gallbladder without cholecystitis without obstruction: Secondary | ICD-10-CM

## 2012-05-14 NOTE — Progress Notes (Signed)
Subjective:     Patient ID: Anthony Hutchinson, male   DOB: Dec 05, 1921, 78 y.o.   MRN: [id number]  HPI  He is referred to me by Dr. Nicholos Johns for evaluation of gallstones and elevated liver function tests. The patient denies any abdominal pain whatsoever. He eats what he wants to and has no right upper quadrant abdominal discomfort, nausea, or vomiting. He has multiple chronic abdominal complaints. Laboratory data shows mild elevation of his transaminases and alkaline phosphatase Review of Systems     Objective:   Physical Exam On exam, his abdomen is soft and nontender with no guarding. His ultrasound shows gallstones but no gallbladder wall thickening and the bile duct is normal    Assessment:     cholelithiasis    Plan:     I do not suspect gallstones are the cause of his liver function tests as he is asymptomatic. He is not interested in having cholecystectomy at this point. I believe this is very reasonable. Should he start having any symptoms, he will call me back and we will readdress the situation.

## 2012-05-22 ENCOUNTER — Other Ambulatory Visit: Payer: Self-pay | Admitting: Emergency Medicine

## 2012-05-22 DIAGNOSIS — N189 Chronic kidney disease, unspecified: Secondary | ICD-10-CM

## 2012-05-23 ENCOUNTER — Ambulatory Visit (HOSPITAL_BASED_OUTPATIENT_CLINIC_OR_DEPARTMENT_OTHER): Payer: Medicare Other

## 2012-05-23 ENCOUNTER — Other Ambulatory Visit (HOSPITAL_BASED_OUTPATIENT_CLINIC_OR_DEPARTMENT_OTHER): Payer: Medicare Other | Admitting: Lab

## 2012-05-23 ENCOUNTER — Other Ambulatory Visit: Payer: Self-pay | Admitting: Oncology

## 2012-05-23 VITALS — BP 132/61 | HR 70 | Temp 97.1°F

## 2012-05-23 DIAGNOSIS — N189 Chronic kidney disease, unspecified: Secondary | ICD-10-CM

## 2012-05-23 DIAGNOSIS — D631 Anemia in chronic kidney disease: Secondary | ICD-10-CM

## 2012-05-23 DIAGNOSIS — D638 Anemia in other chronic diseases classified elsewhere: Secondary | ICD-10-CM

## 2012-05-23 LAB — CBC WITH DIFFERENTIAL/PLATELET
BASO%: 0.7 % (ref 0.0–2.0)
EOS%: 2.7 % (ref 0.0–7.0)
MCH: 36 pg — ABNORMAL HIGH (ref 27.2–33.4)
MCHC: 33.8 g/dL (ref 32.0–36.0)
MONO%: 9.6 % (ref 0.0–14.0)
RBC: 2.61 10*6/uL — ABNORMAL LOW (ref 4.20–5.82)
RDW: 21 % — ABNORMAL HIGH (ref 11.0–14.6)
lymph#: 1.8 10*3/uL (ref 0.9–3.3)

## 2012-05-23 MED ORDER — EPOETIN ALFA 40000 UNIT/ML IJ SOLN
40000.0000 [IU] | Freq: Once | INTRAMUSCULAR | Status: AC
  Administered 2012-05-23: 40000 [IU] via SUBCUTANEOUS
  Filled 2012-05-23: qty 1

## 2012-06-13 ENCOUNTER — Ambulatory Visit (HOSPITAL_BASED_OUTPATIENT_CLINIC_OR_DEPARTMENT_OTHER): Payer: Medicare Other

## 2012-06-13 ENCOUNTER — Other Ambulatory Visit: Payer: Self-pay | Admitting: Emergency Medicine

## 2012-06-13 ENCOUNTER — Other Ambulatory Visit (HOSPITAL_BASED_OUTPATIENT_CLINIC_OR_DEPARTMENT_OTHER): Payer: Medicare Other | Admitting: Lab

## 2012-06-13 VITALS — BP 132/57 | HR 62 | Temp 98.5°F

## 2012-06-13 DIAGNOSIS — D638 Anemia in other chronic diseases classified elsewhere: Secondary | ICD-10-CM

## 2012-06-13 DIAGNOSIS — N039 Chronic nephritic syndrome with unspecified morphologic changes: Secondary | ICD-10-CM

## 2012-06-13 DIAGNOSIS — D631 Anemia in chronic kidney disease: Secondary | ICD-10-CM

## 2012-06-13 DIAGNOSIS — N189 Chronic kidney disease, unspecified: Secondary | ICD-10-CM

## 2012-06-13 LAB — CBC WITH DIFFERENTIAL/PLATELET
Basophils Absolute: 0.1 10*3/uL (ref 0.0–0.1)
EOS%: 2.9 % (ref 0.0–7.0)
Eosinophils Absolute: 0.2 10*3/uL (ref 0.0–0.5)
HGB: 9.6 g/dL — ABNORMAL LOW (ref 13.0–17.1)
MCH: 37.8 pg — ABNORMAL HIGH (ref 27.2–33.4)
NEUT#: 3.4 10*3/uL (ref 1.5–6.5)
RBC: 2.54 10*6/uL — ABNORMAL LOW (ref 4.20–5.82)
RDW: 22.1 % — ABNORMAL HIGH (ref 11.0–14.6)
lymph#: 1.6 10*3/uL (ref 0.9–3.3)

## 2012-06-13 MED ORDER — EPOETIN ALFA 40000 UNIT/ML IJ SOLN
40000.0000 [IU] | Freq: Once | INTRAMUSCULAR | Status: AC
  Administered 2012-06-13: 40000 [IU] via SUBCUTANEOUS
  Filled 2012-06-13: qty 1

## 2012-07-02 ENCOUNTER — Other Ambulatory Visit: Payer: Self-pay | Admitting: *Deleted

## 2012-07-02 DIAGNOSIS — D631 Anemia in chronic kidney disease: Secondary | ICD-10-CM

## 2012-07-02 DIAGNOSIS — N189 Chronic kidney disease, unspecified: Secondary | ICD-10-CM

## 2012-07-04 ENCOUNTER — Ambulatory Visit (HOSPITAL_BASED_OUTPATIENT_CLINIC_OR_DEPARTMENT_OTHER): Payer: Medicare Other

## 2012-07-04 ENCOUNTER — Other Ambulatory Visit (HOSPITAL_BASED_OUTPATIENT_CLINIC_OR_DEPARTMENT_OTHER): Payer: Medicare Other | Admitting: Lab

## 2012-07-04 VITALS — BP 132/60 | HR 57 | Temp 96.6°F

## 2012-07-04 DIAGNOSIS — N039 Chronic nephritic syndrome with unspecified morphologic changes: Secondary | ICD-10-CM

## 2012-07-04 DIAGNOSIS — D631 Anemia in chronic kidney disease: Secondary | ICD-10-CM

## 2012-07-04 DIAGNOSIS — N189 Chronic kidney disease, unspecified: Secondary | ICD-10-CM

## 2012-07-04 DIAGNOSIS — D4620 Refractory anemia with excess of blasts, unspecified: Secondary | ICD-10-CM

## 2012-07-04 DIAGNOSIS — D638 Anemia in other chronic diseases classified elsewhere: Secondary | ICD-10-CM

## 2012-07-04 LAB — CBC WITH DIFFERENTIAL/PLATELET
Basophils Absolute: 0.1 10*3/uL (ref 0.0–0.1)
Eosinophils Absolute: 0.2 10*3/uL (ref 0.0–0.5)
HGB: 9.5 g/dL — ABNORMAL LOW (ref 13.0–17.1)
MCV: 112.5 fL — ABNORMAL HIGH (ref 79.3–98.0)
MONO#: 0.4 10*3/uL (ref 0.1–0.9)
MONO%: 8.6 % (ref 0.0–14.0)
NEUT#: 3.1 10*3/uL (ref 1.5–6.5)
RBC: 2.48 10*6/uL — ABNORMAL LOW (ref 4.20–5.82)
RDW: 21.9 % — ABNORMAL HIGH (ref 11.0–14.6)
WBC: 5.2 10*3/uL (ref 4.0–10.3)

## 2012-07-04 MED ORDER — EPOETIN ALFA 40000 UNIT/ML IJ SOLN
40000.0000 [IU] | Freq: Once | INTRAMUSCULAR | Status: AC
  Administered 2012-07-04: 40000 [IU] via SUBCUTANEOUS
  Filled 2012-07-04: qty 1

## 2012-07-25 ENCOUNTER — Other Ambulatory Visit (HOSPITAL_BASED_OUTPATIENT_CLINIC_OR_DEPARTMENT_OTHER): Payer: Medicare Other | Admitting: Lab

## 2012-07-25 ENCOUNTER — Ambulatory Visit (HOSPITAL_BASED_OUTPATIENT_CLINIC_OR_DEPARTMENT_OTHER): Payer: Medicare Other

## 2012-07-25 VITALS — BP 143/61 | HR 58 | Temp 96.9°F

## 2012-07-25 DIAGNOSIS — D638 Anemia in other chronic diseases classified elsewhere: Secondary | ICD-10-CM

## 2012-07-25 DIAGNOSIS — N189 Chronic kidney disease, unspecified: Secondary | ICD-10-CM

## 2012-07-25 DIAGNOSIS — N039 Chronic nephritic syndrome with unspecified morphologic changes: Secondary | ICD-10-CM

## 2012-07-25 DIAGNOSIS — D4620 Refractory anemia with excess of blasts, unspecified: Secondary | ICD-10-CM

## 2012-07-25 DIAGNOSIS — D631 Anemia in chronic kidney disease: Secondary | ICD-10-CM

## 2012-07-25 LAB — CBC WITH DIFFERENTIAL/PLATELET
Basophils Absolute: 0 10*3/uL (ref 0.0–0.1)
Eosinophils Absolute: 0.2 10*3/uL (ref 0.0–0.5)
HCT: 26.3 % — ABNORMAL LOW (ref 38.4–49.9)
HGB: 8.8 g/dL — ABNORMAL LOW (ref 13.0–17.1)
LYMPH%: 29.1 % (ref 14.0–49.0)
MCV: 104.4 fL — ABNORMAL HIGH (ref 79.3–98.0)
MONO#: 0.5 10*3/uL (ref 0.1–0.9)
MONO%: 11.3 % (ref 0.0–14.0)
NEUT#: 2.6 10*3/uL (ref 1.5–6.5)
NEUT%: 54.4 % (ref 39.0–75.0)
Platelets: 130 10*3/uL — ABNORMAL LOW (ref 140–400)
RBC: 2.52 10*6/uL — ABNORMAL LOW (ref 4.20–5.82)
WBC: 4.8 10*3/uL (ref 4.0–10.3)
nRBC: 0 % (ref 0–0)

## 2012-07-25 MED ORDER — EPOETIN ALFA 40000 UNIT/ML IJ SOLN
40000.0000 [IU] | Freq: Once | INTRAMUSCULAR | Status: AC
  Administered 2012-07-25: 40000 [IU] via SUBCUTANEOUS
  Filled 2012-07-25: qty 1

## 2012-08-15 ENCOUNTER — Other Ambulatory Visit: Payer: Self-pay | Admitting: *Deleted

## 2012-08-15 ENCOUNTER — Other Ambulatory Visit (HOSPITAL_BASED_OUTPATIENT_CLINIC_OR_DEPARTMENT_OTHER): Payer: Medicare Other | Admitting: Lab

## 2012-08-15 ENCOUNTER — Ambulatory Visit (HOSPITAL_BASED_OUTPATIENT_CLINIC_OR_DEPARTMENT_OTHER): Payer: Medicare Other

## 2012-08-15 VITALS — BP 142/72 | HR 64 | Temp 97.0°F

## 2012-08-15 DIAGNOSIS — N039 Chronic nephritic syndrome with unspecified morphologic changes: Secondary | ICD-10-CM

## 2012-08-15 DIAGNOSIS — D4620 Refractory anemia with excess of blasts, unspecified: Secondary | ICD-10-CM

## 2012-08-15 DIAGNOSIS — D638 Anemia in other chronic diseases classified elsewhere: Secondary | ICD-10-CM

## 2012-08-15 DIAGNOSIS — D631 Anemia in chronic kidney disease: Secondary | ICD-10-CM

## 2012-08-15 DIAGNOSIS — N189 Chronic kidney disease, unspecified: Secondary | ICD-10-CM

## 2012-08-15 LAB — CBC WITH DIFFERENTIAL/PLATELET
BASO%: 0.8 % (ref 0.0–2.0)
Basophils Absolute: 0 10*3/uL (ref 0.0–0.1)
EOS%: 3.9 % (ref 0.0–7.0)
HCT: 27.8 % — ABNORMAL LOW (ref 38.4–49.9)
HGB: 9.4 g/dL — ABNORMAL LOW (ref 13.0–17.1)
LYMPH%: 29.9 % (ref 14.0–49.0)
MCH: 35.6 pg — ABNORMAL HIGH (ref 27.2–33.4)
MCHC: 33.8 g/dL (ref 32.0–36.0)
MCV: 105.3 fL — ABNORMAL HIGH (ref 79.3–98.0)
NEUT%: 55.7 % (ref 39.0–75.0)
Platelets: 125 10*3/uL — ABNORMAL LOW (ref 140–400)
lymph#: 1.5 10*3/uL (ref 0.9–3.3)

## 2012-08-15 MED ORDER — EPOETIN ALFA 40000 UNIT/ML IJ SOLN
40000.0000 [IU] | Freq: Once | INTRAMUSCULAR | Status: AC
  Administered 2012-08-15: 40000 [IU] via SUBCUTANEOUS
  Filled 2012-08-15: qty 1

## 2012-09-04 ENCOUNTER — Other Ambulatory Visit: Payer: Self-pay | Admitting: Physician Assistant

## 2012-09-04 DIAGNOSIS — D638 Anemia in other chronic diseases classified elsewhere: Secondary | ICD-10-CM

## 2012-09-05 ENCOUNTER — Other Ambulatory Visit (HOSPITAL_BASED_OUTPATIENT_CLINIC_OR_DEPARTMENT_OTHER): Payer: Medicare Other | Admitting: Lab

## 2012-09-05 ENCOUNTER — Telehealth: Payer: Self-pay | Admitting: Oncology

## 2012-09-05 ENCOUNTER — Ambulatory Visit (HOSPITAL_BASED_OUTPATIENT_CLINIC_OR_DEPARTMENT_OTHER): Payer: Medicare Other

## 2012-09-05 ENCOUNTER — Other Ambulatory Visit: Payer: Self-pay | Admitting: *Deleted

## 2012-09-05 VITALS — BP 125/67 | HR 62 | Temp 98.4°F

## 2012-09-05 DIAGNOSIS — N189 Chronic kidney disease, unspecified: Secondary | ICD-10-CM

## 2012-09-05 LAB — CBC WITH DIFFERENTIAL/PLATELET
Eosinophils Absolute: 0.1 10*3/uL (ref 0.0–0.5)
HCT: 26 % — ABNORMAL LOW (ref 38.4–49.9)
LYMPH%: 30.6 % (ref 14.0–49.0)
MCHC: 34.3 g/dL (ref 32.0–36.0)
MCV: 111 fL — ABNORMAL HIGH (ref 79.3–98.0)
MONO#: 0.5 10*3/uL (ref 0.1–0.9)
MONO%: 10.8 % (ref 0.0–14.0)
NEUT%: 55 % (ref 39.0–75.0)
Platelets: 102 10*3/uL — ABNORMAL LOW (ref 140–400)
RBC: 2.34 10*6/uL — ABNORMAL LOW (ref 4.20–5.82)
WBC: 4.4 10*3/uL (ref 4.0–10.3)

## 2012-09-05 MED ORDER — EPOETIN ALFA 40000 UNIT/ML IJ SOLN
40000.0000 [IU] | Freq: Once | INTRAMUSCULAR | Status: AC
  Administered 2012-09-05: 40000 [IU] via SUBCUTANEOUS
  Filled 2012-09-05: qty 1

## 2012-09-05 NOTE — Telephone Encounter (Signed)
gave pt appt for lab, ML and injection on 3/21, per Val, RN , nurse of Dr. Darnelle Catalan

## 2012-09-11 ENCOUNTER — Ambulatory Visit: Payer: Medicare Other | Admitting: Physician Assistant

## 2012-09-27 ENCOUNTER — Encounter: Payer: Self-pay | Admitting: Physician Assistant

## 2012-09-27 ENCOUNTER — Telehealth: Payer: Self-pay | Admitting: *Deleted

## 2012-09-27 ENCOUNTER — Other Ambulatory Visit (HOSPITAL_BASED_OUTPATIENT_CLINIC_OR_DEPARTMENT_OTHER): Payer: Medicare Other | Admitting: Lab

## 2012-09-27 ENCOUNTER — Ambulatory Visit (HOSPITAL_BASED_OUTPATIENT_CLINIC_OR_DEPARTMENT_OTHER): Payer: Medicare Other | Admitting: Physician Assistant

## 2012-09-27 ENCOUNTER — Ambulatory Visit: Payer: Medicare Other

## 2012-09-27 VITALS — BP 149/61 | HR 56 | Temp 97.4°F | Resp 18 | Ht 69.0 in | Wt 165.9 lb

## 2012-09-27 DIAGNOSIS — D631 Anemia in chronic kidney disease: Secondary | ICD-10-CM

## 2012-09-27 DIAGNOSIS — N189 Chronic kidney disease, unspecified: Secondary | ICD-10-CM

## 2012-09-27 DIAGNOSIS — D4620 Refractory anemia with excess of blasts, unspecified: Secondary | ICD-10-CM

## 2012-09-27 DIAGNOSIS — D638 Anemia in other chronic diseases classified elsewhere: Secondary | ICD-10-CM

## 2012-09-27 DIAGNOSIS — N039 Chronic nephritic syndrome with unspecified morphologic changes: Secondary | ICD-10-CM

## 2012-09-27 LAB — CBC WITH DIFFERENTIAL/PLATELET
BASO%: 1.2 % (ref 0.0–2.0)
EOS%: 2 % (ref 0.0–7.0)
HCT: 26.7 % — ABNORMAL LOW (ref 38.4–49.9)
LYMPH%: 28.2 % (ref 14.0–49.0)
MCH: 38.1 pg — ABNORMAL HIGH (ref 27.2–33.4)
MCHC: 34.1 g/dL (ref 32.0–36.0)
MONO%: 10.1 % (ref 0.0–14.0)
NEUT%: 58.5 % (ref 39.0–75.0)
Platelets: 101 10*3/uL — ABNORMAL LOW (ref 140–400)
RBC: 2.39 10*6/uL — ABNORMAL LOW (ref 4.20–5.82)

## 2012-09-27 MED ORDER — EPOETIN ALFA 40000 UNIT/ML IJ SOLN
40000.0000 [IU] | Freq: Once | INTRAMUSCULAR | Status: AC
  Administered 2012-09-27: 40000 [IU] via SUBCUTANEOUS
  Filled 2012-09-27: qty 1

## 2012-09-27 NOTE — Telephone Encounter (Signed)
appts made and printed 

## 2012-09-27 NOTE — Progress Notes (Signed)
ID: Anthony Hutchinson   DOB: 17-Nov-1921  MR#: [id number]  NWG#:[id number]  PCP: Steva Ready, MD GYN:  SU:  OTHER MD:   HISTORY OF PRESENT ILLNESS: We have lab work dating back to 10-03-02 and at that time, Anthony Hutchinson hemoglobin was 10.7 with an MCV of 107.1.  The platelet count was 213 and the white cell count was 8.7.  Creatinine was 1.5 with a BUN of 20.  In March 2005, the patient had three Hemoccults  that were negative.  In April 2005 his white cell count was normal again, his platelet count was normal again, and his hemoglobin was stable at 10.7 with an MCV of 106.6.  The creatinine was 1.2.  The next set of labs that I have is from 10-27-04.  This time the white cell count was elevated at 12.6.  The hemoglobin was again 10.7.  The MCV was 107 and the platelet count was normal at 265.  The creatinine was 1.4.  Liver function tests were normal.    In addition to these tests, Dr. Nicholos Johns completed an anemia work up with a folate which was normal at 20.5 and a vitamin B12 which was normal at 446.  He also referred the patient to Dr. Sabino Gasser and, according to Anthony Hutchinson, Dr. Virginia Rochester performed colonoscopy and EGD (I do not have those reports), which were reportedly negative for a bleeding source.  A bone marrow biopsy performed 12/09/04 did not show any evidence of leukemia or prostate cancer.  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  He was determined to have a mixed anemia, partly due to the myelodysplasia, partly due to renal failure since he has a very low creatinine clearance.  He began receiving EPO in late June 2006. Additional treatment plan as detailed below.    INTERVAL HISTORY: Anthony Hutchinson returns today for routine followup of refractory anemia. He tells me he is "feeling good for his age". He continues to be followed regularly by Dr. Nicholos Johns. His biggest complaint today is peripheral neuropathy in the feet which is  stable.  Interval history is remarkable for the fact that he and his wife just celebrated her 70th wedding anniversary. He continues to be her primary caregiver secondary to significant dementia.   REVIEW OF SYSTEMS: Anthony Hutchinson has had no recent illnesses and denies any fevers, chills, or night sweats. He's eating and drinking well denies any problems with nausea. He has some mild constipation for which she is been increasing the fiber in his diet. He does in fact finds helpful. He denies any signs of abnormal bruising or bleeding whatsoever. He's had no abdominal pain. He has mild urinary dribbling, but no urinary incontinence, dysuria, or hematuria. He has some shortness of breath with exertion, but no cough, phlegm production, chest pain, or palpitations. He denies any abnormal headaches, dizziness, or change in vision. His energy level is fair. He has some occasional back pain, but denies any additional myalgias, arthralgias, or bony pain.  A detailed review of systems is otherwise noncontributory.  PAST MEDICAL HISTORY: Past Medical History  Diagnosis Date  . Hypertension   . Thyroid disease   . LBBB (left bundle branch block)   . Diabetes mellitus without complication   . H/O colonoscopy   . Anemia   . Cancer     PAST SURGICAL HISTORY: Past Surgical History  Procedure Laterality Date  . Tonsillectomy    . Hiatal hernia repair    .  Hernia repair  1979    bilateral inguinal hernia  . Thyroid lobectomy  6/85    right  . Acromionectomy  1998    rotator cuff repair  . Prostate surgery    . Abdominal aortic aneurysm repair      FAMILY HISTORY Family History  Problem Relation Age of Onset  . Heart disease Mother   . Heart disease Father   The patient's father died from complications of emphysema in his 25s.  His mother's history was essentially the same.  The patient has one sister who died from scleroderma complications.    SOCIAL HISTORY: He used to work at a Insurance claims handler.   Because of peripheral neuropathy symptoms, he has been worked up for heavy metal exposure but that has been normal, he said.  He has been married 70 years to his wife, Alona Bene, who now has severe dementia but still lives at home.  The patient's children are Michail Jewels who lives in [place] and used to teach dancing, and Valor Quaintance who lives in [place] and runs a Chief Operating Officer.  The patient has two grandchildren and five great-grandchildren.  He is a International aid/development worker.  ADVANCED DIRECTIVES:  HEALTH MAINTENANCE: History  Substance Use Topics  . Smoking status: Former Games developer  . Smokeless tobacco: Not on file  . Alcohol Use: No     Colonoscopy:  Lipid panel:  Allergies  Allergen Reactions  . Penicillins   . Codeine Sulfate     Current Outpatient Prescriptions  Medication Sig Dispense Refill  . acetaminophen (TYLENOL) 325 MG tablet Take 650 mg by mouth every 6 (six) hours as needed.      Marland Kitchen alfuzosin (UROXATRAL) 10 MG 24 hr tablet Take 10 mg by mouth daily.      . fish oil-omega-3 fatty acids 1000 MG capsule Take 2 g by mouth daily.      Marland Kitchen levothyroxine (SYNTHROID, LEVOTHROID) 125 MCG tablet Take 125 mcg by mouth daily.        . Lisinopril (ZESTRIL PO) Take by mouth. Unknown dose       . loratadine (CLARITIN) 10 MG tablet Take 10 mg by mouth daily.        . Multiple Vitamin (MULTIVITAMIN) capsule Take 1 capsule by mouth daily.        . Multiple Vitamins-Minerals (MULTIVITAMIN WITH MINERALS) tablet Take 1 tablet by mouth daily.      Marland Kitchen UNKNOWN TO PATIENT Injection for anemia every 3 weeks..patient can't remember name      . metFORMIN (GLUCOPHAGE) 500 MG tablet Take 500 mg by mouth 2 (two) times daily with a meal.       No current facility-administered medications for this visit.    OBJECTIVE: Elderly white male who appears comfortable and in no acute distress Filed Vitals:   09/27/12 1104  BP: 149/61  Pulse: 56  Temp: 97.4 F (36.3 C)  Resp: 18     Body mass index is 24.49 kg/(m^2).     ECOG FS: 1 Filed Weights   09/27/12 1104  Weight: 165 lb 14.4 oz (75.252 kg)    Sclerae unicteric No cervical or supraclavicular adenopathy Lungs clear to auscultation bilaterally with no rales or rhonchi Heart regular rate and rhythm Abdomen soft, nontender, positive bowel sounds No peripheral edema Neuro: nonfocal, well oriented with positive affect   LAB RESULTS: Lab Results  Component Value Date   WBC 5.1 09/27/2012   NEUTROABS 3.0 09/27/2012   HGB 9.1* 09/27/2012   HCT 26.7* 09/27/2012  MCV 111.8* 09/27/2012   PLT 101* 09/27/2012      Chemistry      Component Value Date/Time   NA 140 04/08/2009 0851   K 4.6 04/08/2009 0851   CL 107 04/08/2009 0851   CO2 24 04/08/2009 0851   BUN 27* 04/08/2009 0851   CREATININE 1.34 04/08/2009 0851      Component Value Date/Time   CALCIUM 9.4 04/08/2009 0851   ALKPHOS 53 04/08/2009 0851   AST 16 04/08/2009 0851   ALT 17 04/08/2009 0851   BILITOT 0.8 04/08/2009 0851      STUDIES: No results found.   ASSESSMENT: 79 y.o.  [place] man with history of refractory anemia with ring sideroblasts, initially diagnosed June of 2006. Also with anemia of renal failure. Your stable on chronic EPO supplementation given every 3 weeks.  PLAN: Mr. Finigan will receive his next injection of erythropoietin today, and we will continue with labs and injections every 3 weeks (on Thursdays per his request) for the next year. He'll see Dr. Darnelle Catalan in one year, but knows to call with any changes or problems prior to that visit.  Terre Hanneman    09/27/2012

## 2012-10-09 ENCOUNTER — Encounter (INDEPENDENT_AMBULATORY_CARE_PROVIDER_SITE_OTHER): Payer: Self-pay

## 2012-10-17 ENCOUNTER — Telehealth: Payer: Self-pay | Admitting: *Deleted

## 2012-10-17 ENCOUNTER — Other Ambulatory Visit: Payer: Medicare Other | Admitting: Lab

## 2012-10-17 ENCOUNTER — Ambulatory Visit: Payer: Medicare Other

## 2012-10-17 ENCOUNTER — Telehealth: Payer: Self-pay | Admitting: Oncology

## 2012-10-17 NOTE — Telephone Encounter (Signed)
Called patient at home and spoke to wife.  Patient was not available at this time.  Left message with wife for him to call back and reschedule appointment.

## 2012-10-18 ENCOUNTER — Other Ambulatory Visit (HOSPITAL_BASED_OUTPATIENT_CLINIC_OR_DEPARTMENT_OTHER): Payer: Medicare Other | Admitting: Lab

## 2012-10-18 ENCOUNTER — Other Ambulatory Visit: Payer: Medicare Other | Admitting: Lab

## 2012-10-18 ENCOUNTER — Ambulatory Visit: Payer: Medicare Other

## 2012-10-18 ENCOUNTER — Ambulatory Visit (HOSPITAL_BASED_OUTPATIENT_CLINIC_OR_DEPARTMENT_OTHER): Payer: Medicare Other

## 2012-10-18 VITALS — BP 146/75 | HR 111 | Temp 98.2°F

## 2012-10-18 DIAGNOSIS — N189 Chronic kidney disease, unspecified: Secondary | ICD-10-CM

## 2012-10-18 DIAGNOSIS — D638 Anemia in other chronic diseases classified elsewhere: Secondary | ICD-10-CM

## 2012-10-18 DIAGNOSIS — D631 Anemia in chronic kidney disease: Secondary | ICD-10-CM

## 2012-10-18 DIAGNOSIS — N039 Chronic nephritic syndrome with unspecified morphologic changes: Secondary | ICD-10-CM

## 2012-10-18 LAB — CBC WITH DIFFERENTIAL/PLATELET
BASO%: 1.1 % (ref 0.0–2.0)
EOS%: 1.9 % (ref 0.0–7.0)
MCH: 36.8 pg — ABNORMAL HIGH (ref 27.2–33.4)
MCHC: 33 g/dL (ref 32.0–36.0)
NEUT%: 60.7 % (ref 39.0–75.0)
RBC: 2.56 10*6/uL — ABNORMAL LOW (ref 4.20–5.82)
RDW: 23.5 % — ABNORMAL HIGH (ref 11.0–14.6)
WBC: 4.9 10*3/uL (ref 4.0–10.3)
lymph#: 1.3 10*3/uL (ref 0.9–3.3)

## 2012-10-18 LAB — TECHNOLOGIST REVIEW

## 2012-10-18 MED ORDER — EPOETIN ALFA 40000 UNIT/ML IJ SOLN
40000.0000 [IU] | Freq: Once | INTRAMUSCULAR | Status: AC
  Administered 2012-10-18: 40000 [IU] via SUBCUTANEOUS
  Filled 2012-10-18: qty 1

## 2012-11-07 ENCOUNTER — Ambulatory Visit (HOSPITAL_BASED_OUTPATIENT_CLINIC_OR_DEPARTMENT_OTHER): Payer: Medicare Other

## 2012-11-07 ENCOUNTER — Other Ambulatory Visit (HOSPITAL_BASED_OUTPATIENT_CLINIC_OR_DEPARTMENT_OTHER): Payer: Medicare Other | Admitting: Lab

## 2012-11-07 VITALS — BP 141/46 | HR 88 | Temp 98.4°F

## 2012-11-07 DIAGNOSIS — D631 Anemia in chronic kidney disease: Secondary | ICD-10-CM

## 2012-11-07 DIAGNOSIS — N189 Chronic kidney disease, unspecified: Secondary | ICD-10-CM

## 2012-11-07 DIAGNOSIS — D638 Anemia in other chronic diseases classified elsewhere: Secondary | ICD-10-CM

## 2012-11-07 LAB — CBC WITH DIFFERENTIAL/PLATELET
Eosinophils Absolute: 0.1 10*3/uL (ref 0.0–0.5)
MCV: 110.9 fL — ABNORMAL HIGH (ref 79.3–98.0)
MONO%: 9.2 % (ref 0.0–14.0)
NEUT#: 2.8 10*3/uL (ref 1.5–6.5)
RBC: 2.39 10*6/uL — ABNORMAL LOW (ref 4.20–5.82)
RDW: 22.1 % — ABNORMAL HIGH (ref 11.0–14.6)
WBC: 4.4 10*3/uL (ref 4.0–10.3)
lymph#: 1 10*3/uL (ref 0.9–3.3)

## 2012-11-07 MED ORDER — EPOETIN ALFA 40000 UNIT/ML IJ SOLN
40000.0000 [IU] | Freq: Once | INTRAMUSCULAR | Status: AC
  Administered 2012-11-07: 40000 [IU] via SUBCUTANEOUS
  Filled 2012-11-07: qty 1

## 2012-11-26 ENCOUNTER — Emergency Department (HOSPITAL_COMMUNITY)
Admission: EM | Admit: 2012-11-26 | Discharge: 2012-11-26 | Disposition: A | Discharge status: Home / Self Care | Payer: Medicare Other | Attending: Emergency Medicine | Admitting: Emergency Medicine

## 2012-11-26 ENCOUNTER — Encounter (HOSPITAL_COMMUNITY): Payer: Self-pay | Admitting: Emergency Medicine

## 2012-11-26 DIAGNOSIS — E079 Disorder of thyroid, unspecified: Secondary | ICD-10-CM | POA: Insufficient documentation

## 2012-11-26 DIAGNOSIS — Z87891 Personal history of nicotine dependence: Secondary | ICD-10-CM | POA: Insufficient documentation

## 2012-11-26 DIAGNOSIS — Z880 Allergy status to penicillin: Secondary | ICD-10-CM | POA: Insufficient documentation

## 2012-11-26 DIAGNOSIS — Z79899 Other long term (current) drug therapy: Secondary | ICD-10-CM | POA: Insufficient documentation

## 2012-11-26 DIAGNOSIS — K645 Perianal venous thrombosis: Secondary | ICD-10-CM | POA: Insufficient documentation

## 2012-11-26 DIAGNOSIS — I10 Essential (primary) hypertension: Secondary | ICD-10-CM | POA: Insufficient documentation

## 2012-11-26 DIAGNOSIS — Z8679 Personal history of other diseases of the circulatory system: Secondary | ICD-10-CM | POA: Insufficient documentation

## 2012-11-26 DIAGNOSIS — Z8546 Personal history of malignant neoplasm of prostate: Secondary | ICD-10-CM | POA: Insufficient documentation

## 2012-11-26 DIAGNOSIS — E119 Type 2 diabetes mellitus without complications: Secondary | ICD-10-CM | POA: Insufficient documentation

## 2012-11-26 DIAGNOSIS — D649 Anemia, unspecified: Secondary | ICD-10-CM | POA: Insufficient documentation

## 2012-11-26 DIAGNOSIS — K625 Hemorrhage of anus and rectum: Secondary | ICD-10-CM

## 2012-11-26 LAB — POCT I-STAT, CHEM 8
BUN: 43 mg/dL — ABNORMAL HIGH (ref 6–23)
Calcium, Ion: 1.13 mmol/L (ref 1.13–1.30)
Glucose, Bld: 106 mg/dL — ABNORMAL HIGH (ref 70–99)
HCT: 32 % — ABNORMAL LOW (ref 39.0–52.0)
TCO2: 23 mmol/L (ref 0–100)

## 2012-11-26 LAB — OCCULT BLOOD, POC DEVICE: Fecal Occult Bld: POSITIVE — AB

## 2012-11-26 NOTE — ED Notes (Signed)
Pt c/o of rectal bleeding that started about an hour ago while having a BM. States that he noticed bright red blood. Denies abd pain, nausea, vomiting.  Hx of anemia. NAD at this time.

## 2012-11-26 NOTE — ED Provider Notes (Signed)
History     CSN: [id number]  Arrival date & time 11/26/12  1641   First MD Initiated Contact with Patient 11/26/12 1656      Chief Complaint  Patient presents with  . Rectal Bleeding    (Consider location/radiation/quality/duration/timing/severity/associated sxs/prior treatment) HPI  79 year old male presents emergency department chief complaint of rectal bleeding.  The past medical history of prostate cancer, diabetes, thyroid disease and hypertension, left bundle branch block and chronic anemia.  He states that he is at the problem with chronic constipation.  The patient has been having intermittent rectal bleeding that he describes as spotting for about the past month. This morning patient was a good bowel movement and noticed some blood on his the paper.  When he stood up to grab him over to the paper he noticed a puddle of blood on the floor.  He has been wearing to the paper as a packing and has active bleeding in his underwear.  He denies any racing heart, dizziness, cold intolerance, symptoms of orthostasis.  He denies any pain in the rectum or pain with defecation. Denies fevers, chills, myalgias, arthralgias. Denies DOE, SOB, chest tightness or pressure, radiation to left arm, jaw or back, or diaphoresis. Denies dysuria, flank pain, suprapubic pain, frequency, urgency, or hematuria. Denies headaches, light headedness, weakness, visual disturbances. Denies abdominal pain, nausea, vomiting, diarrhea.     Past Medical History  Diagnosis Date  . Hypertension   . Thyroid disease   . LBBB (left bundle branch block)   . Diabetes mellitus without complication   . H/O colonoscopy   . Anemia   . Cancer     Past Surgical History  Procedure Laterality Date  . Tonsillectomy    . Hiatal hernia repair    . Hernia repair  1979    bilateral inguinal hernia  . Thyroid lobectomy  6/85    right  . Acromionectomy  1998    rotator cuff repair  . Prostate surgery    . Abdominal aortic  aneurysm repair      Family History  Problem Relation Age of Onset  . Heart disease Mother   . Heart disease Father     History  Substance Use Topics  . Smoking status: Former Games developer  . Smokeless tobacco: Not on file  . Alcohol Use: No      Review of Systems Ten systems reviewed and are negative for acute change, except as noted in the HPI.   Allergies  Penicillins and Codeine sulfate  Home Medications   Current Outpatient Rx  Name  Route  Sig  Dispense  Refill  . alfuzosin (UROXATRAL) 10 MG 24 hr tablet   Oral   Take 10 mg by mouth every evening.          Marland Kitchen levothyroxine (SYNTHROID, LEVOTHROID) 112 MCG tablet   Oral   Take 112 mcg by mouth every morning.         Marland Kitchen lisinopril (PRINIVIL,ZESTRIL) 10 MG tablet   Oral   Take 10 mg by mouth every morning.         . Multiple Vitamin (MULTIVITAMIN WITH MINERALS) TABS   Oral   Take 1 tablet by mouth every morning.         . pyridOXINE (VITAMIN B-6) 100 MG tablet   Oral   Take 100 mg by mouth every morning.         Marland Kitchen UNKNOWN TO PATIENT      Injection for anemia every 3 weeks..patient  can't remember name           BP 170/78  Pulse 107  Temp(Src) 97.6 F (36.4 C) (Oral)  Resp 20  SpO2 97%  Physical Exam  Nursing note and vitals reviewed. Constitutional: He appears well-developed and well-nourished. No distress.  HENT:  Head: Normocephalic and atraumatic.  Eyes: Conjunctivae are normal. No scleral icterus.  Neck: Normal range of motion. Neck supple.  Cardiovascular: Normal rate, regular rhythm and normal heart sounds.   Pulmonary/Chest: Effort normal and breath sounds normal. No respiratory distress.  Abdominal: Soft. There is no tenderness.  Genitourinary:  There is blood on the patient's underwear. \Digital Rectal Exam reveals sphincter with good tone.  Nontender thrombosed external hemorrhoid.   No masses or fissures. Stool color is brown with bloody streaking. Hemoccult is postive. Patient  is actively bleeding on to the chuck. Unable to palpate internal hemorrhoid.   Musculoskeletal: He exhibits no edema.  Neurological: He is alert.  Skin: Skin is warm and dry. He is not diaphoretic.  Psychiatric: His behavior is normal.    ED Course  Procedures (including critical care time)  Labs Reviewed  OCCULT BLOOD, POC DEVICE - Abnormal; Notable for the following:    Fecal Occult Bld POSITIVE (*)    All other components within normal limits  POCT I-STAT, CHEM 8 - Abnormal; Notable for the following:    BUN 43 (*)    Creatinine, Ser 1.40 (*)    Glucose, Bld 106 (*)    Hemoglobin 10.9 (*)    HCT 32.0 (*)    All other components within normal limits  OCCULT BLOOD X 1 CARD TO LAB, STOOL   No results found.   1. Rectal bleeding       MDM  5:41 PM BP 170/78  Pulse 107  Temp(Src) 97.6 F (36.4 C) (Oral)  Resp 20  SpO2 97% Patient with active rectal bleeding . He has chronic anemia, and his HGB appears to be a gram higher than normal. I have discussed the case with my attending physician Dr. Iantha Fallen who feels the patient is a candidate for obs admission and serial checks of his hct.    I have spoken with Dr. Mahala Menghini. The patient, although he did have an increase in his pulse of 20 points was asymptomatic with his orhtostatic exam. He has follow up with his physician tomorrow and does not appear to have anymore active bleeding. I feel the patient is safe to discharge with strong return precautions that include return for increased or worsening bleeding or for symptoms of orthostatis.     Arthor Captain, PA-C 11/27/12 0126

## 2012-11-26 NOTE — ED Notes (Signed)
Bed:WA14<BR> Expected date:<BR> Expected time:<BR> Means of arrival:<BR> Comments:<BR>

## 2012-11-28 ENCOUNTER — Other Ambulatory Visit: Payer: Medicare Other | Admitting: Lab

## 2012-11-28 ENCOUNTER — Ambulatory Visit (HOSPITAL_BASED_OUTPATIENT_CLINIC_OR_DEPARTMENT_OTHER): Payer: Medicare Other

## 2012-11-28 VITALS — BP 109/84 | HR 84 | Temp 98.6°F

## 2012-11-28 DIAGNOSIS — N189 Chronic kidney disease, unspecified: Secondary | ICD-10-CM

## 2012-11-28 DIAGNOSIS — N039 Chronic nephritic syndrome with unspecified morphologic changes: Secondary | ICD-10-CM

## 2012-11-28 DIAGNOSIS — D638 Anemia in other chronic diseases classified elsewhere: Secondary | ICD-10-CM

## 2012-11-28 MED ORDER — EPOETIN ALFA 40000 UNIT/ML IJ SOLN
40000.0000 [IU] | Freq: Once | INTRAMUSCULAR | Status: AC
  Administered 2012-11-28: 40000 [IU] via SUBCUTANEOUS
  Filled 2012-11-28: qty 1

## 2012-11-29 NOTE — ED Provider Notes (Signed)
Medical screening examination/treatment/procedure(s) were conducted as a shared visit with non-physician practitioner(s) and myself.  I personally evaluated the patient during the encounter  Pt with rectal bleeding.  External hemorrhoid noted. No active bleeding in the ED.  Stable labs.  Discussed with hospitalist regarding admission who recommended close outpatient follow up.  Celene Kras, MD 11/29/12 [phone]

## 2012-12-04 ENCOUNTER — Other Ambulatory Visit: Payer: Self-pay | Admitting: Gastroenterology

## 2012-12-13 ENCOUNTER — Ambulatory Visit (HOSPITAL_COMMUNITY)
Admission: RE | Admit: 2012-12-13 | Discharge: 2012-12-13 | Disposition: A | Discharge status: Home / Self Care | Payer: Medicare Other | Source: Ambulatory Visit | Attending: Gastroenterology | Admitting: Gastroenterology

## 2012-12-13 ENCOUNTER — Encounter (HOSPITAL_COMMUNITY)
Admission: RE | Disposition: A | Discharge status: Home / Self Care | Payer: Self-pay | Source: Ambulatory Visit | Attending: Gastroenterology

## 2012-12-13 ENCOUNTER — Encounter (HOSPITAL_COMMUNITY): Payer: Self-pay | Admitting: *Deleted

## 2012-12-13 DIAGNOSIS — K644 Residual hemorrhoidal skin tags: Secondary | ICD-10-CM | POA: Insufficient documentation

## 2012-12-13 DIAGNOSIS — I10 Essential (primary) hypertension: Secondary | ICD-10-CM | POA: Insufficient documentation

## 2012-12-13 DIAGNOSIS — K620 Anal polyp: Secondary | ICD-10-CM | POA: Insufficient documentation

## 2012-12-13 DIAGNOSIS — K921 Melena: Secondary | ICD-10-CM | POA: Insufficient documentation

## 2012-12-13 DIAGNOSIS — I447 Left bundle-branch block, unspecified: Secondary | ICD-10-CM | POA: Insufficient documentation

## 2012-12-13 DIAGNOSIS — E119 Type 2 diabetes mellitus without complications: Secondary | ICD-10-CM | POA: Insufficient documentation

## 2012-12-13 DIAGNOSIS — Y842 Radiological procedure and radiotherapy as the cause of abnormal reaction of the patient, or of later complication, without mention of misadventure at the time of the procedure: Secondary | ICD-10-CM | POA: Insufficient documentation

## 2012-12-13 DIAGNOSIS — D509 Iron deficiency anemia, unspecified: Secondary | ICD-10-CM | POA: Insufficient documentation

## 2012-12-13 DIAGNOSIS — K6289 Other specified diseases of anus and rectum: Secondary | ICD-10-CM | POA: Insufficient documentation

## 2012-12-13 DIAGNOSIS — K621 Rectal polyp: Secondary | ICD-10-CM | POA: Insufficient documentation

## 2012-12-13 DIAGNOSIS — E039 Hypothyroidism, unspecified: Secondary | ICD-10-CM | POA: Insufficient documentation

## 2012-12-13 DIAGNOSIS — K648 Other hemorrhoids: Secondary | ICD-10-CM | POA: Insufficient documentation

## 2012-12-13 DIAGNOSIS — D126 Benign neoplasm of colon, unspecified: Secondary | ICD-10-CM | POA: Insufficient documentation

## 2012-12-13 HISTORY — DX: Hypothyroidism, unspecified: E03.9

## 2012-12-13 HISTORY — DX: Unspecified cataract: H26.9

## 2012-12-13 HISTORY — PX: HOT HEMOSTASIS: SHX5433

## 2012-12-13 HISTORY — DX: Unilateral inguinal hernia, without obstruction or gangrene, not specified as recurrent: K40.90

## 2012-12-13 HISTORY — PX: FLEXIBLE SIGMOIDOSCOPY: SHX5431

## 2012-12-13 SURGERY — SIGMOIDOSCOPY, FLEXIBLE
Anesthesia: Moderate Sedation

## 2012-12-13 MED ORDER — SODIUM CHLORIDE 0.9 % IV SOLN: INTRAVENOUS | Status: DC

## 2012-12-13 MED ORDER — FENTANYL CITRATE 0.05 MG/ML IJ SOLN
INTRAMUSCULAR | Status: AC
  Filled 2012-12-13: qty 2

## 2012-12-13 MED ORDER — MIDAZOLAM HCL 10 MG/2ML IJ SOLN
INTRAMUSCULAR | Status: DC | PRN
  Administered 2012-12-13: 2 mg via INTRAVENOUS

## 2012-12-13 MED ORDER — MIDAZOLAM HCL 10 MG/2ML IJ SOLN
INTRAMUSCULAR | Status: AC
  Filled 2012-12-13: qty 2

## 2012-12-13 NOTE — Op Note (Signed)
[facility] [address] [place], [address]   FLEXIBLE SIGMOIDOSCOPY PROCEDURE REPORT  PATIENT: Anthony Hutchinson, Anthony Hutchinson  MR#: [id number] BIRTHDATE: 1922/05/06 , 89  yrs. old GENDER: Male ENDOSCOPIST: Jeani Hawking, MD REFERRED BY: PROCEDURE DATE:  12/13/2012 PROCEDURE:   Sigmoidoscopy with snare ASA CLASS:   Class IV INDICATIONS:hematochezia. MEDICATIONS: Versed 2 mg IV  DESCRIPTION OF PROCEDURE:   After the risks benefits and alternatives of the procedure were thoroughly explained, informed consent was obtained.  revealed no abnormalities of the rectum. The endoscope was introduced through the anus  and advanced to the sigmoid colon , limited by No adverse events experienced.   The quality of the prep was excellent .  The instrument was then slowly withdrawn as the mucosa was fully examined.         FINDINGS: Upon initial entry into the rectum there was evidence of some fresh blood.  Retroflexion revealed a mild/moderate radiation proctitis.  The sites were treated with APC.  No proximal source of bleeding was noted as the stool was not mixed with blood.  A 3 mm sessile rectal polyp was removed with a cold snare. Retroflexed views revealed internal/external hemorrhoid.    The scope was then withdrawn from the patient and the procedure terminated.  COMPLICATIONS: There were no complications.  ENDOSCOPIC IMPRESSION: 1) Radiation proctitis s/p APC. 2) Polyp  RECOMMENDATIONS: 1) Await biopsy results. 2) Follow up in the office in 1 month.  REPEAT EXAM:   _______________________________ eSignedJeani Hawking, MD 12/13/2012 12:24 PM   CC:

## 2012-12-13 NOTE — H&P (Signed)
Reason for Consult: Hematochezia Referring Physician: Jeanette Caprice, M.D.  Otelia Sergeant HPI: The patient has a history of radiation proctitis and two months ago he presented with painless hematochezia.  The bleeding is intermittent.  His radiation proctitis was identified in 2006 by Dr. Virginia Rochester.  Three years ago he was supposed to undergo an EGD/Colonoscopy for findings of anemia, but he never underwent the procedures.  His HGB has remained stable during the interim time period.  Additionally, he has become more frail medically.  Past Medical History  Diagnosis Date  . Hypertension   . Thyroid disease   . LBBB (left bundle branch block)   . Diabetes mellitus without complication   . H/O colonoscopy   . Cancer   . Hypothyroidism     nodule  . Cataracts, bilateral   . Anemia     iron deficiency anemia  . Inguinal hernia     Past Surgical History  Procedure Laterality Date  . Tonsillectomy    . Hiatal hernia repair    . Hernia repair  1979    bilateral inguinal hernia  . Thyroid lobectomy  6/85    right  . Acromionectomy  1998    rotator cuff repair  . Prostate surgery    . Abdominal aortic aneurysm repair    . Tonsillectomy      Family History  Problem Relation Age of Onset  . Heart disease Mother   . Heart disease Father     Social History:  reports that he quit smoking about 49 years ago. His smoking use included Pipe. He has never used smokeless tobacco. He reports that he does not drink alcohol or use illicit drugs.  Allergies:  Allergies  Allergen Reactions  . Penicillins   . Codeine Sulfate     Medications:  Scheduled:  Continuous: . sodium chloride      No results found for this or any previous visit (from the past 24 hour(s)).   No results found.  ROS:  As stated above in the HPI otherwise negative.  Blood pressure 123/81, temperature 97.6 F (36.4 C), temperature source Oral, resp. rate 18, height 5\' 6"  (1.676 m), weight 159 lb (72.122 kg), SpO2  100.00%.    PE: Gen: NAD, Alert and Oriented HEENT:  [place]/AT, EOMI Neck: Supple, no LAD Lungs: CTA Bilaterally CV: RRR without M/G/R ABM: Soft, NTND, +BS Ext: No C/C/E  Assessment/Plan: 1) Radiation proctitis. 2) Hematochezia.   My presumption is that he is bleeding from his radiation proctitis.  I discussed the situation in detail with the patient when he was in the office about an EGD/Colonoscopy.  Since he has become more frail, I thought the risks out weighed the benefits, however, I will pursue an unsedated FFS.  I will treat the radiation proctitis if necessary with APC.  Plan: 1) FFS with possible APC.  Zuha Dejonge D 12/13/2012, 11:41 AM

## 2012-12-16 ENCOUNTER — Encounter (HOSPITAL_COMMUNITY): Payer: Self-pay | Admitting: Gastroenterology

## 2012-12-16 ENCOUNTER — Telehealth: Payer: Self-pay | Admitting: Dietician

## 2012-12-19 ENCOUNTER — Other Ambulatory Visit (HOSPITAL_BASED_OUTPATIENT_CLINIC_OR_DEPARTMENT_OTHER): Payer: Medicare Other | Admitting: Lab

## 2012-12-19 ENCOUNTER — Ambulatory Visit (HOSPITAL_BASED_OUTPATIENT_CLINIC_OR_DEPARTMENT_OTHER): Payer: Medicare Other

## 2012-12-19 ENCOUNTER — Other Ambulatory Visit: Payer: Self-pay | Admitting: Oncology

## 2012-12-19 VITALS — BP 125/44 | HR 58 | Temp 98.1°F

## 2012-12-19 DIAGNOSIS — N189 Chronic kidney disease, unspecified: Secondary | ICD-10-CM

## 2012-12-19 DIAGNOSIS — D631 Anemia in chronic kidney disease: Secondary | ICD-10-CM

## 2012-12-19 DIAGNOSIS — N039 Chronic nephritic syndrome with unspecified morphologic changes: Secondary | ICD-10-CM

## 2012-12-19 DIAGNOSIS — D638 Anemia in other chronic diseases classified elsewhere: Secondary | ICD-10-CM

## 2012-12-19 LAB — CBC WITH DIFFERENTIAL/PLATELET
BASO%: 1.4 % (ref 0.0–2.0)
Basophils Absolute: 0.1 10*3/uL (ref 0.0–0.1)
EOS%: 2.1 % (ref 0.0–7.0)
HGB: 8 g/dL — ABNORMAL LOW (ref 13.0–17.1)
MCH: 39.6 pg — ABNORMAL HIGH (ref 27.2–33.4)
MCHC: 35.1 g/dL (ref 32.0–36.0)
MCV: 112.7 fL — ABNORMAL HIGH (ref 79.3–98.0)
MONO%: 9.7 % (ref 0.0–14.0)
RBC: 2.03 10*6/uL — ABNORMAL LOW (ref 4.20–5.82)
RDW: 22.2 % — ABNORMAL HIGH (ref 11.0–14.6)
lymph#: 1 10*3/uL (ref 0.9–3.3)

## 2012-12-19 LAB — TECHNOLOGIST REVIEW

## 2012-12-19 MED ORDER — EPOETIN ALFA 40000 UNIT/ML IJ SOLN
40000.0000 [IU] | Freq: Once | INTRAMUSCULAR | Status: AC
  Administered 2012-12-19: 40000 [IU] via SUBCUTANEOUS
  Filled 2012-12-19: qty 1

## 2013-01-09 ENCOUNTER — Other Ambulatory Visit (HOSPITAL_BASED_OUTPATIENT_CLINIC_OR_DEPARTMENT_OTHER): Payer: Medicare Other

## 2013-01-09 ENCOUNTER — Other Ambulatory Visit: Payer: Self-pay | Admitting: *Deleted

## 2013-01-09 ENCOUNTER — Ambulatory Visit (HOSPITAL_BASED_OUTPATIENT_CLINIC_OR_DEPARTMENT_OTHER): Payer: Medicare Other

## 2013-01-09 VITALS — BP 133/69 | HR 72 | Temp 98.2°F

## 2013-01-09 DIAGNOSIS — D631 Anemia in chronic kidney disease: Secondary | ICD-10-CM

## 2013-01-09 DIAGNOSIS — D638 Anemia in other chronic diseases classified elsewhere: Secondary | ICD-10-CM

## 2013-01-09 DIAGNOSIS — N039 Chronic nephritic syndrome with unspecified morphologic changes: Secondary | ICD-10-CM

## 2013-01-09 DIAGNOSIS — N189 Chronic kidney disease, unspecified: Secondary | ICD-10-CM

## 2013-01-09 LAB — CBC WITH DIFFERENTIAL/PLATELET
BASO%: 1.1 % (ref 0.0–2.0)
Eosinophils Absolute: 0.2 10*3/uL (ref 0.0–0.5)
LYMPH%: 15 % (ref 14.0–49.0)
MCHC: 35.2 g/dL (ref 32.0–36.0)
MONO#: 0.8 10*3/uL (ref 0.1–0.9)
NEUT#: 5.7 10*3/uL (ref 1.5–6.5)
Platelets: 109 10*3/uL — ABNORMAL LOW (ref 140–400)
RBC: 2.28 10*6/uL — ABNORMAL LOW (ref 4.20–5.82)
RDW: 20.7 % — ABNORMAL HIGH (ref 11.0–14.6)
WBC: 8.1 10*3/uL (ref 4.0–10.3)

## 2013-01-09 MED ORDER — EPOETIN ALFA 40000 UNIT/ML IJ SOLN
40000.0000 [IU] | Freq: Once | INTRAMUSCULAR | Status: AC
  Administered 2013-01-09: 40000 [IU] via SUBCUTANEOUS
  Filled 2013-01-09: qty 1

## 2013-01-30 ENCOUNTER — Other Ambulatory Visit (HOSPITAL_COMMUNITY): Payer: Self-pay | Admitting: Oncology

## 2013-01-30 ENCOUNTER — Other Ambulatory Visit (HOSPITAL_BASED_OUTPATIENT_CLINIC_OR_DEPARTMENT_OTHER): Payer: Medicare Other | Admitting: Lab

## 2013-01-30 ENCOUNTER — Ambulatory Visit (HOSPITAL_BASED_OUTPATIENT_CLINIC_OR_DEPARTMENT_OTHER): Payer: Medicare Other

## 2013-01-30 VITALS — BP 124/43 | HR 53 | Temp 98.1°F

## 2013-01-30 DIAGNOSIS — D631 Anemia in chronic kidney disease: Secondary | ICD-10-CM

## 2013-01-30 DIAGNOSIS — D638 Anemia in other chronic diseases classified elsewhere: Secondary | ICD-10-CM

## 2013-01-30 DIAGNOSIS — N039 Chronic nephritic syndrome with unspecified morphologic changes: Secondary | ICD-10-CM

## 2013-01-30 DIAGNOSIS — N189 Chronic kidney disease, unspecified: Secondary | ICD-10-CM

## 2013-01-30 LAB — CBC WITH DIFFERENTIAL/PLATELET
BASO%: 1 % (ref 0.0–2.0)
Eosinophils Absolute: 0.2 10*3/uL (ref 0.0–0.5)
HCT: 27.6 % — ABNORMAL LOW (ref 38.4–49.9)
MCHC: 34.3 g/dL (ref 32.0–36.0)
MONO#: 0.5 10*3/uL (ref 0.1–0.9)
NEUT#: 3.1 10*3/uL (ref 1.5–6.5)
NEUT%: 60.9 % (ref 39.0–75.0)
RBC: 2.49 10*6/uL — ABNORMAL LOW (ref 4.20–5.82)
WBC: 5.1 10*3/uL (ref 4.0–10.3)
lymph#: 1.3 10*3/uL (ref 0.9–3.3)

## 2013-01-30 MED ORDER — EPOETIN ALFA 40000 UNIT/ML IJ SOLN
40000.0000 [IU] | Freq: Once | INTRAMUSCULAR | Status: AC
  Administered 2013-01-30: 40000 [IU] via SUBCUTANEOUS
  Filled 2013-01-30: qty 1

## 2013-02-12 ENCOUNTER — Other Ambulatory Visit: Payer: Self-pay

## 2013-02-20 ENCOUNTER — Other Ambulatory Visit: Payer: Medicare Other | Admitting: Lab

## 2013-02-20 ENCOUNTER — Ambulatory Visit: Payer: Medicare Other

## 2013-02-21 ENCOUNTER — Ambulatory Visit (HOSPITAL_BASED_OUTPATIENT_CLINIC_OR_DEPARTMENT_OTHER): Payer: Medicare Other

## 2013-02-21 ENCOUNTER — Other Ambulatory Visit (HOSPITAL_BASED_OUTPATIENT_CLINIC_OR_DEPARTMENT_OTHER): Payer: Medicare Other

## 2013-02-21 VITALS — BP 120/53 | HR 82 | Temp 98.3°F

## 2013-02-21 DIAGNOSIS — N039 Chronic nephritic syndrome with unspecified morphologic changes: Secondary | ICD-10-CM

## 2013-02-21 DIAGNOSIS — D631 Anemia in chronic kidney disease: Secondary | ICD-10-CM

## 2013-02-21 DIAGNOSIS — N189 Chronic kidney disease, unspecified: Secondary | ICD-10-CM

## 2013-02-21 DIAGNOSIS — D638 Anemia in other chronic diseases classified elsewhere: Secondary | ICD-10-CM

## 2013-02-21 LAB — CBC WITH DIFFERENTIAL/PLATELET
Basophils Absolute: 0 10*3/uL (ref 0.0–0.1)
HCT: 26.1 % — ABNORMAL LOW (ref 38.4–49.9)
HGB: 8.9 g/dL — ABNORMAL LOW (ref 13.0–17.1)
MONO#: 0.5 10*3/uL (ref 0.1–0.9)
NEUT%: 60.4 % (ref 39.0–75.0)
Platelets: 88 10*3/uL — ABNORMAL LOW (ref 140–400)
WBC: 5.6 10*3/uL (ref 4.0–10.3)
lymph#: 1.6 10*3/uL (ref 0.9–3.3)

## 2013-02-21 MED ORDER — EPOETIN ALFA 40000 UNIT/ML IJ SOLN
40000.0000 [IU] | Freq: Once | INTRAMUSCULAR | Status: AC
  Administered 2013-02-21: 40000 [IU] via SUBCUTANEOUS
  Filled 2013-02-21: qty 1

## 2013-03-13 ENCOUNTER — Other Ambulatory Visit (HOSPITAL_BASED_OUTPATIENT_CLINIC_OR_DEPARTMENT_OTHER): Payer: Medicare Other | Admitting: Lab

## 2013-03-13 ENCOUNTER — Ambulatory Visit (HOSPITAL_BASED_OUTPATIENT_CLINIC_OR_DEPARTMENT_OTHER): Payer: Medicare Other

## 2013-03-13 VITALS — BP 118/45 | HR 65 | Temp 98.1°F

## 2013-03-13 DIAGNOSIS — N189 Chronic kidney disease, unspecified: Secondary | ICD-10-CM

## 2013-03-13 DIAGNOSIS — D638 Anemia in other chronic diseases classified elsewhere: Secondary | ICD-10-CM

## 2013-03-13 DIAGNOSIS — D631 Anemia in chronic kidney disease: Secondary | ICD-10-CM

## 2013-03-13 LAB — CBC WITH DIFFERENTIAL/PLATELET
Basophils Absolute: 0 10*3/uL (ref 0.0–0.1)
EOS%: 3.2 % (ref 0.0–7.0)
HCT: 24.9 % — ABNORMAL LOW (ref 38.4–49.9)
HGB: 8.5 g/dL — ABNORMAL LOW (ref 13.0–17.1)
LYMPH%: 24.9 % (ref 14.0–49.0)
MCH: 37.4 pg — ABNORMAL HIGH (ref 27.2–33.4)
MCV: 109.8 fL — ABNORMAL HIGH (ref 79.3–98.0)
MONO%: 9.4 % (ref 0.0–14.0)
NEUT%: 61.7 % (ref 39.0–75.0)
Platelets: 105 10*3/uL — ABNORMAL LOW (ref 140–400)

## 2013-03-13 MED ORDER — EPOETIN ALFA 40000 UNIT/ML IJ SOLN
40000.0000 [IU] | Freq: Once | INTRAMUSCULAR | Status: AC
  Administered 2013-03-13: 40000 [IU] via SUBCUTANEOUS
  Filled 2013-03-13: qty 1

## 2013-04-03 ENCOUNTER — Ambulatory Visit (HOSPITAL_BASED_OUTPATIENT_CLINIC_OR_DEPARTMENT_OTHER): Payer: Medicare Other

## 2013-04-03 ENCOUNTER — Other Ambulatory Visit (HOSPITAL_BASED_OUTPATIENT_CLINIC_OR_DEPARTMENT_OTHER): Payer: Medicare Other | Admitting: Lab

## 2013-04-03 VITALS — BP 115/51 | HR 89 | Temp 97.7°F | Resp 20

## 2013-04-03 DIAGNOSIS — N189 Chronic kidney disease, unspecified: Secondary | ICD-10-CM

## 2013-04-03 DIAGNOSIS — D631 Anemia in chronic kidney disease: Secondary | ICD-10-CM

## 2013-04-03 DIAGNOSIS — D638 Anemia in other chronic diseases classified elsewhere: Secondary | ICD-10-CM

## 2013-04-03 LAB — CBC WITH DIFFERENTIAL/PLATELET
Basophils Absolute: 0.1 10*3/uL (ref 0.0–0.1)
EOS%: 4.4 % (ref 0.0–7.0)
HGB: 9 g/dL — ABNORMAL LOW (ref 13.0–17.1)
MCH: 38 pg — ABNORMAL HIGH (ref 27.2–33.4)
MCHC: 34.2 g/dL (ref 32.0–36.0)
MCV: 111 fL — ABNORMAL HIGH (ref 79.3–98.0)
MONO%: 8.2 % (ref 0.0–14.0)
RDW: 23.7 % — ABNORMAL HIGH (ref 11.0–14.6)

## 2013-04-03 MED ORDER — EPOETIN ALFA 40000 UNIT/ML IJ SOLN
40000.0000 [IU] | Freq: Once | INTRAMUSCULAR | Status: AC
  Administered 2013-04-03: 40000 [IU] via SUBCUTANEOUS
  Filled 2013-04-03: qty 1

## 2013-04-23 ENCOUNTER — Other Ambulatory Visit: Payer: Self-pay | Admitting: Physician Assistant

## 2013-04-23 DIAGNOSIS — D638 Anemia in other chronic diseases classified elsewhere: Secondary | ICD-10-CM

## 2013-04-24 ENCOUNTER — Ambulatory Visit (HOSPITAL_BASED_OUTPATIENT_CLINIC_OR_DEPARTMENT_OTHER): Payer: Medicare Other

## 2013-04-24 ENCOUNTER — Other Ambulatory Visit: Payer: Medicare Other | Admitting: Lab

## 2013-04-24 VITALS — BP 130/64 | HR 57 | Temp 98.3°F

## 2013-04-24 DIAGNOSIS — D638 Anemia in other chronic diseases classified elsewhere: Secondary | ICD-10-CM

## 2013-04-24 DIAGNOSIS — N189 Chronic kidney disease, unspecified: Secondary | ICD-10-CM

## 2013-04-24 DIAGNOSIS — D631 Anemia in chronic kidney disease: Secondary | ICD-10-CM

## 2013-04-24 MED ORDER — EPOETIN ALFA 40000 UNIT/ML IJ SOLN
40000.0000 [IU] | Freq: Once | INTRAMUSCULAR | Status: AC
  Administered 2013-04-24: 40000 [IU] via SUBCUTANEOUS
  Filled 2013-04-24: qty 1

## 2013-04-28 ENCOUNTER — Ambulatory Visit: Payer: Medicare Other

## 2013-04-28 ENCOUNTER — Ambulatory Visit (INDEPENDENT_AMBULATORY_CARE_PROVIDER_SITE_OTHER): Payer: Medicare Other | Admitting: Emergency Medicine

## 2013-04-28 VITALS — BP 130/80 | HR 100 | Temp 97.8°F | Resp 16

## 2013-04-28 DIAGNOSIS — Z23 Encounter for immunization: Secondary | ICD-10-CM

## 2013-04-28 DIAGNOSIS — S59912A Unspecified injury of left forearm, initial encounter: Secondary | ICD-10-CM

## 2013-04-28 DIAGNOSIS — S40029A Contusion of unspecified upper arm, initial encounter: Secondary | ICD-10-CM

## 2013-04-28 DIAGNOSIS — D649 Anemia, unspecified: Secondary | ICD-10-CM

## 2013-04-28 DIAGNOSIS — S59909A Unspecified injury of unspecified elbow, initial encounter: Secondary | ICD-10-CM

## 2013-04-28 DIAGNOSIS — S40022A Contusion of left upper arm, initial encounter: Secondary | ICD-10-CM

## 2013-04-28 NOTE — Patient Instructions (Addendum)
Please return to clinic on Thursday to recheck the wound here on. Try and keep your arm elevated as best you can  WOUND CARE Please return in 2 days to have your stitches/staples removed or sooner if you have concerns. Marland Kitchen Keep area clean and dry for 24 hours. Do not remove bandage, if applied. . After 24 hours, remove bandage and wash wound gently with mild soap and warm water. Reapply a new bandage after cleaning wound, if directed. . Continue daily cleansing with soap and water until stitches/staples are removed. . Do not apply any ointments or creams to the wound while stitches/staples are in place, as this may cause delayed healing. . Notify the office if you experience any of the following signs of infection: Swelling, redness, pus drainage, streaking, fever >101.0 F . Notify the office if you experience excessive bleeding that does not stop after 15-20 minutes of constant, firm pressure.

## 2013-04-28 NOTE — Progress Notes (Signed)
  Subjective:    Patient ID: Anthony Hutchinson, male    DOB: 1922-02-06, 79 y.o.   MRN: [id number]  HPI patient was in his usual state of health until approximately one hour ago when while working at home but deleted on a container fell and lacerated his left arm. Skin. He is having no difficulty with his hand no weakness in his hand he has good motion in his wrist. He has a history of anemia currently on regular injections. He has been in good health otherwise. He does not know when his last tetanus shot was.    Review of Systems     Objective:   Physical Exam there are superficial lacerations over the distal portion of the extensor surface of the left forearm. The skin has been rolled back. Approximately 50% of the skin was reapproximated and placed over the open tissue. Steri-Strips were applied to hold this in place. The areas we did not have coverage for recovered with Xeroform followed by Telfa dressing Kling wrap and Ace wrap. Distal to the wound is a 3 x 3" hematoma.  [facility] reading (PRIMARY) by  Dr. Daiva Eves fracture seen          Assessment & Plan:   meds the wound was stressed skin was reapproximated patient tolerated well he was given a tetanus shot

## 2013-05-01 ENCOUNTER — Ambulatory Visit (INDEPENDENT_AMBULATORY_CARE_PROVIDER_SITE_OTHER): Payer: Medicare Other | Admitting: Emergency Medicine

## 2013-05-01 VITALS — BP 130/60 | HR 71 | Temp 98.2°F | Resp 16 | Ht 67.0 in | Wt 160.2 lb

## 2013-05-01 DIAGNOSIS — S51802S Unspecified open wound of left forearm, sequela: Secondary | ICD-10-CM

## 2013-05-01 DIAGNOSIS — IMO0002 Reserved for concepts with insufficient information to code with codable children: Secondary | ICD-10-CM

## 2013-05-01 NOTE — Progress Notes (Signed)
This chart was scribed for Anthony Chris, MD by Anthony Hutchinson, ED Scribe. This patient was seen in room Room 7 and the patient's care was started at 10:00 AM.  Subjective:    Patient ID: Anthony Hutchinson, male    DOB: 1922/04/16, 79 y.o.   MRN: [id number]  HPI  HPI Comments: DELAN KSIAZEK is a 79 y.o. male who presents to the [facility] complaining of an injury sustained to his left lower arm earlier today. Pt states that the lid of a container fell on his arm, causing the injury. There is a hematoma to the area. Pt has wrapped his arm in gauze prior to arrival.  Past Medical History  Diagnosis Date  . Hypertension   . Thyroid disease   . LBBB (left bundle branch block)   . Diabetes mellitus without complication   . H/O colonoscopy   . Cancer   . Hypothyroidism     nodule  . Cataracts, bilateral   . Anemia     iron deficiency anemia  . Inguinal hernia     Review of Systems  Skin: Positive for wound (left lower arm).      Objective:   Physical Exam  The hematoma has essentially resolved. He has a 2 x 3 cm area proximal forearm where he has lost skin covering. This looks healthy without evidence of infection. If the Steri-Strips are in place. Dressing was reapplied. He will recheck on Monday. He is to keep the dressing on .     Assessment & Plan:  Dressing was reapplied. Will recheck in 4 days  I personally performed the services described in this documentation, which was scribed in my presence. The recorded information has been reviewed and is accurate.

## 2013-05-05 ENCOUNTER — Ambulatory Visit (INDEPENDENT_AMBULATORY_CARE_PROVIDER_SITE_OTHER): Payer: Medicare Other | Admitting: Physician Assistant

## 2013-05-05 VITALS — BP 90/62 | HR 70 | Temp 98.1°F | Resp 18 | Ht 67.0 in | Wt 160.0 lb

## 2013-05-05 DIAGNOSIS — IMO0002 Reserved for concepts with insufficient information to code with codable children: Secondary | ICD-10-CM

## 2013-05-05 DIAGNOSIS — S51802S Unspecified open wound of left forearm, sequela: Secondary | ICD-10-CM

## 2013-05-05 NOTE — Progress Notes (Signed)
  Subjective:    Patient ID: Anthony Hutchinson, male    DOB: Jun 27, 1922, 79 y.o.   MRN: [id number]  Wound Check   79 year old male present for recheck of wound on left forearm. Had a significant skin tear that was repaired with steri-strips on 04/28/13.  Also had xeroform gauze applied to several areas that were open.  He states it is doing well. Pain has improved and bleeding has stopped.  No other concerns today.     Review of Systems  Constitutional: Negative for fever and chills.  Gastrointestinal: Negative for nausea and vomiting.  Musculoskeletal: Positive for joint swelling.  Skin: Positive for wound.       Objective:   Physical Exam  Constitutional: He is oriented to person, place, and time. He appears well-developed and well-nourished.  HENT:  Head: Normocephalic and atraumatic.  Right Ear: External ear normal.  Left Ear: External ear normal.  Eyes: Conjunctivae are normal.  Neurological: He is alert and oriented to person, place, and time.  Skin:     Psychiatric: He has a normal mood and affect. His behavior is normal. Judgment and thought content normal.     Non-stick telfa applied to wound. Wrapped in gauze and Ace bandage.      Assessment & Plan:  Wound, open, arm, forearm, left, sequela - Plan: Wound culture  Culture collected due to drainage noted on exam Will hold on antibiotics. There is no surrounding cellulitis and the patient has no pain of the wound Recheck in 48 hours.

## 2013-05-07 ENCOUNTER — Ambulatory Visit (INDEPENDENT_AMBULATORY_CARE_PROVIDER_SITE_OTHER): Payer: Medicare Other | Admitting: Emergency Medicine

## 2013-05-07 VITALS — BP 154/70 | HR 88 | Temp 98.3°F | Resp 18 | Wt 161.0 lb

## 2013-05-07 DIAGNOSIS — Z5189 Encounter for other specified aftercare: Secondary | ICD-10-CM

## 2013-05-07 NOTE — Progress Notes (Signed)
Subjective:    Patient ID: Anthony Hutchinson, male    DOB: 26-Sep-1921, 79 y.o.   MRN: [id number]  HPI This chart was scribed for Viviann Spare Baily Hovanec-MD, by Ladona Ridgel Day, Scribe. This patient was seen in room 7 and the patient's care was started at 9:44 AM.  HPI Comments: Anthony Hutchinson is a 79 y.o. male who presents to the [facility] complaining for wound care of his left forearm from a previous skin tear that was repaired on 04/28/13. He was also seen here 2 days ago for a recheck of this wound in which he had no concerns of his wound and was healing well at that time.  Today he reports he is doing well. He has been cleaning the area with a mild soap he has had no fevers chills or other new symptoms develop   Past Medical History  Diagnosis Date  . Hypertension   . Thyroid disease   . LBBB (left bundle branch block)   . Diabetes mellitus without complication   . H/O colonoscopy   . Cancer   . Hypothyroidism     nodule  . Cataracts, bilateral   . Anemia     iron deficiency anemia  . Inguinal hernia     Past Surgical History  Procedure Laterality Date  . Tonsillectomy    . Hiatal hernia repair    . Hernia repair  1979    bilateral inguinal hernia  . Thyroid lobectomy  6/85    right  . Acromionectomy  1998    rotator cuff repair  . Prostate surgery    . Abdominal aortic aneurysm repair    . Tonsillectomy    . Flexible sigmoidoscopy N/A 12/13/2012    Procedure: FLEXIBLE SIGMOIDOSCOPY;  Surgeon: Theda Belfast, MD;  Location: [facility] ENDOSCOPY;  Service: Endoscopy;  Laterality: N/A;  . Hot hemostasis N/A 12/13/2012    Procedure: HOT HEMOSTASIS (ARGON PLASMA COAGULATION/BICAP);  Surgeon: Theda Belfast, MD;  Location: [facility];  Service: Endoscopy;  Laterality: N/A;    Family History  Problem Relation Age of Onset  . Heart disease Mother   . Heart disease Father     History   Social History  . Marital Status: Married    Spouse Name: N/A    Number of Children: N/A   . Years of Education: N/A   Occupational History  . Not on file.   Social History Main Topics  . Smoking status: Former Smoker    Types: Pipe    Quit date: 11/27/1963  . Smokeless tobacco: Never Used  . Alcohol Use: No  . Drug Use: No  . Sexual Activity: Not on file   Other Topics Concern  . Not on file   Social History Narrative  . No narrative on file    Allergies  Allergen Reactions  . Penicillins   . Codeine Sulfate     Patient Active Problem List   Diagnosis Date Noted  . Cholelithiasis 05/14/2012  . Anemia associated with chronic renal failure 05/25/2011  . Anemia in chronic illness 05/25/2011    Results for orders placed in visit on 05/05/13  WOUND CULTURE      Result Value Range   Gram Stain Rare     Gram Stain WBC present-predominately PMN     Gram Stain No Squamous Epithelial Cells Seen     Gram Stain Few Gram Positive Cocci In Pairs     Preliminary Report Moderate Gram Negative Rods  No diagnosis found.  No orders of the defined types were placed in this encounter.      Review of Systems     Objective:   Physical Exam the distal wounds have completely healed. The midforearm wound 3 Steri-Strips were removed. There is no purulence noted Xeroform was left on the wound. There is no odor to the wound.      Assessment & Plan:  Patient has a healing wound of the forearm. No change in treatment. He is to clean the area with soap and water 2 times a day he was told he could keep the wound open at home.

## 2013-05-08 LAB — WOUND CULTURE: Gram Stain: NONE SEEN

## 2013-05-09 ENCOUNTER — Telehealth: Payer: Self-pay | Admitting: Radiology

## 2013-05-09 NOTE — Telephone Encounter (Signed)
Called pt to advise culture did grow a bacteria. He needs to come back in on Saturday, not wait until Wednesday, he agrees and will come in on Saturday before noon.

## 2013-05-10 ENCOUNTER — Ambulatory Visit (INDEPENDENT_AMBULATORY_CARE_PROVIDER_SITE_OTHER): Payer: Medicare Other | Admitting: Emergency Medicine

## 2013-05-10 VITALS — BP 144/68 | HR 83 | Temp 97.8°F | Resp 16 | Ht 67.0 in | Wt 160.0 lb

## 2013-05-10 DIAGNOSIS — Z5189 Encounter for other specified aftercare: Secondary | ICD-10-CM

## 2013-05-10 DIAGNOSIS — S51802D Unspecified open wound of left forearm, subsequent encounter: Secondary | ICD-10-CM

## 2013-05-10 NOTE — Progress Notes (Signed)
  Subjective:    Patient ID: Anthony Hutchinson, male    DOB: 08/29/1921, 79 y.o.   MRN: [id number]  HPI 79 year old male presents for recheck of wound on left forearm.  Had a culture 05/05/13 that grew klebsiella - has not been on antibiotics.  States he has not been washing his wound but has been keeping it covered.  He has not noticed any drainage. No pain, warmth, or erythema.       Review of Systems  Constitutional: Negative for fever and chills.  Gastrointestinal: Negative for nausea and vomiting.  Skin: Positive for wound.       Objective:   Physical Exam  Constitutional: He is oriented to person, place, and time. He appears well-developed and well-nourished.  HENT:  Head: Normocephalic and atraumatic.  Right Ear: External ear normal.  Left Ear: External ear normal.  Eyes: Conjunctivae are normal.  Neck: Normal range of motion.  Neurological: He is alert and oriented to person, place, and time.  Skin:             Assessment & Plan:  Wound, open, arm, forearm, left, subsequent encounter  Will hold on antibiotics since wound seems to be healing well with no purulent drainage or cellulitis noted today.  Wash daily with soap and water Recheck in 1 week

## 2013-05-15 ENCOUNTER — Other Ambulatory Visit (HOSPITAL_BASED_OUTPATIENT_CLINIC_OR_DEPARTMENT_OTHER): Payer: Medicare Other | Admitting: Lab

## 2013-05-15 ENCOUNTER — Ambulatory Visit (HOSPITAL_BASED_OUTPATIENT_CLINIC_OR_DEPARTMENT_OTHER): Payer: Medicare Other

## 2013-05-15 ENCOUNTER — Other Ambulatory Visit: Payer: Self-pay

## 2013-05-15 VITALS — BP 145/57 | HR 76 | Temp 98.0°F | Resp 16

## 2013-05-15 DIAGNOSIS — N189 Chronic kidney disease, unspecified: Secondary | ICD-10-CM

## 2013-05-15 DIAGNOSIS — D638 Anemia in other chronic diseases classified elsewhere: Secondary | ICD-10-CM

## 2013-05-15 DIAGNOSIS — D631 Anemia in chronic kidney disease: Secondary | ICD-10-CM

## 2013-05-15 LAB — CBC WITH DIFFERENTIAL/PLATELET
BASO%: 1.3 % (ref 0.0–2.0)
Basophils Absolute: 0.1 10*3/uL (ref 0.0–0.1)
EOS%: 2.5 % (ref 0.0–7.0)
HCT: 24.9 % — ABNORMAL LOW (ref 38.4–49.9)
HGB: 8.3 g/dL — ABNORMAL LOW (ref 13.0–17.1)
MCH: 37.7 pg — ABNORMAL HIGH (ref 27.2–33.4)
MONO#: 0.4 10*3/uL (ref 0.1–0.9)
NEUT%: 64.4 % (ref 39.0–75.0)
WBC: 4.9 10*3/uL (ref 4.0–10.3)
lymph#: 1.2 10*3/uL (ref 0.9–3.3)

## 2013-05-15 MED ORDER — EPOETIN ALFA 40000 UNIT/ML IJ SOLN
40000.0000 [IU] | Freq: Once | INTRAMUSCULAR | Status: AC
  Administered 2013-05-15: 40000 [IU] via SUBCUTANEOUS
  Filled 2013-05-15: qty 1

## 2013-05-16 ENCOUNTER — Ambulatory Visit (INDEPENDENT_AMBULATORY_CARE_PROVIDER_SITE_OTHER): Payer: Medicare Other | Admitting: Physician Assistant

## 2013-05-16 VITALS — BP 132/60 | HR 64 | Temp 97.8°F | Resp 18 | Ht 66.25 in | Wt 160.8 lb

## 2013-05-16 DIAGNOSIS — Z5189 Encounter for other specified aftercare: Secondary | ICD-10-CM

## 2013-05-16 DIAGNOSIS — S51802D Unspecified open wound of left forearm, subsequent encounter: Secondary | ICD-10-CM

## 2013-05-16 NOTE — Progress Notes (Signed)
  Subjective:    Patient ID: Anthony Hutchinson, male    DOB: [birth year], 79 y.o.   MRN: [id number]  HPI 79 year old male presents for recheck of wound on left forearm.  Doing well without any issues or complaints.  He has been washing gently with soap and water.  Steri strips no longer in place. Xeroform still over proximal aspect of wound.  No drainage, warmth, or tenderness.      Review of Systems  Constitutional: Negative for fever and chills.  Gastrointestinal: Negative for nausea and vomiting.  Skin: Positive for wound.       Objective:   Physical Exam  Constitutional: He is oriented to person, place, and time. He appears well-developed and well-nourished.  HENT:  Head: Normocephalic and atraumatic.  Right Ear: External ear normal.  Neurological: He is oriented to person, place, and time.  Skin:     Psychiatric: He has a normal mood and affect. His behavior is normal. Judgment and thought content normal.    Xeroform removed. Wound well healing underneath. No drainage noted.  Wound was bandaged and wrapped in an Ace wrap     Assessment & Plan:   Wound, open, forearm, left, subsequent encounter  Keep covered for another 1-2 days.  Continue soapy scrubs.  Follow up as needed.

## 2013-06-06 ENCOUNTER — Ambulatory Visit (HOSPITAL_BASED_OUTPATIENT_CLINIC_OR_DEPARTMENT_OTHER): Payer: Medicare Other

## 2013-06-06 ENCOUNTER — Other Ambulatory Visit (HOSPITAL_BASED_OUTPATIENT_CLINIC_OR_DEPARTMENT_OTHER): Payer: Medicare Other | Admitting: Lab

## 2013-06-06 VITALS — BP 142/53 | HR 66 | Temp 97.5°F

## 2013-06-06 DIAGNOSIS — D638 Anemia in other chronic diseases classified elsewhere: Secondary | ICD-10-CM

## 2013-06-06 DIAGNOSIS — D631 Anemia in chronic kidney disease: Secondary | ICD-10-CM

## 2013-06-06 DIAGNOSIS — N189 Chronic kidney disease, unspecified: Secondary | ICD-10-CM

## 2013-06-06 LAB — CBC WITH DIFFERENTIAL/PLATELET
BASO%: 0.6 % (ref 0.0–2.0)
Eosinophils Absolute: 0.2 10*3/uL (ref 0.0–0.5)
LYMPH%: 27.1 % (ref 14.0–49.0)
MCHC: 33.6 g/dL (ref 32.0–36.0)
MONO#: 0.4 10*3/uL (ref 0.1–0.9)
NEUT#: 3.1 10*3/uL (ref 1.5–6.5)
RBC: 2.29 10*6/uL — ABNORMAL LOW (ref 4.20–5.82)
RDW: 22.1 % — ABNORMAL HIGH (ref 11.0–14.6)
WBC: 5 10*3/uL (ref 4.0–10.3)
lymph#: 1.4 10*3/uL (ref 0.9–3.3)

## 2013-06-06 MED ORDER — EPOETIN ALFA 40000 UNIT/ML IJ SOLN
40000.0000 [IU] | Freq: Once | INTRAMUSCULAR | Status: AC
  Administered 2013-06-06: 40000 [IU] via SUBCUTANEOUS
  Filled 2013-06-06: qty 1

## 2013-06-26 ENCOUNTER — Ambulatory Visit (HOSPITAL_BASED_OUTPATIENT_CLINIC_OR_DEPARTMENT_OTHER): Payer: Medicare Other

## 2013-06-26 ENCOUNTER — Other Ambulatory Visit (HOSPITAL_BASED_OUTPATIENT_CLINIC_OR_DEPARTMENT_OTHER): Payer: Medicare Other

## 2013-06-26 VITALS — BP 127/66 | HR 68 | Temp 97.8°F

## 2013-06-26 DIAGNOSIS — D638 Anemia in other chronic diseases classified elsewhere: Secondary | ICD-10-CM

## 2013-06-26 DIAGNOSIS — D631 Anemia in chronic kidney disease: Secondary | ICD-10-CM

## 2013-06-26 DIAGNOSIS — N189 Chronic kidney disease, unspecified: Secondary | ICD-10-CM

## 2013-06-26 LAB — CBC WITH DIFFERENTIAL/PLATELET
BASO%: 0.9 % (ref 0.0–2.0)
Eosinophils Absolute: 0.2 10*3/uL (ref 0.0–0.5)
HCT: 27.1 % — ABNORMAL LOW (ref 38.4–49.9)
HGB: 9.3 g/dL — ABNORMAL LOW (ref 13.0–17.1)
MCHC: 34.4 g/dL (ref 32.0–36.0)
MONO#: 0.4 10*3/uL (ref 0.1–0.9)
NEUT#: 2.7 10*3/uL (ref 1.5–6.5)
NEUT%: 60.2 % (ref 39.0–75.0)
Platelets: 77 10*3/uL — ABNORMAL LOW (ref 140–400)
WBC: 4.5 10*3/uL (ref 4.0–10.3)
lymph#: 1.2 10*3/uL (ref 0.9–3.3)

## 2013-06-26 MED ORDER — EPOETIN ALFA 40000 UNIT/ML IJ SOLN
40000.0000 [IU] | Freq: Once | INTRAMUSCULAR | Status: AC
  Administered 2013-06-26: 40000 [IU] via SUBCUTANEOUS
  Filled 2013-06-26: qty 1

## 2013-07-17 ENCOUNTER — Ambulatory Visit (HOSPITAL_BASED_OUTPATIENT_CLINIC_OR_DEPARTMENT_OTHER): Payer: Medicare Other

## 2013-07-17 ENCOUNTER — Other Ambulatory Visit (HOSPITAL_BASED_OUTPATIENT_CLINIC_OR_DEPARTMENT_OTHER): Payer: Medicare Other

## 2013-07-17 VITALS — BP 136/63 | HR 78 | Temp 97.9°F

## 2013-07-17 DIAGNOSIS — D631 Anemia in chronic kidney disease: Secondary | ICD-10-CM

## 2013-07-17 DIAGNOSIS — N189 Chronic kidney disease, unspecified: Secondary | ICD-10-CM

## 2013-07-17 DIAGNOSIS — N039 Chronic nephritic syndrome with unspecified morphologic changes: Secondary | ICD-10-CM

## 2013-07-17 DIAGNOSIS — D638 Anemia in other chronic diseases classified elsewhere: Secondary | ICD-10-CM

## 2013-07-17 LAB — CBC WITH DIFFERENTIAL/PLATELET
BASO%: 1.8 % (ref 0.0–2.0)
Basophils Absolute: 0.1 10*3/uL (ref 0.0–0.1)
EOS%: 2.8 % (ref 0.0–7.0)
Eosinophils Absolute: 0.2 10*3/uL (ref 0.0–0.5)
HCT: 27.7 % — ABNORMAL LOW (ref 38.4–49.9)
HGB: 9.2 g/dL — ABNORMAL LOW (ref 13.0–17.1)
LYMPH%: 25.6 % (ref 14.0–49.0)
MCH: 37.4 pg — AB (ref 27.2–33.4)
MCHC: 33.2 g/dL (ref 32.0–36.0)
MCV: 112.7 fL — ABNORMAL HIGH (ref 79.3–98.0)
MONO#: 0.5 10*3/uL (ref 0.1–0.9)
MONO%: 8.3 % (ref 0.0–14.0)
NEUT#: 3.4 10*3/uL (ref 1.5–6.5)
NEUT%: 61.5 % (ref 39.0–75.0)
PLATELETS: 92 10*3/uL — AB (ref 140–400)
RBC: 2.46 10*6/uL — AB (ref 4.20–5.82)
RDW: 23.2 % — ABNORMAL HIGH (ref 11.0–14.6)
WBC: 5.6 10*3/uL (ref 4.0–10.3)
lymph#: 1.4 10*3/uL (ref 0.9–3.3)

## 2013-07-17 MED ORDER — EPOETIN ALFA 40000 UNIT/ML IJ SOLN
40000.0000 [IU] | Freq: Once | INTRAMUSCULAR | Status: AC
  Administered 2013-07-17: 40000 [IU] via SUBCUTANEOUS
  Filled 2013-07-17: qty 1

## 2013-08-07 ENCOUNTER — Other Ambulatory Visit: Payer: Self-pay | Admitting: Physician Assistant

## 2013-08-07 ENCOUNTER — Ambulatory Visit (HOSPITAL_BASED_OUTPATIENT_CLINIC_OR_DEPARTMENT_OTHER): Payer: Medicare Other

## 2013-08-07 ENCOUNTER — Other Ambulatory Visit (HOSPITAL_BASED_OUTPATIENT_CLINIC_OR_DEPARTMENT_OTHER): Payer: Medicare Other

## 2013-08-07 VITALS — BP 115/76 | HR 57 | Temp 97.8°F

## 2013-08-07 DIAGNOSIS — N039 Chronic nephritic syndrome with unspecified morphologic changes: Secondary | ICD-10-CM

## 2013-08-07 DIAGNOSIS — N189 Chronic kidney disease, unspecified: Secondary | ICD-10-CM

## 2013-08-07 DIAGNOSIS — D638 Anemia in other chronic diseases classified elsewhere: Secondary | ICD-10-CM

## 2013-08-07 DIAGNOSIS — D631 Anemia in chronic kidney disease: Secondary | ICD-10-CM

## 2013-08-07 LAB — CBC WITH DIFFERENTIAL/PLATELET
BASO%: 1 % (ref 0.0–2.0)
Basophils Absolute: 0.1 10*3/uL (ref 0.0–0.1)
EOS%: 2.8 % (ref 0.0–7.0)
Eosinophils Absolute: 0.2 10*3/uL (ref 0.0–0.5)
HEMATOCRIT: 25.8 % — AB (ref 38.4–49.9)
HGB: 8.7 g/dL — ABNORMAL LOW (ref 13.0–17.1)
LYMPH#: 1.3 10*3/uL (ref 0.9–3.3)
LYMPH%: 21.7 % (ref 14.0–49.0)
MCH: 37.7 pg — AB (ref 27.2–33.4)
MCHC: 33.7 g/dL (ref 32.0–36.0)
MCV: 111.8 fL — ABNORMAL HIGH (ref 79.3–98.0)
MONO#: 0.6 10*3/uL (ref 0.1–0.9)
MONO%: 9.2 % (ref 0.0–14.0)
NEUT#: 3.9 10*3/uL (ref 1.5–6.5)
NEUT%: 65.3 % (ref 39.0–75.0)
Platelets: 104 10*3/uL — ABNORMAL LOW (ref 140–400)
RBC: 2.31 10*6/uL — AB (ref 4.20–5.82)
RDW: 23.8 % — AB (ref 11.0–14.6)
WBC: 6 10*3/uL (ref 4.0–10.3)

## 2013-08-07 MED ORDER — EPOETIN ALFA 40000 UNIT/ML IJ SOLN
40000.0000 [IU] | Freq: Once | INTRAMUSCULAR | Status: AC
  Administered 2013-08-07: 40000 [IU] via SUBCUTANEOUS
  Filled 2013-08-07: qty 1

## 2013-08-28 ENCOUNTER — Ambulatory Visit: Payer: Medicare Other

## 2013-08-28 ENCOUNTER — Other Ambulatory Visit: Payer: Medicare Other

## 2013-09-01 ENCOUNTER — Other Ambulatory Visit (HOSPITAL_BASED_OUTPATIENT_CLINIC_OR_DEPARTMENT_OTHER): Payer: Medicare Other

## 2013-09-01 ENCOUNTER — Ambulatory Visit (HOSPITAL_BASED_OUTPATIENT_CLINIC_OR_DEPARTMENT_OTHER): Payer: Medicare Other

## 2013-09-01 VITALS — BP 123/52 | HR 77 | Temp 97.9°F

## 2013-09-01 DIAGNOSIS — N189 Chronic kidney disease, unspecified: Secondary | ICD-10-CM

## 2013-09-01 DIAGNOSIS — D631 Anemia in chronic kidney disease: Secondary | ICD-10-CM

## 2013-09-01 DIAGNOSIS — N039 Chronic nephritic syndrome with unspecified morphologic changes: Secondary | ICD-10-CM

## 2013-09-01 DIAGNOSIS — D638 Anemia in other chronic diseases classified elsewhere: Secondary | ICD-10-CM

## 2013-09-01 LAB — CBC WITH DIFFERENTIAL/PLATELET
BASO%: 1.3 % (ref 0.0–2.0)
BASOS ABS: 0.1 10*3/uL (ref 0.0–0.1)
EOS%: 2.1 % (ref 0.0–7.0)
Eosinophils Absolute: 0.1 10*3/uL (ref 0.0–0.5)
HEMATOCRIT: 24.6 % — AB (ref 38.4–49.9)
HEMOGLOBIN: 8.3 g/dL — AB (ref 13.0–17.1)
LYMPH#: 1.5 10*3/uL (ref 0.9–3.3)
LYMPH%: 29.1 % (ref 14.0–49.0)
MCH: 37.7 pg — ABNORMAL HIGH (ref 27.2–33.4)
MCHC: 33.7 g/dL (ref 32.0–36.0)
MCV: 112 fL — ABNORMAL HIGH (ref 79.3–98.0)
MONO#: 0.4 10*3/uL (ref 0.1–0.9)
MONO%: 7.8 % (ref 0.0–14.0)
NEUT%: 59.7 % (ref 39.0–75.0)
NEUTROS ABS: 3 10*3/uL (ref 1.5–6.5)
Platelets: 74 10*3/uL — ABNORMAL LOW (ref 140–400)
RBC: 2.2 10*6/uL — ABNORMAL LOW (ref 4.20–5.82)
RDW: 23.7 % — AB (ref 11.0–14.6)
WBC: 5 10*3/uL (ref 4.0–10.3)

## 2013-09-01 MED ORDER — EPOETIN ALFA 40000 UNIT/ML IJ SOLN
40000.0000 [IU] | Freq: Once | INTRAMUSCULAR | Status: AC
  Administered 2013-09-01: 40000 [IU] via SUBCUTANEOUS
  Filled 2013-09-01: qty 1

## 2013-09-18 ENCOUNTER — Ambulatory Visit (HOSPITAL_BASED_OUTPATIENT_CLINIC_OR_DEPARTMENT_OTHER): Payer: Medicare Other | Admitting: Oncology

## 2013-09-18 ENCOUNTER — Other Ambulatory Visit: Payer: Medicare Other

## 2013-09-18 ENCOUNTER — Other Ambulatory Visit (HOSPITAL_BASED_OUTPATIENT_CLINIC_OR_DEPARTMENT_OTHER): Payer: Medicare Other

## 2013-09-18 ENCOUNTER — Ambulatory Visit: Payer: Medicare Other

## 2013-09-18 ENCOUNTER — Telehealth: Payer: Self-pay | Admitting: *Deleted

## 2013-09-18 ENCOUNTER — Ambulatory Visit (HOSPITAL_BASED_OUTPATIENT_CLINIC_OR_DEPARTMENT_OTHER): Payer: Medicare Other

## 2013-09-18 ENCOUNTER — Other Ambulatory Visit: Payer: Self-pay | Admitting: Oncology

## 2013-09-18 VITALS — BP 145/68 | HR 80 | Temp 97.7°F | Resp 20 | Ht 66.25 in | Wt 164.3 lb

## 2013-09-18 DIAGNOSIS — D638 Anemia in other chronic diseases classified elsewhere: Secondary | ICD-10-CM

## 2013-09-18 DIAGNOSIS — D4620 Refractory anemia with excess of blasts, unspecified: Secondary | ICD-10-CM

## 2013-09-18 DIAGNOSIS — N039 Chronic nephritic syndrome with unspecified morphologic changes: Principal | ICD-10-CM

## 2013-09-18 DIAGNOSIS — D631 Anemia in chronic kidney disease: Secondary | ICD-10-CM

## 2013-09-18 DIAGNOSIS — N189 Chronic kidney disease, unspecified: Secondary | ICD-10-CM

## 2013-09-18 LAB — CBC WITH DIFFERENTIAL/PLATELET
BASO%: 1.1 % (ref 0.0–2.0)
BASOS ABS: 0.1 10*3/uL (ref 0.0–0.1)
EOS ABS: 0.2 10*3/uL (ref 0.0–0.5)
EOS%: 3.3 % (ref 0.0–7.0)
HEMATOCRIT: 26 % — AB (ref 38.4–49.9)
HEMOGLOBIN: 8.6 g/dL — AB (ref 13.0–17.1)
LYMPH#: 1.3 10*3/uL (ref 0.9–3.3)
LYMPH%: 26.8 % (ref 14.0–49.0)
MCH: 37.1 pg — AB (ref 27.2–33.4)
MCHC: 32.9 g/dL (ref 32.0–36.0)
MCV: 112.8 fL — AB (ref 79.3–98.0)
MONO#: 0.4 10*3/uL (ref 0.1–0.9)
MONO%: 8.8 % (ref 0.0–14.0)
NEUT%: 60 % (ref 39.0–75.0)
NEUTROS ABS: 2.9 10*3/uL (ref 1.5–6.5)
Platelets: 81 10*3/uL — ABNORMAL LOW (ref 140–400)
RBC: 2.31 10*6/uL — ABNORMAL LOW (ref 4.20–5.82)
RDW: 23.2 % — AB (ref 11.0–14.6)
WBC: 4.8 10*3/uL (ref 4.0–10.3)

## 2013-09-18 MED ORDER — EPOETIN ALFA 40000 UNIT/ML IJ SOLN
40000.0000 [IU] | Freq: Once | INTRAMUSCULAR | Status: AC
  Administered 2013-09-18: 40000 [IU] via SUBCUTANEOUS
  Filled 2013-09-18: qty 1

## 2013-09-18 NOTE — Telephone Encounter (Signed)
appts made and printed...td 

## 2013-09-18 NOTE — Progress Notes (Signed)
ID: BUDDY LOEFFELHOLZ   DOB: 11-23-1921  MR#: [id number]  YYQ#:[id number]  PCP: Merrilee Seashore, MD GYN:  SU:  OTHER MD:   HISTORY OF PRESENT ILLNESS: We have lab work dating back to 10-03-02 and at that time, Mr. Byron hemoglobin was 10.7 with an MCV of 107.1.  The platelet count was 213 and the white cell count was 8.7.  Creatinine was 1.5 with a BUN of 20.  In March 2005, the patient had three Hemoccults  that were negative.  In April 2005 his white cell count was normal again, his platelet count was normal again, and his hemoglobin was stable at 10.7 with an MCV of 106.6.  The creatinine was 1.2.  The next set of labs that I have is from 10-27-04.  This time the white cell count was elevated at 12.6.  The hemoglobin was again 10.7.  The MCV was 107 and the platelet count was normal at 265.  The creatinine was 1.4.  Liver function tests were normal.    In addition to these tests, Dr. Ashby Dawes completed an anemia work up with a folate which was normal at 20.5 and a vitamin B12 which was normal at 446.  He also referred the patient to Dr. Jim Desanctis and, according to Mr. Muehl, Dr. Lajoyce Corners performed colonoscopy and EGD (I do not have those reports), which were reportedly negative for a bleeding source.  A bone marrow biopsy performed 12/09/04 did not show any evidence of leukemia or prostate cancer.  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  He was determined to have a mixed anemia, partly due to the myelodysplasia, partly due to renal failure since he has a very low creatinine clearance.  He began receiving EPO in late June 2006. Additional treatment plan as detailed below.    INTERVAL HISTORY: Mr. Cueto returns today for routine followup of refractory anemia and anemia of renal insufficiency. It turned 80 in February. It was a quiet celebration. His wife is mostly in bed. She is quite demented. He is not sure what would happen if he had to be  admitted for any reason, but he does have a son in [place] who is thinking of moving back to [place]. His daughter might not be able to help since her son, administer, just moved back in with her with 4 grandchildren   REVIEW OF SYSTEMS: Mr. Dirr continues to problems with his feet as his major issue. He denies shortness of breath, cough, pleurisy, chest pain or pressure. There have been no falls, no dizziness, and no presyncopal symptoms. He has had some hemorrhoidal bleeding. Otherwise a detailed review of systems today was stable  PAST MEDICAL HISTORY: Past Medical History  Diagnosis Date  . Hypertension   . Thyroid disease   . LBBB (left bundle branch block)   . Diabetes mellitus without complication   . H/O colonoscopy   . Cancer   . Hypothyroidism     nodule  . Cataracts, bilateral   . Anemia     iron deficiency anemia  . Inguinal hernia     PAST SURGICAL HISTORY: Past Surgical History  Procedure Laterality Date  . Tonsillectomy    . Hiatal hernia repair    . Hernia repair  1979    bilateral inguinal hernia  . Thyroid lobectomy  6/85    right  . Acromionectomy  1998    rotator cuff repair  . Prostate surgery    . Abdominal aortic aneurysm  repair    . Tonsillectomy    . Flexible sigmoidoscopy N/A 12/13/2012    Procedure: FLEXIBLE SIGMOIDOSCOPY;  Surgeon: Beryle Beams, MD;  Location: [facility];  Service: Endoscopy;  Laterality: N/A;  . Hot hemostasis N/A 12/13/2012    Procedure: HOT HEMOSTASIS (ARGON PLASMA COAGULATION/BICAP);  Surgeon: Beryle Beams, MD;  Location: [facility];  Service: Endoscopy;  Laterality: N/A;    FAMILY HISTORY Family History  Problem Relation Age of Onset  . Heart disease Mother   . Heart disease Father   The patient's father died from complications of emphysema in his 47s.  His mother's history was essentially the same.  The patient has one sister who died from scleroderma complications.    SOCIAL HISTORY: He used to work at  a [employer].  Because of peripheral neuropathy symptoms, he has been worked up for heavy metal exposure but that has been normal, he said.  He has been married 66 years to his wife, Blanch Media, who now has severe dementia but still lives at home.  The patient's children are Hurshel Party who lives in [place] and used to teach dancing, and Jonpaul Lumm who lives in [place] and runs a Materials engineer.  The patient has two grandchildren and five great-grandchildren.  He is a Tourist information centre manager.  ADVANCED DIRECTIVES:  HEALTH MAINTENANCE: History  Substance Use Topics  . Smoking status: Former Smoker    Types: Pipe    Quit date: 11/27/1963  . Smokeless tobacco: Never Used  . Alcohol Use: No     Colonoscopy:  Lipid panel:  Allergies  Allergen Reactions  . Penicillins   . Codeine Sulfate     Current Outpatient Prescriptions  Medication Sig Dispense Refill  . alfuzosin (UROXATRAL) 10 MG 24 hr tablet Take 10 mg by mouth every evening.       . Cyanocobalamin (VITAMIN B 12 PO) Take by mouth daily.      . Fe Fum-FePoly-Vit C-Vit B3 (INTEGRA PO) Take by mouth.      . levothyroxine (SYNTHROID, LEVOTHROID) 112 MCG tablet Take 112 mcg by mouth every morning.      Marland Kitchen lisinopril (PRINIVIL,ZESTRIL) 10 MG tablet Take 10 mg by mouth every morning.      . magnesium 30 MG tablet Take 30 mg by mouth daily.      Marland Kitchen METOPROLOL SUCCINATE ER PO Take by mouth daily.      . Multiple Vitamin (MULTIVITAMIN WITH MINERALS) TABS Take 1 tablet by mouth every morning.      . pyridOXINE (VITAMIN B-6) 100 MG tablet Take 100 mg by mouth every morning.      Marland Kitchen UNKNOWN TO PATIENT Epogen Injection for anemia every 3 weeks       No current facility-administered medications for this visit.    OBJECTIVE: Elderly white male who appears stated age 80 Vitals:   09/18/13 0932  BP: 145/68  Pulse: 80  Temp: 97.7 F (36.5 C)  Resp: 20     Body mass index is 26.31 kg/(m^2).    ECOG FS: 2 Filed Weights   09/18/13 0932  Weight: 164 lb 4.8  oz (74.526 kg)    Sclerae unicteric, EOMs intact No cervical or supraclavicular adenopathy Lungs show bibasilar rales, no rhonchi or wheezes Heart regular rate and rhythm Abdomen soft, nontender, positive bowel sounds, no splenomegaly No peripheral edema Neuro: nonfocal, well oriented with positive affect   LAB RESULTS: Lab Results  Component Value Date   WBC 4.8 09/18/2013   NEUTROABS 2.9  09/18/2013   HGB 8.6* 09/18/2013   HCT 26.0* 09/18/2013   MCV 112.8* 09/18/2013   PLT 81* 09/18/2013      Chemistry      Component Value Date/Time   NA 137 11/26/2012 1724   K 5.1 11/26/2012 1724   CL 107 11/26/2012 1724   CO2 24 04/08/2009 0851   BUN 43* 11/26/2012 1724   CREATININE 1.40* 11/26/2012 1724      Component Value Date/Time   CALCIUM 9.4 04/08/2009 0851   ALKPHOS 53 04/08/2009 0851   AST 16 04/08/2009 0851   ALT 17 04/08/2009 0851   BILITOT 0.8 04/08/2009 0851      STUDIES: No results found.  ASSESSMENT: 80 y.o.   man with history of refractory anemia with ring sideroblasts, initially diagnosed June of 2006. Also with anemia of renal failure. He is stable on chronic EPO supplementation given every 3 weeks.  PLAN: His hemoglobin has drifted slightly down come up but not entirely consistently so. I am going to check a ferritin every 3 months and replace iron as needed. Otherwise we are continuing every three-week E. Po supplementation as before. He will see me again in one year. He knows to call for any problems that we may be of assistance with for that visit.  Irelyn Perfecto C    09/18/2013

## 2013-10-09 ENCOUNTER — Other Ambulatory Visit (HOSPITAL_BASED_OUTPATIENT_CLINIC_OR_DEPARTMENT_OTHER): Payer: Medicare Other

## 2013-10-09 ENCOUNTER — Ambulatory Visit (HOSPITAL_BASED_OUTPATIENT_CLINIC_OR_DEPARTMENT_OTHER): Payer: Medicare Other

## 2013-10-09 VITALS — BP 144/51 | HR 66 | Temp 97.2°F

## 2013-10-09 DIAGNOSIS — N039 Chronic nephritic syndrome with unspecified morphologic changes: Secondary | ICD-10-CM

## 2013-10-09 DIAGNOSIS — N189 Chronic kidney disease, unspecified: Secondary | ICD-10-CM

## 2013-10-09 DIAGNOSIS — D631 Anemia in chronic kidney disease: Secondary | ICD-10-CM

## 2013-10-09 DIAGNOSIS — D638 Anemia in other chronic diseases classified elsewhere: Secondary | ICD-10-CM

## 2013-10-09 LAB — CBC WITH DIFFERENTIAL/PLATELET
BASO%: 0.3 % (ref 0.0–2.0)
BASOS ABS: 0 10*3/uL (ref 0.0–0.1)
EOS ABS: 0.2 10*3/uL (ref 0.0–0.5)
EOS%: 2.8 % (ref 0.0–7.0)
HCT: 26 % — ABNORMAL LOW (ref 38.4–49.9)
HEMOGLOBIN: 8.8 g/dL — AB (ref 13.0–17.1)
LYMPH%: 27.9 % (ref 14.0–49.0)
MCH: 35.2 pg — AB (ref 27.2–33.4)
MCHC: 33.8 g/dL (ref 32.0–36.0)
MCV: 104 fL — AB (ref 79.3–98.0)
MONO#: 0.5 10*3/uL (ref 0.1–0.9)
MONO%: 8.1 % (ref 0.0–14.0)
NEUT%: 60.9 % (ref 39.0–75.0)
NEUTROS ABS: 3.7 10*3/uL (ref 1.5–6.5)
PLATELETS: 85 10*3/uL — AB (ref 140–400)
RBC: 2.5 10*6/uL — ABNORMAL LOW (ref 4.20–5.82)
RDW: 20.8 % — ABNORMAL HIGH (ref 11.0–14.6)
WBC: 6.1 10*3/uL (ref 4.0–10.3)
lymph#: 1.7 10*3/uL (ref 0.9–3.3)
nRBC: 0 % (ref 0–0)

## 2013-10-09 LAB — FERRITIN CHCC: Ferritin: 996 ng/ml — ABNORMAL HIGH (ref 22–316)

## 2013-10-09 MED ORDER — EPOETIN ALFA 40000 UNIT/ML IJ SOLN
40000.0000 [IU] | Freq: Once | INTRAMUSCULAR | Status: AC
  Administered 2013-10-09: 40000 [IU] via SUBCUTANEOUS
  Filled 2013-10-09: qty 1

## 2013-10-30 ENCOUNTER — Ambulatory Visit (HOSPITAL_BASED_OUTPATIENT_CLINIC_OR_DEPARTMENT_OTHER): Payer: Medicare Other

## 2013-10-30 ENCOUNTER — Other Ambulatory Visit (HOSPITAL_BASED_OUTPATIENT_CLINIC_OR_DEPARTMENT_OTHER): Payer: Medicare Other

## 2013-10-30 VITALS — BP 128/34 | HR 45 | Temp 96.0°F | Resp 20

## 2013-10-30 DIAGNOSIS — N189 Chronic kidney disease, unspecified: Secondary | ICD-10-CM

## 2013-10-30 DIAGNOSIS — N039 Chronic nephritic syndrome with unspecified morphologic changes: Secondary | ICD-10-CM

## 2013-10-30 DIAGNOSIS — D631 Anemia in chronic kidney disease: Secondary | ICD-10-CM

## 2013-10-30 DIAGNOSIS — D638 Anemia in other chronic diseases classified elsewhere: Secondary | ICD-10-CM

## 2013-10-30 LAB — CBC WITH DIFFERENTIAL/PLATELET
BASO%: 0.7 % (ref 0.0–2.0)
BASOS ABS: 0.1 10*3/uL (ref 0.0–0.1)
EOS%: 2.4 % (ref 0.0–7.0)
Eosinophils Absolute: 0.2 10*3/uL (ref 0.0–0.5)
HEMATOCRIT: 25 % — AB (ref 38.4–49.9)
HEMOGLOBIN: 8.4 g/dL — AB (ref 13.0–17.1)
LYMPH#: 1.8 10*3/uL (ref 0.9–3.3)
LYMPH%: 23.7 % (ref 14.0–49.0)
MCH: 35.3 pg — AB (ref 27.2–33.4)
MCHC: 33.6 g/dL (ref 32.0–36.0)
MCV: 105 fL — AB (ref 79.3–98.0)
MONO#: 0.6 10*3/uL (ref 0.1–0.9)
MONO%: 7.7 % (ref 0.0–14.0)
NEUT#: 4.8 10*3/uL (ref 1.5–6.5)
NEUT%: 65.5 % (ref 39.0–75.0)
PLATELETS: 162 10*3/uL (ref 140–400)
RBC: 2.38 10*6/uL — ABNORMAL LOW (ref 4.20–5.82)
RDW: 22.7 % — ABNORMAL HIGH (ref 11.0–14.6)
WBC: 7.4 10*3/uL (ref 4.0–10.3)

## 2013-10-30 MED ORDER — EPOETIN ALFA 40000 UNIT/ML IJ SOLN
40000.0000 [IU] | Freq: Once | INTRAMUSCULAR | Status: AC
  Administered 2013-10-30: 40000 [IU] via SUBCUTANEOUS
  Filled 2013-10-30: qty 1

## 2013-10-30 NOTE — Progress Notes (Signed)
1000-Pt.'s HR-45.  Pt states that he had an MD appt with his primary MD, Dr. Ashby Dawes on 10/28/13 and his HR was 40.  Pt has return appt on 10/31/13 with Dr. Ashby Dawes for f/u regarding HR.

## 2013-10-30 NOTE — Patient Instructions (Signed)
Epoetin Alfa injection What is this medicine? EPOETIN ALFA (e POE e tin AL fa) helps your body make more red blood cells. This medicine is used to treat anemia caused by chronic kidney failure, cancer chemotherapy, or HIV-therapy. It may also be used before surgery if you have anemia. This medicine may be used for other purposes; ask your health care provider or pharmacist if you have questions. COMMON BRAND NAME(S): Epogen, Procrit What should I tell my health care provider before I take this medicine? They need to know if you have any of these conditions: -blood clotting disorders -cancer patient not on chemotherapy -cystic fibrosis -heart disease, such as angina or heart failure -hemoglobin level of 12 g/dL or greater -high blood pressure -low levels of folate, iron, or vitamin B12 -seizures -an unusual or allergic reaction to erythropoietin, albumin, benzyl alcohol, hamster proteins, other medicines, foods, dyes, or preservatives -pregnant or trying to get pregnant -breast-feeding How should I use this medicine? This medicine is for injection into a vein or under the skin. It is usually given by a health care professional in a hospital or clinic setting. If you get this medicine at home, you will be taught how to prepare and give this medicine. Use exactly as directed. Take your medicine at regular intervals. Do not take your medicine more often than directed. It is important that you put your used needles and syringes in a special sharps container. Do not put them in a trash can. If you do not have a sharps container, call your pharmacist or healthcare provider to get one. Talk to your pediatrician regarding the use of this medicine in children. While this drug may be prescribed for selected conditions, precautions do apply. Overdosage: If you think you have taken too much of this medicine contact a poison control center or emergency room at once. NOTE: This medicine is only for you. Do  not share this medicine with others. What if I miss a dose? If you miss a dose, take it as soon as you can. If it is almost time for your next dose, take only that dose. Do not take double or extra doses. What may interact with this medicine? Do not take this medicine with any of the following medications: -darbepoetin alfa This list may not describe all possible interactions. Give your health care provider a list of all the medicines, herbs, non-prescription drugs, or dietary supplements you use. Also tell them if you smoke, drink alcohol, or use illegal drugs. Some items may interact with your medicine. What should I watch for while using this medicine? Visit your prescriber or health care professional for regular checks on your progress and for the needed blood tests and blood pressure measurements. It is especially important for the doctor to make sure your hemoglobin level is in the desired range, to limit the risk of potential side effects and to give you the best benefit. Keep all appointments for any recommended tests. Check your blood pressure as directed. Ask your doctor what your blood pressure should be and when you should contact him or her. As your body makes more red blood cells, you may need to take iron, folic acid, or vitamin B supplements. Ask your doctor or health care provider which products are right for you. If you have kidney disease continue dietary restrictions, even though this medication can make you feel better. Talk with your doctor or health care professional about the foods you eat and the vitamins that you take. What   side effects may I notice from receiving this medicine? Side effects that you should report to your doctor or health care professional as soon as possible: -allergic reactions like skin rash, itching or hives, swelling of the face, lips, or tongue -breathing problems -changes in vision -chest pain -confusion, trouble speaking or understanding -feeling  faint or lightheaded, falls -high blood pressure -muscle aches or pains -pain, swelling, warmth in the leg -rapid weight gain -severe headaches -sudden numbness or weakness of the face, arm or leg -trouble walking, dizziness, loss of balance or coordination -seizures (convulsions) -swelling of the ankles, feet, hands -unusually weak or tired Side effects that usually do not require medical attention (report to your doctor or health care professional if they continue or are bothersome): -diarrhea -fever, chills (flu-like symptoms) -headaches -nausea, vomiting -redness, stinging, or swelling at site where injected This list may not describe all possible side effects. Call your doctor for medical advice about side effects. You may report side effects to FDA at 1-800-FDA-1088. Where should I keep my medicine? Keep out of the reach of children. Store in a refrigerator between 2 and 8 degrees C (36 and 46 degrees F). Do not freeze or shake. Throw away any unused portion if using a single-dose vial. Multi-dose vials can be kept in the refrigerator for up to 21 days after the initial dose. Throw away unused medicine. NOTE: This sheet is a summary. It may not cover all possible information. If you have questions about this medicine, talk to your doctor, pharmacist, or health care provider.  2014, Elsevier/Gold Standard. (2008-06-09 10:25:44)  

## 2013-10-31 ENCOUNTER — Other Ambulatory Visit: Payer: Self-pay

## 2013-10-31 ENCOUNTER — Inpatient Hospital Stay (HOSPITAL_COMMUNITY)
Admission: EM | Admit: 2013-10-31 | Discharge: 2013-11-04 | DRG: 242 | Disposition: A | Discharge status: Home Health | Payer: Medicare Other | Attending: Cardiology | Admitting: Cardiology

## 2013-10-31 ENCOUNTER — Emergency Department (HOSPITAL_COMMUNITY): Payer: Medicare Other

## 2013-10-31 ENCOUNTER — Encounter (HOSPITAL_COMMUNITY): Payer: Self-pay | Admitting: Emergency Medicine

## 2013-10-31 DIAGNOSIS — E119 Type 2 diabetes mellitus without complications: Secondary | ICD-10-CM | POA: Diagnosis present

## 2013-10-31 DIAGNOSIS — I509 Heart failure, unspecified: Secondary | ICD-10-CM

## 2013-10-31 DIAGNOSIS — R001 Bradycardia, unspecified: Secondary | ICD-10-CM

## 2013-10-31 DIAGNOSIS — I5022 Chronic systolic (congestive) heart failure: Secondary | ICD-10-CM

## 2013-10-31 DIAGNOSIS — I5033 Acute on chronic diastolic (congestive) heart failure: Secondary | ICD-10-CM | POA: Diagnosis present

## 2013-10-31 DIAGNOSIS — I369 Nonrheumatic tricuspid valve disorder, unspecified: Secondary | ICD-10-CM

## 2013-10-31 DIAGNOSIS — I442 Atrioventricular block, complete: Principal | ICD-10-CM

## 2013-10-31 DIAGNOSIS — G609 Hereditary and idiopathic neuropathy, unspecified: Secondary | ICD-10-CM | POA: Diagnosis present

## 2013-10-31 DIAGNOSIS — I129 Hypertensive chronic kidney disease with stage 1 through stage 4 chronic kidney disease, or unspecified chronic kidney disease: Secondary | ICD-10-CM | POA: Diagnosis present

## 2013-10-31 DIAGNOSIS — Z79899 Other long term (current) drug therapy: Secondary | ICD-10-CM

## 2013-10-31 DIAGNOSIS — Z8249 Family history of ischemic heart disease and other diseases of the circulatory system: Secondary | ICD-10-CM

## 2013-10-31 DIAGNOSIS — E039 Hypothyroidism, unspecified: Secondary | ICD-10-CM | POA: Diagnosis present

## 2013-10-31 DIAGNOSIS — N179 Acute kidney failure, unspecified: Secondary | ICD-10-CM

## 2013-10-31 DIAGNOSIS — D509 Iron deficiency anemia, unspecified: Secondary | ICD-10-CM | POA: Diagnosis present

## 2013-10-31 DIAGNOSIS — I10 Essential (primary) hypertension: Secondary | ICD-10-CM

## 2013-10-31 DIAGNOSIS — Z87891 Personal history of nicotine dependence: Secondary | ICD-10-CM

## 2013-10-31 DIAGNOSIS — N189 Chronic kidney disease, unspecified: Secondary | ICD-10-CM | POA: Diagnosis present

## 2013-10-31 HISTORY — DX: Atrioventricular block, complete: I44.2

## 2013-10-31 HISTORY — DX: Peripheral vascular disease, unspecified: I73.9

## 2013-10-31 LAB — URINALYSIS, ROUTINE W REFLEX MICROSCOPIC
BILIRUBIN URINE: NEGATIVE
GLUCOSE, UA: NEGATIVE mg/dL
HGB URINE DIPSTICK: NEGATIVE
Ketones, ur: NEGATIVE mg/dL
Leukocytes, UA: NEGATIVE
NITRITE: NEGATIVE
PH: 5 (ref 5.0–8.0)
Protein, ur: NEGATIVE mg/dL
SPECIFIC GRAVITY, URINE: 1.018 (ref 1.005–1.030)
Urobilinogen, UA: 0.2 mg/dL (ref 0.0–1.0)

## 2013-10-31 LAB — COMPREHENSIVE METABOLIC PANEL WITH GFR
ALT: 60 U/L — ABNORMAL HIGH (ref 0–53)
AST: 58 U/L — ABNORMAL HIGH (ref 0–37)
Albumin: 3.5 g/dL (ref 3.5–5.2)
Alkaline Phosphatase: 248 U/L — ABNORMAL HIGH (ref 39–117)
BUN: 63 mg/dL — ABNORMAL HIGH (ref 6–23)
CO2: 17 meq/L — ABNORMAL LOW (ref 19–32)
Calcium: 9.1 mg/dL (ref 8.4–10.5)
Chloride: 106 meq/L (ref 96–112)
Creatinine, Ser: 2.53 mg/dL — ABNORMAL HIGH (ref 0.50–1.35)
GFR calc Af Amer: 24 mL/min — ABNORMAL LOW
GFR calc non Af Amer: 21 mL/min — ABNORMAL LOW
Glucose, Bld: 124 mg/dL — ABNORMAL HIGH (ref 70–99)
Potassium: 5 meq/L (ref 3.7–5.3)
Sodium: 138 meq/L (ref 137–147)
Total Bilirubin: 1.2 mg/dL (ref 0.3–1.2)
Total Protein: 7.2 g/dL (ref 6.0–8.3)

## 2013-10-31 LAB — PROTIME-INR
INR: 1.36 (ref 0.00–1.49)
Prothrombin Time: 16.4 s — ABNORMAL HIGH (ref 11.6–15.2)

## 2013-10-31 LAB — CBC WITH DIFFERENTIAL/PLATELET
Basophils Absolute: 0 K/uL (ref 0.0–0.1)
Basophils Relative: 0 % (ref 0–1)
Eosinophils Absolute: 0.1 K/uL (ref 0.0–0.7)
Eosinophils Relative: 1 % (ref 0–5)
HCT: 25.8 % — ABNORMAL LOW (ref 39.0–52.0)
Hemoglobin: 9 g/dL — ABNORMAL LOW (ref 13.0–17.0)
Lymphocytes Relative: 19 % (ref 12–46)
Lymphs Abs: 1.7 K/uL (ref 0.7–4.0)
MCH: 36 pg — ABNORMAL HIGH (ref 26.0–34.0)
MCHC: 34.9 g/dL (ref 30.0–36.0)
MCV: 103.2 fL — ABNORMAL HIGH (ref 78.0–100.0)
Monocytes Absolute: 0.6 K/uL (ref 0.1–1.0)
Monocytes Relative: 7 % (ref 3–12)
Neutro Abs: 6.8 K/uL (ref 1.7–7.7)
Neutrophils Relative %: 73 % (ref 43–77)
Platelets: 159 K/uL (ref 150–400)
RBC: 2.5 MIL/uL — ABNORMAL LOW (ref 4.22–5.81)
RDW: 23.1 % — ABNORMAL HIGH (ref 11.5–15.5)
WBC: 9.2 K/uL (ref 4.0–10.5)

## 2013-10-31 LAB — CBC
HCT: 29.7 % — ABNORMAL LOW (ref 39.0–52.0)
Hemoglobin: 8.7 g/dL — ABNORMAL LOW (ref 13.0–17.0)
MCH: 30.9 pg (ref 26.0–34.0)
MCHC: 29.3 g/dL — ABNORMAL LOW (ref 30.0–36.0)
MCV: 105.3 fL — ABNORMAL HIGH (ref 78.0–100.0)
Platelets: 105 10*3/uL — ABNORMAL LOW (ref 150–400)
RBC: 2.82 MIL/uL — ABNORMAL LOW (ref 4.22–5.81)
RDW: 22.7 % — ABNORMAL HIGH (ref 11.5–15.5)
WBC: 6 10*3/uL (ref 4.0–10.5)

## 2013-10-31 LAB — CREATININE, SERUM
Creatinine, Ser: 2.48 mg/dL — ABNORMAL HIGH (ref 0.50–1.35)
GFR calc Af Amer: 25 mL/min — ABNORMAL LOW
GFR calc non Af Amer: 21 mL/min — ABNORMAL LOW

## 2013-10-31 LAB — VITAMIN B12: Vitamin B-12: 1714 pg/mL — ABNORMAL HIGH (ref 211–911)

## 2013-10-31 LAB — MRSA PCR SCREENING: MRSA by PCR: NEGATIVE

## 2013-10-31 LAB — TROPONIN I: Troponin I: <0.3 ng/mL (ref <0.30)

## 2013-10-31 LAB — TSH: TSH: 16.5 u[IU]/mL — ABNORMAL HIGH (ref 0.350–4.500)

## 2013-10-31 LAB — MAGNESIUM: Magnesium: 2.1 mg/dL (ref 1.5–2.5)

## 2013-10-31 LAB — PRO B NATRIURETIC PEPTIDE: Pro B Natriuretic peptide (BNP): 3755 pg/mL — ABNORMAL HIGH (ref 0–450)

## 2013-10-31 MED ORDER — SODIUM CHLORIDE 0.9 % IV SOLN: INTRAVENOUS | Status: DC

## 2013-10-31 MED ORDER — HEPARIN SODIUM (PORCINE) 5000 UNIT/ML IJ SOLN
5000.0000 [IU] | Freq: Three times a day (TID) | INTRAMUSCULAR | Status: DC
  Administered 2013-10-31 – 2013-11-02 (×6): 5000 [IU] via SUBCUTANEOUS
  Filled 2013-10-31 (×9): qty 1

## 2013-10-31 MED ORDER — DOBUTAMINE IN D5W 4-5 MG/ML-% IV SOLN: 2.5000 ug/kg/min | INTRAVENOUS | Status: DC

## 2013-10-31 MED ORDER — DOPAMINE-DEXTROSE 3.2-5 MG/ML-% IV SOLN
2.0000 ug/kg/min | INTRAVENOUS | Status: DC
  Administered 2013-10-31 – 2013-11-03 (×4): 10 ug/kg/min via INTRAVENOUS
  Filled 2013-10-31 (×3): qty 250

## 2013-10-31 MED ORDER — ALFUZOSIN HCL ER 10 MG PO TB24
10.0000 mg | ORAL_TABLET | Freq: Every day | ORAL | Status: DC
  Administered 2013-10-31 – 2013-11-03 (×4): 10 mg via ORAL
  Filled 2013-10-31 (×5): qty 1

## 2013-10-31 MED ORDER — LEVOTHYROXINE SODIUM 137 MCG PO TABS
137.0000 ug | ORAL_TABLET | Freq: Every day | ORAL | Status: DC
  Administered 2013-11-01 – 2013-11-04 (×4): 137 ug via ORAL
  Filled 2013-10-31 (×5): qty 1

## 2013-10-31 MED ORDER — DOBUTAMINE IN D5W 4-5 MG/ML-% IV SOLN
2.5000 ug/kg/min | INTRAVENOUS | Status: DC
  Administered 2013-10-31: 7.5 ug/kg/min via INTRAVENOUS
  Administered 2013-10-31: 2.5 ug/kg/min via INTRAVENOUS
  Filled 2013-10-31: qty 250

## 2013-10-31 MED ORDER — DOPAMINE-DEXTROSE 3.2-5 MG/ML-% IV SOLN
INTRAVENOUS | Status: AC
  Filled 2013-10-31: qty 250

## 2013-10-31 NOTE — ED Notes (Signed)
Patient given ice chips per Dr. Vanita Panda.

## 2013-10-31 NOTE — Progress Notes (Signed)
*  PRELIMINARY RESULTS* Echocardiogram 2D Echocardiogram has been performed.  Anthony Hutchinson 10/31/2013, 2:04 PM

## 2013-10-31 NOTE — ED Notes (Signed)
Anthony Hutchinson from lab called sts can add on CMP and Mag to blood drawn.

## 2013-10-31 NOTE — H&P (Addendum)
Patient ID: Anthony Hutchinson MRN: [id number], DOB/AGE: [birth year]   Admit date: 10/31/2013   Primary Physician: Merrilee Seashore, MD Primary Cardiologist: None  Pt. Profile:  Complete heart failure, CHF  Problem List  Past Medical History  Diagnosis Date  . Hypertension   . Thyroid disease   . LBBB (left bundle branch block)   . Diabetes mellitus without complication   . H/O colonoscopy   . Cancer   . Hypothyroidism     nodule  . Cataracts, bilateral   . Anemia     iron deficiency anemia  . Inguinal hernia     Past Surgical History  Procedure Laterality Date  . Tonsillectomy    . Hiatal hernia repair    . Hernia repair  1979    bilateral inguinal hernia  . Thyroid lobectomy  6/85    right  . Acromionectomy  1998    rotator cuff repair  . Prostate surgery    . Abdominal aortic aneurysm repair    . Tonsillectomy    . Flexible sigmoidoscopy N/A 12/13/2012    Procedure: FLEXIBLE SIGMOIDOSCOPY;  Surgeon: Beryle Beams, MD;  Location: [facility];  Service: Endoscopy;  Laterality: N/A;  . Hot hemostasis N/A 12/13/2012    Procedure: HOT HEMOSTASIS (ARGON PLASMA COAGULATION/BICAP);  Surgeon: Beryle Beams, MD;  Location: [facility];  Service: Endoscopy;  Laterality: N/A;     Allergies  Allergies  Allergen Reactions  . Penicillins   . Codeine Sulfate     HPI  Patient is a 80 y.o. male , younger appearing with a PMHx of hypothyrodism and hypertension, who was admitted to [facility] on 10/31/2013 for evaluation of complete heart block and CHF.  The patient was in his usual state of health until the last few days when he felt significantly fatigued and short of breath. The patient is usually active but had to  Use wheelchair in the last couple of days. He admits to having LE edema in the last few weeks to the point that he had to cut his socks. He saw his PCP on Tuesday who prescribed Lasix without significant relief in SOB. He saw him today again and sent him to the ER  with bradycardia. ECG in the ER showed complete heart block with escape ventricular rhythm at the rate 30 BPM.    Home Medications  Prior to Admission medications   Medication Sig Start Date End Date Taking? Authorizing Provider  alfuzosin (UROXATRAL) 10 MG 24 hr tablet Take 10 mg by mouth every evening.     Historical Provider, MD  Cyanocobalamin (VITAMIN B 12 PO) Take by mouth daily.    Historical Provider, MD  Fe Fum-FePoly-Vit C-Vit B3 (INTEGRA PO) Take by mouth.    Historical Provider, MD  levothyroxine (SYNTHROID, LEVOTHROID) 112 MCG tablet Take 112 mcg by mouth every morning.    Historical Provider, MD  lisinopril (PRINIVIL,ZESTRIL) 10 MG tablet Take 10 mg by mouth every morning.    Historical Provider, MD  magnesium 30 MG tablet Take 30 mg by mouth daily.    Historical Provider, MD  METOPROLOL SUCCINATE ER PO Take by mouth daily.    Historical Provider, MD  Multiple Vitamin (MULTIVITAMIN WITH MINERALS) TABS Take 1 tablet by mouth every morning.    Historical Provider, MD  pyridOXINE (VITAMIN B-6) 100 MG tablet Take 100 mg by mouth every morning.    Historical Provider, MD  UNKNOWN TO PATIENT Epogen Injection for anemia every 3 weeks    Sarajane Jews  Everett Graff, MD    Family History  Family History  Problem Relation Age of Onset  . Heart disease Mother   . Heart disease Father     Social History  History   Social History  . Marital Status: Married    Spouse Name: N/A    Number of Children: N/A  . Years of Education: N/A   Occupational History  . Not on file.   Social History Main Topics  . Smoking status: Former Smoker    Types: Pipe    Quit date: 11/27/1963  . Smokeless tobacco: Never Used  . Alcohol Use: No  . Drug Use: No  . Sexual Activity: Not on file   Other Topics Concern  . Not on file   Social History Narrative  . No narrative on file     Review of Systems General:  No chills, fever, night sweats or weight changes.  Cardiovascular:  No chest pain,  dyspnea on exertion, edema, orthopnea, palpitations, paroxysmal nocturnal dyspnea. Dermatological: No rash, lesions/masses Respiratory: No cough, dyspnea Urologic: No hematuria, dysuria Abdominal:   No nausea, vomiting, diarrhea, bright red blood per rectum, melena, or hematemesis Neurologic:  No visual changes, wkns, changes in mental status. All other systems reviewed and are otherwise negative except as noted above.  Physical Exam  Blood pressure 126/39, pulse 32, temperature 98.4 F (36.9 C), temperature source Oral, resp. rate 16, SpO2 99.00%.  General: Pleasant, NAD Psych: Normal affect. Neuro: Alert and oriented X 3. Moves all extremities spontaneously. HEENT: Normal  Neck: Supple without bruits or JVD. Lungs:  Resp regular and unlabored, crackles at both bases Heart: RRR no s3, s4, or murmurs. Abdomen: Soft, non-tender, non-distended, BS + x 4.  Extremities: No clubbing, cyanosis, 2+ pitting edema B/L up to the mid calf. DP/PT/Radials 2+ and equal bilaterally.  Labs  No results found for this basename: CKTOTAL, CKMB, TROPONINI,  in the last 72 hours Lab Results  Component Value Date   WBC 7.4 10/30/2013   HGB 8.4* 10/30/2013   HCT 25.0* 10/30/2013   MCV 105.0* 10/30/2013   PLT 162 10/30/2013    Radiology/Studies  Dg Chest Port 1 View  10/31/2013   CLINICAL DATA:  Bradycardia  EXAM: PORTABLE CHEST - 1 VIEW  COMPARISON:  IMPRESSION: 1. Cardiomegaly with left ventricular and left atrial enlargement. 2. Aortic atherosclerosis. 3. Low inspiratory volumes with vascular congestion but no overt edema. 4. Left retrocardiac opacity favored to reflect atelectasis. Superimposed infiltrate difficult to exclude entirely. 5. Nonspecific patchy opacity in the right mid lung may reflect a focus of scarring, atelectasis or early infiltrate. 6. Dense for size 6 mm right mid lung pulmonary nodule favored to reflect a benign calcified granuloma.     Echocardiogram - none  ECG: 3.AVB with escape  ventricular rhythm at the rate 30 BPM.     ASSESSMENT AND PLAN  A very pleasant 80 year old male who presented in a complete heart block with acute CHF  1. 3. AVB - the patient is on metoprolol (Toprol) of unknown dose, he doesn't remember if he took it this morning. We will wait until it clears - 2 lifetimes would be 48 hours. In the meantime since he is symptomatic and we don't know his LVEF we would start Dobutamine infusion to keep  HR >60 BPM.  We will draw troponins to rule out ACS.  Dr Rayann Heman to follow.  2. Acute CHF - most probably secondary to CHB, we will order a stat  echo, if normal LVEF, we will switch dobutamine to dopamine that would also help with diuresis.   3. Hypertension - controlled  4. H/O hypothyroidism - order TSH.  5. Acute on chronic kidney failure - Crea 2.5 from 1.4 in 11/2012. BUN 63. Dopamin would help with perfusion.   6. Anemia - chronic, macrocytic - check B12 and folate level  Signed, Dorothy Spark, MD, [facility] 10/31/2013, 1:36 PM  The patient was found to be hypothyroid with TSH 16.5. Dr Dagmar Hait was consulted over the phone and advised to increase the daily dose from 112 mcg of levothyroxine to 137 mcg daily. Follow up TSH in 6 weeks.   Dorothy Spark 10/31/2013

## 2013-10-31 NOTE — Care Management Note (Addendum)
    Page 1 of 2   11/04/2013     10:12:32 AM CARE MANAGEMENT NOTE 11/04/2013  Patient:  Anthony Hutchinson, Anthony Hutchinson   Account Number:  [id number]  Date Initiated:  10/31/2013  Documentation initiated by:  Elissa Hefty  Subjective/Objective Assessment:   adm w complete heart block and chf     Action/Plan:   lives w wife   Anticipated DC Date:  11/04/2013   Anticipated DC Plan:  [place]  CM consult      PAC Choice  [place]   Choice offered to / List presented to:  C-1 Patient   DME arranged  Vassie Moselle      DME agency  [place] arranged  HH-1 RN  [place].   Status of service:   Medicare Important Message given?   (If response is "NO", the following Medicare IM given date fields will be blank) Date Medicare IM given:   Date Additional Medicare IM given:    Discharge Disposition:  [place]  Per UR Regulation:  Reviewed for med. necessity/level of care/duration of stay  If discussed at [place] of Stay Meetings, dates discussed:    Comments:  4/28  1010 debbie Elliot Meldrum rn,bsn spoke w pt. no pref to dme or hhc agency. not sure he wants hhc but would accept a few visits. ref to donna w [facility] for hhrn and hhpt. rw ordered and to be del to room prior to disch.  4/24 1519 debbie Farhaan [place] rn,bsn will moniter for dc planning needs as pt progresses.

## 2013-10-31 NOTE — ED Notes (Signed)
Pt here for continued edema and follow up from MD office, recently placed on fluid pill for swelling in legs and had follow up appt this AM and sent here for furthur eval.

## 2013-10-31 NOTE — ED Notes (Signed)
Dr. Vanita Panda at bedside, crash cart placed outside door. Patient respirations equal and unlabored. Skin pale, cool to touch, cap refill appx 2 seconds. Pt radial pulses 2+, able to speak full sentences without difficulty. Patient chief complaint leg swelling increasing over past few weeks. Pt has 4+ pitting edema on bilateral lower calves. Patient sts does not have sensation in lower legs due to peripheral neuropathy. Patient HR in the 30's- pt. Endorses lethargy and dizziness with changing of positions. Denies LOC, palpitations and pain.

## 2013-10-31 NOTE — ED Provider Notes (Signed)
CSN: [id number]     Arrival date & time 10/31/13  1220 History   First MD Initiated Contact with Patient 10/31/13 1235     Chief Complaint  Patient presents with  . Leg Swelling      HPI   Patient presents with concern of fatigue, lower extremity edema. Patient has multiple medical problems, with no recent changes in medication. Patient does take a fluid pill for the lower extremity edema. This episode of fatigue began without clear time of onset, has been progressive for several days. During this time he has had no syncope, no chest pain. He has inconsistent pain in the abdomen, nonsustained, with no clear precipitant or exacerbating or alleviating factors. No vomiting, no diarrhea. Patient has a history of left bundle branch block, takes metoprolol.    Past Medical History  Diagnosis Date  . Hypertension   . Thyroid disease   . LBBB (left bundle branch block)   . Diabetes mellitus without complication   . H/O colonoscopy   . Cancer   . Hypothyroidism     nodule  . Cataracts, bilateral   . Anemia     iron deficiency anemia  . Inguinal hernia    Past Surgical History  Procedure Laterality Date  . Tonsillectomy    . Hiatal hernia repair    . Hernia repair  1979    bilateral inguinal hernia  . Thyroid lobectomy  6/85    right  . Acromionectomy  1998    rotator cuff repair  . Prostate surgery    . Abdominal aortic aneurysm repair    . Tonsillectomy    . Flexible sigmoidoscopy N/A 12/13/2012    Procedure: FLEXIBLE SIGMOIDOSCOPY;  Surgeon: Beryle Beams, MD;  Location: [facility];  Service: Endoscopy;  Laterality: N/A;  . Hot hemostasis N/A 12/13/2012    Procedure: HOT HEMOSTASIS (ARGON PLASMA COAGULATION/BICAP);  Surgeon: Beryle Beams, MD;  Location: [facility];  Service: Endoscopy;  Laterality: N/A;   Family History  Problem Relation Age of Onset  . Heart disease Mother   . Heart disease Father    History  Substance Use Topics  . Smoking status: Former  Smoker    Types: Pipe    Quit date: 11/27/1963  . Smokeless tobacco: Never Used  . Alcohol Use: No    Review of Systems  Constitutional:       Per HPI, otherwise negative  HENT:       Per HPI, otherwise negative  Respiratory:       Per HPI, otherwise negative  Cardiovascular:       Per HPI, otherwise negative  Gastrointestinal: Negative for vomiting.  Endocrine:       Negative aside from HPI  Genitourinary:       Neg aside from HPI   Musculoskeletal:       Per HPI, otherwise negative  Skin: Negative.   Neurological: Positive for dizziness and light-headedness. Negative for syncope, facial asymmetry and headaches.      Allergies  Penicillins and Codeine sulfate  Home Medications   Prior to Admission medications   Medication Sig Start Date End Date Taking? Authorizing Provider  alfuzosin (UROXATRAL) 10 MG 24 hr tablet Take 10 mg by mouth every evening.     Historical Provider, MD  Cyanocobalamin (VITAMIN B 12 PO) Take by mouth daily.    Historical Provider, MD  Fe Fum-FePoly-Vit C-Vit B3 (INTEGRA PO) Take by mouth.    Historical Provider, MD  levothyroxine (SYNTHROID, LEVOTHROID) 112  MCG tablet Take 112 mcg by mouth every morning.    Historical Provider, MD  lisinopril (PRINIVIL,ZESTRIL) 10 MG tablet Take 10 mg by mouth every morning.    Historical Provider, MD  magnesium 30 MG tablet Take 30 mg by mouth daily.    Historical Provider, MD  METOPROLOL SUCCINATE ER PO Take by mouth daily.    Historical Provider, MD  Multiple Vitamin (MULTIVITAMIN WITH MINERALS) TABS Take 1 tablet by mouth every morning.    Historical Provider, MD  pyridOXINE (VITAMIN B-6) 100 MG tablet Take 100 mg by mouth every morning.    Historical Provider, MD  UNKNOWN TO PATIENT Epogen Injection for anemia every 3 weeks    Chauncey Cruel, MD   BP 126/39  Pulse 32  Temp(Src) 98.4 F (36.9 C) (Oral)  Resp 16  SpO2 99% Physical Exam  Nursing note and vitals reviewed. Constitutional: He is  oriented to person, place, and time. He appears well-developed. No distress.  HENT:  Head: Normocephalic and atraumatic.  Eyes: Conjunctivae and EOM are normal.  Cardiovascular: Regular rhythm and intact distal pulses.  Bradycardia present.   Murmur heard. Pulmonary/Chest: Effort normal. No stridor. No respiratory distress.  Abdominal: Soft. Normal appearance. He exhibits no distension. There is no tenderness.  Musculoskeletal: He exhibits no edema.  Neurological: He is alert and oriented to person, place, and time.  Skin: Skin is warm and dry.  Psychiatric: He has a normal mood and affect.    ED Course  Procedures (including critical care time)   I was made aware of the patient prior to his arrival given the patient's abnormal EKG.  After my initial evaluation the patient was placed on cardiac monitor devices, she supplement oxygen, had labs performed.   Labs Review Labs Reviewed  CBC WITH DIFFERENTIAL  COMPREHENSIVE METABOLIC PANEL  URINALYSIS, ROUTINE W REFLEX MICROSCOPIC  PRO B NATRIURETIC PEPTIDE  PROTIME-INR  MAGNESIUM  CBC  CREATININE, SERUM  TROPONIN I  TSH    Imaging Review Dg Chest Port 1 View  10/31/2013   CLINICAL DATA:  Bradycardia  EXAM: PORTABLE CHEST - 1 VIEW  COMPARISON:  None.  FINDINGS: Cardiomegaly with left ventricular and left atrial dilatation. Atherosclerotic calcifications noted in the transverse aorta. Mild pulmonary vascular congestion without overt edema. Small focal patchy opacity in the right mid lung. Well-defined dense for size 6 mm nodule in the right mid lung. This may represent a calcified granuloma although no comparison imaging is available for confirmation. Somewhat linear left basilar retrocardiac opacity favored to reflect atelectasis. No pneumothorax. No acute osseous abnormality.  IMPRESSION: 1. Cardiomegaly with left ventricular and left atrial enlargement. 2. Aortic atherosclerosis. 3. Low inspiratory volumes with vascular congestion  but no overt edema. 4. Left retrocardiac opacity favored to reflect atelectasis. Superimposed infiltrate difficult to exclude entirely. 5. Nonspecific patchy opacity in the right mid lung may reflect a focus of scarring, atelectasis or early infiltrate. 6. Dense for size 6 mm right mid lung pulmonary nodule favored to reflect a benign calcified granuloma.   Electronically Signed   By: Jacqulynn Cadet M.D.   On: 10/31/2013 13:26   EKG has inconsistent conduction of sinus activity, with regular rhythm, rate 33, abnormal T waves, abnormal  On cardiac monitor the patient has inconsistently demonstrated P waves, with a bradycardia, rate in the 40s/30s, abnormal Pulse oximetry 97% on room air this is normal  1:45 PM  patient remains in similar condition, with bradycardia, no syncope. I have discussed his case with  our cardiology team.  Given the patient's Procardia, concern for complete heart block, he requires admission to the cardiac critical care unit.  Repeat EKG is similar.  Update: Heart rate mid 20s, monitor shows complete heart block.  MDM   Patient presents with fatigue, and on exam is found to have heart block patient is mentating reasonably well, has good blood pressure, but with bradycardia, heart block, there is concern for imminent progression.  The patient is on metoprolol, and this medication will be stopped. The patient's evaluation in the emergency department with conducted without any decompensation.  Given the concern for complete heart block the patient required admission for likely pacemaker placement, he was admitted to the critical care unit.     CRITICAL CARE Performed by: Carmin Muskrat Total critical care time: 30 Critical care time was exclusive of separately billable procedures and treating other patients. Critical care was necessary to treat or prevent imminent or life-threatening deterioration. Critical care was time spent personally by me on the following  activities: development of treatment plan with patient and/or surrogate as well as nursing, discussions with consultants, evaluation of patient's response to treatment, examination of patient, obtaining history from patient or surrogate, ordering and performing treatments and interventions, ordering and review of laboratory studies, ordering and review of radiographic studies, pulse oximetry and re-evaluation of patient's condition.     Carmin Muskrat, MD 10/31/13 [phone]

## 2013-10-31 NOTE — ED Notes (Signed)
MD aware of vital signs.

## 2013-11-01 ENCOUNTER — Encounter (HOSPITAL_COMMUNITY): Payer: Self-pay | Admitting: *Deleted

## 2013-11-01 DIAGNOSIS — I10 Essential (primary) hypertension: Secondary | ICD-10-CM

## 2013-11-01 LAB — T3, FREE: T3, Free: 2.1 pg/mL — ABNORMAL LOW (ref 2.3–4.2)

## 2013-11-01 LAB — BASIC METABOLIC PANEL
BUN: 59 mg/dL — ABNORMAL HIGH (ref 6–23)
CHLORIDE: 110 meq/L (ref 96–112)
CO2: 17 mEq/L — ABNORMAL LOW (ref 19–32)
Calcium: 8.7 mg/dL (ref 8.4–10.5)
Creatinine, Ser: 2.45 mg/dL — ABNORMAL HIGH (ref 0.50–1.35)
GFR, EST AFRICAN AMERICAN: 25 mL/min — AB (ref >90)
GFR, EST NON AFRICAN AMERICAN: 22 mL/min — AB (ref >90)
Glucose, Bld: 158 mg/dL — ABNORMAL HIGH (ref 70–99)
POTASSIUM: 5 meq/L (ref 3.7–5.3)
SODIUM: 140 meq/L (ref 137–147)

## 2013-11-01 LAB — FOLATE RBC: RBC Folate: 1765 ng/mL — ABNORMAL HIGH (ref >280)

## 2013-11-01 LAB — T4, FREE: Free T4: 1.12 ng/dL (ref 0.80–1.80)

## 2013-11-01 NOTE — Consult Note (Signed)
ELECTROPHYSIOLOGY CONSULT NOTE    Patient ID: Anthony Hutchinson MRN: [id number], DOB/AGE: [birth year] 80 y.o.  Admit date: 10/31/2013 Date of Consult: 11/01/2013  Primary Physician: Merrilee Seashore, MD Primary Cardiologist: Meda Coffee (new this admission)  Reason for Consultation: heart block  HPI:  Anthony Hutchinson is a 80 y.o. male with a past medical history of hypertension, chronic anemia, and hypothyroidism.  He has never had cardiology evaluation in the past.  He was in his usual state of health until a few days ago when he developed weakness and shortness of breath.  He is typically independent at home but needed to use his wheelchair for the past couple of days.  He also developed LE edema.  He was evaluated by his PCP who started him on Lasix earlier in the week. On re-evaluation yesterday, he was noted to be bradycardic and he was sent to the ER for evaluation.  There he was found to be in high grade heart block and has been admitted.  He was on Metoprolol at home with last dose the morning of admission.  He has been placed on Dopamine.   Lab work this admission is notable for creat 2.45, Hgb 9, TSH 16.5, FT3 2.1. Echocardiogram demonstrated EF 45-50%, moderate TR, peak PA pressure 61, LA 41.    He denies chest pain, dizziness, palpitations, or pre-syncope.    EP has been asked to evaluate for treatment options.   ROS is negative except as outlined above.   Past Medical History  Diagnosis Date  . Hypertension   . Thyroid disease   . LBBB (left bundle branch block)   . Diabetes mellitus without complication   . H/O colonoscopy   . Cancer   . Hypothyroidism     nodule  . Cataracts, bilateral   . Anemia     iron deficiency anemia  . Inguinal hernia   . Peripheral vascular disease     Peripheral neuropathy     Surgical History:  Past Surgical History  Procedure Laterality Date  . Tonsillectomy    . Hiatal hernia repair    . Hernia repair  1979    bilateral inguinal  hernia  . Thyroid lobectomy  6/85    right  . Acromionectomy  1998    rotator cuff repair  . Prostate surgery    . Abdominal aortic aneurysm repair    . Tonsillectomy    . Flexible sigmoidoscopy N/A 12/13/2012    Procedure: FLEXIBLE SIGMOIDOSCOPY;  Surgeon: Beryle Beams, MD;  Location: [facility];  Service: Endoscopy;  Laterality: N/A;  . Hot hemostasis N/A 12/13/2012    Procedure: HOT HEMOSTASIS (ARGON PLASMA COAGULATION/BICAP);  Surgeon: Beryle Beams, MD;  Location: [facility];  Service: Endoscopy;  Laterality: N/A;     Prescriptions prior to admission  Medication Sig Dispense Refill  . alfuzosin (UROXATRAL) 10 MG 24 hr tablet Take 10 mg by mouth every evening.       Marland Kitchen epoetin alfa (EPOGEN,PROCRIT) 78295 UNIT/ML injection Inject 40,000 Units into the skin every 21 ( twenty-one) days.      . furosemide (LASIX) 20 MG tablet Take 20 mg by mouth daily.      Marland Kitchen levothyroxine (SYNTHROID, LEVOTHROID) 125 MCG tablet Take 125 mcg by mouth daily before breakfast.      . lisinopril (PRINIVIL,ZESTRIL) 10 MG tablet Take 10 mg by mouth every morning.      . Multiple Vitamin (MULTIVITAMIN WITH MINERALS) TABS Take 1 tablet by mouth every morning.      Marland Kitchen  pyridOXINE (VITAMIN B-6) 100 MG tablet Take 100 mg by mouth every morning.        Inpatient Medications:  . alfuzosin  10 mg Oral QHS  . heparin  5,000 Units Subcutaneous 3 times per day  . levothyroxine  137 mcg Oral QAC breakfast    Allergies:  Allergies  Allergen Reactions  . Penicillins   . Codeine Sulfate     History   Social History  . Marital Status: Married    Spouse Name: N/A    Number of Children: N/A  . Years of Education: N/A   Occupational History  . Not on file.   Social History Main Topics  . Smoking status: Former Smoker    Types: Pipe    Quit date: 11/27/1963  . Smokeless tobacco: Never Used  . Alcohol Use: No  . Drug Use: No  . Sexual Activity: Not on file   Other Topics Concern  . Not on file    Social History Narrative  . No narrative on file     Family History  Problem Relation Age of Onset  . Heart disease Mother   . Heart disease Father     Physical Exam: Filed Vitals:   11/01/13 0600 11/01/13 0700 11/01/13 0827 11/01/13 0900  BP: 108/71 108/23  118/60  Pulse: 36 37  37  Temp:   98.6 F (37 C)   TempSrc:   Oral   Resp: 14 20  18   Height:      Weight:      SpO2: 100% 97%  96%    GEN- The patient is elderly appearing, NAD, alert and oriented x 3 today.   Head- normocephalic, atraumatic Eyes-  Sclera clear, conjunctiva pink Ears- hearing intact Oropharynx- clear Neck- supple,  Lungs- Clear to ausculation bilaterally, normal work of breathing Heart- bradycardic regular rhythm GI- soft, NT, ND, + BS Extremities- no clubbing, cyanosis, or edema MS- no significant deformity or atrophy Skin- no rash or lesion Psych- euthymic mood, full affect Neuro- strength and sensation are intact  Labs:   Lab Results  Component Value Date   WBC 6.0 10/31/2013   HGB 8.7* 10/31/2013   HCT 29.7* 10/31/2013   MCV 105.3* 10/31/2013   PLT 105* 10/31/2013    Recent Labs Lab 10/31/13 1245  11/01/13 0400  NA 138  --  140  K 5.0  --  5.0  CL 106  --  110  CO2 17*  --  17*  BUN 63*  --  59*  CREATININE 2.53*  < > 2.45*  CALCIUM 9.1  --  8.7  PROT 7.2  --   --   BILITOT 1.2  --   --   ALKPHOS 248*  --   --   ALT 60*  --   --   AST 58*  --   --   GLUCOSE 124*  --  158*  < > = values in this interval not displayed. Lab Results  Component Value Date   TROPONINI <0.30 10/31/2013     Radiology/Studies: Dg Chest Port 1 View 10/31/2013   CLINICAL DATA:  Bradycardia  EXAM: PORTABLE CHEST - 1 VIEW  COMPARISON:  None.  FINDINGS: Cardiomegaly with left ventricular and left atrial dilatation. Atherosclerotic calcifications noted in the transverse aorta. Mild pulmonary vascular congestion without overt edema. Small focal patchy opacity in the right mid lung. Well-defined dense for  size 6 mm nodule in the right mid lung. This may represent a calcified granuloma although  no comparison imaging is available for confirmation. Somewhat linear left basilar retrocardiac opacity favored to reflect atelectasis. No pneumothorax. No acute osseous abnormality.  IMPRESSION: 1. Cardiomegaly with left ventricular and left atrial enlargement. 2. Aortic atherosclerosis. 3. Low inspiratory volumes with vascular congestion but no overt edema. 4. Left retrocardiac opacity favored to reflect atelectasis. Superimposed infiltrate difficult to exclude entirely. 5. Nonspecific patchy opacity in the right mid lung may reflect a focus of scarring, atelectasis or early infiltrate. 6. Dense for size 6 mm right mid lung pulmonary nodule favored to reflect a benign calcified granuloma.   Electronically Signed   By: Jacqulynn Cadet M.D.   On: 10/31/2013 13:26   ALP:FXTKW bradycardia with complete heart block, QRS 153  TELEMETRY: high grade heart block, intermittent Mobitz I, rates 35-40  Echo is reviewed- EF 45-50%  Assessment and Plan:  1. Complete heart block The patient has symptomatic complete heart block.  If this persists despite metoprolol wash out then  I would  recommend pacemaker implantation on Monday.   No indication for temporary pacing at this time.  2. HTN Stable No change required today  3. Hypothyroidism On replacement therapy

## 2013-11-02 DIAGNOSIS — N179 Acute kidney failure, unspecified: Secondary | ICD-10-CM

## 2013-11-02 LAB — BASIC METABOLIC PANEL
BUN: 54 mg/dL — ABNORMAL HIGH (ref 6–23)
CO2: 16 mEq/L — ABNORMAL LOW (ref 19–32)
Calcium: 8.2 mg/dL — ABNORMAL LOW (ref 8.4–10.5)
Chloride: 108 mEq/L (ref 96–112)
Creatinine, Ser: 2.16 mg/dL — ABNORMAL HIGH (ref 0.50–1.35)
GFR, EST AFRICAN AMERICAN: 29 mL/min — AB (ref >90)
GFR, EST NON AFRICAN AMERICAN: 25 mL/min — AB (ref >90)
Glucose, Bld: 126 mg/dL — ABNORMAL HIGH (ref 70–99)
POTASSIUM: 4.8 meq/L (ref 3.7–5.3)
Sodium: 139 mEq/L (ref 137–147)

## 2013-11-02 MED ORDER — VANCOMYCIN HCL IN DEXTROSE 1-5 GM/200ML-% IV SOLN
1000.0000 mg | INTRAVENOUS | Status: DC
  Filled 2013-11-02: qty 200

## 2013-11-02 MED ORDER — SODIUM CHLORIDE 0.9 % IV SOLN
INTRAVENOUS | Status: DC
  Administered 2013-11-03: 09:00:00 via INTRAVENOUS

## 2013-11-02 MED ORDER — CHLORHEXIDINE GLUCONATE 4 % EX LIQD
60.0000 mL | Freq: Once | CUTANEOUS | Status: AC
  Administered 2013-11-02: 4 via TOPICAL
  Filled 2013-11-02: qty 15

## 2013-11-02 MED ORDER — SODIUM CHLORIDE 0.9 % IR SOLN
80.0000 mg | Status: DC
  Filled 2013-11-02: qty 2

## 2013-11-02 MED ORDER — CHLORHEXIDINE GLUCONATE 4 % EX LIQD
60.0000 mL | Freq: Once | CUTANEOUS | Status: AC
  Administered 2013-11-03: 4 via TOPICAL
  Filled 2013-11-02: qty 15

## 2013-11-02 NOTE — Progress Notes (Signed)
SUBJECTIVE: The patient is doing well today.  At this time, he denies chest pain, shortness of breath, or any new concerns.  Marland Kitchen alfuzosin  10 mg Oral QHS  . levothyroxine  137 mcg Oral QAC breakfast   . sodium chloride Stopped (11/02/13 0800)  . DOPamine 10 mcg/kg/min (11/02/13 0800)    OBJECTIVE: Physical Exam: Filed Vitals:   11/02/13 0600 11/02/13 0700 11/02/13 0800 11/02/13 1106  BP: 151/31 130/30 135/25 146/31  Pulse: 41 41 37 41  Temp:  98.8 F (37.1 C)  98 F (36.7 C)  TempSrc:  Oral  Oral  Resp: 16 19 13 15   Height:      Weight:      SpO2: 98% 95% 96% 99%    Intake/Output Summary (Last 24 hours) at 11/02/13 1113 Last data filed at 11/02/13 0921  Gross per 24 hour  Intake 1346.1 ml  Output    900 ml  Net  446.1 ml    Telemetry reveals sinus rhythm with complete heart block  LABS: Basic Metabolic Panel:  Recent Labs  10/31/13 1245  11/01/13 0400 11/02/13 0333  NA 138  --  140 139  K 5.0  --  5.0 4.8  CL 106  --  110 108  CO2 17*  --  17* 16*  GLUCOSE 124*  --  158* 126*  BUN 63*  --  59* 54*  CREATININE 2.53*  < > 2.45* 2.16*  CALCIUM 9.1  --  8.7 8.2*  MG 2.1  --   --   --   < > = values in this interval not displayed. Liver Function Tests:  Recent Labs  10/31/13 1245  AST 58*  ALT 60*  ALKPHOS 248*  BILITOT 1.2  PROT 7.2  ALBUMIN 3.5   No results found for this basename: LIPASE, AMYLASE,  in the last 72 hours CBC:  Recent Labs  10/31/13 1245 10/31/13 1630  WBC 9.2 6.0  NEUTROABS 6.8  --   HGB 9.0* 8.7*  HCT 25.8* 29.7*  MCV 103.2* 105.3*  PLT 159 105*   Cardiac Enzymes:  Recent Labs  10/31/13 1630  TROPONINI <0.30   BNP: No components found with this basename: POCBNP,  D-Dimer: No results found for this basename: DDIMER,  in the last 72 hours Hemoglobin A1C: No results found for this basename: HGBA1C,  in the last 72 hours Fasting Lipid Panel: No results found for this basename: CHOL, HDL, LDLCALC, TRIG, CHOLHDL,  LDLDIRECT,  in the last 72 hours Thyroid Function Tests:  Recent Labs  10/31/13 1257 10/31/13 1918  TSH 16.500*  --   T3FREE  --  2.1*   Anemia Panel:  Recent Labs  10/31/13 1630  VITAMINB12 1714*    RADIOLOGY: Dg Chest Port 1 View  10/31/2013   CLINICAL DATA:  Bradycardia  EXAM: PORTABLE CHEST - 1 VIEW  COMPARISON:  None.  FINDINGS: Cardiomegaly with left ventricular and left atrial dilatation. Atherosclerotic calcifications noted in the transverse aorta. Mild pulmonary vascular congestion without overt edema. Small focal patchy opacity in the right mid lung. Well-defined dense for size 6 mm nodule in the right mid lung. This may represent a calcified granuloma although no comparison imaging is available for confirmation. Somewhat linear left basilar retrocardiac opacity favored to reflect atelectasis. No pneumothorax. No acute osseous abnormality.  IMPRESSION: 1. Cardiomegaly with left ventricular and left atrial enlargement. 2. Aortic atherosclerosis. 3. Low inspiratory volumes with vascular congestion but no overt edema. 4. Left retrocardiac opacity favored  to reflect atelectasis. Superimposed infiltrate difficult to exclude entirely. 5. Nonspecific patchy opacity in the right mid lung may reflect a focus of scarring, atelectasis or early infiltrate. 6. Dense for size 6 mm right mid lung pulmonary nodule favored to reflect a benign calcified granuloma.   Electronically Signed   By: Jacqulynn Cadet M.D.   On: 10/31/2013 13:26    GEN- The patient is elderly appearing, NAD, alert and oriented x 3 today.   Head- normocephalic, atraumatic Eyes-  Sclera clear, conjunctiva pink Ears- hearing intact Oropharynx- clear Neck- supple,  Lungs- Clear to ausculation bilaterally, normal work of breathing Heart- bradycardic regular rhythm GI- soft, NT, ND, + BS Extremities- no clubbing, cyanosis, or edema MS- no significant deformity or atrophy Skin- no rash or lesion Psych- euthymic mood,  full affect Neuro- strength and sensation are intact  Assessment and Plan:  1. Complete heart block The patient has symptomatic complete heart block. This persists despite metoprolol wash out then  I would  recommend pacemaker implantation on Monday.   No indication for temporary pacing at this time. Risks, benefits, alternatives to pacemaker implantation were discussed in detail with the patient today. The patient understands that the risks include but are not limited to bleeding, infection, pneumothorax, perforation, tamponade, vascular damage, renal failure, MI, stroke, death,  and lead dislodgement and wishes to proceed. NPO after midnight Hold heparin See preprocedure orders  2. HTN Stable No change required today  3. Hypothyroidism On replacement therapy

## 2013-11-03 ENCOUNTER — Encounter (HOSPITAL_COMMUNITY)
Admission: EM | Disposition: A | Discharge status: Home Health | Payer: Self-pay | Source: Home / Self Care | Attending: Cardiology

## 2013-11-03 DIAGNOSIS — I442 Atrioventricular block, complete: Principal | ICD-10-CM | POA: Diagnosis present

## 2013-11-03 HISTORY — PX: PACEMAKER INSERTION: SHX728

## 2013-11-03 HISTORY — PX: PERMANENT PACEMAKER INSERTION: SHX5480

## 2013-11-03 LAB — BASIC METABOLIC PANEL
BUN: 45 mg/dL — ABNORMAL HIGH (ref 6–23)
CHLORIDE: 106 meq/L (ref 96–112)
CO2: 18 meq/L — AB (ref 19–32)
CREATININE: 1.81 mg/dL — AB (ref 0.50–1.35)
Calcium: 8.4 mg/dL (ref 8.4–10.5)
GFR calc non Af Amer: 31 mL/min — ABNORMAL LOW (ref >90)
GFR, EST AFRICAN AMERICAN: 36 mL/min — AB (ref >90)
Glucose, Bld: 122 mg/dL — ABNORMAL HIGH (ref 70–99)
Potassium: 4.8 mEq/L (ref 3.7–5.3)
SODIUM: 136 meq/L — AB (ref 137–147)

## 2013-11-03 SURGERY — PERMANENT PACEMAKER INSERTION
Anesthesia: LOCAL

## 2013-11-03 MED ORDER — ALFUZOSIN HCL ER 10 MG PO TB24: 10.0000 mg | ORAL_TABLET | Freq: Every evening | ORAL | Status: DC

## 2013-11-03 MED ORDER — MIDAZOLAM HCL 5 MG/5ML IJ SOLN
INTRAMUSCULAR | Status: AC
  Filled 2013-11-03: qty 5

## 2013-11-03 MED ORDER — VANCOMYCIN HCL IN DEXTROSE 1-5 GM/200ML-% IV SOLN
1000.0000 mg | Freq: Two times a day (BID) | INTRAVENOUS | Status: AC
  Administered 2013-11-04: 1000 mg via INTRAVENOUS
  Filled 2013-11-03: qty 200

## 2013-11-03 MED ORDER — LISINOPRIL 10 MG PO TABS
10.0000 mg | ORAL_TABLET | Freq: Every morning | ORAL | Status: DC
  Administered 2013-11-04: 10 mg via ORAL
  Filled 2013-11-03: qty 1

## 2013-11-03 MED ORDER — LEVOTHYROXINE SODIUM 125 MCG PO TABS: 125.0000 ug | ORAL_TABLET | Freq: Every day | ORAL | Status: DC

## 2013-11-03 MED ORDER — ACETAMINOPHEN 325 MG PO TABS: 325.0000 mg | ORAL_TABLET | ORAL | Status: DC | PRN

## 2013-11-03 MED ORDER — VITAMIN B-6 100 MG PO TABS
100.0000 mg | ORAL_TABLET | Freq: Every morning | ORAL | Status: DC
  Administered 2013-11-04: 100 mg via ORAL
  Filled 2013-11-03: qty 1

## 2013-11-03 MED ORDER — ADULT MULTIVITAMIN W/MINERALS CH
1.0000 | ORAL_TABLET | Freq: Every morning | ORAL | Status: DC
  Administered 2013-11-04: 1 via ORAL
  Filled 2013-11-03: qty 1

## 2013-11-03 MED ORDER — ONDANSETRON HCL 4 MG/2ML IJ SOLN: 4.0000 mg | Freq: Four times a day (QID) | INTRAMUSCULAR | Status: DC | PRN

## 2013-11-03 MED ORDER — FUROSEMIDE 20 MG PO TABS
20.0000 mg | ORAL_TABLET | Freq: Every day | ORAL | Status: DC
  Administered 2013-11-03 – 2013-11-04 (×2): 20 mg via ORAL
  Filled 2013-11-03 (×2): qty 1

## 2013-11-03 MED ORDER — FENTANYL CITRATE 0.05 MG/ML IJ SOLN
INTRAMUSCULAR | Status: AC
  Filled 2013-11-03: qty 2

## 2013-11-03 NOTE — CV Procedure (Signed)
SURGEON:  Cristopher Peru, MD     PREPROCEDURE DIAGNOSIS:  Symptomatic Bradycardia due to complete heart block    POSTPROCEDURE DIAGNOSIS:  Same as preprocedure diagnosis     PROCEDURES:   1.  Pacemaker implantation.     INTRODUCTION: Anthony Hutchinson is a 80 y.o. male  with a history of bradycardia who presents today for pacemaker implantation.  The patient reports intermittent episodes of dizziness over the past few months.  No reversible causes have been identified.  The patient therefore presents today for pacemaker implantation.     DESCRIPTION OF PROCEDURE:  Informed written consent was obtained, and   the patient was brought to the electrophysiology lab in a fasting state.  The patient required no sedation for the procedure today.  The patients left chest was prepped and draped in the usual sterile fashion by the EP lab staff. The skin overlying the left deltopectoral region was infiltrated with lidocaine for local analgesia.  A 4-cm incision was made over the left deltopectoral region.  A left subcutaneous pacemaker pocket was fashioned using a combination of sharp and blunt dissection. Electrocautery was required to assure hemostasis.    Left Upper Extremity Venography: A venogram of the left upper extremity was performed, which revealed a large left cephalic vein, which emptied into a large left subclavian vein.  The left axillary vein was moderate in size.    RA/RV Lead Placement: The left cephalic vein was therefore directly visualized and cannulated.  Through the left cephalic vein, a St. Jude (serial number [id number]) right atrial lead and a [employer].Jude (serial number [id number]) right ventricular lead were advanced with fluoroscopic visualization into the right atrial appendage and right ventricular apical septal positions respectively.  Initial atrial lead P- waves measured 2.2 mV with impedance of 424 ohms and a threshold of 1.3 V at 0.5 msec.  Right ventricular lead R-waves measured 5.6 mV  with an impedance of 474 ohms and a threshold of 0.9 V at 0.5 msec.  Both leads were secured to the pectoralis fascia using #2-0 silk over the suture sleeves.   Device Placement:  The leads were then connected to a St. Jude DDD(serial number [id number]) pacemaker.  The pocket was irrigated with copious gentamicin solution.  The pacemaker was then placed into the pocket.  The pocket was then closed in 2 layers with 2.0 Vicryl suture for the subcutaneous and subcuticular layers.  Steri-Strips and a sterile dressing were then applied.  There were no early apparent complications.     CONCLUSIONS:   1. Successful implantation of a St. Jude dual-chamber pacemaker for symptomatic bradycardia due to complete heart block.  2. No early apparent complications.           Cristopher Peru, MD 11/03/2013 4:29 PM

## 2013-11-03 NOTE — Discharge Summary (Addendum)
ELECTROPHYSIOLOGY PROCEDURE DISCHARGE SUMMARY    Patient ID: Anthony Hutchinson,  MRN: [id number], DOB/AGE: [birth year] y.o.  Admit date: 10/31/2013 Discharge date: 11/03/2013  Primary Care Physician: Merrilee Seashore, MD Electrophysiologist: Lovena Le  Primary Discharge Diagnosis:  Symptomatic complete heart block status post pacemaker implant this admission  Secondary Discharge Diagnosis:  1.  Hypertension 2.  Chronic anemia 3.  Hypothyroidism 4. Acute on chronic diastolic heart failure exacerbated by complete heart block.  Allergies  Allergen Reactions  . Penicillins   . Codeine Sulfate      Procedures This Admission: 1.  Echocardiogram on 10/31/13 demonstrated EF 45-50%, mild LVH, LA mildly dilated, RV mildly dilated, moderate TR, PA pressure 61 2.  Implantation of a dual chamber pacemaker on 11-03-13 by Dr Lovena Le. See op report for full details. There were no early apparent complications.  3.  CXR on 11-04-13 demonstrated no PTX  Brief HPI/Hospital Course:  Anthony Hutchinson is a 80 y.o. male with a past medical history of hypertension, chronic anemia, and hypothyroidism. He has never had cardiology evaluation in the past. He was in his usual state of health until a few days prior to admission when he developed weakness and shortness of breath. His BNP was elevated. He is typically independent at home but needed to use his wheelchair. He also developed LE edema. He was evaluated by his PCP who started him on Lasix earlier in the week. On re-evaluation, he was noted to be bradycardic and he was sent to the ER for evaluation. There he was found to be in high grade heart block and has been admitted. He was on Metoprolol at home with last dose the morning of admission. He was placed on Dopamine and his Metoprolol was allowed to wash out.  He did not have return of intrinsic conduction.  Risks, benefits, and alternatives to pacemaker implantation were reviewed with the patient who wished  to proceed.  The patient underwent implantation of a STJ pacemaker with details as outlined above.   He was monitored on telemetry overnight which demonstrated sinus rhythm with ventricular pacing.  Left chest was without hematoma or ecchymosis.  The device was interrogated and found to be functioning normally.  CXR was obtained and demonstrated no pneumothorax status post device implantation.  Wound care, arm mobility, and restrictions were reviewed with the patient.  Dr Lovena Le examined the patient and considered him stable for discharge to home after ambulation had taken place.   Discharge Vitals: Blood pressure 155/27, pulse 36, temperature 98.4 F (36.9 C), temperature source Oral, resp. rate 15, height 5\' 8"  (1.727 m), weight 166 lb 3.6 oz (75.4 kg), SpO2 98.00%.    Exam  Well appearing elderly man, NAD HEENT: Unremarkable,[place], AT Neck:  6 JVD, no thyromegally Back:  No CVA tenderness Lungs:  Clear with no wheezes, rales, or rhonchi, pacemaker site with no hematoma. HEART:  Regular rate rhythm, no murmurs, no rubs, no clicks Abd:  soft, positive bowel sounds, no organomegally, no rebound, no guarding Ext:  2 plus pulses, no edema, no cyanosis, no clubbing Skin:  No rashes no nodules Neuro:  CN II through XII intact, motor grossly intact  Labs:   Lab Results  Component Value Date   WBC 6.0 10/31/2013   HGB 8.7* 10/31/2013   HCT 29.7* 10/31/2013   MCV 105.3* 10/31/2013   PLT 105* 10/31/2013    Recent Labs Lab 10/31/13 1245  11/03/13 0340  NA 138  < > 136*  K  5.0  < > 4.8  CL 106  < > 106  CO2 17*  < > 18*  BUN 63*  < > 45*  CREATININE 2.53*  < > 1.81*  CALCIUM 9.1  < > 8.4  PROT 7.2  --   --   BILITOT 1.2  --   --   ALKPHOS 248*  --   --   ALT 60*  --   --   AST 58*  --   --   GLUCOSE 124*  < > 122*  < > = values in this interval not displayed. Lab Results  Component Value Date   TROPONINI <0.30 10/31/2013     Discharge Medications:    Medication List    ASK your  doctor about these medications       alfuzosin 10 MG 24 hr tablet  Commonly known as:  UROXATRAL  Take 10 mg by mouth every evening.     epoetin alfa 40000 UNIT/ML injection  Commonly known as:  EPOGEN,PROCRIT  Inject 40,000 Units into the skin every 21 ( twenty-one) days.     furosemide 20 MG tablet  Commonly known as:  LASIX  Take 20 mg by mouth daily.     levothyroxine 125 MCG tablet  Commonly known as:  SYNTHROID, LEVOTHROID  Take 125 mcg by mouth daily before breakfast.     lisinopril 10 MG tablet  Commonly known as:  PRINIVIL,ZESTRIL  Take 10 mg by mouth every morning.     multivitamin with minerals Tabs tablet  Take 1 tablet by mouth every morning.     pyridOXINE 100 MG tablet  Commonly known as:  VITAMIN B-6  Take 100 mg by mouth every morning.        Disposition:   Future Appointments Provider Department Dept Phone   11/13/2013 3:00 PM [facility] [place] [facility] [phone]   11/20/2013 9:30 AM [facility] [phone]   11/20/2013 10:00 AM [facility] [phone]   12/11/2013 9:30 AM [facility] [phone]   12/11/2013 10:00 AM [facility] [phone]   01/01/2014 9:30 AM [facility] [phone]   01/01/2014 10:00 AM [facility] [phone]   01/22/2014 9:30 AM [facility] [phone]   01/22/2014 10:00 AM [facility] [phone]   02/12/2014 9:30 AM [facility] [phone]   02/12/2014 10:00 AM [facility] [phone]   02/12/2014 11:30 AM  Evans Lance, MD [place] [phone]   03/05/2014 9:30 AM [facility] [phone]   03/05/2014 10:00 AM [facility] [phone]   03/26/2014 9:30 AM [facility] [phone]   03/26/2014 10:00 AM [facility] [phone]   04/16/2014 9:30 AM [facility] [phone]   04/16/2014 10:00 AM [facility] [phone]   05/07/2014 9:30 AM [facility] [phone]   05/07/2014 10:00 AM  [facility] [phone]   05/28/2014 9:30 AM [facility] [phone]   05/28/2014 10:00 AM [facility] [phone]   06/18/2014 9:30 AM [facility] [phone]   06/18/2014 10:00 AM [facility] [phone]   07/09/2014 9:30 AM [facility] [phone]   07/09/2014 10:00 AM [facility] [phone]   07/30/2014 9:30 AM [facility] [phone]   07/30/2014 10:00 AM [facility] [phone]   08/20/2014 9:30 AM [facility] [phone]   08/20/2014 10:00 AM [facility] [phone]   09/10/2014 9:30 AM [facility] [phone]   09/10/2014 10:00 AM Chauncey Cruel, MD [facility] [phone]   09/10/2014 10:30 AM [facility] [phone]       Duration of Discharge Encounter: less than 30 minutes including physician time.  Signed,  Mikle Bosworth.D.

## 2013-11-03 NOTE — Progress Notes (Signed)
Orthopedic Tech Progress Note Patient Details:  Anthony Hutchinson 20-Jun-1922 [id number]  Ortho Devices Type of Ortho Device: Arm sling Ortho Device/Splint Location: LUE Ortho Device/Splint Interventions: Ordered;Application   Braulio Bosch 11/03/2013, 11:09 PM

## 2013-11-03 NOTE — H&P (View-Only) (Signed)
SUBJECTIVE: The patient is doing well today.  At this time, he denies chest pain, shortness of breath, or any new concerns.  He is worried about his wife at home with dementia.  CURRENT MEDICATIONS: . alfuzosin  10 mg Oral QHS  . gentamicin irrigation  80 mg Irrigation On Call  . levothyroxine  137 mcg Oral QAC breakfast  . vancomycin  1,000 mg Intravenous On Call   . sodium chloride Stopped (11/02/13 0800)  . sodium chloride    . DOPamine 10 mcg/kg/min (11/03/13 0021)    OBJECTIVE: Physical Exam: Filed Vitals:   11/03/13 0300 11/03/13 0314 11/03/13 0400 11/03/13 0500  BP: 150/33  165/31 155/35  Pulse: 39  42 41  Temp:  98.5 F (36.9 C)    TempSrc:  Oral    Resp: 19  23 18   Height:      Weight:      SpO2: 94%  96% 96%    Intake/Output Summary (Last 24 hours) at 11/03/13 0654 Last data filed at 11/03/13 0500  Gross per 24 hour  Intake  721.5 ml  Output   1050 ml  Net -328.5 ml    Telemetry reveals sinus rhythm with complete heart block  LABS: Basic Metabolic Panel:  Recent Labs  10/31/13 1245  11/02/13 0333 11/03/13 0340  NA 138  < > 139 136*  K 5.0  < > 4.8 4.8  CL 106  < > 108 106  CO2 17*  < > 16* 18*  GLUCOSE 124*  < > 126* 122*  BUN 63*  < > 54* 45*  CREATININE 2.53*  < > 2.16* 1.81*  CALCIUM 9.1  < > 8.2* 8.4  MG 2.1  --   --   --   < > = values in this interval not displayed. Liver Function Tests:  Recent Labs  10/31/13 1245  AST 58*  ALT 60*  ALKPHOS 248*  BILITOT 1.2  PROT 7.2  ALBUMIN 3.5  CBC:  Recent Labs  10/31/13 1245 10/31/13 1630  WBC 9.2 6.0  NEUTROABS 6.8  --   HGB 9.0* 8.7*  HCT 25.8* 29.7*  MCV 103.2* 105.3*  PLT 159 105*   Cardiac Enzymes:  Recent Labs  10/31/13 1630  TROPONINI <0.30   Thyroid Function Tests:  Recent Labs  10/31/13 1257 10/31/13 1918  TSH 16.500*  --   T3FREE  --  2.1*   Anemia Panel:  Recent Labs  10/31/13 1630  VITAMINB12 1714*    RADIOLOGY: Dg Chest Port 1 View  10/31/2013   CLINICAL DATA:  Bradycardia  EXAM: PORTABLE CHEST - 1 VIEW  COMPARISON:  None.  FINDINGS: Cardiomegaly with left ventricular and left atrial dilatation. Atherosclerotic calcifications noted in the transverse aorta. Mild pulmonary vascular congestion without overt edema. Small focal patchy opacity in the right mid lung. Well-defined dense for size 6 mm nodule in the right mid lung. This may represent a calcified granuloma although no comparison imaging is available for confirmation. Somewhat linear left basilar retrocardiac opacity favored to reflect atelectasis. No pneumothorax. No acute osseous abnormality.  IMPRESSION: 1. Cardiomegaly with left ventricular and left atrial enlargement. 2. Aortic atherosclerosis. 3. Low inspiratory volumes with vascular congestion but no overt edema. 4. Left retrocardiac opacity favored to reflect atelectasis. Superimposed infiltrate difficult to exclude entirely. 5. Nonspecific patchy opacity in the right mid lung may reflect a focus of scarring, atelectasis or early infiltrate. 6. Dense for size 6 mm right mid lung pulmonary nodule favored to  reflect a benign calcified granuloma.   Electronically Signed   By: Jacqulynn Cadet M.D.   On: 10/31/2013 13:26    GEN- The patient is elderly appearing, NAD, alert and oriented x 3 today.   Head- normocephalic, atraumatic Eyes-  Sclera clear, conjunctiva pink Ears- hearing intact Oropharynx- clear Neck- supple,  Lungs- Clear to ausculation bilaterally, normal work of breathing Heart- bradycardic regular rhythm GI- soft, NT, ND, + BS Extremities- no clubbing, cyanosis, or edema MS- no significant deformity or atrophy Skin- no rash or lesion Psych- euthymic mood, full affect Neuro- strength and sensation are intact  Assessment and Plan:  1. Complete heart block The patient has symptomatic complete heart block. This persists despite metoprolol wash out.   No indication for temporary pacing at this  time. Risks, benefits, alternatives to pacemaker implantation were discussed in detail with the patient today. The patient understands that the risks include but are not limited to bleeding, infection, pneumothorax, perforation, tamponade, vascular damage, renal failure, MI, stroke, death,  and lead dislodgement and wishes to proceed.  2. HTN Stable No change required today  3. Hypothyroidism On replacement therapy   Anthony Hutchinson.D.

## 2013-11-03 NOTE — Progress Notes (Addendum)
SUBJECTIVE: The patient is doing well today.  At this time, he denies chest pain, shortness of breath, or any new concerns.  He is worried about his wife at home with dementia.  CURRENT MEDICATIONS: . alfuzosin  10 mg Oral QHS  . gentamicin irrigation  80 mg Irrigation On Call  . levothyroxine  137 mcg Oral QAC breakfast  . vancomycin  1,000 mg Intravenous On Call   . sodium chloride Stopped (11/02/13 0800)  . sodium chloride    . DOPamine 10 mcg/kg/min (11/03/13 0021)    OBJECTIVE: Physical Exam: Filed Vitals:   11/03/13 0300 11/03/13 0314 11/03/13 0400 11/03/13 0500  BP: 150/33  165/31 155/35  Pulse: 39  42 41  Temp:  98.5 F (36.9 C)    TempSrc:  Oral    Resp: 19  23 18   Height:      Weight:      SpO2: 94%  96% 96%    Intake/Output Summary (Last 24 hours) at 11/03/13 0654 Last data filed at 11/03/13 0500  Gross per 24 hour  Intake  721.5 ml  Output   1050 ml  Net -328.5 ml    Telemetry reveals sinus rhythm with complete heart block  LABS: Basic Metabolic Panel:  Recent Labs  10/31/13 1245  11/02/13 0333 11/03/13 0340  NA 138  < > 139 136*  K 5.0  < > 4.8 4.8  CL 106  < > 108 106  CO2 17*  < > 16* 18*  GLUCOSE 124*  < > 126* 122*  BUN 63*  < > 54* 45*  CREATININE 2.53*  < > 2.16* 1.81*  CALCIUM 9.1  < > 8.2* 8.4  MG 2.1  --   --   --   < > = values in this interval not displayed. Liver Function Tests:  Recent Labs  10/31/13 1245  AST 58*  ALT 60*  ALKPHOS 248*  BILITOT 1.2  PROT 7.2  ALBUMIN 3.5  CBC:  Recent Labs  10/31/13 1245 10/31/13 1630  WBC 9.2 6.0  NEUTROABS 6.8  --   HGB 9.0* 8.7*  HCT 25.8* 29.7*  MCV 103.2* 105.3*  PLT 159 105*   Cardiac Enzymes:  Recent Labs  10/31/13 1630  TROPONINI <0.30   Thyroid Function Tests:  Recent Labs  10/31/13 1257 10/31/13 1918  TSH 16.500*  --   T3FREE  --  2.1*   Anemia Panel:  Recent Labs  10/31/13 1630  VITAMINB12 1714*    RADIOLOGY: Dg Chest Port 1 View  10/31/2013   CLINICAL DATA:  Bradycardia  EXAM: PORTABLE CHEST - 1 VIEW  COMPARISON:  None.  FINDINGS: Cardiomegaly with left ventricular and left atrial dilatation. Atherosclerotic calcifications noted in the transverse aorta. Mild pulmonary vascular congestion without overt edema. Small focal patchy opacity in the right mid lung. Well-defined dense for size 6 mm nodule in the right mid lung. This may represent a calcified granuloma although no comparison imaging is available for confirmation. Somewhat linear left basilar retrocardiac opacity favored to reflect atelectasis. No pneumothorax. No acute osseous abnormality.  IMPRESSION: 1. Cardiomegaly with left ventricular and left atrial enlargement. 2. Aortic atherosclerosis. 3. Low inspiratory volumes with vascular congestion but no overt edema. 4. Left retrocardiac opacity favored to reflect atelectasis. Superimposed infiltrate difficult to exclude entirely. 5. Nonspecific patchy opacity in the right mid lung may reflect a focus of scarring, atelectasis or early infiltrate. 6. Dense for size 6 mm right mid lung pulmonary nodule favored to  reflect a benign calcified granuloma.   Electronically Signed   By: Jacqulynn Cadet M.D.   On: 10/31/2013 13:26    GEN- The patient is elderly appearing, NAD, alert and oriented x 3 today.   Head- normocephalic, atraumatic Eyes-  Sclera clear, conjunctiva pink Ears- hearing intact Oropharynx- clear Neck- supple,  Lungs- Clear to ausculation bilaterally, normal work of breathing Heart- bradycardic regular rhythm GI- soft, NT, ND, + BS Extremities- no clubbing, cyanosis, or edema MS- no significant deformity or atrophy Skin- no rash or lesion Psych- euthymic mood, full affect Neuro- strength and sensation are intact  Assessment and Plan:  1. Complete heart block The patient has symptomatic complete heart block. This persists despite metoprolol wash out.   No indication for temporary pacing at this  time. Risks, benefits, alternatives to pacemaker implantation were discussed in detail with the patient today. The patient understands that the risks include but are not limited to bleeding, infection, pneumothorax, perforation, tamponade, vascular damage, renal failure, MI, stroke, death,  and lead dislodgement and wishes to proceed.  2. HTN Stable No change required today  3. Acute on chronic diastolic CHF - this is exacerbated by his CHB. He will have his beta blocker restarted once his PPM has been inserted. He will maintain a low sodium diet.  4. Hypothyroidism On replacement therapy   Mikle Bosworth.D.

## 2013-11-03 NOTE — Interval H&P Note (Signed)
History and Physical Interval Note:  11/03/2013 2:59 PM  Anthony Hutchinson  has presented today for surgery, with the diagnosis of bradicardia  The various methods of treatment have been discussed with the patient and family. After consideration of risks, benefits and other options for treatment, the patient has consented to  Procedure(s): PERMANENT PACEMAKER INSERTION (N/A) as a surgical intervention .  The patient's history has been reviewed, patient examined, no change in status, stable for surgery.  I have reviewed the patient's chart and labs.  Questions were answered to the patient's satisfaction.     Norlene Duel.D.

## 2013-11-04 ENCOUNTER — Encounter (HOSPITAL_COMMUNITY): Payer: Self-pay | Admitting: *Deleted

## 2013-11-04 ENCOUNTER — Inpatient Hospital Stay (HOSPITAL_COMMUNITY): Payer: Medicare Other

## 2013-11-04 LAB — BASIC METABOLIC PANEL
BUN: 38 mg/dL — ABNORMAL HIGH (ref 6–23)
CO2: 20 meq/L (ref 19–32)
Calcium: 8.1 mg/dL — ABNORMAL LOW (ref 8.4–10.5)
Chloride: 105 mEq/L (ref 96–112)
Creatinine, Ser: 1.64 mg/dL — ABNORMAL HIGH (ref 0.50–1.35)
GFR calc non Af Amer: 35 mL/min — ABNORMAL LOW (ref >90)
GFR, EST AFRICAN AMERICAN: 41 mL/min — AB (ref >90)
Glucose, Bld: 119 mg/dL — ABNORMAL HIGH (ref 70–99)
POTASSIUM: 4.7 meq/L (ref 3.7–5.3)
Sodium: 136 mEq/L — ABNORMAL LOW (ref 137–147)

## 2013-11-04 MED ORDER — LEVOTHYROXINE SODIUM 137 MCG PO TABS: 137.0000 ug | ORAL_TABLET | Freq: Every day | ORAL | Status: DC

## 2013-11-04 NOTE — Progress Notes (Signed)
CARDIAC REHAB PHASE I   PRE:  Rate/Rhythm: 87 SR  BP:  Sitting: 125/41      SaO2: 98 RA  MODE:  Ambulation: 100 ft   POST:  Rate/Rhythm: 95 SR  BP:  Sitting: 112/49     SaO2: 97 RA  Pt ambulated assist x1 with RW. Fairly steady, verbal cues to stand tall/close to walker at times. Patient aware of L arm restrictions. No major complaints. Patient educated about sodium restrictions and daily weights. Patient would like a RW and HH PT for strengthening and safety. Case manager notified. [phone]  Victorino Dike, BSN 11/04/2013 9:57 AM

## 2013-11-04 NOTE — Discharge Instructions (Signed)
° °  Supplemental Discharge Instructions for  Pacemaker/Defibrillator Patients  Activity No heavy lifting or vigorous activity with your left/right arm for 6 to 8 weeks.  Do not raise your left/right arm above your head for one week.  Gradually raise your affected arm as drawn below.                       05/01                   05/02                    05/03                    05/04            NO DRIVING for  1 week    ; you may begin driving on     63[address]33     . WOUND CARE   Keep the wound area clean and dry.  Do not get this area wet for one week. No showers for one week; you may shower on      05/05        .   The tape/steri-strips on your wound will fall off; do not pull them off.  No bandage is needed on the site.  DO  NOT apply any creams, oils, or ointments to the wound area.   If you notice any drainage or discharge from the wound, any swelling or bruising at the site, or you develop a fever > 101? F after you are discharged home, call the office at once.  Special Instructions   You are still able to use cellular telephones; use the ear opposite the side where you have your pacemaker/defibrillator.  Avoid carrying your cellular phone near your device.   When traveling through airports, show security personnel your identification card to avoid being screened in the metal detectors.  Ask the security personnel to use the hand wand.   Avoid arc welding equipment, MRI testing (magnetic resonance imaging), TENS units (transcutaneous nerve stimulators).  Call the office for questions about other devices.   Avoid electrical appliances that are in poor condition or are not properly grounded.   Microwave ovens are safe to be near or to operate.  Additional information for defibrillator patients should your device go off:   If your device goes off ONCE and you feel fine afterward, notify the device clinic nurses.   If your device goes off ONCE and you do not feel well afterward, call 911.   If  your device goes off TWICE, call 911.   If your device goes off THREE times in one day, call 911.  DO NOT DRIVE YOURSELF OR A FAMILY MEMBER WITH A DEFIBRILLATOR TO THE HOSPITAL--CALL 911. [facility] [phone], hhrn, hhpt eval

## 2013-11-13 ENCOUNTER — Ambulatory Visit (INDEPENDENT_AMBULATORY_CARE_PROVIDER_SITE_OTHER): Payer: Medicare Other | Admitting: *Deleted

## 2013-11-13 DIAGNOSIS — R001 Bradycardia, unspecified: Secondary | ICD-10-CM

## 2013-11-13 DIAGNOSIS — I498 Other specified cardiac arrhythmias: Secondary | ICD-10-CM

## 2013-11-13 DIAGNOSIS — I442 Atrioventricular block, complete: Secondary | ICD-10-CM

## 2013-11-17 NOTE — Progress Notes (Signed)
Wound check appointment. Steri-strips removed. Wound without redness or edema. Incision edges approximated, wound well healed. Normal device function. Thresholds, sensing, and impedances consistent with implant measurements. Auto capture programmed on. Histogram distribution appropriate for patient and level of activity. No mode switches or high ventricular rates noted. Patient educated about wound care, arm mobility, lifting restrictions. ROV on 8-6 with GT.

## 2013-11-20 ENCOUNTER — Other Ambulatory Visit: Payer: Self-pay | Admitting: Oncology

## 2013-11-20 ENCOUNTER — Ambulatory Visit (HOSPITAL_BASED_OUTPATIENT_CLINIC_OR_DEPARTMENT_OTHER): Payer: Medicare Other

## 2013-11-20 ENCOUNTER — Telehealth: Payer: Self-pay | Admitting: Oncology

## 2013-11-20 ENCOUNTER — Other Ambulatory Visit (HOSPITAL_BASED_OUTPATIENT_CLINIC_OR_DEPARTMENT_OTHER): Payer: Medicare Other

## 2013-11-20 VITALS — BP 118/53 | HR 76 | Temp 97.6°F

## 2013-11-20 DIAGNOSIS — N039 Chronic nephritic syndrome with unspecified morphologic changes: Secondary | ICD-10-CM

## 2013-11-20 DIAGNOSIS — D638 Anemia in other chronic diseases classified elsewhere: Secondary | ICD-10-CM

## 2013-11-20 DIAGNOSIS — N189 Chronic kidney disease, unspecified: Secondary | ICD-10-CM

## 2013-11-20 DIAGNOSIS — D631 Anemia in chronic kidney disease: Secondary | ICD-10-CM

## 2013-11-20 LAB — CBC WITH DIFFERENTIAL/PLATELET
BASO%: 1.5 % (ref 0.0–2.0)
Basophils Absolute: 0.1 10*3/uL (ref 0.0–0.1)
EOS%: 2.1 % (ref 0.0–7.0)
Eosinophils Absolute: 0.1 10*3/uL (ref 0.0–0.5)
HCT: 23.4 % — ABNORMAL LOW (ref 38.4–49.9)
HGB: 7.8 g/dL — ABNORMAL LOW (ref 13.0–17.1)
LYMPH#: 0.9 10*3/uL (ref 0.9–3.3)
LYMPH%: 24.2 % (ref 14.0–49.0)
MCH: 36.4 pg — ABNORMAL HIGH (ref 27.2–33.4)
MCHC: 33.4 g/dL (ref 32.0–36.0)
MCV: 109 fL — ABNORMAL HIGH (ref 79.3–98.0)
MONO#: 0.3 10*3/uL (ref 0.1–0.9)
MONO%: 7.8 % (ref 0.0–14.0)
NEUT%: 64.4 % (ref 39.0–75.0)
NEUTROS ABS: 2.3 10*3/uL (ref 1.5–6.5)
PLATELETS: 67 10*3/uL — AB (ref 140–400)
RBC: 2.15 10*6/uL — AB (ref 4.20–5.82)
RDW: 24.1 % — ABNORMAL HIGH (ref 11.0–14.6)
WBC: 3.5 10*3/uL — AB (ref 4.0–10.3)

## 2013-11-20 MED ORDER — EPOETIN ALFA 40000 UNIT/ML IJ SOLN
40000.0000 [IU] | Freq: Once | INTRAMUSCULAR | Status: AC
  Administered 2013-11-20: 40000 [IU] via SUBCUTANEOUS
  Filled 2013-11-20: qty 1

## 2013-11-20 NOTE — Progress Notes (Signed)
HGB 7.8 today.  Is not having any problems.  No SOB or extreme weakness.  Had a pacemaker placed since his last appointment here.  Feels good.    Spoke with Dr Jana Hakim who suggested that he come back in two weeks to get another injection with labs.  Gad is agreeable with this and he knows to call before then if he had any problems.

## 2013-11-20 NOTE — Progress Notes (Unsigned)
Mr. Penning had a pacemaker placed and his hemoglobin is a bit lower than usual. He is totally asymptomatic, "feels great". I am going to give her Procrit every 2 weeks for the next 2 doses, then go back to every 3 weeks thereafter as before.

## 2013-11-20 NOTE — Telephone Encounter (Signed)
PT SENT TO SCHEDULING BY INJ NURSE TO ADJUST APPTS. PER 5/14 POF CXD 6/4 AND ADDED LB/INJ FOR 5/28 AND 6/11. PER POF OTHER APPTS AFTER THIS TO REMAIN AS SCHEDULED. CCONFIRMED DATES W/INJ NURSE.

## 2013-11-25 ENCOUNTER — Encounter: Payer: Self-pay | Admitting: Internal Medicine

## 2013-12-04 ENCOUNTER — Other Ambulatory Visit (HOSPITAL_BASED_OUTPATIENT_CLINIC_OR_DEPARTMENT_OTHER): Payer: Medicare Other

## 2013-12-04 ENCOUNTER — Ambulatory Visit (HOSPITAL_BASED_OUTPATIENT_CLINIC_OR_DEPARTMENT_OTHER): Payer: Medicare Other

## 2013-12-04 VITALS — BP 136/52 | HR 74 | Temp 97.9°F

## 2013-12-04 DIAGNOSIS — D638 Anemia in other chronic diseases classified elsewhere: Secondary | ICD-10-CM

## 2013-12-04 DIAGNOSIS — D631 Anemia in chronic kidney disease: Secondary | ICD-10-CM

## 2013-12-04 DIAGNOSIS — N189 Chronic kidney disease, unspecified: Secondary | ICD-10-CM

## 2013-12-04 DIAGNOSIS — N039 Chronic nephritic syndrome with unspecified morphologic changes: Secondary | ICD-10-CM

## 2013-12-04 LAB — CBC WITH DIFFERENTIAL/PLATELET
BASO%: 0.5 % (ref 0.0–2.0)
BASOS ABS: 0 10*3/uL (ref 0.0–0.1)
EOS ABS: 0.1 10*3/uL (ref 0.0–0.5)
EOS%: 1.3 % (ref 0.0–7.0)
HEMATOCRIT: 24 % — AB (ref 38.4–49.9)
HEMOGLOBIN: 8.1 g/dL — AB (ref 13.0–17.1)
LYMPH%: 28 % (ref 14.0–49.0)
MCH: 34.8 pg — ABNORMAL HIGH (ref 27.2–33.4)
MCHC: 33.8 g/dL (ref 32.0–36.0)
MCV: 103 fL — ABNORMAL HIGH (ref 79.3–98.0)
MONO#: 0.3 10*3/uL (ref 0.1–0.9)
MONO%: 7.3 % (ref 0.0–14.0)
NEUT#: 2.4 10*3/uL (ref 1.5–6.5)
NEUT%: 62.9 % (ref 39.0–75.0)
PLATELETS: 60 10*3/uL — AB (ref 140–400)
RBC: 2.33 10*6/uL — ABNORMAL LOW (ref 4.20–5.82)
RDW: 23 % — ABNORMAL HIGH (ref 11.0–14.6)
WBC: 3.9 10*3/uL — ABNORMAL LOW (ref 4.0–10.3)
lymph#: 1.1 10*3/uL (ref 0.9–3.3)
nRBC: 0 % (ref 0–0)

## 2013-12-04 LAB — FERRITIN CHCC: Ferritin: 1049 ng/ml — ABNORMAL HIGH (ref 22–316)

## 2013-12-04 MED ORDER — EPOETIN ALFA 40000 UNIT/ML IJ SOLN
40000.0000 [IU] | Freq: Once | INTRAMUSCULAR | Status: AC
  Administered 2013-12-04: 40000 [IU] via SUBCUTANEOUS
  Filled 2013-12-04: qty 1

## 2013-12-04 NOTE — Patient Instructions (Signed)
Epoetin Alfa injection What is this medicine? EPOETIN ALFA (e POE e tin AL fa) helps your body make more red blood cells. This medicine is used to treat anemia caused by chronic kidney failure, cancer chemotherapy, or HIV-therapy. It may also be used before surgery if you have anemia. This medicine may be used for other purposes; ask your health care provider or pharmacist if you have questions. COMMON BRAND NAME(S): Epogen, Procrit What should I tell my health care provider before I take this medicine? They need to know if you have any of these conditions: -blood clotting disorders -cancer patient not on chemotherapy -cystic fibrosis -heart disease, such as angina or heart failure -hemoglobin level of 12 g/dL or greater -high blood pressure -low levels of folate, iron, or vitamin B12 -seizures -an unusual or allergic reaction to erythropoietin, albumin, benzyl alcohol, hamster proteins, other medicines, foods, dyes, or preservatives -pregnant or trying to get pregnant -breast-feeding How should I use this medicine? This medicine is for injection into a vein or under the skin. It is usually given by a health care professional in a hospital or clinic setting. If you get this medicine at home, you will be taught how to prepare and give this medicine. Use exactly as directed. Take your medicine at regular intervals. Do not take your medicine more often than directed. It is important that you put your used needles and syringes in a special sharps container. Do not put them in a trash can. If you do not have a sharps container, call your pharmacist or healthcare provider to get one. Talk to your pediatrician regarding the use of this medicine in children. While this drug may be prescribed for selected conditions, precautions do apply. Overdosage: If you think you have taken too much of this medicine contact a poison control center or emergency room at once. NOTE: This medicine is only for you. Do  not share this medicine with others. What if I miss a dose? If you miss a dose, take it as soon as you can. If it is almost time for your next dose, take only that dose. Do not take double or extra doses. What may interact with this medicine? Do not take this medicine with any of the following medications: -darbepoetin alfa This list may not describe all possible interactions. Give your health care provider a list of all the medicines, herbs, non-prescription drugs, or dietary supplements you use. Also tell them if you smoke, drink alcohol, or use illegal drugs. Some items may interact with your medicine. What should I watch for while using this medicine? Visit your prescriber or health care professional for regular checks on your progress and for the needed blood tests and blood pressure measurements. It is especially important for the doctor to make sure your hemoglobin level is in the desired range, to limit the risk of potential side effects and to give you the best benefit. Keep all appointments for any recommended tests. Check your blood pressure as directed. Ask your doctor what your blood pressure should be and when you should contact him or her. As your body makes more red blood cells, you may need to take iron, folic acid, or vitamin B supplements. Ask your doctor or health care provider which products are right for you. If you have kidney disease continue dietary restrictions, even though this medication can make you feel better. Talk with your doctor or health care professional about the foods you eat and the vitamins that you take. What   side effects may I notice from receiving this medicine? Side effects that you should report to your doctor or health care professional as soon as possible: -allergic reactions like skin rash, itching or hives, swelling of the face, lips, or tongue -breathing problems -changes in vision -chest pain -confusion, trouble speaking or understanding -feeling  faint or lightheaded, falls -high blood pressure -muscle aches or pains -pain, swelling, warmth in the leg -rapid weight gain -severe headaches -sudden numbness or weakness of the face, arm or leg -trouble walking, dizziness, loss of balance or coordination -seizures (convulsions) -swelling of the ankles, feet, hands -unusually weak or tired Side effects that usually do not require medical attention (report to your doctor or health care professional if they continue or are bothersome): -diarrhea -fever, chills (flu-like symptoms) -headaches -nausea, vomiting -redness, stinging, or swelling at site where injected This list may not describe all possible side effects. Call your doctor for medical advice about side effects. You may report side effects to FDA at 1-800-FDA-1088. Where should I keep my medicine? Keep out of the reach of children. Store in a refrigerator between 2 and 8 degrees C (36 and 46 degrees F). Do not freeze or shake. Throw away any unused portion if using a single-dose vial. Multi-dose vials can be kept in the refrigerator for up to 21 days after the initial dose. Throw away unused medicine. NOTE: This sheet is a summary. It may not cover all possible information. If you have questions about this medicine, talk to your doctor, pharmacist, or health care provider.  2014, Elsevier/Gold Standard. (2008-06-09 10:25:44)  

## 2013-12-06 ENCOUNTER — Other Ambulatory Visit: Payer: Self-pay | Admitting: Oncology

## 2013-12-11 ENCOUNTER — Ambulatory Visit: Payer: Medicare Other

## 2013-12-11 ENCOUNTER — Other Ambulatory Visit: Payer: Medicare Other

## 2013-12-18 ENCOUNTER — Other Ambulatory Visit (HOSPITAL_BASED_OUTPATIENT_CLINIC_OR_DEPARTMENT_OTHER): Payer: Medicare Other

## 2013-12-18 ENCOUNTER — Ambulatory Visit (HOSPITAL_BASED_OUTPATIENT_CLINIC_OR_DEPARTMENT_OTHER): Payer: Medicare Other

## 2013-12-18 VITALS — BP 132/56 | HR 80 | Temp 98.7°F

## 2013-12-18 DIAGNOSIS — N039 Chronic nephritic syndrome with unspecified morphologic changes: Secondary | ICD-10-CM

## 2013-12-18 DIAGNOSIS — N189 Chronic kidney disease, unspecified: Secondary | ICD-10-CM

## 2013-12-18 DIAGNOSIS — D638 Anemia in other chronic diseases classified elsewhere: Secondary | ICD-10-CM

## 2013-12-18 DIAGNOSIS — D631 Anemia in chronic kidney disease: Secondary | ICD-10-CM

## 2013-12-18 LAB — CBC WITH DIFFERENTIAL/PLATELET
BASO%: 1.5 % (ref 0.0–2.0)
BASOS ABS: 0.1 10*3/uL (ref 0.0–0.1)
EOS%: 2.3 % (ref 0.0–7.0)
Eosinophils Absolute: 0.2 10*3/uL (ref 0.0–0.5)
HCT: 25.1 % — ABNORMAL LOW (ref 38.4–49.9)
HEMOGLOBIN: 8.1 g/dL — AB (ref 13.0–17.1)
LYMPH%: 25.6 % (ref 14.0–49.0)
MCH: 36 pg — ABNORMAL HIGH (ref 27.2–33.4)
MCHC: 32.5 g/dL (ref 32.0–36.0)
MCV: 110.8 fL — AB (ref 79.3–98.0)
MONO#: 0.7 10*3/uL (ref 0.1–0.9)
MONO%: 10.7 % (ref 0.0–14.0)
NEUT#: 4.1 10*3/uL (ref 1.5–6.5)
NEUT%: 59.9 % (ref 39.0–75.0)
Platelets: 78 10*3/uL — ABNORMAL LOW (ref 140–400)
RBC: 2.26 10*6/uL — ABNORMAL LOW (ref 4.20–5.82)
RDW: 22.5 % — ABNORMAL HIGH (ref 11.0–14.6)
WBC: 6.8 10*3/uL (ref 4.0–10.3)
lymph#: 1.7 10*3/uL (ref 0.9–3.3)

## 2013-12-18 MED ORDER — EPOETIN ALFA 40000 UNIT/ML IJ SOLN
40000.0000 [IU] | Freq: Once | INTRAMUSCULAR | Status: AC
  Administered 2013-12-18: 40000 [IU] via SUBCUTANEOUS
  Filled 2013-12-18: qty 1

## 2013-12-19 ENCOUNTER — Telehealth: Payer: Self-pay | Admitting: Internal Medicine

## 2013-12-19 NOTE — Telephone Encounter (Signed)
Left message for Simona Huh to return my call

## 2013-12-19 NOTE — Telephone Encounter (Signed)
New Message  Simona Huh from [facility] called states the pt has had orders for home health care that have run out// requesting additional orders please assist..

## 2013-12-21 ENCOUNTER — Other Ambulatory Visit: Payer: Self-pay | Admitting: Hematology and Oncology

## 2014-01-01 ENCOUNTER — Other Ambulatory Visit (HOSPITAL_BASED_OUTPATIENT_CLINIC_OR_DEPARTMENT_OTHER): Payer: Medicare Other

## 2014-01-01 ENCOUNTER — Ambulatory Visit (HOSPITAL_BASED_OUTPATIENT_CLINIC_OR_DEPARTMENT_OTHER): Payer: Medicare Other

## 2014-01-01 VITALS — BP 144/74 | HR 92 | Temp 98.0°F

## 2014-01-01 DIAGNOSIS — D638 Anemia in other chronic diseases classified elsewhere: Secondary | ICD-10-CM

## 2014-01-01 DIAGNOSIS — N039 Chronic nephritic syndrome with unspecified morphologic changes: Principal | ICD-10-CM

## 2014-01-01 DIAGNOSIS — N189 Chronic kidney disease, unspecified: Secondary | ICD-10-CM

## 2014-01-01 DIAGNOSIS — D631 Anemia in chronic kidney disease: Secondary | ICD-10-CM

## 2014-01-01 LAB — CBC WITH DIFFERENTIAL/PLATELET
BASO%: 1.7 % (ref 0.0–2.0)
Basophils Absolute: 0.1 10*3/uL (ref 0.0–0.1)
EOS%: 2.3 % (ref 0.0–7.0)
Eosinophils Absolute: 0.1 10*3/uL (ref 0.0–0.5)
HEMATOCRIT: 26.6 % — AB (ref 38.4–49.9)
HGB: 8.6 g/dL — ABNORMAL LOW (ref 13.0–17.1)
LYMPH%: 23.9 % (ref 14.0–49.0)
MCH: 35.7 pg — ABNORMAL HIGH (ref 27.2–33.4)
MCHC: 32.3 g/dL (ref 32.0–36.0)
MCV: 110.6 fL — AB (ref 79.3–98.0)
MONO#: 0.4 10*3/uL (ref 0.1–0.9)
MONO%: 10.1 % (ref 0.0–14.0)
NEUT%: 62 % (ref 39.0–75.0)
NEUTROS ABS: 2.3 10*3/uL (ref 1.5–6.5)
Platelets: 72 10*3/uL — ABNORMAL LOW (ref 140–400)
RBC: 2.41 10*6/uL — AB (ref 4.20–5.82)
RDW: 21.3 % — ABNORMAL HIGH (ref 11.0–14.6)
WBC: 3.7 10*3/uL — ABNORMAL LOW (ref 4.0–10.3)
lymph#: 0.9 10*3/uL (ref 0.9–3.3)

## 2014-01-01 LAB — FERRITIN CHCC

## 2014-01-01 MED ORDER — EPOETIN ALFA 40000 UNIT/ML IJ SOLN
40000.0000 [IU] | Freq: Once | INTRAMUSCULAR | Status: AC
  Administered 2014-01-01: 40000 [IU] via SUBCUTANEOUS
  Filled 2014-01-01: qty 1

## 2014-01-05 ENCOUNTER — Ambulatory Visit (HOSPITAL_BASED_OUTPATIENT_CLINIC_OR_DEPARTMENT_OTHER): Payer: Medicare Other

## 2014-01-05 ENCOUNTER — Encounter: Payer: Self-pay | Admitting: Hematology and Oncology

## 2014-01-05 ENCOUNTER — Ambulatory Visit (HOSPITAL_BASED_OUTPATIENT_CLINIC_OR_DEPARTMENT_OTHER): Payer: Medicare Other | Admitting: Hematology and Oncology

## 2014-01-05 ENCOUNTER — Telehealth: Payer: Self-pay | Admitting: Hematology and Oncology

## 2014-01-05 VITALS — BP 135/59 | HR 91 | Temp 98.0°F | Resp 18 | Ht 68.0 in | Wt 154.0 lb

## 2014-01-05 DIAGNOSIS — N189 Chronic kidney disease, unspecified: Secondary | ICD-10-CM

## 2014-01-05 DIAGNOSIS — R5381 Other malaise: Secondary | ICD-10-CM

## 2014-01-05 DIAGNOSIS — N183 Chronic kidney disease, stage 3 unspecified: Secondary | ICD-10-CM

## 2014-01-05 DIAGNOSIS — D469 Myelodysplastic syndrome, unspecified: Secondary | ICD-10-CM | POA: Insufficient documentation

## 2014-01-05 DIAGNOSIS — R5383 Other fatigue: Secondary | ICD-10-CM

## 2014-01-05 DIAGNOSIS — D638 Anemia in other chronic diseases classified elsewhere: Secondary | ICD-10-CM

## 2014-01-05 DIAGNOSIS — D630 Anemia in neoplastic disease: Secondary | ICD-10-CM

## 2014-01-05 DIAGNOSIS — D696 Thrombocytopenia, unspecified: Secondary | ICD-10-CM

## 2014-01-05 DIAGNOSIS — I509 Heart failure, unspecified: Secondary | ICD-10-CM

## 2014-01-05 HISTORY — DX: Myelodysplastic syndrome, unspecified: D46.9

## 2014-01-05 LAB — CBC WITH DIFFERENTIAL/PLATELET
BASO%: 1.2 % (ref 0.0–2.0)
BASOS ABS: 0.1 10*3/uL (ref 0.0–0.1)
EOS%: 2.4 % (ref 0.0–7.0)
Eosinophils Absolute: 0.1 10*3/uL (ref 0.0–0.5)
HCT: 26.8 % — ABNORMAL LOW (ref 38.4–49.9)
HGB: 8.6 g/dL — ABNORMAL LOW (ref 13.0–17.1)
LYMPH#: 1.3 10*3/uL (ref 0.9–3.3)
LYMPH%: 23.6 % (ref 14.0–49.0)
MCH: 35.4 pg — AB (ref 27.2–33.4)
MCHC: 32.1 g/dL (ref 32.0–36.0)
MCV: 110.3 fL — ABNORMAL HIGH (ref 79.3–98.0)
MONO#: 0.5 10*3/uL (ref 0.1–0.9)
MONO%: 9.1 % (ref 0.0–14.0)
NEUT#: 3.5 10*3/uL (ref 1.5–6.5)
NEUT%: 63.7 % (ref 39.0–75.0)
PLATELETS: 143 10*3/uL (ref 140–400)
RBC: 2.43 10*6/uL — ABNORMAL LOW (ref 4.20–5.82)
RDW: 21.2 % — ABNORMAL HIGH (ref 11.0–14.6)
WBC: 5.6 10*3/uL (ref 4.0–10.3)

## 2014-01-05 LAB — COMPREHENSIVE METABOLIC PANEL (CC13)
ALK PHOS: 254 U/L — AB (ref 40–150)
ALT: 70 U/L — AB (ref 0–55)
AST: 64 U/L — ABNORMAL HIGH (ref 5–34)
Albumin: 3.7 g/dL (ref 3.5–5.0)
Anion Gap: 6 mEq/L (ref 3–11)
BUN: 25 mg/dL (ref 7.0–26.0)
CALCIUM: 9.3 mg/dL (ref 8.4–10.4)
CHLORIDE: 107 meq/L (ref 98–109)
CO2: 25 mEq/L (ref 22–29)
CREATININE: 1.7 mg/dL — AB (ref 0.7–1.3)
Glucose: 94 mg/dl (ref 70–140)
Potassium: 4.6 mEq/L (ref 3.5–5.1)
Sodium: 138 mEq/L (ref 136–145)
Total Bilirubin: 0.91 mg/dL (ref 0.20–1.20)
Total Protein: 7.1 g/dL (ref 6.4–8.3)

## 2014-01-05 LAB — CHCC SMEAR

## 2014-01-05 LAB — FIBRINOGEN: FIBRINOGEN: 237 mg/dL (ref 204–475)

## 2014-01-05 NOTE — Assessment & Plan Note (Signed)
This is chronic in nature. Monitor carefully.

## 2014-01-05 NOTE — Assessment & Plan Note (Signed)
Clinically, he is euvolemic. He has lost some weight from diuretic therapy. He will continue followup with his cardiologist.

## 2014-01-05 NOTE — Assessment & Plan Note (Signed)
This is likely anemia of chronic disease. The patient denies recent history of bleeding such as epistaxis, hematuria or hematochezia. He is asymptomatic from the anemia. We will observe for now.  He does not require transfusion now.  I will order an additional workup for this. His next injection is due on July 16 I will see him that day.

## 2014-01-05 NOTE — Assessment & Plan Note (Signed)
This could be due to consumption or reduced production. I will order an additional workup for this. His last vitamin B12 level in April 2015 was adequate. The patient has evidence of iron overload. Right now, he is not a life-threatening danger of bleeding risk. He is not symptomatic from bleeding. I will observe for now.

## 2014-01-05 NOTE — Telephone Encounter (Signed)
gv and printed aptp sched and avs for pt for July...sed sent to lab

## 2014-01-05 NOTE — Progress Notes (Signed)
[facility] FOLLOW-UP progress notes  Patient Care Team: Merrilee Seashore, MD as PCP - General (Internal Medicine)  CHIEF COMPLAINTS/PURPOSE OF VISIT:  Progressive thrombocytopenia on background history of myelodysplastic syndrome  HISTORY OF PRESENTING ILLNESS:  Anthony Hutchinson 80 y.o. male was transferred to my care after per request from his oncologist as a second opinion.  I reviewed the patient's records extensive and collaborated the history with the patient. Summary of his history is as follows: Oncology History   MDS, low IPSS score     MDS (myelodysplastic syndrome)   12/09/2004 Bone Marrow Biopsy Bone marrow biopsy showed HYPERCELLULAR MARROW WITH DYSPOIETIC FEATURES AND RING SIDEROBLASTS,  FAVOR MYELODYSPLASTIC SYNDROME   05/26/2011 - 01/01/2014 Chemotherapy The patient was given Procrit every 3 weeks 40,000 units for anemia.   He complained of mild fatigue. Denies any chest pain or shortness of breath. He has pacemaker implantation 2 months ago and appears to be doing well. He had lost about 12 pounds of fluid weight. Recently, he was placed on antibiotic due to pressure sore in his leg. He is improving. He has occasional hemorrhoidal bleeding but denies constipation. The patient denies any recent signs or symptoms of bleeding such as spontaneous epistaxis, hematuria or hematochezia.  MEDICAL HISTORY:  Past Medical History  Diagnosis Date  . Hypertension   . LBBB (left bundle branch block)   . Diabetes mellitus without complication   . Cancer   . Hypothyroidism     nodule  . Cataracts, bilateral   . Anemia     iron deficiency anemia  . Inguinal hernia   . Peripheral vascular disease     Peripheral neuropathy  . Complete heart block   . MDS (myelodysplastic syndrome) 01/05/2014    SURGICAL HISTORY: Past Surgical History  Procedure Laterality Date  . Tonsillectomy    . Hiatal hernia repair    . Hernia repair  1979    bilateral inguinal hernia   . Thyroid lobectomy  6/85    right  . Acromionectomy  1998    rotator cuff repair  . Prostate surgery    . Abdominal aortic aneurysm repair    . Tonsillectomy    . Flexible sigmoidoscopy N/A 12/13/2012    Procedure: FLEXIBLE SIGMOIDOSCOPY;  Surgeon: Beryle Beams, MD;  Location: [facility];  Service: Endoscopy;  Laterality: N/A;  . Hot hemostasis N/A 12/13/2012    Procedure: HOT HEMOSTASIS (ARGON PLASMA COAGULATION/BICAP);  Surgeon: Beryle Beams, MD;  Location: [facility];  Service: Endoscopy;  Laterality: N/A;  . Pacemaker insertion  11-03-13    STJ dual chamber pacemaker implanted by Dr Lovena Le for CHB    SOCIAL HISTORY: History   Social History  . Marital Status: Married    Spouse Name: N/A    Number of Children: N/A  . Years of Education: N/A   Occupational History  . Not on file.   Social History Main Topics  . Smoking status: Former Smoker    Types: Pipe    Quit date: 11/27/1963  . Smokeless tobacco: Never Used  . Alcohol Use: No  . Drug Use: No  . Sexual Activity: Not on file   Other Topics Concern  . Not on file   Social History Narrative  . No narrative on file    FAMILY HISTORY: Family History  Problem Relation Age of Onset  . Heart disease Mother   . Heart disease Father     ALLERGIES:  is allergic to penicillins and codeine  sulfate.  MEDICATIONS:  Current Outpatient Prescriptions  Medication Sig Dispense Refill  . alfuzosin (UROXATRAL) 10 MG 24 hr tablet Take 10 mg by mouth every evening.       Marland Kitchen epoetin alfa (EPOGEN,PROCRIT) 27253 UNIT/ML injection Inject 40,000 Units into the skin every 21 ( twenty-one) days.      . furosemide (LASIX) 20 MG tablet Take 20 mg by mouth daily.      Marland Kitchen levothyroxine (SYNTHROID, LEVOTHROID) 137 MCG tablet Take 1 tablet (137 mcg total) by mouth daily before breakfast.  30 tablet  11  . lisinopril (PRINIVIL,ZESTRIL) 10 MG tablet Take 10 mg by mouth every morning.      . Multiple Vitamin (MULTIVITAMIN WITH MINERALS)  TABS Take 1 tablet by mouth every morning.      . TOPROL XL 25 MG 24 hr tablet Take 25 mg by mouth daily.       No current facility-administered medications for this visit.    REVIEW OF SYSTEMS:   Constitutional: Denies fevers, chills or abnormal night sweats Eyes: Denies blurriness of vision, double vision or watery eyes Ears, nose, mouth, throat, and face: Denies mucositis or sore throat Respiratory: Denies cough, dyspnea or wheezes Cardiovascular: Denies palpitation, chest discomfort or lower extremity swelling Gastrointestinal:  Denies nausea, heartburn or change in bowel habits Skin: Denies abnormal skin rashes apart from bruising Lymphatics: Denies new lymphadenopathy or easy bruising Neurological:Denies numbness, tingling or new weaknesses Behavioral/Psych: Mood is stable, no new changes  All other systems were reviewed with the patient and are negative.  PHYSICAL EXAMINATION: ECOG PERFORMANCE STATUS: 2 - Symptomatic, <50% confined to bed  Filed Vitals:   01/05/14 1109  BP: 135/59  Pulse: 91  Temp: 98 F (36.7 C)  Resp: 18   Filed Weights   01/05/14 1109  Weight: 154 lb (69.854 kg)    GENERAL:alert, no distress and comfortable. He looks thin but not cachectic. He looks elderly SKIN: skin color is pale, texture, turgor are normal, no rashes or significant lesions upon from some mild bruising but no petechiae rash EYES: normal, conjunctiva are pale and non-injected, sclera clear OROPHARYNX:no exudate, normal lips, buccal mucosa, and tongue  NECK: supple, well-healed thyroidectomy scar LYMPH:  no palpable lymphadenopathy in the cervical, axillary or inguinal LUNGS: clear to auscultation and percussion with normal breathing effort HEART: regular rate & rhythm and no murmurs without lower extremity edema. Pacemaker implantation site looks okay ABDOMEN:abdomen soft, non-tender and normal bowel sounds Musculoskeletal:no cyanosis of digits and no clubbing  PSYCH: alert &  oriented x 3 with fluent speech NEURO: no focal motor/sensory deficits  LABORATORY DATA:  I have reviewed the data as listed Lab Results  Component Value Date   WBC 3.7* 01/01/2014   HGB 8.6* 01/01/2014   HCT 26.6* 01/01/2014   MCV 110.6* 01/01/2014   PLT 72* 01/01/2014    Recent Labs  10/31/13 1245  11/02/13 0333 11/03/13 0340 11/04/13 0247  NA 138  < > 139 136* 136*  K 5.0  < > 4.8 4.8 4.7  CL 106  < > 108 106 105  CO2 17*  < > 16* 18* 20  GLUCOSE 124*  < > 126* 122* 119*  BUN 63*  < > 54* 45* 38*  CREATININE 2.53*  < > 2.16* 1.81* 1.64*  CALCIUM 9.1  < > 8.2* 8.4 8.1*  GFRNONAA 21*  < > 25* 31* 35*  GFRAA 24*  < > 29* 36* 41*  PROT 7.2  --   --   --   --  ALBUMIN 3.5  --   --   --   --   AST 58*  --   --   --   --   ALT 60*  --   --   --   --   ALKPHOS 248*  --   --   --   --   BILITOT 1.2  --   --   --   --   < > = values in this interval not displayed.  ASSESSMENT & PLAN:  MDS (myelodysplastic syndrome) I discussed with the patient and his son plan of care. He is responding to erythropoietin stimulating agents, either related to progression of disease, development of antibodies or inadequate dosage. I recommend repeat additional workup today and see him back in 2 weeks to review test results. He may need repeat bone marrow aspirate and biopsy to exclude disease progression.  Thrombocytopenia This could be due to consumption or reduced production. I will order an additional workup for this. His last vitamin B12 level in April 2015 was adequate. The patient has evidence of iron overload. Right now, he is not a life-threatening danger of bleeding risk. He is not symptomatic from bleeding. I will observe for now.  Anemia in neoplastic disease This is likely anemia of chronic disease. The patient denies recent history of bleeding such as epistaxis, hematuria or hematochezia. He is asymptomatic from the anemia. We will observe for now.  He does not require transfusion  now.  I will order an additional workup for this. His next injection is due on July 16 I will see him that day.   Acute CHF (congestive heart failure) Clinically, he is euvolemic. He has lost some weight from diuretic therapy. He will continue followup with his cardiologist.  Chronic kidney disease This is chronic in nature. Monitor carefully.     Orders Placed This Encounter  Procedures  . CBC with Differential    Standing Status: Future     Number of Occurrences:      Standing Expiration Date: 01/05/2015  . Comprehensive metabolic panel    Standing Status: Future     Number of Occurrences:      Standing Expiration Date: 01/05/2015  . Erythropoietin    Standing Status: Future     Number of Occurrences:      Standing Expiration Date: 01/05/2015  . Smear    Standing Status: Future     Number of Occurrences:      Standing Expiration Date: 01/05/2015  . Sedimentation rate    Standing Status: Future     Number of Occurrences:      Standing Expiration Date: 01/05/2015  . SPEP & IFE with QIG    Standing Status: Future     Number of Occurrences:      Standing Expiration Date: 01/05/2015  . Kappa/lambda light chains    Standing Status: Future     Number of Occurrences:      Standing Expiration Date: 01/05/2015  . Fibrinogen    Standing Status: Future     Number of Occurrences:      Standing Expiration Date: 01/05/2015    All questions were answered. The patient knows to call the clinic with any problems, questions or concerns. I spent 40 minutes counseling the patient face to face. The total time spent in the appointment was 55 minutes and more than 50% was on counseling.     [place], [place], MD 01/05/2014 11:54 AM

## 2014-01-05 NOTE — Assessment & Plan Note (Signed)
I discussed with the patient and his son plan of care. He is responding to erythropoietin stimulating agents, either related to progression of disease, development of antibodies or inadequate dosage. I recommend repeat additional workup today and see him back in 2 weeks to review test results. He may need repeat bone marrow aspirate and biopsy to exclude disease progression.

## 2014-01-08 LAB — SPEP & IFE WITH QIG
ALPHA-1-GLOBULIN: 3.9 % (ref 2.9–4.9)
Albumin ELP: 56.3 % (ref 55.8–66.1)
Alpha-2-Globulin: 7.3 % (ref 7.1–11.8)
BETA 2: 4.2 % (ref 3.2–6.5)
Beta Globulin: 4.1 % — ABNORMAL LOW (ref 4.7–7.2)
Gamma Globulin: 24.2 % — ABNORMAL HIGH (ref 11.1–18.8)
IgA: 344 mg/dL (ref 68–379)
IgG (Immunoglobin G), Serum: 1680 mg/dL — ABNORMAL HIGH (ref 650–1600)
IgM, Serum: 244 mg/dL (ref 41–251)
Total Protein, Serum Electrophoresis: 7 g/dL (ref 6.0–8.3)

## 2014-01-08 LAB — ERYTHROPOIETIN: Erythropoietin: 30.8 m[IU]/mL — ABNORMAL HIGH (ref 2.6–18.5)

## 2014-01-08 LAB — KAPPA/LAMBDA LIGHT CHAINS
Kappa free light chain: 9.44 mg/dL — ABNORMAL HIGH (ref 0.33–1.94)
Kappa:Lambda Ratio: 1.68 — ABNORMAL HIGH (ref 0.26–1.65)
Lambda Free Lght Chn: 5.62 mg/dL — ABNORMAL HIGH (ref 0.57–2.63)

## 2014-01-08 LAB — SEDIMENTATION RATE: Sed Rate: 18 mm/hr — ABNORMAL HIGH (ref 0–16)

## 2014-01-22 ENCOUNTER — Ambulatory Visit (HOSPITAL_BASED_OUTPATIENT_CLINIC_OR_DEPARTMENT_OTHER): Payer: Medicare Other

## 2014-01-22 ENCOUNTER — Other Ambulatory Visit: Payer: Medicare Other

## 2014-01-22 ENCOUNTER — Ambulatory Visit (HOSPITAL_BASED_OUTPATIENT_CLINIC_OR_DEPARTMENT_OTHER): Payer: Medicare Other | Admitting: Hematology and Oncology

## 2014-01-22 ENCOUNTER — Encounter: Payer: Self-pay | Admitting: Hematology and Oncology

## 2014-01-22 VITALS — BP 122/74 | HR 67 | Temp 97.8°F | Resp 18 | Ht 68.0 in | Wt 157.6 lb

## 2014-01-22 DIAGNOSIS — D469 Myelodysplastic syndrome, unspecified: Secondary | ICD-10-CM

## 2014-01-22 DIAGNOSIS — N189 Chronic kidney disease, unspecified: Secondary | ICD-10-CM

## 2014-01-22 DIAGNOSIS — D631 Anemia in chronic kidney disease: Secondary | ICD-10-CM

## 2014-01-22 DIAGNOSIS — N183 Chronic kidney disease, stage 3 unspecified: Secondary | ICD-10-CM

## 2014-01-22 DIAGNOSIS — D630 Anemia in neoplastic disease: Secondary | ICD-10-CM

## 2014-01-22 DIAGNOSIS — D649 Anemia, unspecified: Secondary | ICD-10-CM

## 2014-01-22 DIAGNOSIS — I509 Heart failure, unspecified: Secondary | ICD-10-CM

## 2014-01-22 MED ORDER — DARBEPOETIN ALFA-POLYSORBATE 300 MCG/0.6ML IJ SOLN
300.0000 ug | Freq: Once | INTRAMUSCULAR | Status: AC
  Administered 2014-01-22: 300 ug via SUBCUTANEOUS
  Filled 2014-01-22: qty 0.6

## 2014-01-22 NOTE — Progress Notes (Signed)
[facility] OFFICE PROGRESS NOTE  Patient Care Team: Ajith Ramachandran, MD as PCP - General (Internal Medicine)  SUMMARY OF ONCOLOGIC HISTORY: Oncology History   MDS, low IPSS score     MDS (myelodysplastic syndrome)   12/09/2004 Bone Marrow Biopsy Bone marrow biopsy showed HYPERCELLULAR MARROW WITH DYSPOIETIC FEATURES AND RING SIDEROBLASTS,  FAVOR MYELODYSPLASTIC SYNDROME   05/26/2011 - 01/01/2014 Chemotherapy The patient was given Procrit every 3 weeks 40,000 units for anemia.    INTERVAL HISTORY: Please see below for problem oriented charting. Since the last time I saw him, the wound on his leg has completely healed. He denies shortness of breath. He has no further weight loss. He denies symptoms of anemia apart from fatigue. No chest pain, dizziness or shortness of breath.  REVIEW OF SYSTEMS:   Constitutional: Denies fevers, chills or abnormal weight loss Eyes: Denies blurriness of vision Ears, nose, mouth, throat, and face: Denies mucositis or sore throat Respiratory: Denies cough, dyspnea or wheezes Cardiovascular: Denies palpitation, chest discomfort or lower extremity swelling Gastrointestinal:  Denies nausea, heartburn or change in bowel habits Skin: Denies abnormal skin rashes Lymphatics: Denies new lymphadenopathy or easy bruising Neurological:Denies numbness, tingling or new weaknesses Behavioral/Psych: Mood is stable, no new changes  All other systems were reviewed with the patient and are negative.  I have reviewed the past medical history, past surgical history, social history and family history with the patient and they are unchanged from previous note.  ALLERGIES:  is allergic to penicillins and codeine sulfate.  MEDICATIONS:  Current Outpatient Prescriptions  Medication Sig Dispense Refill  . alfuzosin (UROXATRAL) 10 MG 24 hr tablet Take 10 mg by mouth every evening.       . epoetin alfa (EPOGEN,PROCRIT) 40000 UNIT/ML injection Inject 40,000  Units into the skin every 21 ( twenty-one) days.      . furosemide (LASIX) 20 MG tablet Take 20 mg by mouth daily.      . levothyroxine (SYNTHROID, LEVOTHROID) 137 MCG tablet Take 1 tablet (137 mcg total) by mouth daily before breakfast.  30 tablet  11  . lisinopril (PRINIVIL,ZESTRIL) 10 MG tablet Take 10 mg by mouth every morning.      . Multiple Vitamin (MULTIVITAMIN WITH MINERALS) TABS Take 1 tablet by mouth every morning.      . TOPROL XL 25 MG 24 hr tablet Take 25 mg by mouth daily.       No current facility-administered medications for this visit.    PHYSICAL EXAMINATION: ECOG PERFORMANCE STATUS: 1 - Symptomatic but completely ambulatory  Filed Vitals:   01/22/14 0941  BP: 122/74  Pulse: 67  Temp: 97.8 F (36.6 C)  Resp: 18   Filed Weights   01/22/14 0941  Weight: 157 lb 9.6 oz (71.487 kg)    GENERAL:alert, no distress and comfortable SKIN: skin color is pale, texture, turgor are normal, no rashes or significant lesions EYES: normal, Conjunctiva are pale and non-injected, sclera clear Musculoskeletal:no cyanosis of digits and no clubbing  NEURO: alert & oriented x 3 with fluent speech, no focal motor/sensory deficits  LABORATORY DATA:  I have reviewed the data as listed    Component Value Date/Time   NA 138 01/05/2014 1159   NA 136* 11/04/2013 0247   K 4.6 01/05/2014 1159   K 4.7 11/04/2013 0247   CL 105 11/04/2013 0247   CO2 25 01/05/2014 1159   CO2 20 11/04/2013 0247   GLUCOSE 94 01/05/2014 1159   GLUCOSE 119* 11/04/2013 0247     BUN 25.0 01/05/2014 1159   BUN 38* 11/04/2013 0247   CREATINE 1.7* 01/05/2014 1159   CREATINE 1.64* 11/04/2013 0247   CALCIUM 9.3 01/05/2014 1159   CALCIUM 8.1* 11/04/2013 0247   PROT 7.1 01/05/2014 1159   PROT 7.2 10/31/2013 1245   ALBUMIN 3.7 01/05/2014 1159   ALBUMIN 3.5 10/31/2013 1245   AST 64* 01/05/2014 1159   AST 58* 10/31/2013 1245   ALT 70* 01/05/2014 1159   ALT 60* 10/31/2013 1245   ALKPHOS 254* 01/05/2014 1159   ALKPHOS 248*  10/31/2013 1245   BILITOT 0.91 01/05/2014 1159   BILITOT 1.2 10/31/2013 1245   GFRNONAA 35* 11/04/2013 0247   GFRAA 41* 11/04/2013 0247    No results found for this basename: SPEP, UPEP,  kappa and lambda light chains    Lab Results  Component Value Date   WBC 5.6 01/05/2014   NEUTROABS 3.5 01/05/2014   HGB 8.6* 01/05/2014   HCT 26.8* 01/05/2014   MCV 110.3* 01/05/2014   PLT 143 01/05/2014      Chemistry      Component Value Date/Time   NA 138 01/05/2014 1159   NA 136* 11/04/2013 0247   K 4.6 01/05/2014 1159   K 4.7 11/04/2013 0247   CL 105 11/04/2013 0247   CO2 25 01/05/2014 1159   CO2 20 11/04/2013 0247   BUN 25.0 01/05/2014 1159   BUN 38* 11/04/2013 0247   CREATINE 1.7* 01/05/2014 1159   CREATINE 1.64* 11/04/2013 0247      Component Value Date/Time   CALCIUM 9.3 01/05/2014 1159   CALCIUM 8.1* 11/04/2013 0247   ALKPHOS 254* 01/05/2014 1159   ALKPHOS 248* 10/31/2013 1245   AST 64* 01/05/2014 1159   AST 58* 10/31/2013 1245   ALT 70* 01/05/2014 1159   ALT 60* 10/31/2013 1245   BILITOT 0.91 01/05/2014 1159   BILITOT 1.2 10/31/2013 1245       ASSESSMENT & PLAN:  MDS (myelodysplastic syndrome) Additional workup suggested that the patient has ineffective erythropoiesis from Procrit. There is a possibility that he might have developed some antibodies against Procrit. I recommend a trial of darbopoetin every 3 weeks at 300 mcg. If he has minimum response, we can increase it to 500 mcg every 3 weeks or to shorten the duration to every 2 weeks. Given his multiple comorbidities, he may have an element of anemia chronic disease. At this point in time, I am hesitant to draw a bone marrow aspirate and biopsy.  Chronic kidney disease This is stable while on diuretics.  Acute CHF (congestive heart failure) Clinically, he is euvolemic.   Orders Placed This Encounter  Procedures  . CBC & Diff and Retic    Standing Status: Standing     Number of Occurrences: 33     Standing Expiration Date:  01/23/2015   All questions were answered. The patient knows to call the clinic with any problems, questions or concerns. No barriers to learning was detected. I spent 25 minutes counseling the patient face to face. The total time spent in the appointment was 30 minutes and more than 50% was on counseling and review of test results     , , MD 01/22/2014 11:02 AM    

## 2014-01-22 NOTE — Assessment & Plan Note (Signed)
Clinically, he is euvolemic.

## 2014-01-22 NOTE — Patient Instructions (Signed)
Darbepoetin Alfa injection What is this medicine? DARBEPOETIN ALFA (dar be POE e tin AL fa) helps your body make more red blood cells. It is used to treat anemia caused by chronic kidney failure and chemotherapy. This medicine may be used for other purposes; ask your health care provider or pharmacist if you have questions. COMMON BRAND NAME(S): Aranesp What should I tell my health care provider before I take this medicine? They need to know if you have any of these conditions: -blood clotting disorders or history of blood clots -cancer patient not on chemotherapy -cystic fibrosis -heart disease, such as angina, heart failure, or a history of a heart attack -hemoglobin level of 12 g/dL or greater -high blood pressure -low levels of folate, iron, or vitamin B12 -seizures -an unusual or allergic reaction to darbepoetin, erythropoietin, albumin, hamster proteins, latex, other medicines, foods, dyes, or preservatives -pregnant or trying to get pregnant -breast-feeding How should I use this medicine? This medicine is for injection into a vein or under the skin. It is usually given by a health care professional in a hospital or clinic setting. If you get this medicine at home, you will be taught how to prepare and give this medicine. Do not shake the solution before you withdraw a dose. Use exactly as directed. Take your medicine at regular intervals. Do not take your medicine more often than directed. It is important that you put your used needles and syringes in a special sharps container. Do not put them in a trash can. If you do not have a sharps container, call your pharmacist or healthcare provider to get one. Talk to your pediatrician regarding the use of this medicine in children. While this medicine may be used in children as young as 1 year for selected conditions, precautions do apply. Overdosage: If you think you have taken too much of this medicine contact a poison control center or  emergency room at once. NOTE: This medicine is only for you. Do not share this medicine with others. What if I miss a dose? If you miss a dose, take it as soon as you can. If it is almost time for your next dose, take only that dose. Do not take double or extra doses. What may interact with this medicine? Do not take this medicine with any of the following medications: -epoetin alfa This list may not describe all possible interactions. Give your health care provider a list of all the medicines, herbs, non-prescription drugs, or dietary supplements you use. Also tell them if you smoke, drink alcohol, or use illegal drugs. Some items may interact with your medicine. What should I watch for while using this medicine? Visit your prescriber or health care professional for regular checks on your progress and for the needed blood tests and blood pressure measurements. It is especially important for the doctor to make sure your hemoglobin level is in the desired range, to limit the risk of potential side effects and to give you the best benefit. Keep all appointments for any recommended tests. Check your blood pressure as directed. Ask your doctor what your blood pressure should be and when you should contact him or her. As your body makes more red blood cells, you may need to take iron, folic acid, or vitamin B supplements. Ask your doctor or health care provider which products are right for you. If you have kidney disease continue dietary restrictions, even though this medication can make you feel better. Talk with your doctor or health   care professional about the foods you eat and the vitamins that you take. What side effects may I notice from receiving this medicine? Side effects that you should report to your doctor or health care professional as soon as possible: -allergic reactions like skin rash, itching or hives, swelling of the face, lips, or tongue -breathing problems -changes in vision -chest  pain -confusion, trouble speaking or understanding -feeling faint or lightheaded, falls -high blood pressure -muscle aches or pains -pain, swelling, warmth in the leg -rapid weight gain -severe headaches -sudden numbness or weakness of the face, arm or leg -trouble walking, dizziness, loss of balance or coordination -seizures (convulsions) -swelling of the ankles, feet, hands -unusually weak or tired Side effects that usually do not require medical attention (report to your doctor or health care professional if they continue or are bothersome): -diarrhea -fever, chills (flu-like symptoms) -headaches -nausea, vomiting -redness, stinging, or swelling at site where injected This list may not describe all possible side effects. Call your doctor for medical advice about side effects. You may report side effects to FDA at 1-800-FDA-1088. Where should I keep my medicine? Keep out of the reach of children. Store in a refrigerator between 2 and 8 degrees C (36 and 46 degrees F). Do not freeze. Do not shake. Throw away any unused portion if using a single-dose vial. Throw away any unused medicine after the expiration date. NOTE: This sheet is a summary. It may not cover all possible information. If you have questions about this medicine, talk to your doctor, pharmacist, or health care provider.  2015, [employer]vier/[employer] Standard. (2008-06-09 10:23:57)  

## 2014-01-22 NOTE — Assessment & Plan Note (Signed)
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 There is a possibility that he might have developed some antibodies against Procrit. I recommend a trial of darbopoetin every 3 weeks at 300 mcg. If he has minimum response, we can increase it to 500 mcg every 3 weeks or to shorten the duration to every 2 weeks. Given his multiple comorbidities, he may have an element of anemia chronic disease. At this point in time, I am hesitant to draw a bone marrow aspirate and biopsy.

## 2014-01-22 NOTE — Assessment & Plan Note (Signed)
This is stable while on diuretics.

## 2014-01-23 ENCOUNTER — Telehealth: Payer: Self-pay | Admitting: Hematology and Oncology

## 2014-01-23 NOTE — Telephone Encounter (Signed)
s.w pt and advised on all appts....pt ok and aware....pt is now pt of Dr. Alvy Bimler

## 2014-02-10 ENCOUNTER — Other Ambulatory Visit: Payer: Self-pay

## 2014-02-12 ENCOUNTER — Ambulatory Visit: Payer: Medicare Other

## 2014-02-12 ENCOUNTER — Encounter: Payer: Self-pay | Admitting: Internal Medicine

## 2014-02-12 ENCOUNTER — Ambulatory Visit (INDEPENDENT_AMBULATORY_CARE_PROVIDER_SITE_OTHER): Payer: Medicare Other | Admitting: Internal Medicine

## 2014-02-12 ENCOUNTER — Other Ambulatory Visit: Payer: Medicare Other

## 2014-02-12 ENCOUNTER — Ambulatory Visit (HOSPITAL_BASED_OUTPATIENT_CLINIC_OR_DEPARTMENT_OTHER): Payer: Medicare Other

## 2014-02-12 ENCOUNTER — Other Ambulatory Visit (HOSPITAL_BASED_OUTPATIENT_CLINIC_OR_DEPARTMENT_OTHER): Payer: Medicare Other

## 2014-02-12 VITALS — BP 154/80 | HR 87 | Ht 68.0 in | Wt 154.0 lb

## 2014-02-12 VITALS — BP 135/47 | HR 74 | Temp 97.5°F

## 2014-02-12 DIAGNOSIS — D649 Anemia, unspecified: Secondary | ICD-10-CM

## 2014-02-12 DIAGNOSIS — D469 Myelodysplastic syndrome, unspecified: Secondary | ICD-10-CM

## 2014-02-12 DIAGNOSIS — R001 Bradycardia, unspecified: Secondary | ICD-10-CM

## 2014-02-12 DIAGNOSIS — N183 Chronic kidney disease, stage 3 (moderate): Secondary | ICD-10-CM

## 2014-02-12 DIAGNOSIS — I442 Atrioventricular block, complete: Secondary | ICD-10-CM

## 2014-02-12 DIAGNOSIS — I498 Other specified cardiac arrhythmias: Secondary | ICD-10-CM

## 2014-02-12 DIAGNOSIS — D630 Anemia in neoplastic disease: Secondary | ICD-10-CM

## 2014-02-12 DIAGNOSIS — D631 Anemia in chronic kidney disease: Secondary | ICD-10-CM

## 2014-02-12 LAB — CBC & DIFF AND RETIC
BASO%: 0.3 % (ref 0.0–2.0)
Basophils Absolute: 0 10*3/uL (ref 0.0–0.1)
EOS%: 3.2 % (ref 0.0–7.0)
Eosinophils Absolute: 0.1 10*3/uL (ref 0.0–0.5)
HEMATOCRIT: 25 % — AB (ref 38.4–49.9)
HEMOGLOBIN: 8.2 g/dL — AB (ref 13.0–17.1)
IMMATURE RETIC FRACT: 10.9 % — AB (ref 3.00–10.60)
LYMPH#: 0.9 10*3/uL (ref 0.9–3.3)
LYMPH%: 25.6 % (ref 14.0–49.0)
MCH: 34 pg — ABNORMAL HIGH (ref 27.2–33.4)
MCHC: 32.8 g/dL (ref 32.0–36.0)
MCV: 103.7 fL — ABNORMAL HIGH (ref 79.3–98.0)
MONO#: 0.3 10*3/uL (ref 0.1–0.9)
MONO%: 8.8 % (ref 0.0–14.0)
NEUT#: 2.1 10*3/uL (ref 1.5–6.5)
NEUT%: 62.1 % (ref 39.0–75.0)
RBC: 2.41 10*6/uL — ABNORMAL LOW (ref 4.20–5.82)
RDW: 21.5 % — ABNORMAL HIGH (ref 11.0–14.6)
Retic %: <0.38 % — ABNORMAL LOW (ref 0.5–1.6)
Retic Ct Abs: 6.75 10*3/uL — ABNORMAL LOW (ref 34.80–93.90)
WBC: 3.4 10*3/uL — AB (ref 4.0–10.3)

## 2014-02-12 LAB — MDC_IDC_ENUM_SESS_TYPE_INCLINIC
Battery Voltage: 3.02 V
Brady Statistic RA Percent Paced: 33 %
Date Time Interrogation Session: 20150806142304
Lead Channel Impedance Value: 425 Ohm
Lead Channel Pacing Threshold Amplitude: 0.875 V
Lead Channel Pacing Threshold Pulse Width: 0.4 ms
Lead Channel Pacing Threshold Pulse Width: 0.4 ms
Lead Channel Sensing Intrinsic Amplitude: 2.5 mV
Lead Channel Setting Pacing Amplitude: 1.125
Lead Channel Setting Pacing Amplitude: 1.625
Lead Channel Setting Pacing Pulse Width: 0.4 ms
Lead Channel Setting Sensing Sensitivity: 4 mV
MDC IDC MSMT BATTERY REMAINING LONGEVITY: 117.6 mo
MDC IDC MSMT LEADCHNL RA PACING THRESHOLD AMPLITUDE: 0.625 V
MDC IDC MSMT LEADCHNL RV IMPEDANCE VALUE: 375 Ohm
MDC IDC MSMT LEADCHNL RV SENSING INTR AMPL: 4.3 mV
MDC IDC PG SERIAL: 3013631
MDC IDC STAT BRADY RV PERCENT PACED: 99.97 %

## 2014-02-12 MED ORDER — FUROSEMIDE 20 MG PO TABS: 20.0000 mg | ORAL_TABLET | Freq: Every day | ORAL | Status: DC

## 2014-02-12 MED ORDER — DARBEPOETIN ALFA-POLYSORBATE 300 MCG/0.6ML IJ SOLN
300.0000 ug | Freq: Once | INTRAMUSCULAR | Status: AC
  Administered 2014-02-12: 300 ug via SUBCUTANEOUS
  Filled 2014-02-12: qty 0.6

## 2014-02-12 NOTE — Assessment & Plan Note (Signed)
His chronic systolic heart failure is well compensated. He will continue his current meds.

## 2014-02-12 NOTE — Patient Instructions (Addendum)
Remote monitoring is used to monitor your pacemaker from home. This monitoring reduces the number of office visits required to check your device to one time per year. It allows [place] to keep an eye on the functioning of your device to ensure it is working properly. You are scheduled for a device check from home on 05-18-2014. You may send your transmission at any time that day. If you have a wireless device, the transmission will be sent automatically. After your physician reviews your transmission, you will receive a postcard with your next transmission date.  Your physician recommends that you schedule a follow-up appointment in: April 2016 with Dr.Taylor

## 2014-02-12 NOTE — Progress Notes (Signed)
HPI [name] returns today for followup. He is a pleasant elderly man with complete heart block, s/p PPM insertion, chronic anemia on EPO, HTN, Myelodysplastic syndrome, and DM. He has done well despite his advanced age. He has mild peripheral edema, which has been treated with diuretic therapy.  Allergies  Allergen Reactions  . Penicillins   . Codeine Sulfate      Current Outpatient Prescriptions  Medication Sig Dispense Refill  . alfuzosin (UROXATRAL) 10 MG 24 hr tablet Take 10 mg by mouth every evening.       Marland Kitchen epoetin alfa (EPOGEN,PROCRIT) 97026 UNIT/ML injection Inject 40,000 Units into the skin every 21 ( twenty-one) days.      . furosemide (LASIX) 20 MG tablet Take 1 tablet (20 mg total) by mouth daily.  90 tablet  3  . levothyroxine (SYNTHROID, LEVOTHROID) 112 MCG tablet Take 112 mcg by mouth daily before breakfast.      . lisinopril (PRINIVIL,ZESTRIL) 10 MG tablet Take 10 mg by mouth every morning.      . Multiple Vitamin (MULTIVITAMIN WITH MINERALS) TABS Take 1 tablet by mouth every morning.      . TOPROL XL 25 MG 24 hr tablet Take 25 mg by mouth daily.       No current facility-administered medications for this visit.     Past Medical History  Diagnosis Date  . Hypertension   . LBBB (left bundle branch block)   . Diabetes mellitus without complication   . Cancer   . Hypothyroidism     nodule  . Cataracts, bilateral   . Anemia     iron deficiency anemia  . Inguinal hernia   . Peripheral vascular disease     Peripheral neuropathy  . Complete heart block   . MDS (myelodysplastic syndrome) 01/05/2014    ROS:   All systems reviewed and negative except as noted in the HPI.   Past Surgical History  Procedure Laterality Date  . Tonsillectomy    . Hiatal hernia repair    . Hernia repair  1979    bilateral inguinal hernia  . Thyroid lobectomy  6/85    right  . Acromionectomy  1998    rotator cuff repair  . Prostate surgery    . Abdominal aortic  aneurysm repair    . Tonsillectomy    . Flexible sigmoidoscopy N/A 12/13/2012    Procedure: FLEXIBLE SIGMOIDOSCOPY;  Surgeon: Beryle Beams, MD;  Location: [facility];  Service: Endoscopy;  Laterality: N/A;  . Hot hemostasis N/A 12/13/2012    Procedure: HOT HEMOSTASIS (ARGON PLASMA COAGULATION/BICAP);  Surgeon: Beryle Beams, MD;  Location: [facility];  Service: Endoscopy;  Laterality: N/A;  . Pacemaker insertion  11-03-13    STJ dual chamber pacemaker implanted by Dr Lovena Le for CHB     Family History  Problem Relation Age of Onset  . Heart disease Mother   . Heart disease Father      History   Social History  . Marital Status: Married    Spouse Name: N/A    Number of Children: N/A  . Years of Education: N/A   Occupational History  . Not on file.   Social History Main Topics  . Smoking status: Former Smoker    Types: Pipe    Quit date: 11/27/1963  . Smokeless tobacco: Never Used  . Alcohol Use: No  . Drug Use: No  . Sexual Activity: Not on file   Other Topics Concern  .  Not on file   Social History Narrative  . No narrative on file     BP 154/80  Pulse 87  Ht 5\' 8"  (1.727 m)  Wt 154 lb (69.854 kg)  BMI 23.42 kg/m2  Physical Exam:  Elderly appearing 80 yo man, NAD HEENT: Unremarkable Neck:  No JVD, no thyromegally Back:  No CVA tenderness Lungs:  Clear with no wheezes, well healed PM incision. HEART:  Regular rate rhythm, no murmurs, no rubs, no clicks Abd:  soft, positive bowel sounds, no organomegally, no rebound, no guarding Ext:  2 plus pulses, no edema, no cyanosis, no clubbing Skin:  No rashes no nodules Neuro:  CN II through XII intact, motor grossly intact  EKG  DEVICE  Normal device function.  See PaceArt for details.   Assess/Plan:

## 2014-02-17 ENCOUNTER — Ambulatory Visit (INDEPENDENT_AMBULATORY_CARE_PROVIDER_SITE_OTHER): Payer: Medicare Other | Admitting: Family Medicine

## 2014-02-17 VITALS — BP 117/56 | HR 82 | Temp 97.9°F | Resp 16 | Wt 155.0 lb

## 2014-02-17 DIAGNOSIS — S61409A Unspecified open wound of unspecified hand, initial encounter: Secondary | ICD-10-CM

## 2014-02-17 DIAGNOSIS — S61401A Unspecified open wound of right hand, initial encounter: Secondary | ICD-10-CM

## 2014-02-17 NOTE — Progress Notes (Signed)
[facility] [address] [place] [address] [phone]  Date:  02/17/2014   Name:  Anthony Hutchinson   DOB:  22-Oct-1921   MRN:  [id number]  PCP:  Merrilee Seashore, MD    Chief Complaint: Laceration   History of Present Illness:  Anthony Hutchinson is a 80 y.o. very pleasant male patient who presents with the following:  He cut his right hand on his steering wheel on Sunday (today is Tuesday)- he is learning how to operate hand controls and his got hurt on one of these controls.  He has a nickel sized tear on the dorsum of his right hand.  He has tried to dress this at home but notes that it bleeds when he changes the dressing.  We gave him a tetanus shot last year.  Patient Active Problem List   Diagnosis Date Noted  . MDS (myelodysplastic syndrome) 01/05/2014  . Chronic kidney disease 01/05/2014  . Atrioventricular block, complete 11/03/2013  . Chronic systolic heart failure 13/24/4010  . Bradycardia 10/31/2013  . Complete heart block 10/31/2013  . Cholelithiasis 05/14/2012  . Anemia associated with chronic renal failure 05/25/2011  . Anemia in neoplastic disease 05/25/2011    Past Medical History  Diagnosis Date  . Hypertension   . LBBB (left bundle branch block)   . Diabetes mellitus without complication   . Cancer   . Hypothyroidism     nodule  . Cataracts, bilateral   . Anemia     iron deficiency anemia  . Inguinal hernia   . Peripheral vascular disease     Peripheral neuropathy  . Complete heart block   . MDS (myelodysplastic syndrome) 01/05/2014    Past Surgical History  Procedure Laterality Date  . Tonsillectomy    . Hiatal hernia repair    . Hernia repair  1979    bilateral inguinal hernia  . Thyroid lobectomy  6/85    right  . Acromionectomy  1998    rotator cuff repair  . Prostate surgery    . Abdominal aortic aneurysm repair    . Tonsillectomy    . Flexible sigmoidoscopy N/A 12/13/2012    Procedure: FLEXIBLE SIGMOIDOSCOPY;   Surgeon: Beryle Beams, MD;  Location: [facility];  Service: Endoscopy;  Laterality: N/A;  . Hot hemostasis N/A 12/13/2012    Procedure: HOT HEMOSTASIS (ARGON PLASMA COAGULATION/BICAP);  Surgeon: Beryle Beams, MD;  Location: [facility];  Service: Endoscopy;  Laterality: N/A;  . Pacemaker insertion  11-03-13    STJ dual chamber pacemaker implanted by Dr Lovena Le for CHB    History  Substance Use Topics  . Smoking status: Former Smoker    Types: Pipe    Quit date: 11/27/1963  . Smokeless tobacco: Never Used  . Alcohol Use: No    Family History  Problem Relation Age of Onset  . Heart disease Mother   . Heart disease Father     Allergies  Allergen Reactions  . Penicillins   . Codeine Sulfate     Medication list has been reviewed and updated.  Current Outpatient Prescriptions on File Prior to Visit  Medication Sig Dispense Refill  . alfuzosin (UROXATRAL) 10 MG 24 hr tablet Take 10 mg by mouth every evening.       Marland Kitchen epoetin alfa (EPOGEN,PROCRIT) 27253 UNIT/ML injection Inject 40,000 Units into the skin every 21 ( twenty-one) days.      . furosemide (LASIX) 20 MG tablet Take 1 tablet (20 mg total)  by mouth daily.  90 tablet  3  . levothyroxine (SYNTHROID, LEVOTHROID) 112 MCG tablet Take 112 mcg by mouth daily before breakfast.      . lisinopril (PRINIVIL,ZESTRIL) 10 MG tablet Take 10 mg by mouth every morning.      . Multiple Vitamin (MULTIVITAMIN WITH MINERALS) TABS Take 1 tablet by mouth every morning.      . TOPROL XL 25 MG 24 hr tablet Take 25 mg by mouth daily.       No current facility-administered medications on file prior to visit.    Review of Systems:  As per HPI- otherwise negative.   Physical Examination: Filed Vitals:   02/17/14 0936  BP: 117/56  Pulse: 82  Temp: 97.9 F (36.6 C)  Resp: 16   Filed Vitals:   02/17/14 0936  Weight: 155 lb (70.308 kg)   Body mass index is 23.57 kg/(m^2). Ideal Body Weight:    GEN: WDWN, NAD, Non-toxic, A & O x 3,  looks well HEENT: Atraumatic, Normocephalic. Neck supple. No masses, No LAD. Ears and Nose: No external deformity. CV: RRR, No M/G/R. No JVD. No thrill. No extra heart sounds. PULM: CTA B, no wheezes, crackles, rhonchi. No retractions. No resp. distress. No accessory muscle use. ABD: S, NT, ND, +BS. No rebound. No HSM. EXTR: No c/c/e NEURO Normal gait.  PSYCH: Normally interactive. Conversant. Not depressed or anxious appearing.  Calm demeanor.  Right hand: there is a superficial skin tear.  No deeper structures are involved.  Normal function of the hand and wrist.   Wound washed with soap and water, then dressed with vaseline gauze, a non -stick pad and cobain  Assessment and Plan: Open wound of hand with complication, right, initial encounter  He will continue to dress the wound as above until it no longer bleeds,  Follow up as needed  Signed Lamar Blinks, MD

## 2014-02-17 NOTE — Patient Instructions (Signed)
Good to see you today!  Continue to keep your wound dressed as we showed you today.  If you seem to have any sign of infection (redness, heat, pus or pain) please come bck and see [place].  Otherwise once the area has scabbed over you can stop applying the dressing

## 2014-03-05 ENCOUNTER — Other Ambulatory Visit (HOSPITAL_BASED_OUTPATIENT_CLINIC_OR_DEPARTMENT_OTHER): Payer: Medicare Other

## 2014-03-05 ENCOUNTER — Other Ambulatory Visit: Payer: Self-pay | Admitting: Hematology and Oncology

## 2014-03-05 ENCOUNTER — Ambulatory Visit (HOSPITAL_BASED_OUTPATIENT_CLINIC_OR_DEPARTMENT_OTHER): Payer: Medicare Other

## 2014-03-05 VITALS — BP 137/55 | HR 60 | Temp 97.5°F

## 2014-03-05 DIAGNOSIS — D649 Anemia, unspecified: Secondary | ICD-10-CM

## 2014-03-05 DIAGNOSIS — D630 Anemia in neoplastic disease: Secondary | ICD-10-CM

## 2014-03-05 DIAGNOSIS — D469 Myelodysplastic syndrome, unspecified: Secondary | ICD-10-CM

## 2014-03-05 DIAGNOSIS — N183 Chronic kidney disease, stage 3 (moderate): Secondary | ICD-10-CM

## 2014-03-05 DIAGNOSIS — D631 Anemia in chronic kidney disease: Secondary | ICD-10-CM

## 2014-03-05 LAB — CBC & DIFF AND RETIC
BASO%: 0.3 % (ref 0.0–2.0)
Basophils Absolute: 0 10*3/uL (ref 0.0–0.1)
EOS%: 1.8 % (ref 0.0–7.0)
Eosinophils Absolute: 0.1 10*3/uL (ref 0.0–0.5)
HCT: 25.7 % — ABNORMAL LOW (ref 38.4–49.9)
HGB: 8.7 g/dL — ABNORMAL LOW (ref 13.0–17.1)
Immature Retic Fract: 9.1 % (ref 3.00–10.60)
LYMPH#: 1.1 10*3/uL (ref 0.9–3.3)
LYMPH%: 28.8 % (ref 14.0–49.0)
MCH: 34.9 pg — AB (ref 27.2–33.4)
MCHC: 33.9 g/dL (ref 32.0–36.0)
MCV: 103.2 fL — ABNORMAL HIGH (ref 79.3–98.0)
MONO#: 0.4 10*3/uL (ref 0.1–0.9)
MONO%: 9.4 % (ref 0.0–14.0)
NEUT%: 59.7 % (ref 39.0–75.0)
NEUTROS ABS: 2.3 10*3/uL (ref 1.5–6.5)
Platelets: 68 10*3/uL — ABNORMAL LOW (ref 140–400)
RBC: 2.49 10*6/uL — ABNORMAL LOW (ref 4.20–5.82)
RDW: 21.3 % — ABNORMAL HIGH (ref 11.0–14.6)
RETIC CT ABS: 6.97 10*3/uL — AB (ref 34.80–93.90)
Retic %: <0.38 % — ABNORMAL LOW (ref 0.5–1.6)
WBC: 3.9 10*3/uL — ABNORMAL LOW (ref 4.0–10.3)

## 2014-03-05 MED ORDER — DARBEPOETIN ALFA-POLYSORBATE 300 MCG/0.6ML IJ SOLN
300.0000 ug | Freq: Once | INTRAMUSCULAR | Status: AC
  Administered 2014-03-05: 300 ug via SUBCUTANEOUS
  Filled 2014-03-05: qty 0.6

## 2014-03-26 ENCOUNTER — Ambulatory Visit (HOSPITAL_BASED_OUTPATIENT_CLINIC_OR_DEPARTMENT_OTHER): Payer: Medicare Other

## 2014-03-26 ENCOUNTER — Other Ambulatory Visit: Payer: Self-pay | Admitting: Hematology and Oncology

## 2014-03-26 ENCOUNTER — Other Ambulatory Visit (HOSPITAL_BASED_OUTPATIENT_CLINIC_OR_DEPARTMENT_OTHER): Payer: Medicare Other

## 2014-03-26 VITALS — BP 117/71 | HR 80 | Temp 97.9°F

## 2014-03-26 DIAGNOSIS — D631 Anemia in chronic kidney disease: Secondary | ICD-10-CM

## 2014-03-26 DIAGNOSIS — D649 Anemia, unspecified: Secondary | ICD-10-CM

## 2014-03-26 DIAGNOSIS — D630 Anemia in neoplastic disease: Secondary | ICD-10-CM

## 2014-03-26 DIAGNOSIS — N183 Chronic kidney disease, stage 3 (moderate): Secondary | ICD-10-CM

## 2014-03-26 DIAGNOSIS — D469 Myelodysplastic syndrome, unspecified: Secondary | ICD-10-CM

## 2014-03-26 LAB — CBC & DIFF AND RETIC
BASO%: 0.3 % (ref 0.0–2.0)
BASOS ABS: 0 10*3/uL (ref 0.0–0.1)
EOS ABS: 0.1 10*3/uL (ref 0.0–0.5)
EOS%: 2 % (ref 0.0–7.0)
HCT: 25.5 % — ABNORMAL LOW (ref 38.4–49.9)
HEMOGLOBIN: 8.6 g/dL — AB (ref 13.0–17.1)
Immature Retic Fract: 4.8 % (ref 3.00–10.60)
LYMPH%: 25.4 % (ref 14.0–49.0)
MCH: 34.7 pg — ABNORMAL HIGH (ref 27.2–33.4)
MCHC: 33.7 g/dL (ref 32.0–36.0)
MCV: 102.8 fL — ABNORMAL HIGH (ref 79.3–98.0)
MONO#: 0.4 10*3/uL (ref 0.1–0.9)
MONO%: 9.3 % (ref 0.0–14.0)
NEUT%: 63 % (ref 39.0–75.0)
NEUTROS ABS: 2.5 10*3/uL (ref 1.5–6.5)
NRBC: 0 % (ref 0–0)
Platelets: 67 10*3/uL — ABNORMAL LOW (ref 140–400)
RBC: 2.48 10*6/uL — ABNORMAL LOW (ref 4.20–5.82)
RDW: 23.2 % — AB (ref 11.0–14.6)
Retic Ct Abs: 6.7 10*3/uL — ABNORMAL LOW (ref 34.80–93.90)
WBC: 4 10*3/uL (ref 4.0–10.3)
lymph#: 1 10*3/uL (ref 0.9–3.3)

## 2014-03-26 LAB — TECHNOLOGIST REVIEW

## 2014-03-26 MED ORDER — DARBEPOETIN ALFA-POLYSORBATE 500 MCG/ML IJ SOLN
500.0000 ug | Freq: Once | INTRAMUSCULAR | Status: AC
  Administered 2014-03-26: 500 ug via SUBCUTANEOUS
  Filled 2014-03-26: qty 1

## 2014-04-16 ENCOUNTER — Other Ambulatory Visit (HOSPITAL_BASED_OUTPATIENT_CLINIC_OR_DEPARTMENT_OTHER): Payer: Medicare Other

## 2014-04-16 ENCOUNTER — Ambulatory Visit (HOSPITAL_BASED_OUTPATIENT_CLINIC_OR_DEPARTMENT_OTHER): Payer: Medicare Other

## 2014-04-16 VITALS — BP 116/49 | HR 60 | Temp 98.0°F

## 2014-04-16 DIAGNOSIS — D649 Anemia, unspecified: Secondary | ICD-10-CM

## 2014-04-16 DIAGNOSIS — D469 Myelodysplastic syndrome, unspecified: Secondary | ICD-10-CM

## 2014-04-16 DIAGNOSIS — D630 Anemia in neoplastic disease: Secondary | ICD-10-CM

## 2014-04-16 DIAGNOSIS — N183 Chronic kidney disease, stage 3 (moderate): Secondary | ICD-10-CM

## 2014-04-16 DIAGNOSIS — D631 Anemia in chronic kidney disease: Secondary | ICD-10-CM

## 2014-04-16 LAB — CBC & DIFF AND RETIC
BASO%: 0.2 % (ref 0.0–2.0)
Basophils Absolute: 0 10*3/uL (ref 0.0–0.1)
EOS%: 2.2 % (ref 0.0–7.0)
Eosinophils Absolute: 0.1 10*3/uL (ref 0.0–0.5)
HCT: 27.9 % — ABNORMAL LOW (ref 38.4–49.9)
HGB: 9.4 g/dL — ABNORMAL LOW (ref 13.0–17.1)
Immature Retic Fract: 3.5 % (ref 3.00–10.60)
LYMPH%: 27.3 % (ref 14.0–49.0)
MCH: 34.6 pg — AB (ref 27.2–33.4)
MCHC: 33.7 g/dL (ref 32.0–36.0)
MCV: 102.6 fL — ABNORMAL HIGH (ref 79.3–98.0)
MONO#: 0.4 10*3/uL (ref 0.1–0.9)
MONO%: 9.2 % (ref 0.0–14.0)
NEUT#: 2.7 10*3/uL (ref 1.5–6.5)
NEUT%: 61.1 % (ref 39.0–75.0)
PLATELETS: 83 10*3/uL — AB (ref 140–400)
RBC: 2.72 10*6/uL — AB (ref 4.20–5.82)
RDW: 23 % — ABNORMAL HIGH (ref 11.0–14.6)
Retic Ct Abs: 9.25 10*3/uL — ABNORMAL LOW (ref 34.80–93.90)
WBC: 4.5 10*3/uL (ref 4.0–10.3)
lymph#: 1.2 10*3/uL (ref 0.9–3.3)

## 2014-04-16 LAB — TECHNOLOGIST REVIEW

## 2014-04-16 MED ORDER — DARBEPOETIN ALFA-POLYSORBATE 500 MCG/ML IJ SOLN
500.0000 ug | Freq: Once | INTRAMUSCULAR | Status: AC
  Administered 2014-04-16: 500 ug via SUBCUTANEOUS
  Filled 2014-04-16: qty 1

## 2014-05-07 ENCOUNTER — Ambulatory Visit (HOSPITAL_BASED_OUTPATIENT_CLINIC_OR_DEPARTMENT_OTHER): Payer: Medicare Other

## 2014-05-07 ENCOUNTER — Other Ambulatory Visit (HOSPITAL_BASED_OUTPATIENT_CLINIC_OR_DEPARTMENT_OTHER): Payer: Medicare Other

## 2014-05-07 VITALS — BP 141/56 | HR 64 | Temp 98.1°F | Resp 16

## 2014-05-07 DIAGNOSIS — D469 Myelodysplastic syndrome, unspecified: Secondary | ICD-10-CM

## 2014-05-07 DIAGNOSIS — N189 Chronic kidney disease, unspecified: Secondary | ICD-10-CM

## 2014-05-07 DIAGNOSIS — D630 Anemia in neoplastic disease: Secondary | ICD-10-CM

## 2014-05-07 DIAGNOSIS — D631 Anemia in chronic kidney disease: Secondary | ICD-10-CM

## 2014-05-07 DIAGNOSIS — N183 Chronic kidney disease, stage 3 (moderate): Secondary | ICD-10-CM

## 2014-05-07 LAB — TECHNOLOGIST REVIEW

## 2014-05-07 LAB — CBC & DIFF AND RETIC
BASO%: 0.5 % (ref 0.0–2.0)
Basophils Absolute: 0 10*3/uL (ref 0.0–0.1)
EOS%: 3.2 % (ref 0.0–7.0)
Eosinophils Absolute: 0.1 10*3/uL (ref 0.0–0.5)
HCT: 27.1 % — ABNORMAL LOW (ref 38.4–49.9)
HGB: 9 g/dL — ABNORMAL LOW (ref 13.0–17.1)
Immature Retic Fract: 4.1 % (ref 3.00–10.60)
LYMPH#: 1 10*3/uL (ref 0.9–3.3)
LYMPH%: 25.3 % (ref 14.0–49.0)
MCH: 34.5 pg — ABNORMAL HIGH (ref 27.2–33.4)
MCHC: 33.2 g/dL (ref 32.0–36.0)
MCV: 103.8 fL — ABNORMAL HIGH (ref 79.3–98.0)
MONO#: 0.5 10*3/uL (ref 0.1–0.9)
MONO%: 13.2 % (ref 0.0–14.0)
NEUT#: 2.3 10*3/uL (ref 1.5–6.5)
NEUT%: 57.8 % (ref 39.0–75.0)
PLATELETS: 70 10*3/uL — AB (ref 140–400)
RBC: 2.61 10*6/uL — AB (ref 4.20–5.82)
RDW: 24.1 % — ABNORMAL HIGH (ref 11.0–14.6)
Retic %: <0.38 % — ABNORMAL LOW (ref 0.5–1.6)
Retic Ct Abs: 7.83 10*3/uL — ABNORMAL LOW (ref 34.80–93.90)
WBC: 4 10*3/uL (ref 4.0–10.3)

## 2014-05-07 MED ORDER — DARBEPOETIN ALFA-POLYSORBATE 500 MCG/ML IJ SOLN
500.0000 ug | Freq: Once | INTRAMUSCULAR | Status: AC
  Administered 2014-05-07: 500 ug via SUBCUTANEOUS
  Filled 2014-05-07: qty 1

## 2014-05-07 NOTE — Patient Instructions (Signed)
Darbepoetin Alfa injection What is this medicine? DARBEPOETIN ALFA (dar be POE e tin AL fa) helps your body make more red blood cells. It is used to treat anemia caused by chronic kidney failure and chemotherapy. This medicine may be used for other purposes; ask your health care provider or pharmacist if you have questions. COMMON BRAND NAME(S): Aranesp What should I tell my health care provider before I take this medicine? They need to know if you have any of these conditions: -blood clotting disorders or history of blood clots -cancer patient not on chemotherapy -cystic fibrosis -heart disease, such as angina, heart failure, or a history of a heart attack -hemoglobin level of 12 g/dL or greater -high blood pressure -low levels of folate, iron, or vitamin B12 -seizures -an unusual or allergic reaction to darbepoetin, erythropoietin, albumin, hamster proteins, latex, other medicines, foods, dyes, or preservatives -pregnant or trying to get pregnant -breast-feeding How should I use this medicine? This medicine is for injection into a vein or under the skin. It is usually given by a health care professional in a hospital or clinic setting. If you get this medicine at home, you will be taught how to prepare and give this medicine. Do not shake the solution before you withdraw a dose. Use exactly as directed. Take your medicine at regular intervals. Do not take your medicine more often than directed. It is important that you put your used needles and syringes in a special sharps container. Do not put them in a trash can. If you do not have a sharps container, call your pharmacist or healthcare provider to get one. Talk to your pediatrician regarding the use of this medicine in children. While this medicine may be used in children as young as 1 year for selected conditions, precautions do apply. Overdosage: If you think you have taken too much of this medicine contact a poison control center or  emergency room at once. NOTE: This medicine is only for you. Do not share this medicine with others. What if I miss a dose? If you miss a dose, take it as soon as you can. If it is almost time for your next dose, take only that dose. Do not take double or extra doses. What may interact with this medicine? Do not take this medicine with any of the following medications: -epoetin alfa This list may not describe all possible interactions. Give your health care provider a list of all the medicines, herbs, non-prescription drugs, or dietary supplements you use. Also tell them if you smoke, drink alcohol, or use illegal drugs. Some items may interact with your medicine. What should I watch for while using this medicine? Visit your prescriber or health care professional for regular checks on your progress and for the needed blood tests and blood pressure measurements. It is especially important for the doctor to make sure your hemoglobin level is in the desired range, to limit the risk of potential side effects and to give you the best benefit. Keep all appointments for any recommended tests. Check your blood pressure as directed. Ask your doctor what your blood pressure should be and when you should contact him or her. As your body makes more red blood cells, you may need to take iron, folic acid, or vitamin B supplements. Ask your doctor or health care provider which products are right for you. If you have kidney disease continue dietary restrictions, even though this medication can make you feel better. Talk with your doctor or health   care professional about the foods you eat and the vitamins that you take. What side effects may I notice from receiving this medicine? Side effects that you should report to your doctor or health care professional as soon as possible: -allergic reactions like skin rash, itching or hives, swelling of the face, lips, or tongue -breathing problems -changes in vision -chest  pain -confusion, trouble speaking or understanding -feeling faint or lightheaded, falls -high blood pressure -muscle aches or pains -pain, swelling, warmth in the leg -rapid weight gain -severe headaches -sudden numbness or weakness of the face, arm or leg -trouble walking, dizziness, loss of balance or coordination -seizures (convulsions) -swelling of the ankles, feet, hands -unusually weak or tired Side effects that usually do not require medical attention (report to your doctor or health care professional if they continue or are bothersome): -diarrhea -fever, chills (flu-like symptoms) -headaches -nausea, vomiting -redness, stinging, or swelling at site where injected This list may not describe all possible side effects. Call your doctor for medical advice about side effects. You may report side effects to FDA at 1-800-FDA-1088. Where should I keep my medicine? Keep out of the reach of children. Store in a refrigerator between 2 and 8 degrees C (36 and 46 degrees F). Do not freeze. Do not shake. Throw away any unused portion if using a single-dose vial. Throw away any unused medicine after the expiration date. NOTE: This sheet is a summary. It may not cover all possible information. If you have questions about this medicine, talk to your doctor, pharmacist, or health care provider.  2015, [employer]vier/[employer] Standard. (2008-06-09 10:23:57)  

## 2014-05-18 ENCOUNTER — Encounter: Payer: Self-pay | Admitting: Internal Medicine

## 2014-05-18 ENCOUNTER — Ambulatory Visit (INDEPENDENT_AMBULATORY_CARE_PROVIDER_SITE_OTHER): Payer: Medicare Other | Admitting: *Deleted

## 2014-05-18 DIAGNOSIS — I442 Atrioventricular block, complete: Secondary | ICD-10-CM

## 2014-05-18 NOTE — Progress Notes (Signed)
Remote pacemaker transmission.   

## 2014-05-19 LAB — MDC_IDC_ENUM_SESS_TYPE_REMOTE
Battery Remaining Longevity: 120 mo
Battery Remaining Percentage: 95.5 %
Battery Voltage: 3.01 V
Brady Statistic AS VS Percent: 1 %
Brady Statistic RA Percent Paced: 34 %
Date Time Interrogation Session: 20151109070010
Implantable Pulse Generator Serial Number: 3013631
Lead Channel Impedance Value: 440 Ohm
Lead Channel Pacing Threshold Amplitude: 1.125 V
Lead Channel Sensing Intrinsic Amplitude: 2.2 mV
Lead Channel Setting Pacing Amplitude: 1.125
Lead Channel Setting Pacing Amplitude: 2.125
Lead Channel Setting Pacing Pulse Width: 0.4 ms
MDC IDC MSMT LEADCHNL RA PACING THRESHOLD PULSEWIDTH: 0.4 ms
MDC IDC MSMT LEADCHNL RV IMPEDANCE VALUE: 400 Ohm
MDC IDC MSMT LEADCHNL RV PACING THRESHOLD AMPLITUDE: 0.875 V
MDC IDC MSMT LEADCHNL RV PACING THRESHOLD PULSEWIDTH: 0.4 ms
MDC IDC SET LEADCHNL RV SENSING SENSITIVITY: 4 mV
MDC IDC STAT BRADY AP VP PERCENT: 37 %
MDC IDC STAT BRADY AP VS PERCENT: 1 %
MDC IDC STAT BRADY AS VP PERCENT: 63 %
MDC IDC STAT BRADY RV PERCENT PACED: 99 %

## 2014-05-27 ENCOUNTER — Encounter: Payer: Self-pay | Admitting: Cardiology

## 2014-05-28 ENCOUNTER — Ambulatory Visit (HOSPITAL_BASED_OUTPATIENT_CLINIC_OR_DEPARTMENT_OTHER): Payer: Medicare Other

## 2014-05-28 ENCOUNTER — Other Ambulatory Visit: Payer: Self-pay | Admitting: Hematology and Oncology

## 2014-05-28 ENCOUNTER — Telehealth: Payer: Self-pay | Admitting: Hematology and Oncology

## 2014-05-28 ENCOUNTER — Other Ambulatory Visit (HOSPITAL_BASED_OUTPATIENT_CLINIC_OR_DEPARTMENT_OTHER): Payer: Medicare Other

## 2014-05-28 DIAGNOSIS — D630 Anemia in neoplastic disease: Secondary | ICD-10-CM

## 2014-05-28 DIAGNOSIS — D631 Anemia in chronic kidney disease: Secondary | ICD-10-CM

## 2014-05-28 DIAGNOSIS — D469 Myelodysplastic syndrome, unspecified: Secondary | ICD-10-CM

## 2014-05-28 DIAGNOSIS — D649 Anemia, unspecified: Secondary | ICD-10-CM

## 2014-05-28 DIAGNOSIS — N183 Chronic kidney disease, stage 3 (moderate): Secondary | ICD-10-CM

## 2014-05-28 LAB — CBC & DIFF AND RETIC
BASO%: 0.3 % (ref 0.0–2.0)
Basophils Absolute: 0 10*3/uL (ref 0.0–0.1)
EOS%: 1.7 % (ref 0.0–7.0)
Eosinophils Absolute: 0.1 10*3/uL (ref 0.0–0.5)
HCT: 27.7 % — ABNORMAL LOW (ref 38.4–49.9)
HGB: 9.2 g/dL — ABNORMAL LOW (ref 13.0–17.1)
IMMATURE RETIC FRACT: 12.5 % — AB (ref 3.00–10.60)
LYMPH#: 1.3 10*3/uL (ref 0.9–3.3)
LYMPH%: 19.9 % (ref 14.0–49.0)
MCH: 34.1 pg — AB (ref 27.2–33.4)
MCHC: 33.2 g/dL (ref 32.0–36.0)
MCV: 102.6 fL — AB (ref 79.3–98.0)
MONO#: 0.5 10*3/uL (ref 0.1–0.9)
MONO%: 8.1 % (ref 0.0–14.0)
NEUT#: 4.6 10*3/uL (ref 1.5–6.5)
NEUT%: 70 % (ref 39.0–75.0)
Platelets: 86 10*3/uL — ABNORMAL LOW (ref 140–400)
RBC: 2.7 10*6/uL — AB (ref 4.20–5.82)
RDW: 23.7 % — AB (ref 11.0–14.6)
RETIC CT ABS: 10.53 10*3/uL — AB (ref 34.80–93.90)
Retic %: 0.39 % — ABNORMAL LOW (ref 0.80–1.80)
WBC: 6.6 10*3/uL (ref 4.0–10.3)

## 2014-05-28 MED ORDER — DARBEPOETIN ALFA 500 MCG/ML IJ SOSY
500.0000 ug | PREFILLED_SYRINGE | Freq: Once | INTRAMUSCULAR | Status: AC
  Administered 2014-05-28: 500 ug via SUBCUTANEOUS
  Filled 2014-05-28: qty 1

## 2014-05-28 NOTE — Progress Notes (Signed)
Pt to have flu shot today but stated he had it already.  Pt escorted to lobby and a note given to him as well as RN verbally explaining to pt and son that his appointments will be changing to every two weeks instead of every three.  Son and pt repeated back that the appointments will be every two weeks now.

## 2014-05-28 NOTE — Patient Instructions (Signed)
Darbepoetin Alfa injection What is this medicine? DARBEPOETIN ALFA (dar be POE e tin AL fa) helps your body make more red blood cells. It is used to treat anemia caused by chronic kidney failure and chemotherapy. This medicine may be used for other purposes; ask your health care provider or pharmacist if you have questions. COMMON BRAND NAME(S): Aranesp What should I tell my health care provider before I take this medicine? They need to know if you have any of these conditions: -blood clotting disorders or history of blood clots -cancer patient not on chemotherapy -cystic fibrosis -heart disease, such as angina, heart failure, or a history of a heart attack -hemoglobin level of 12 g/dL or greater -high blood pressure -low levels of folate, iron, or vitamin B12 -seizures -an unusual or allergic reaction to darbepoetin, erythropoietin, albumin, hamster proteins, latex, other medicines, foods, dyes, or preservatives -pregnant or trying to get pregnant -breast-feeding How should I use this medicine? This medicine is for injection into a vein or under the skin. It is usually given by a health care professional in a hospital or clinic setting. If you get this medicine at home, you will be taught how to prepare and give this medicine. Do not shake the solution before you withdraw a dose. Use exactly as directed. Take your medicine at regular intervals. Do not take your medicine more often than directed. It is important that you put your used needles and syringes in a special sharps container. Do not put them in a trash can. If you do not have a sharps container, call your pharmacist or healthcare provider to get one. Talk to your pediatrician regarding the use of this medicine in children. While this medicine may be used in children as young as 1 year for selected conditions, precautions do apply. Overdosage: If you think you have taken too much of this medicine contact a poison control center or  emergency room at once. NOTE: This medicine is only for you. Do not share this medicine with others. What if I miss a dose? If you miss a dose, take it as soon as you can. If it is almost time for your next dose, take only that dose. Do not take double or extra doses. What may interact with this medicine? Do not take this medicine with any of the following medications: -epoetin alfa This list may not describe all possible interactions. Give your health care provider a list of all the medicines, herbs, non-prescription drugs, or dietary supplements you use. Also tell them if you smoke, drink alcohol, or use illegal drugs. Some items may interact with your medicine. What should I watch for while using this medicine? Visit your prescriber or health care professional for regular checks on your progress and for the needed blood tests and blood pressure measurements. It is especially important for the doctor to make sure your hemoglobin level is in the desired range, to limit the risk of potential side effects and to give you the best benefit. Keep all appointments for any recommended tests. Check your blood pressure as directed. Ask your doctor what your blood pressure should be and when you should contact him or her. As your body makes more red blood cells, you may need to take iron, folic acid, or vitamin B supplements. Ask your doctor or health care provider which products are right for you. If you have kidney disease continue dietary restrictions, even though this medication can make you feel better. Talk with your doctor or health   care professional about the foods you eat and the vitamins that you take. What side effects may I notice from receiving this medicine? Side effects that you should report to your doctor or health care professional as soon as possible: -allergic reactions like skin rash, itching or hives, swelling of the face, lips, or tongue -breathing problems -changes in vision -chest  pain -confusion, trouble speaking or understanding -feeling faint or lightheaded, falls -high blood pressure -muscle aches or pains -pain, swelling, warmth in the leg -rapid weight gain -severe headaches -sudden numbness or weakness of the face, arm or leg -trouble walking, dizziness, loss of balance or coordination -seizures (convulsions) -swelling of the ankles, feet, hands -unusually weak or tired Side effects that usually do not require medical attention (report to your doctor or health care professional if they continue or are bothersome): -diarrhea -fever, chills (flu-like symptoms) -headaches -nausea, vomiting -redness, stinging, or swelling at site where injected This list may not describe all possible side effects. Call your doctor for medical advice about side effects. You may report side effects to FDA at 1-800-FDA-1088. Where should I keep my medicine? Keep out of the reach of children. Store in a refrigerator between 2 and 8 degrees C (36 and 46 degrees F). Do not freeze. Do not shake. Throw away any unused portion if using a single-dose vial. Throw away any unused medicine after the expiration date. NOTE: This sheet is a summary. It may not cover all possible information. If you have questions about this medicine, talk to your doctor, pharmacist, or health care provider.  2015, [employer]vier/[employer] Standard. (2008-06-09 10:23:57)  

## 2014-05-28 NOTE — Progress Notes (Signed)
I noticed minimum response on Aranesp every 3 weeks. We will continue to same dose at 500 g but to change him to every 2 weeks.

## 2014-05-28 NOTE — Telephone Encounter (Signed)
s.w. pt wife and advised on new appts....mailed pt appt sched/letter

## 2014-06-01 ENCOUNTER — Other Ambulatory Visit: Payer: Self-pay

## 2014-06-01 MED ORDER — FUROSEMIDE 20 MG PO TABS: 20.0000 mg | ORAL_TABLET | Freq: Every day | ORAL | Status: DC

## 2014-06-11 ENCOUNTER — Other Ambulatory Visit (HOSPITAL_BASED_OUTPATIENT_CLINIC_OR_DEPARTMENT_OTHER): Payer: Medicare Other

## 2014-06-11 ENCOUNTER — Ambulatory Visit (HOSPITAL_BASED_OUTPATIENT_CLINIC_OR_DEPARTMENT_OTHER): Payer: Medicare Other

## 2014-06-11 DIAGNOSIS — D469 Myelodysplastic syndrome, unspecified: Secondary | ICD-10-CM

## 2014-06-11 DIAGNOSIS — D649 Anemia, unspecified: Secondary | ICD-10-CM

## 2014-06-11 DIAGNOSIS — D631 Anemia in chronic kidney disease: Secondary | ICD-10-CM

## 2014-06-11 DIAGNOSIS — D630 Anemia in neoplastic disease: Secondary | ICD-10-CM

## 2014-06-11 DIAGNOSIS — N183 Chronic kidney disease, stage 3 (moderate): Secondary | ICD-10-CM

## 2014-06-11 LAB — CBC WITH DIFFERENTIAL/PLATELET
BASO%: 0.5 % (ref 0.0–2.0)
Basophils Absolute: 0 10*3/uL (ref 0.0–0.1)
EOS ABS: 0.2 10*3/uL (ref 0.0–0.5)
EOS%: 2.5 % (ref 0.0–7.0)
HCT: 29.9 % — ABNORMAL LOW (ref 38.4–49.9)
HGB: 9.8 g/dL — ABNORMAL LOW (ref 13.0–17.1)
LYMPH%: 24.6 % (ref 14.0–49.0)
MCH: 33.9 pg — ABNORMAL HIGH (ref 27.2–33.4)
MCHC: 32.8 g/dL (ref 32.0–36.0)
MCV: 103.5 fL — ABNORMAL HIGH (ref 79.3–98.0)
MONO#: 0.5 10*3/uL (ref 0.1–0.9)
MONO%: 8.5 % (ref 0.0–14.0)
NEUT%: 63.9 % (ref 39.0–75.0)
NEUTROS ABS: 3.8 10*3/uL (ref 1.5–6.5)
Platelets: 80 10*3/uL — ABNORMAL LOW (ref 140–400)
RBC: 2.89 10*6/uL — ABNORMAL LOW (ref 4.20–5.82)
RDW: 25.9 % — AB (ref 11.0–14.6)
WBC: 6 10*3/uL (ref 4.0–10.3)
lymph#: 1.5 10*3/uL (ref 0.9–3.3)
nRBC: 0 % (ref 0–0)

## 2014-06-11 MED ORDER — DARBEPOETIN ALFA 500 MCG/ML IJ SOSY
500.0000 ug | PREFILLED_SYRINGE | Freq: Once | INTRAMUSCULAR | Status: AC
  Administered 2014-06-11: 500 ug via SUBCUTANEOUS
  Filled 2014-06-11: qty 1

## 2014-06-11 NOTE — Patient Instructions (Signed)
Darbepoetin Alfa injection What is this medicine? DARBEPOETIN ALFA (dar be POE e tin AL fa) helps your body make more red blood cells. It is used to treat anemia caused by chronic kidney failure and chemotherapy. This medicine may be used for other purposes; ask your health care provider or pharmacist if you have questions. COMMON BRAND NAME(S): Aranesp What should I tell my health care provider before I take this medicine? They need to know if you have any of these conditions: -blood clotting disorders or history of blood clots -cancer patient not on chemotherapy -cystic fibrosis -heart disease, such as angina, heart failure, or a history of a heart attack -hemoglobin level of 12 g/dL or greater -high blood pressure -low levels of folate, iron, or vitamin B12 -seizures -an unusual or allergic reaction to darbepoetin, erythropoietin, albumin, hamster proteins, latex, other medicines, foods, dyes, or preservatives -pregnant or trying to get pregnant -breast-feeding How should I use this medicine? This medicine is for injection into a vein or under the skin. It is usually given by a health care professional in a hospital or clinic setting. If you get this medicine at home, you will be taught how to prepare and give this medicine. Do not shake the solution before you withdraw a dose. Use exactly as directed. Take your medicine at regular intervals. Do not take your medicine more often than directed. It is important that you put your used needles and syringes in a special sharps container. Do not put them in a trash can. If you do not have a sharps container, call your pharmacist or healthcare provider to get one. Talk to your pediatrician regarding the use of this medicine in children. While this medicine may be used in children as young as 1 year for selected conditions, precautions do apply. Overdosage: If you think you have taken too much of this medicine contact a poison control center or  emergency room at once. NOTE: This medicine is only for you. Do not share this medicine with others. What if I miss a dose? If you miss a dose, take it as soon as you can. If it is almost time for your next dose, take only that dose. Do not take double or extra doses. What may interact with this medicine? Do not take this medicine with any of the following medications: -epoetin alfa This list may not describe all possible interactions. Give your health care provider a list of all the medicines, herbs, non-prescription drugs, or dietary supplements you use. Also tell them if you smoke, drink alcohol, or use illegal drugs. Some items may interact with your medicine. What should I watch for while using this medicine? Visit your prescriber or health care professional for regular checks on your progress and for the needed blood tests and blood pressure measurements. It is especially important for the doctor to make sure your hemoglobin level is in the desired range, to limit the risk of potential side effects and to give you the best benefit. Keep all appointments for any recommended tests. Check your blood pressure as directed. Ask your doctor what your blood pressure should be and when you should contact him or her. As your body makes more red blood cells, you may need to take iron, folic acid, or vitamin B supplements. Ask your doctor or health care provider which products are right for you. If you have kidney disease continue dietary restrictions, even though this medication can make you feel better. Talk with your doctor or health   care professional about the foods you eat and the vitamins that you take. What side effects may I notice from receiving this medicine? Side effects that you should report to your doctor or health care professional as soon as possible: -allergic reactions like skin rash, itching or hives, swelling of the face, lips, or tongue -breathing problems -changes in vision -chest  pain -confusion, trouble speaking or understanding -feeling faint or lightheaded, falls -high blood pressure -muscle aches or pains -pain, swelling, warmth in the leg -rapid weight gain -severe headaches -sudden numbness or weakness of the face, arm or leg -trouble walking, dizziness, loss of balance or coordination -seizures (convulsions) -swelling of the ankles, feet, hands -unusually weak or tired Side effects that usually do not require medical attention (report to your doctor or health care professional if they continue or are bothersome): -diarrhea -fever, chills (flu-like symptoms) -headaches -nausea, vomiting -redness, stinging, or swelling at site where injected This list may not describe all possible side effects. Call your doctor for medical advice about side effects. You may report side effects to FDA at 1-800-FDA-1088. Where should I keep my medicine? Keep out of the reach of children. Store in a refrigerator between 2 and 8 degrees C (36 and 46 degrees F). Do not freeze. Do not shake. Throw away any unused portion if using a single-dose vial. Throw away any unused medicine after the expiration date. NOTE: This sheet is a summary. It may not cover all possible information. If you have questions about this medicine, talk to your doctor, pharmacist, or health care provider.  2015, [employer]vier/[employer] Standard. (2008-06-09 10:23:57)  

## 2014-06-18 ENCOUNTER — Ambulatory Visit: Payer: Medicare Other

## 2014-06-18 ENCOUNTER — Other Ambulatory Visit: Payer: Medicare Other

## 2014-06-18 ENCOUNTER — Encounter (HOSPITAL_COMMUNITY): Payer: Self-pay | Admitting: Internal Medicine

## 2014-06-25 ENCOUNTER — Other Ambulatory Visit (HOSPITAL_BASED_OUTPATIENT_CLINIC_OR_DEPARTMENT_OTHER): Payer: Medicare Other

## 2014-06-25 ENCOUNTER — Ambulatory Visit (HOSPITAL_BASED_OUTPATIENT_CLINIC_OR_DEPARTMENT_OTHER): Payer: Medicare Other

## 2014-06-25 DIAGNOSIS — D649 Anemia, unspecified: Secondary | ICD-10-CM

## 2014-06-25 DIAGNOSIS — D631 Anemia in chronic kidney disease: Secondary | ICD-10-CM

## 2014-06-25 DIAGNOSIS — N183 Chronic kidney disease, stage 3 (moderate): Secondary | ICD-10-CM

## 2014-06-25 DIAGNOSIS — D469 Myelodysplastic syndrome, unspecified: Secondary | ICD-10-CM

## 2014-06-25 DIAGNOSIS — D630 Anemia in neoplastic disease: Secondary | ICD-10-CM

## 2014-06-25 LAB — CBC & DIFF AND RETIC
BASO%: 0.4 % (ref 0.0–2.0)
Basophils Absolute: 0 10*3/uL (ref 0.0–0.1)
EOS%: 3.1 % (ref 0.0–7.0)
Eosinophils Absolute: 0.2 10*3/uL (ref 0.0–0.5)
HEMATOCRIT: 31.5 % — AB (ref 38.4–49.9)
HEMOGLOBIN: 10.4 g/dL — AB (ref 13.0–17.1)
IMMATURE RETIC FRACT: 7.6 % (ref 3.00–10.60)
LYMPH#: 1.2 10*3/uL (ref 0.9–3.3)
LYMPH%: 21.2 % (ref 14.0–49.0)
MCH: 34.1 pg — ABNORMAL HIGH (ref 27.2–33.4)
MCHC: 33 g/dL (ref 32.0–36.0)
MCV: 103.3 fL — ABNORMAL HIGH (ref 79.3–98.0)
MONO#: 0.6 10*3/uL (ref 0.1–0.9)
MONO%: 10.8 % (ref 0.0–14.0)
NEUT#: 3.5 10*3/uL (ref 1.5–6.5)
NEUT%: 64.5 % (ref 39.0–75.0)
Platelets: 76 10*3/uL — ABNORMAL LOW (ref 140–400)
RBC: 3.05 10*6/uL — ABNORMAL LOW (ref 4.20–5.82)
RDW: 25.1 % — ABNORMAL HIGH (ref 11.0–14.6)
RETIC CT ABS: 69.54 10*3/uL (ref 34.80–93.90)
Retic %: 2.28 % — ABNORMAL HIGH (ref 0.80–1.80)
WBC: 5.5 10*3/uL (ref 4.0–10.3)

## 2014-06-25 LAB — TECHNOLOGIST REVIEW

## 2014-06-25 MED ORDER — DARBEPOETIN ALFA 500 MCG/ML IJ SOSY
500.0000 ug | PREFILLED_SYRINGE | Freq: Once | INTRAMUSCULAR | Status: AC
  Administered 2014-06-25: 500 ug via SUBCUTANEOUS
  Filled 2014-06-25: qty 1

## 2014-06-25 NOTE — Patient Instructions (Signed)
Darbepoetin Alfa injection What is this medicine? DARBEPOETIN ALFA (dar be POE e tin AL fa) helps your body make more red blood cells. It is used to treat anemia caused by chronic kidney failure and chemotherapy. This medicine may be used for other purposes; ask your health care provider or pharmacist if you have questions. COMMON BRAND NAME(S): Aranesp What should I tell my health care provider before I take this medicine? They need to know if you have any of these conditions: -blood clotting disorders or history of blood clots -cancer patient not on chemotherapy -cystic fibrosis -heart disease, such as angina, heart failure, or a history of a heart attack -hemoglobin level of 12 g/dL or greater -high blood pressure -low levels of folate, iron, or vitamin B12 -seizures -an unusual or allergic reaction to darbepoetin, erythropoietin, albumin, hamster proteins, latex, other medicines, foods, dyes, or preservatives -pregnant or trying to get pregnant -breast-feeding How should I use this medicine? This medicine is for injection into a vein or under the skin. It is usually given by a health care professional in a hospital or clinic setting. If you get this medicine at home, you will be taught how to prepare and give this medicine. Do not shake the solution before you withdraw a dose. Use exactly as directed. Take your medicine at regular intervals. Do not take your medicine more often than directed. It is important that you put your used needles and syringes in a special sharps container. Do not put them in a trash can. If you do not have a sharps container, call your pharmacist or healthcare provider to get one. Talk to your pediatrician regarding the use of this medicine in children. While this medicine may be used in children as young as 1 year for selected conditions, precautions do apply. Overdosage: If you think you have taken too much of this medicine contact a poison control center or  emergency room at once. NOTE: This medicine is only for you. Do not share this medicine with others. What if I miss a dose? If you miss a dose, take it as soon as you can. If it is almost time for your next dose, take only that dose. Do not take double or extra doses. What may interact with this medicine? Do not take this medicine with any of the following medications: -epoetin alfa This list may not describe all possible interactions. Give your health care provider a list of all the medicines, herbs, non-prescription drugs, or dietary supplements you use. Also tell them if you smoke, drink alcohol, or use illegal drugs. Some items may interact with your medicine. What should I watch for while using this medicine? Visit your prescriber or health care professional for regular checks on your progress and for the needed blood tests and blood pressure measurements. It is especially important for the doctor to make sure your hemoglobin level is in the desired range, to limit the risk of potential side effects and to give you the best benefit. Keep all appointments for any recommended tests. Check your blood pressure as directed. Ask your doctor what your blood pressure should be and when you should contact him or her. As your body makes more red blood cells, you may need to take iron, folic acid, or vitamin B supplements. Ask your doctor or health care provider which products are right for you. If you have kidney disease continue dietary restrictions, even though this medication can make you feel better. Talk with your doctor or health   care professional about the foods you eat and the vitamins that you take. What side effects may I notice from receiving this medicine? Side effects that you should report to your doctor or health care professional as soon as possible: -allergic reactions like skin rash, itching or hives, swelling of the face, lips, or tongue -breathing problems -changes in vision -chest  pain -confusion, trouble speaking or understanding -feeling faint or lightheaded, falls -high blood pressure -muscle aches or pains -pain, swelling, warmth in the leg -rapid weight gain -severe headaches -sudden numbness or weakness of the face, arm or leg -trouble walking, dizziness, loss of balance or coordination -seizures (convulsions) -swelling of the ankles, feet, hands -unusually weak or tired Side effects that usually do not require medical attention (report to your doctor or health care professional if they continue or are bothersome): -diarrhea -fever, chills (flu-like symptoms) -headaches -nausea, vomiting -redness, stinging, or swelling at site where injected This list may not describe all possible side effects. Call your doctor for medical advice about side effects. You may report side effects to FDA at 1-800-FDA-1088. Where should I keep my medicine? Keep out of the reach of children. Store in a refrigerator between 2 and 8 degrees C (36 and 46 degrees F). Do not freeze. Do not shake. Throw away any unused portion if using a single-dose vial. Throw away any unused medicine after the expiration date. NOTE: This sheet is a summary. It may not cover all possible information. If you have questions about this medicine, talk to your doctor, pharmacist, or health care provider.  2015, [employer]vier/[employer] Standard. (2008-06-09 10:23:57)  

## 2014-07-09 ENCOUNTER — Ambulatory Visit (HOSPITAL_BASED_OUTPATIENT_CLINIC_OR_DEPARTMENT_OTHER): Payer: Medicare Other | Admitting: Hematology and Oncology

## 2014-07-09 ENCOUNTER — Telehealth: Payer: Self-pay | Admitting: Hematology and Oncology

## 2014-07-09 ENCOUNTER — Encounter: Payer: Self-pay | Admitting: Hematology and Oncology

## 2014-07-09 ENCOUNTER — Other Ambulatory Visit (HOSPITAL_BASED_OUTPATIENT_CLINIC_OR_DEPARTMENT_OTHER): Payer: Medicare Other

## 2014-07-09 ENCOUNTER — Ambulatory Visit: Payer: Medicare Other

## 2014-07-09 ENCOUNTER — Other Ambulatory Visit: Payer: Medicare Other

## 2014-07-09 VITALS — BP 122/55 | HR 85 | Temp 97.5°F | Resp 18 | Ht 68.0 in | Wt 156.4 lb

## 2014-07-09 DIAGNOSIS — D649 Anemia, unspecified: Secondary | ICD-10-CM

## 2014-07-09 DIAGNOSIS — D469 Myelodysplastic syndrome, unspecified: Secondary | ICD-10-CM

## 2014-07-09 LAB — CBC & DIFF AND RETIC
BASO%: 0.5 % (ref 0.0–2.0)
BASOS ABS: 0 10*3/uL (ref 0.0–0.1)
EOS ABS: 0.2 10*3/uL (ref 0.0–0.5)
EOS%: 2.7 % (ref 0.0–7.0)
HCT: 31.9 % — ABNORMAL LOW (ref 38.4–49.9)
HEMOGLOBIN: 10.6 g/dL — AB (ref 13.0–17.1)
Immature Retic Fract: 7.3 % (ref 3.00–10.60)
LYMPH#: 1.4 10*3/uL (ref 0.9–3.3)
LYMPH%: 26.2 % (ref 14.0–49.0)
MCH: 33.8 pg — AB (ref 27.2–33.4)
MCHC: 33.2 g/dL (ref 32.0–36.0)
MCV: 101.6 fL — ABNORMAL HIGH (ref 79.3–98.0)
MONO#: 0.5 10*3/uL (ref 0.1–0.9)
MONO%: 8.6 % (ref 0.0–14.0)
NEUT%: 62 % (ref 39.0–75.0)
NEUTROS ABS: 3.4 10*3/uL (ref 1.5–6.5)
Platelets: 70 10*3/uL — ABNORMAL LOW (ref 140–400)
RBC: 3.14 10*6/uL — ABNORMAL LOW (ref 4.20–5.82)
RDW: 23.4 % — ABNORMAL HIGH (ref 11.0–14.6)
RETIC CT ABS: 57.46 10*3/uL (ref 34.80–93.90)
Retic %: 1.83 % — ABNORMAL HIGH (ref 0.80–1.80)
WBC: 5.5 10*3/uL (ref 4.0–10.3)

## 2014-07-09 LAB — TECHNOLOGIST REVIEW

## 2014-07-09 NOTE — Telephone Encounter (Signed)
gv pt appt schedule for jan thru june 2016

## 2014-07-09 NOTE — Progress Notes (Signed)
[facility] OFFICE PROGRESS NOTE  Patient Care Team: Merrilee Seashore, MD as PCP - General (Internal Medicine)  SUMMARY OF ONCOLOGIC HISTORY: Oncology History   MDS, low IPSS score     MDS (myelodysplastic syndrome)   12/09/2004 Bone Marrow Biopsy Bone marrow biopsy showed HYPERCELLULAR MARROW WITH DYSPOIETIC FEATURES AND RING SIDEROBLASTS,  FAVOR MYELODYSPLASTIC SYNDROME   05/26/2011 - 01/01/2014 Chemotherapy The patient was given Procrit every 3 weeks 40,000 units for anemia.   01/22/2014 -  Chemotherapy He is placed on Aranesp 300 mcg   03/26/2014 -  Chemotherapy Aranesp is increased to 500 mcg     INTERVAL HISTORY: Please see below for problem oriented charting. He feels well. Denies recent infection. The patient denies any recent signs or symptoms of bleeding such as spontaneous epistaxis, hematuria or hematochezia.   REVIEW OF SYSTEMS:   Constitutional: Denies fevers, chills or abnormal weight loss Eyes: Denies blurriness of vision Ears, nose, mouth, throat, and face: Denies mucositis or sore throat Respiratory: Denies cough, dyspnea or wheezes Cardiovascular: Denies palpitation, chest discomfort or lower extremity swelling Gastrointestinal:  Denies nausea, heartburn or change in bowel habits Skin: Denies abnormal skin rashes Lymphatics: Denies new lymphadenopathy or easy bruising Neurological:Denies numbness, tingling or new weaknesses Behavioral/Psych: Mood is stable, no new changes  All other systems were reviewed with the patient and are negative.  I have reviewed the past medical history, past surgical history, social history and family history with the patient and they are unchanged from previous note.  ALLERGIES:  is allergic to penicillins and codeine sulfate.  MEDICATIONS:  Current Outpatient Prescriptions  Medication Sig Dispense Refill  . alfuzosin (UROXATRAL) 10 MG 24 hr tablet Take 10 mg by mouth every evening.     Marland Kitchen epoetin alfa  (EPOGEN,PROCRIT) 50388 UNIT/ML injection Inject 40,000 Units into the skin every 21 ( twenty-one) days.    . furosemide (LASIX) 20 MG tablet Take 1 tablet (20 mg total) by mouth daily. 90 tablet 1  . levothyroxine (SYNTHROID, LEVOTHROID) 112 MCG tablet Take 112 mcg by mouth daily before breakfast.    . lisinopril (PRINIVIL,ZESTRIL) 10 MG tablet Take 10 mg by mouth every morning.    . Multiple Vitamin (MULTIVITAMIN WITH MINERALS) TABS Take 1 tablet by mouth every morning.    . TOPROL XL 25 MG 24 hr tablet Take 25 mg by mouth daily.     No current facility-administered medications for this visit.    PHYSICAL EXAMINATION: ECOG PERFORMANCE STATUS: 0 - Asymptomatic  Filed Vitals:   07/09/14 0928  BP: 122/55  Pulse: 85  Temp: 97.5 F (36.4 C)  Resp: 18   Filed Weights   07/09/14 0928  Weight: 156 lb 6.4 oz (70.943 kg)    GENERAL:alert, no distress and comfortable SKIN: skin color, texture, turgor are normal, no rashes or significant lesions EYES: normal, Conjunctiva are pink and non-injected, sclera clear Musculoskeletal:no cyanosis of digits and no clubbing  NEURO: alert & oriented x 3 with fluent speech, no focal motor/sensory deficits  LABORATORY DATA:  I have reviewed the data as listed    Component Value Date/Time   NA 138 01/05/2014 1159   NA 136* 11/04/2013 0247   K 4.6 01/05/2014 1159   K 4.7 11/04/2013 0247   CL 105 11/04/2013 0247   CO2 25 01/05/2014 1159   CO2 20 11/04/2013 0247   GLUCOSE 94 01/05/2014 1159   GLUCOSE 119* 11/04/2013 0247   BUN 25.0 01/05/2014 1159   BUN 38* 11/04/2013 0247  CREATININE 1.7* 01/05/2014 1159   CREATININE 1.64* 11/04/2013 0247   CALCIUM 9.3 01/05/2014 1159   CALCIUM 8.1* 11/04/2013 0247   PROT 7.1 01/05/2014 1159   PROT 7.2 10/31/2013 1245   ALBUMIN 3.7 01/05/2014 1159   ALBUMIN 3.5 10/31/2013 1245   AST 64* 01/05/2014 1159   AST 58* 10/31/2013 1245   ALT 70* 01/05/2014 1159   ALT 60* 10/31/2013 1245   ALKPHOS 254*  01/05/2014 1159   ALKPHOS 248* 10/31/2013 1245   BILITOT 0.91 01/05/2014 1159   BILITOT 1.2 10/31/2013 1245   GFRNONAA 35* 11/04/2013 0247   GFRAA 41* 11/04/2013 0247    No results found for: SPEP, UPEP  Lab Results  Component Value Date   WBC 5.5 07/09/2014   NEUTROABS 3.4 07/09/2014   HGB 10.6* 07/09/2014   HCT 31.9* 07/09/2014   MCV 101.6* 07/09/2014   PLT 70* 07/09/2014      Chemistry      Component Value Date/Time   NA 138 01/05/2014 1159   NA 136* 11/04/2013 0247   K 4.6 01/05/2014 1159   K 4.7 11/04/2013 0247   CL 105 11/04/2013 0247   CO2 25 01/05/2014 1159   CO2 20 11/04/2013 0247   BUN 25.0 01/05/2014 1159   BUN 38* 11/04/2013 0247   CREATININE 1.7* 01/05/2014 1159   CREATININE 1.64* 11/04/2013 0247      Component Value Date/Time   CALCIUM 9.3 01/05/2014 1159   CALCIUM 8.1* 11/04/2013 0247   ALKPHOS 254* 01/05/2014 1159   ALKPHOS 248* 10/31/2013 1245   AST 64* 01/05/2014 1159   AST 58* 10/31/2013 1245   ALT 70* 01/05/2014 1159   ALT 60* 10/31/2013 1245   BILITOT 0.91 01/05/2014 1159   BILITOT 1.2 10/31/2013 1245    ASSESSMENT & PLAN:  MDS (myelodysplastic syndrome) Given his multiple comorbidities, he may have an element of anemia chronic disease. At this point in time, I am hesitant to draw a bone marrow aspirate and biopsy. He has responded very well to Aranesp. I will space out his injection to every 3 weeks to keep at 500 g. We will skip injection today as his hemoglobin is almost 11 g.    No orders of the defined types were placed in this encounter.   All questions were answered. The patient knows to call the clinic with any problems, questions or concerns. No barriers to learning was detected. I spent 15 minutes counseling the patient face to face. The total time spent in the appointment was 20 minutes and more than 50% was on counseling and review of test results     [facility], [place], MD 07/09/2014 11:22 AM

## 2014-07-09 NOTE — Assessment & Plan Note (Signed)
Given his multiple comorbidities, he may have an element of anemia chronic disease. At this point in time, I am hesitant to draw a bone marrow aspirate and biopsy. He has responded very well to Aranesp. I will space out his injection to every 3 weeks to keep at 500 g. We will skip injection today as his hemoglobin is almost 11 g.

## 2014-07-18 ENCOUNTER — Telehealth: Payer: Self-pay | Admitting: Physician Assistant
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
## 2014-07-23 ENCOUNTER — Other Ambulatory Visit (HOSPITAL_BASED_OUTPATIENT_CLINIC_OR_DEPARTMENT_OTHER): Payer: Medicare Other

## 2014-07-23 ENCOUNTER — Ambulatory Visit (HOSPITAL_BASED_OUTPATIENT_CLINIC_OR_DEPARTMENT_OTHER): Payer: Medicare Other

## 2014-07-23 DIAGNOSIS — N183 Chronic kidney disease, stage 3 (moderate): Secondary | ICD-10-CM

## 2014-07-23 DIAGNOSIS — D631 Anemia in chronic kidney disease: Secondary | ICD-10-CM

## 2014-07-23 DIAGNOSIS — D630 Anemia in neoplastic disease: Secondary | ICD-10-CM

## 2014-07-23 DIAGNOSIS — D469 Myelodysplastic syndrome, unspecified: Secondary | ICD-10-CM

## 2014-07-23 LAB — CBC WITH DIFFERENTIAL/PLATELET
BASO%: 1.5 % (ref 0.0–2.0)
Basophils Absolute: 0.1 10*3/uL (ref 0.0–0.1)
EOS%: 3.7 % (ref 0.0–7.0)
Eosinophils Absolute: 0.2 10*3/uL (ref 0.0–0.5)
HCT: 29.3 % — ABNORMAL LOW (ref 38.4–49.9)
HGB: 9.2 g/dL — ABNORMAL LOW (ref 13.0–17.1)
LYMPH%: 22.3 % (ref 14.0–49.0)
MCH: 33 pg (ref 27.2–33.4)
MCHC: 31.5 g/dL — AB (ref 32.0–36.0)
MCV: 104.9 fL — ABNORMAL HIGH (ref 79.3–98.0)
MONO#: 0.4 10*3/uL (ref 0.1–0.9)
MONO%: 7 % (ref 0.0–14.0)
NEUT%: 65.5 % (ref 39.0–75.0)
NEUTROS ABS: 4.1 10*3/uL (ref 1.5–6.5)
PLATELETS: 86 10*3/uL — AB (ref 140–400)
RBC: 2.79 10*6/uL — AB (ref 4.20–5.82)
RDW: 25.2 % — ABNORMAL HIGH (ref 11.0–14.6)
WBC: 6.3 10*3/uL (ref 4.0–10.3)
lymph#: 1.4 10*3/uL (ref 0.9–3.3)

## 2014-07-23 MED ORDER — DARBEPOETIN ALFA 500 MCG/ML IJ SOSY
500.0000 ug | PREFILLED_SYRINGE | Freq: Once | INTRAMUSCULAR | Status: AC
  Administered 2014-07-23: 500 ug via SUBCUTANEOUS
  Filled 2014-07-23: qty 1

## 2014-07-30 ENCOUNTER — Ambulatory Visit: Payer: Medicare Other | Admitting: Hematology and Oncology

## 2014-07-30 ENCOUNTER — Other Ambulatory Visit: Payer: Medicare Other

## 2014-07-30 ENCOUNTER — Ambulatory Visit: Payer: Medicare Other

## 2014-08-06 ENCOUNTER — Other Ambulatory Visit: Payer: Medicare Other

## 2014-08-06 ENCOUNTER — Ambulatory Visit: Payer: Medicare Other

## 2014-08-11 ENCOUNTER — Telehealth: Payer: Self-pay | Admitting: Internal Medicine

## 2014-08-11 NOTE — Telephone Encounter (Signed)
New problem   Pt has a pacer and want to know if an electric chainsaw would interfere with his pacer.

## 2014-08-11 NOTE — Telephone Encounter (Signed)
LMTCB/SSS 

## 2014-08-12 NOTE — Telephone Encounter (Signed)
F/U         Pt would like to know if he can use his electric chainsaw with a pacemaker.  Pt would like call back.

## 2014-08-12 NOTE — Telephone Encounter (Signed)
Instructed pt how to send manual transmission. Informed pt that we highly recommended that he not use a electric chinshaw.

## 2014-08-12 NOTE — Telephone Encounter (Signed)
Follow Up  Pt wanted an explanation on how to send a remote signal for his upcoming 2/10 appt. Please call back and discuss.

## 2014-08-13 ENCOUNTER — Other Ambulatory Visit (HOSPITAL_BASED_OUTPATIENT_CLINIC_OR_DEPARTMENT_OTHER): Payer: Medicare Other

## 2014-08-13 ENCOUNTER — Ambulatory Visit (HOSPITAL_BASED_OUTPATIENT_CLINIC_OR_DEPARTMENT_OTHER): Payer: Medicare Other

## 2014-08-13 DIAGNOSIS — D469 Myelodysplastic syndrome, unspecified: Secondary | ICD-10-CM

## 2014-08-13 DIAGNOSIS — D630 Anemia in neoplastic disease: Secondary | ICD-10-CM

## 2014-08-13 DIAGNOSIS — D631 Anemia in chronic kidney disease: Secondary | ICD-10-CM

## 2014-08-13 DIAGNOSIS — N183 Chronic kidney disease, stage 3 (moderate): Secondary | ICD-10-CM

## 2014-08-13 LAB — CBC & DIFF AND RETIC
BASO%: 0.5 % (ref 0.0–2.0)
Basophils Absolute: 0 10*3/uL (ref 0.0–0.1)
EOS%: 2.7 % (ref 0.0–7.0)
Eosinophils Absolute: 0.1 10*3/uL (ref 0.0–0.5)
HCT: 26.2 % — ABNORMAL LOW (ref 38.4–49.9)
HGB: 8.7 g/dL — ABNORMAL LOW (ref 13.0–17.1)
Immature Retic Fract: 7.1 % (ref 3.00–10.60)
LYMPH%: 28 % (ref 14.0–49.0)
MCH: 33.5 pg — ABNORMAL HIGH (ref 27.2–33.4)
MCHC: 33.2 g/dL (ref 32.0–36.0)
MCV: 100.8 fL — AB (ref 79.3–98.0)
MONO#: 0.4 10*3/uL (ref 0.1–0.9)
MONO%: 9.2 % (ref 0.0–14.0)
NEUT#: 2.5 10*3/uL (ref 1.5–6.5)
NEUT%: 59.6 % (ref 39.0–75.0)
Platelets: 86 10*3/uL — ABNORMAL LOW (ref 140–400)
RBC: 2.6 10*6/uL — ABNORMAL LOW (ref 4.20–5.82)
RDW: 24 % — AB (ref 11.0–14.6)
Retic %: <0.38 % — ABNORMAL LOW (ref 0.5–1.6)
Retic Ct Abs: 6.24 10*3/uL — ABNORMAL LOW (ref 34.80–93.90)
WBC: 4.1 10*3/uL (ref 4.0–10.3)
lymph#: 1.2 10*3/uL (ref 0.9–3.3)

## 2014-08-13 MED ORDER — DARBEPOETIN ALFA 500 MCG/ML IJ SOSY
500.0000 ug | PREFILLED_SYRINGE | Freq: Once | INTRAMUSCULAR | Status: AC
  Administered 2014-08-13: 500 ug via SUBCUTANEOUS
  Filled 2014-08-13: qty 1

## 2014-08-19 ENCOUNTER — Ambulatory Visit (INDEPENDENT_AMBULATORY_CARE_PROVIDER_SITE_OTHER): Payer: Medicare Other | Admitting: *Deleted

## 2014-08-19 DIAGNOSIS — I442 Atrioventricular block, complete: Secondary | ICD-10-CM

## 2014-08-19 NOTE — Progress Notes (Signed)
Remote pacemaker transmission.   

## 2014-08-20 ENCOUNTER — Ambulatory Visit: Payer: Medicare Other

## 2014-08-20 ENCOUNTER — Other Ambulatory Visit: Payer: Medicare Other

## 2014-08-21 LAB — MDC_IDC_ENUM_SESS_TYPE_REMOTE
Battery Remaining Longevity: 116 mo
Battery Remaining Percentage: 95.5 %
Battery Voltage: 3.01 V
Brady Statistic AP VS Percent: 1 %
Brady Statistic RA Percent Paced: 40 %
Brady Statistic RV Percent Paced: 99 %
Implantable Pulse Generator Model: 2240
Implantable Pulse Generator Serial Number: 3013631
Lead Channel Impedance Value: 430 Ohm
Lead Channel Pacing Threshold Amplitude: 0.875 V
Lead Channel Pacing Threshold Pulse Width: 0.4 ms
Lead Channel Sensing Intrinsic Amplitude: 2.3 mV
Lead Channel Sensing Intrinsic Amplitude: 7.8 mV
MDC IDC MSMT LEADCHNL RV IMPEDANCE VALUE: 400 Ohm
MDC IDC MSMT LEADCHNL RV PACING THRESHOLD AMPLITUDE: 1 V
MDC IDC MSMT LEADCHNL RV PACING THRESHOLD PULSEWIDTH: 0.4 ms
MDC IDC SESS DTM: 20160210131915
MDC IDC SET LEADCHNL RA PACING AMPLITUDE: 1.875
MDC IDC SET LEADCHNL RV PACING AMPLITUDE: 1.25 V
MDC IDC SET LEADCHNL RV PACING PULSEWIDTH: 0.4 ms
MDC IDC SET LEADCHNL RV SENSING SENSITIVITY: 4 mV
MDC IDC STAT BRADY AP VP PERCENT: 43 %
MDC IDC STAT BRADY AS VP PERCENT: 57 %
MDC IDC STAT BRADY AS VS PERCENT: 1 %

## 2014-08-27 ENCOUNTER — Encounter: Payer: Self-pay | Admitting: Cardiology

## 2014-08-31 ENCOUNTER — Encounter: Payer: Self-pay | Admitting: Internal Medicine

## 2014-09-03 ENCOUNTER — Other Ambulatory Visit (HOSPITAL_BASED_OUTPATIENT_CLINIC_OR_DEPARTMENT_OTHER): Payer: Medicare Other

## 2014-09-03 ENCOUNTER — Ambulatory Visit (HOSPITAL_BASED_OUTPATIENT_CLINIC_OR_DEPARTMENT_OTHER): Payer: Medicare Other

## 2014-09-03 DIAGNOSIS — D631 Anemia in chronic kidney disease: Secondary | ICD-10-CM

## 2014-09-03 DIAGNOSIS — D630 Anemia in neoplastic disease: Secondary | ICD-10-CM

## 2014-09-03 DIAGNOSIS — N183 Chronic kidney disease, stage 3 (moderate): Secondary | ICD-10-CM

## 2014-09-03 DIAGNOSIS — D469 Myelodysplastic syndrome, unspecified: Secondary | ICD-10-CM

## 2014-09-03 LAB — CBC & DIFF AND RETIC
BASO%: 0.3 % (ref 0.0–2.0)
Basophils Absolute: 0 10*3/uL (ref 0.0–0.1)
EOS ABS: 0.2 10*3/uL (ref 0.0–0.5)
EOS%: 2.5 % (ref 0.0–7.0)
HCT: 27.1 % — ABNORMAL LOW (ref 38.4–49.9)
HGB: 8.9 g/dL — ABNORMAL LOW (ref 13.0–17.1)
Immature Retic Fract: 8.8 % (ref 3.00–10.60)
LYMPH%: 20.5 % (ref 14.0–49.0)
MCH: 33.8 pg — ABNORMAL HIGH (ref 27.2–33.4)
MCHC: 32.8 g/dL (ref 32.0–36.0)
MCV: 103 fL — AB (ref 79.3–98.0)
MONO#: 0.5 10*3/uL (ref 0.1–0.9)
MONO%: 8.8 % (ref 0.0–14.0)
NEUT#: 4 10*3/uL (ref 1.5–6.5)
NEUT%: 67.9 % (ref 39.0–75.0)
Platelets: 70 10*3/uL — ABNORMAL LOW (ref 140–400)
RBC: 2.63 10*6/uL — ABNORMAL LOW (ref 4.20–5.82)
RDW: 26 % — ABNORMAL HIGH (ref 11.0–14.6)
Retic %: <0.38 % — ABNORMAL LOW (ref 0.5–1.6)
Retic Ct Abs: 8.68 10*3/uL — ABNORMAL LOW (ref 34.80–93.90)
WBC: 5.9 10*3/uL (ref 4.0–10.3)
lymph#: 1.2 10*3/uL (ref 0.9–3.3)

## 2014-09-03 MED ORDER — DARBEPOETIN ALFA 500 MCG/ML IJ SOSY
500.0000 ug | PREFILLED_SYRINGE | Freq: Once | INTRAMUSCULAR | Status: AC
  Administered 2014-09-03: 500 ug via SUBCUTANEOUS
  Filled 2014-09-03: qty 1

## 2014-09-03 NOTE — Patient Instructions (Signed)
Darbepoetin Alfa injection What is this medicine? DARBEPOETIN ALFA (dar be POE e tin AL fa) helps your body make more red blood cells. It is used to treat anemia caused by chronic kidney failure and chemotherapy. This medicine may be used for other purposes; ask your health care provider or pharmacist if you have questions. COMMON BRAND NAME(S): Aranesp What should I tell my health care provider before I take this medicine? They need to know if you have any of these conditions: -blood clotting disorders or history of blood clots -cancer patient not on chemotherapy -cystic fibrosis -heart disease, such as angina, heart failure, or a history of a heart attack -hemoglobin level of 12 g/dL or greater -high blood pressure -low levels of folate, iron, or vitamin B12 -seizures -an unusual or allergic reaction to darbepoetin, erythropoietin, albumin, hamster proteins, latex, other medicines, foods, dyes, or preservatives -pregnant or trying to get pregnant -breast-feeding How should I use this medicine? This medicine is for injection into a vein or under the skin. It is usually given by a health care professional in a hospital or clinic setting. If you get this medicine at home, you will be taught how to prepare and give this medicine. Do not shake the solution before you withdraw a dose. Use exactly as directed. Take your medicine at regular intervals. Do not take your medicine more often than directed. It is important that you put your used needles and syringes in a special sharps container. Do not put them in a trash can. If you do not have a sharps container, call your pharmacist or healthcare provider to get one. Talk to your pediatrician regarding the use of this medicine in children. While this medicine may be used in children as young as 1 year for selected conditions, precautions do apply. Overdosage: If you think you have taken too much of this medicine contact a poison control center or  emergency room at once. NOTE: This medicine is only for you. Do not share this medicine with others. What if I miss a dose? If you miss a dose, take it as soon as you can. If it is almost time for your next dose, take only that dose. Do not take double or extra doses. What may interact with this medicine? Do not take this medicine with any of the following medications: -epoetin alfa This list may not describe all possible interactions. Give your health care provider a list of all the medicines, herbs, non-prescription drugs, or dietary supplements you use. Also tell them if you smoke, drink alcohol, or use illegal drugs. Some items may interact with your medicine. What should I watch for while using this medicine? Visit your prescriber or health care professional for regular checks on your progress and for the needed blood tests and blood pressure measurements. It is especially important for the doctor to make sure your hemoglobin level is in the desired range, to limit the risk of potential side effects and to give you the best benefit. Keep all appointments for any recommended tests. Check your blood pressure as directed. Ask your doctor what your blood pressure should be and when you should contact him or her. As your body makes more red blood cells, you may need to take iron, folic acid, or vitamin B supplements. Ask your doctor or health care provider which products are right for you. If you have kidney disease continue dietary restrictions, even though this medication can make you feel better. Talk with your doctor or health   care professional about the foods you eat and the vitamins that you take. What side effects may I notice from receiving this medicine? Side effects that you should report to your doctor or health care professional as soon as possible: -allergic reactions like skin rash, itching or hives, swelling of the face, lips, or tongue -breathing problems -changes in vision -chest  pain -confusion, trouble speaking or understanding -feeling faint or lightheaded, falls -high blood pressure -muscle aches or pains -pain, swelling, warmth in the leg -rapid weight gain -severe headaches -sudden numbness or weakness of the face, arm or leg -trouble walking, dizziness, loss of balance or coordination -seizures (convulsions) -swelling of the ankles, feet, hands -unusually weak or tired Side effects that usually do not require medical attention (report to your doctor or health care professional if they continue or are bothersome): -diarrhea -fever, chills (flu-like symptoms) -headaches -nausea, vomiting -redness, stinging, or swelling at site where injected This list may not describe all possible side effects. Call your doctor for medical advice about side effects. You may report side effects to FDA at 1-800-FDA-1088. Where should I keep my medicine? Keep out of the reach of children. Store in a refrigerator between 2 and 8 degrees C (36 and 46 degrees F). Do not freeze. Do not shake. Throw away any unused portion if using a single-dose vial. Throw away any unused medicine after the expiration date. NOTE: This sheet is a summary. It may not cover all possible information. If you have questions about this medicine, talk to your doctor, pharmacist, or health care provider.  2015, [employer]vier/[employer] Standard. (2008-06-09 10:23:57)  

## 2014-09-10 ENCOUNTER — Other Ambulatory Visit: Payer: Medicare Other

## 2014-09-10 ENCOUNTER — Ambulatory Visit: Payer: Medicare Other

## 2014-09-10 ENCOUNTER — Encounter: Payer: Self-pay | Admitting: Cardiology

## 2014-09-10 ENCOUNTER — Ambulatory Visit: Payer: Medicare Other | Admitting: Oncology

## 2014-09-24 ENCOUNTER — Ambulatory Visit (HOSPITAL_BASED_OUTPATIENT_CLINIC_OR_DEPARTMENT_OTHER): Payer: Medicare Other

## 2014-09-24 ENCOUNTER — Other Ambulatory Visit (HOSPITAL_BASED_OUTPATIENT_CLINIC_OR_DEPARTMENT_OTHER): Payer: Medicare Other

## 2014-09-24 DIAGNOSIS — D631 Anemia in chronic kidney disease: Secondary | ICD-10-CM

## 2014-09-24 DIAGNOSIS — D630 Anemia in neoplastic disease: Secondary | ICD-10-CM

## 2014-09-24 DIAGNOSIS — D469 Myelodysplastic syndrome, unspecified: Secondary | ICD-10-CM

## 2014-09-24 DIAGNOSIS — N183 Chronic kidney disease, stage 3 (moderate): Secondary | ICD-10-CM

## 2014-09-24 LAB — CBC & DIFF AND RETIC
BASO%: 0.7 % (ref 0.0–2.0)
BASOS ABS: 0 10*3/uL (ref 0.0–0.1)
EOS%: 3.3 % (ref 0.0–7.0)
Eosinophils Absolute: 0.2 10*3/uL (ref 0.0–0.5)
HEMATOCRIT: 29.6 % — AB (ref 38.4–49.9)
HGB: 9.6 g/dL — ABNORMAL LOW (ref 13.0–17.1)
Immature Retic Fract: 4.1 % (ref 3.00–10.60)
LYMPH%: 24.2 % (ref 14.0–49.0)
MCH: 33.4 pg (ref 27.2–33.4)
MCHC: 32.4 g/dL (ref 32.0–36.0)
MCV: 103.1 fL — AB (ref 79.3–98.0)
MONO#: 0.7 10*3/uL (ref 0.1–0.9)
MONO%: 11.8 % (ref 0.0–14.0)
NEUT#: 3.6 10*3/uL (ref 1.5–6.5)
NEUT%: 60 % (ref 39.0–75.0)
Platelets: 102 10*3/uL — ABNORMAL LOW (ref 140–400)
RBC: 2.87 10*6/uL — AB (ref 4.20–5.82)
RDW: 24.5 % — ABNORMAL HIGH (ref 11.0–14.6)
Retic %: <0.38 % — ABNORMAL LOW (ref 0.5–1.6)
Retic Ct Abs: 8.04 10*3/uL — ABNORMAL LOW (ref 34.80–93.90)
WBC: 6 10*3/uL (ref 4.0–10.3)
lymph#: 1.5 10*3/uL (ref 0.9–3.3)

## 2014-09-24 LAB — TECHNOLOGIST REVIEW

## 2014-09-24 MED ORDER — DARBEPOETIN ALFA 500 MCG/ML IJ SOSY
500.0000 ug | PREFILLED_SYRINGE | Freq: Once | INTRAMUSCULAR | Status: AC
  Administered 2014-09-24: 500 ug via SUBCUTANEOUS
  Filled 2014-09-24: qty 1

## 2014-10-15 ENCOUNTER — Ambulatory Visit (HOSPITAL_BASED_OUTPATIENT_CLINIC_OR_DEPARTMENT_OTHER): Payer: Medicare Other

## 2014-10-15 ENCOUNTER — Other Ambulatory Visit (HOSPITAL_BASED_OUTPATIENT_CLINIC_OR_DEPARTMENT_OTHER): Payer: Medicare Other

## 2014-10-15 DIAGNOSIS — D469 Myelodysplastic syndrome, unspecified: Secondary | ICD-10-CM | POA: Diagnosis not present

## 2014-10-15 DIAGNOSIS — N183 Chronic kidney disease, stage 3 (moderate): Secondary | ICD-10-CM

## 2014-10-15 DIAGNOSIS — D631 Anemia in chronic kidney disease: Secondary | ICD-10-CM

## 2014-10-15 DIAGNOSIS — D630 Anemia in neoplastic disease: Secondary | ICD-10-CM

## 2014-10-15 LAB — CBC & DIFF AND RETIC
BASO%: 0.4 % (ref 0.0–2.0)
BASOS ABS: 0 10*3/uL (ref 0.0–0.1)
EOS ABS: 0.1 10*3/uL (ref 0.0–0.5)
EOS%: 2 % (ref 0.0–7.0)
HCT: 28.9 % — ABNORMAL LOW (ref 38.4–49.9)
HEMOGLOBIN: 9.5 g/dL — AB (ref 13.0–17.1)
Immature Retic Fract: 1.5 % — ABNORMAL LOW (ref 3.00–10.60)
LYMPH#: 1.3 10*3/uL (ref 0.9–3.3)
LYMPH%: 24.7 % (ref 14.0–49.0)
MCH: 34.2 pg — AB (ref 27.2–33.4)
MCHC: 32.9 g/dL (ref 32.0–36.0)
MCV: 104 fL — AB (ref 79.3–98.0)
MONO#: 0.6 10*3/uL (ref 0.1–0.9)
MONO%: 10.9 % (ref 0.0–14.0)
NEUT%: 62 % (ref 39.0–75.0)
NEUTROS ABS: 3.1 10*3/uL (ref 1.5–6.5)
Platelets: 88 10*3/uL — ABNORMAL LOW (ref 140–400)
RBC: 2.78 10*6/uL — ABNORMAL LOW (ref 4.20–5.82)
RDW: 25.1 % — AB (ref 11.0–14.6)
Retic %: <0.38 % — ABNORMAL LOW (ref 0.5–1.6)
Retic Ct Abs: 7.23 10*3/uL — ABNORMAL LOW (ref 34.80–93.90)
WBC: 5.1 10*3/uL (ref 4.0–10.3)

## 2014-10-15 LAB — TECHNOLOGIST REVIEW

## 2014-10-15 MED ORDER — DARBEPOETIN ALFA 500 MCG/ML IJ SOSY
500.0000 ug | PREFILLED_SYRINGE | Freq: Once | INTRAMUSCULAR | Status: AC
  Administered 2014-10-15: 500 ug via SUBCUTANEOUS
  Filled 2014-10-15: qty 1

## 2014-10-22 ENCOUNTER — Other Ambulatory Visit: Payer: Self-pay | Admitting: Internal Medicine

## 2014-10-22 DIAGNOSIS — R7989 Other specified abnormal findings of blood chemistry: Secondary | ICD-10-CM

## 2014-10-22 DIAGNOSIS — R945 Abnormal results of liver function studies: Secondary | ICD-10-CM

## 2014-10-22 DIAGNOSIS — R748 Abnormal levels of other serum enzymes: Secondary | ICD-10-CM

## 2014-10-22 DIAGNOSIS — K8020 Calculus of gallbladder without cholecystitis without obstruction: Secondary | ICD-10-CM

## 2014-10-26 ENCOUNTER — Ambulatory Visit
Admission: RE | Admit: 2014-10-26 | Discharge: 2014-10-26 | Disposition: A | Discharge status: Home / Self Care | Payer: Medicare Other | Source: Ambulatory Visit | Attending: Internal Medicine | Admitting: Internal Medicine

## 2014-10-26 DIAGNOSIS — K8020 Calculus of gallbladder without cholecystitis without obstruction: Secondary | ICD-10-CM

## 2014-10-26 DIAGNOSIS — R7989 Other specified abnormal findings of blood chemistry: Secondary | ICD-10-CM

## 2014-10-26 DIAGNOSIS — R748 Abnormal levels of other serum enzymes: Secondary | ICD-10-CM

## 2014-10-26 DIAGNOSIS — R945 Abnormal results of liver function studies: Secondary | ICD-10-CM

## 2014-10-26 MED ORDER — IOPAMIDOL (ISOVUE-300) INJECTION 61%
60.0000 mL | Freq: Once | INTRAVENOUS | Status: AC | PRN
  Administered 2014-10-26: 60 mL via INTRAVENOUS

## 2014-11-03 ENCOUNTER — Telehealth: Payer: Self-pay | Admitting: Hematology and Oncology

## 2014-11-03 NOTE — Telephone Encounter (Signed)
returned call and s,w, pt dtr and sched MD visit...done...pt ok and aware

## 2014-11-05 ENCOUNTER — Other Ambulatory Visit (HOSPITAL_BASED_OUTPATIENT_CLINIC_OR_DEPARTMENT_OTHER): Payer: Medicare Other

## 2014-11-05 ENCOUNTER — Ambulatory Visit (HOSPITAL_BASED_OUTPATIENT_CLINIC_OR_DEPARTMENT_OTHER): Payer: Medicare Other

## 2014-11-05 VITALS — BP 133/57 | HR 60 | Temp 98.1°F

## 2014-11-05 DIAGNOSIS — D469 Myelodysplastic syndrome, unspecified: Secondary | ICD-10-CM | POA: Diagnosis not present

## 2014-11-05 DIAGNOSIS — N183 Chronic kidney disease, stage 3 (moderate): Secondary | ICD-10-CM

## 2014-11-05 DIAGNOSIS — D630 Anemia in neoplastic disease: Secondary | ICD-10-CM

## 2014-11-05 DIAGNOSIS — D631 Anemia in chronic kidney disease: Secondary | ICD-10-CM

## 2014-11-05 LAB — CBC & DIFF AND RETIC
BASO%: 0.5 % (ref 0.0–2.0)
BASOS ABS: 0 10*3/uL (ref 0.0–0.1)
EOS%: 2.1 % (ref 0.0–7.0)
Eosinophils Absolute: 0.1 10*3/uL (ref 0.0–0.5)
HEMATOCRIT: 26.8 % — AB (ref 38.4–49.9)
HGB: 8.8 g/dL — ABNORMAL LOW (ref 13.0–17.1)
IMMATURE RETIC FRACT: 16.5 % — AB (ref 3.00–10.60)
LYMPH#: 1.2 10*3/uL (ref 0.9–3.3)
LYMPH%: 18.9 % (ref 14.0–49.0)
MCH: 33.1 pg (ref 27.2–33.4)
MCHC: 32.8 g/dL (ref 32.0–36.0)
MCV: 100.8 fL — AB (ref 79.3–98.0)
MONO#: 0.7 10*3/uL (ref 0.1–0.9)
MONO%: 10.9 % (ref 0.0–14.0)
NEUT#: 4.1 10*3/uL (ref 1.5–6.5)
NEUT%: 67.6 % (ref 39.0–75.0)
Platelets: 80 10*3/uL — ABNORMAL LOW (ref 140–400)
RBC: 2.66 10*6/uL — ABNORMAL LOW (ref 4.20–5.82)
RDW: 24.4 % — ABNORMAL HIGH (ref 11.0–14.6)
Retic %: <0.38 % — ABNORMAL LOW (ref 0.5–1.6)
Retic Ct Abs: 9.84 10*3/uL — ABNORMAL LOW (ref 34.80–93.90)
WBC: 6.1 10*3/uL (ref 4.0–10.3)

## 2014-11-05 MED ORDER — DARBEPOETIN ALFA 500 MCG/ML IJ SOSY
500.0000 ug | PREFILLED_SYRINGE | Freq: Once | INTRAMUSCULAR | Status: AC
  Administered 2014-11-05: 500 ug via SUBCUTANEOUS
  Filled 2014-11-05: qty 1

## 2014-11-09 ENCOUNTER — Ambulatory Visit: Payer: Medicare Other | Admitting: Hematology and Oncology

## 2014-11-12 ENCOUNTER — Other Ambulatory Visit: Payer: Self-pay | Admitting: Internal Medicine

## 2014-11-13 ENCOUNTER — Ambulatory Visit (HOSPITAL_BASED_OUTPATIENT_CLINIC_OR_DEPARTMENT_OTHER): Payer: Medicare Other | Admitting: Hematology and Oncology

## 2014-11-13 ENCOUNTER — Telehealth: Payer: Self-pay | Admitting: Hematology and Oncology

## 2014-11-13 ENCOUNTER — Encounter: Payer: Self-pay | Admitting: Hematology and Oncology

## 2014-11-13 VITALS — BP 129/63 | HR 56 | Temp 97.9°F | Resp 18 | Ht 68.0 in | Wt 156.7 lb

## 2014-11-13 DIAGNOSIS — I713 Abdominal aortic aneurysm, ruptured, unspecified: Secondary | ICD-10-CM

## 2014-11-13 DIAGNOSIS — I719 Aortic aneurysm of unspecified site, without rupture: Secondary | ICD-10-CM

## 2014-11-13 DIAGNOSIS — R161 Splenomegaly, not elsewhere classified: Secondary | ICD-10-CM | POA: Diagnosis not present

## 2014-11-13 DIAGNOSIS — D469 Myelodysplastic syndrome, unspecified: Secondary | ICD-10-CM

## 2014-11-13 DIAGNOSIS — R5383 Other fatigue: Secondary | ICD-10-CM

## 2014-11-13 DIAGNOSIS — I714 Abdominal aortic aneurysm, without rupture: Secondary | ICD-10-CM | POA: Insufficient documentation

## 2014-11-13 DIAGNOSIS — D696 Thrombocytopenia, unspecified: Secondary | ICD-10-CM | POA: Diagnosis not present

## 2014-11-13 NOTE — Telephone Encounter (Signed)
Gave avs & calendar for May thru November.

## 2014-11-13 NOTE — Assessment & Plan Note (Signed)
Given his multiple comorbidities, he may have an element of anemia chronic disease, likely myelodysplastic syndrome with intermittent bleeding At this point in time, I am hesitant to draw a bone marrow aspirate and biopsy. He has responded very well to Aranesp until recently which I suspect the drop of hemoglobin and platelet was due to consumption I plan to increase his injection to 500 g every 2 weeks to keep hemoglobin above 11 g.

## 2014-11-13 NOTE — Assessment & Plan Note (Signed)
The cause of the splenomegaly is likely related to the underlying myelodysplastic syndrome. Low-grade lymphoma cannot be excluded. The present time, he can be observed. This is mostly the cause of the thrombocytopenia.

## 2014-11-13 NOTE — Assessment & Plan Note (Signed)
The most likely cause of this is due to consumption from bleeding. Low-grade myelodysplastic syndrome can also cause, cytopenia. The present time, he can be observed as long as platelet count is greater than 50,000.

## 2014-11-13 NOTE — Progress Notes (Signed)
[facility] OFFICE PROGRESS NOTE  Patient Care Team: Merrilee Seashore, MD as PCP - General (Internal Medicine)  SUMMARY OF ONCOLOGIC HISTORY: Oncology History   MDS, low IPSS score     MDS (myelodysplastic syndrome)   12/09/2004 Bone Marrow Biopsy Bone marrow biopsy showed HYPERCELLULAR MARROW WITH DYSPOIETIC FEATURES AND RING SIDEROBLASTS,  FAVOR MYELODYSPLASTIC SYNDROME   05/26/2011 - 01/01/2014 Chemotherapy The patient was given Procrit every 3 weeks 40,000 units for anemia.   01/22/2014 -  Chemotherapy He is placed on Aranesp 300 mcg   03/26/2014 -  Chemotherapy Aranesp is increased to 500 mcg    INTERVAL HISTORY: Please see below for problem oriented charting. He is seen for further follow-up. Recently, he complained of intermittent abdominal pain. Denies CT scan done recently shows splenomegaly and mild leak around the aneurysm. The patient denies any recent signs or symptoms of bleeding such as spontaneous epistaxis, hematuria or hematochezia. He complained of mild fatigue.  REVIEW OF SYSTEMS:   Constitutional: Denies fevers, chills or abnormal weight loss Eyes: Denies blurriness of vision Ears, nose, mouth, throat, and face: Denies mucositis or sore throat Respiratory: Denies cough, dyspnea or wheezes Cardiovascular: Denies palpitation, chest discomfort or lower extremity swelling Gastrointestinal:  Denies nausea, heartburn or change in bowel habits Skin: Denies abnormal skin rashes Lymphatics: Denies new lymphadenopathy or easy bruising Neurological:Denies numbness, tingling or new weaknesses Behavioral/Psych: Mood is stable, no new changes  All other systems were reviewed with the patient and are negative.  I have reviewed the past medical history, past surgical history, social history and family history with the patient and they are unchanged from previous note.  ALLERGIES:  is allergic to penicillins and codeine sulfate.  MEDICATIONS:  Current  Outpatient Prescriptions  Medication Sig Dispense Refill  . alfuzosin (UROXATRAL) 10 MG 24 hr tablet Take 10 mg by mouth every evening.     . furosemide (LASIX) 20 MG tablet TAKE 1 TABLET DAILY 90 tablet 0  . levothyroxine (SYNTHROID, LEVOTHROID) 112 MCG tablet Take 112 mcg by mouth daily before breakfast.    . lisinopril (PRINIVIL,ZESTRIL) 10 MG tablet Take 10 mg by mouth every morning.    . Multiple Vitamin (MULTIVITAMIN WITH MINERALS) TABS Take 1 tablet by mouth every morning.    . TOPROL XL 25 MG 24 hr tablet Take 25 mg by mouth daily.     No current facility-administered medications for this visit.    PHYSICAL EXAMINATION: ECOG PERFORMANCE STATUS: 1 - Symptomatic but completely ambulatory  Filed Vitals:   11/13/14 0947  BP: 129/63  Pulse: 56  Temp: 97.9 F (36.6 C)  Resp: 18   Filed Weights   11/13/14 0947  Weight: 156 lb 11.2 oz (71.079 kg)    GENERAL:alert, no distress and comfortable SKIN: skin color, texture, turgor are normal, no rashes or significant lesions EYES: normal, Conjunctiva are pink and non-injected, sclera clear OROPHARYNX:no exudate, no erythema and lips, buccal mucosa, and tongue normal  ABDOMEN:abdomen soft, non-tender and normal bowel sounds Musculoskeletal:no cyanosis of digits and no clubbing  NEURO: alert & oriented x 3 with fluent speech, no focal motor/sensory deficits  LABORATORY DATA:  I have reviewed the data as listed    Component Value Date/Time   NA 138 01/05/2014 1159   NA 136* 11/04/2013 0247   K 4.6 01/05/2014 1159   K 4.7 11/04/2013 0247   CL 105 11/04/2013 0247   CO2 25 01/05/2014 1159   CO2 20 11/04/2013 0247   GLUCOSE 94 01/05/2014  1159   GLUCOSE 119* 11/04/2013 0247   BUN 25.0 01/05/2014 1159   BUN 38* 11/04/2013 0247   CREATININE 1.7* 01/05/2014 1159   CREATININE 1.64* 11/04/2013 0247   CALCIUM 9.3 01/05/2014 1159   CALCIUM 8.1* 11/04/2013 0247   PROT 7.1 01/05/2014 1159   PROT 7.2 10/31/2013 1245   ALBUMIN 3.7  01/05/2014 1159   ALBUMIN 3.5 10/31/2013 1245   AST 64* 01/05/2014 1159   AST 58* 10/31/2013 1245   ALT 70* 01/05/2014 1159   ALT 60* 10/31/2013 1245   ALKPHOS 254* 01/05/2014 1159   ALKPHOS 248* 10/31/2013 1245   BILITOT 0.91 01/05/2014 1159   BILITOT 1.2 10/31/2013 1245   GFRNONAA 35* 11/04/2013 0247   GFRAA 41* 11/04/2013 0247    No results found for: SPEP, UPEP  Lab Results  Component Value Date   WBC 6.1 11/05/2014   NEUTROABS 4.1 11/05/2014   HGB 8.8* 11/05/2014   HCT 26.8* 11/05/2014   MCV 100.8* 11/05/2014   PLT 80* 11/05/2014      Chemistry      Component Value Date/Time   NA 138 01/05/2014 1159   NA 136* 11/04/2013 0247   K 4.6 01/05/2014 1159   K 4.7 11/04/2013 0247   CL 105 11/04/2013 0247   CO2 25 01/05/2014 1159   CO2 20 11/04/2013 0247   BUN 25.0 01/05/2014 1159   BUN 38* 11/04/2013 0247   CREATININE 1.7* 01/05/2014 1159   CREATININE 1.64* 11/04/2013 0247      Component Value Date/Time   CALCIUM 9.3 01/05/2014 1159   CALCIUM 8.1* 11/04/2013 0247   ALKPHOS 254* 01/05/2014 1159   ALKPHOS 248* 10/31/2013 1245   AST 64* 01/05/2014 1159   AST 58* 10/31/2013 1245   ALT 70* 01/05/2014 1159   ALT 60* 10/31/2013 1245   BILITOT 0.91 01/05/2014 1159   BILITOT 1.2 10/31/2013 1245       RADIOGRAPHIC STUDIES:I reviewed the most recent CT scan with him and his son I have personally reviewed the radiological images as listed and agreed with the findings in the report.  ASSESSMENT & PLAN:  MDS (myelodysplastic syndrome) Given his multiple comorbidities, he may have an element of anemia chronic disease, likely myelodysplastic syndrome with intermittent bleeding At this point in time, I am hesitant to draw a bone marrow aspirate and biopsy. He has responded very well to Aranesp until recently which I suspect the drop of hemoglobin and platelet was due to consumption I plan to increase his injection to 500 g every 2 weeks to keep hemoglobin above 11  g.     Thrombocytopenia The most likely cause of this is due to consumption from bleeding. Low-grade myelodysplastic syndrome can also cause, cytopenia. The present time, he can be observed as long as platelet count is greater than 50,000.    Leaking abdominal aortic aneurysm I am concerned about the most recent CT scan report which show evidence of leak around the aneurysm. I will refer him to vascular surgery for consultation for management of this.   Splenomegaly The cause of the splenomegaly is likely related to the underlying myelodysplastic syndrome. Low-grade lymphoma cannot be excluded. The present time, he can be observed. This is mostly the cause of the thrombocytopenia.    Orders Placed This Encounter  Procedures  . Vitamin B12    Standing Status: Future     Number of Occurrences:      Standing Expiration Date: 12/18/2015  . Ferritin    Standing Status: Future  Number of Occurrences:      Standing Expiration Date: 12/18/2015  . Iron and TIBC    Standing Status: Future     Number of Occurrences:      Standing Expiration Date: 12/18/2015  . Erythropoietin    Standing Status: Future     Number of Occurrences:      Standing Expiration Date: 12/18/2015  . Ambulatory referral to Vascular Surgery    Referral Priority:  Routine    Referral Type:  Surgical    Referral Reason:  Specialty Services Required    Requested Specialty:  Vascular Surgery    Number of Visits Requested:  1   All questions were answered. The patient knows to call the clinic with any problems, questions or concerns. No barriers to learning was detected. I spent 30 minutes counseling the patient face to face. The total time spent in the appointment was 40 minutes and more than 50% was on counseling and review of test results     [facility], [place], MD 11/13/2014 3:27 PM

## 2014-11-13 NOTE — Assessment & Plan Note (Signed)
I am concerned about the most recent CT scan report which show evidence of leak around the aneurysm. I will refer him to vascular surgery for consultation for management of this.

## 2014-11-17 ENCOUNTER — Ambulatory Visit (INDEPENDENT_AMBULATORY_CARE_PROVIDER_SITE_OTHER): Payer: Medicare Other | Admitting: Internal Medicine

## 2014-11-17 ENCOUNTER — Encounter: Payer: Self-pay | Admitting: Internal Medicine

## 2014-11-17 VITALS — BP 152/62 | HR 60 | Ht 68.0 in | Wt 153.0 lb

## 2014-11-17 DIAGNOSIS — Z950 Presence of cardiac pacemaker: Secondary | ICD-10-CM | POA: Insufficient documentation

## 2014-11-17 DIAGNOSIS — I5022 Chronic systolic (congestive) heart failure: Secondary | ICD-10-CM

## 2014-11-17 DIAGNOSIS — I442 Atrioventricular block, complete: Secondary | ICD-10-CM

## 2014-11-17 DIAGNOSIS — R001 Bradycardia, unspecified: Secondary | ICD-10-CM | POA: Diagnosis not present

## 2014-11-17 LAB — CUP PACEART INCLINIC DEVICE CHECK
Battery Remaining Longevity: 120 mo
Battery Voltage: 3.01 V
Brady Statistic RA Percent Paced: 42 %
Brady Statistic RV Percent Paced: 99.93 %
Date Time Interrogation Session: 20160510130508
Lead Channel Pacing Threshold Amplitude: 1 V
Lead Channel Pacing Threshold Pulse Width: 0.4 ms
Lead Channel Pacing Threshold Pulse Width: 0.4 ms
Lead Channel Sensing Intrinsic Amplitude: 3.3 mV
Lead Channel Setting Pacing Amplitude: 1.25 V
Lead Channel Setting Pacing Amplitude: 1.625
Lead Channel Setting Pacing Pulse Width: 0.4 ms
MDC IDC MSMT LEADCHNL RA IMPEDANCE VALUE: 475 Ohm
MDC IDC MSMT LEADCHNL RA PACING THRESHOLD AMPLITUDE: 0.625 V
MDC IDC MSMT LEADCHNL RV IMPEDANCE VALUE: 400 Ohm
MDC IDC PG SERIAL: 3013631
MDC IDC SET LEADCHNL RV SENSING SENSITIVITY: 4 mV

## 2014-11-17 NOTE — Assessment & Plan Note (Signed)
His [employer] DDD PM is working normally. Will recheck in several months. 

## 2014-11-17 NOTE — Patient Instructions (Signed)
Medication Instructions:  Your physician recommends that you continue on your current medications as directed. Please refer to the Current Medication list given to you today.   Labwork: None ordered  Testing/Procedures: None ordered  Follow-Up: Your physician wants you to follow-up in: 12 months with Dr Taylor You will receive a reminder letter in the mail two months in advance. If you don't receive a letter, please call our office to schedule the follow-up appointment.  Remote monitoring is used to monitor your Pacemaker or ICD from home. This monitoring reduces the number of office visits required to check your device to one time per year. It allows us to keep an eye on the functioning of your device to ensure it is working properly. You are scheduled for a device check from home on 02/16/15. You may send your transmission at any time that day. If you have a wireless device, the transmission will be sent automatically. After your physician reviews your transmission, you will receive a postcard with your next transmission date.   Any Other Special Instructions Will Be Listed Below (If Applicable).   

## 2014-11-17 NOTE — Progress Notes (Signed)
HPI Anthony Hutchinson returns today for followup. He is a pleasant elderly man with complete heart block, s/p PPM insertion, chronic anemia on EPO, HTN, Myelodysplastic syndrome, and DM. He has very mild peripheral edema, which has been treated with diuretic therapy.  Allergies  Allergen Reactions  . Codeine Sulfate     Itching on fingers   . Penicillins     Itching on fingers     Current Outpatient Prescriptions  Medication Sig Dispense Refill  . alfuzosin (UROXATRAL) 10 MG 24 hr tablet Take 10 mg by mouth every evening.     . furosemide (LASIX) 20 MG tablet TAKE 1 TABLET DAILY (Patient taking differently: TAKE 1 TABLET BY MOUTH DAILY) 90 tablet 0  . levothyroxine (SYNTHROID, LEVOTHROID) 112 MCG tablet Take 112 mcg by mouth daily before breakfast.    . lisinopril (PRINIVIL,ZESTRIL) 10 MG tablet Take 10 mg by mouth every morning.    . Multiple Vitamin (MULTIVITAMIN WITH MINERALS) TABS Take 1 tablet by mouth every morning.    . TOPROL XL 25 MG 24 hr tablet Take 25 mg by mouth daily.     No current facility-administered medications for this visit.     Past Medical History  Diagnosis Date  . Hypertension   . LBBB (left bundle branch block)   . Diabetes mellitus without complication   . Cancer   . Hypothyroidism     nodule  . Cataracts, bilateral   . Anemia     iron deficiency anemia  . Inguinal hernia   . Peripheral vascular disease     Peripheral neuropathy  . Complete heart block   . MDS (myelodysplastic syndrome) 01/05/2014    ROS:   All systems reviewed and negative except as noted in the HPI.   Past Surgical History  Procedure Laterality Date  . Tonsillectomy    . Hiatal hernia repair    . Hernia repair  1979    bilateral inguinal hernia  . Thyroid lobectomy  6/85    right  . Acromionectomy  1998    rotator cuff repair  . Prostate surgery    . Abdominal aortic aneurysm repair    . Tonsillectomy    . Flexible sigmoidoscopy N/A 12/13/2012    Procedure:  FLEXIBLE SIGMOIDOSCOPY;  Surgeon: Beryle Beams, MD;  Location: [facility] ENDOSCOPY;  Service: Endoscopy;  Laterality: N/A;  . Hot hemostasis N/A 12/13/2012    Procedure: HOT HEMOSTASIS (ARGON PLASMA COAGULATION/BICAP);  Surgeon: Beryle Beams, MD;  Location: [facility];  Service: Endoscopy;  Laterality: N/A;  . Pacemaker insertion  11-03-13    STJ dual chamber pacemaker implanted by Dr Lovena Le for CHB  . Permanent pacemaker insertion N/A 11/03/2013    Procedure: PERMANENT PACEMAKER INSERTION;  Surgeon: Evans Lance, MD;  Location: [facility] CATH LAB;  Service: Cardiovascular;  Laterality: N/A;     Family History  Problem Relation Age of Onset  . Heart disease Mother   . Heart disease Father      History   Social History  . Marital Status: Married    Spouse Name: N/A  . Number of Children: N/A  . Years of Education: N/A   Occupational History  . Not on file.   Social History Main Topics  . Smoking status: Former Smoker    Types: Pipe    Quit date: 11/27/1963  . Smokeless tobacco: Never Used  . Alcohol Use: No  . Drug Use: No  . Sexual Activity: Not on file  Other Topics Concern  . Not on file   Social History Narrative     BP 152/62 mmHg  Pulse 60  Ht 5\' 8"  (1.727 m)  Wt 153 lb (69.4 kg)  BMI 23.27 kg/m2  Physical Exam:  Elderly appearing 81 yo man, NAD HEENT: Unremarkable Neck:  7 cm JVD, no thyromegally Back:  No CVA tenderness Lungs:  Clear with no wheezes, well healed PM incision. HEART:  Regular rate rhythm, no murmurs, no rubs, no clicks Abd:  soft, positive bowel sounds, no organomegally, no rebound, no guarding Ext:  2 plus pulses, no edema, no cyanosis, no clubbing Skin:  No rashes no nodules Neuro:  CN II through XII intact, motor grossly intact  DEVICE  Normal device function.  See PaceArt for details.   Assess/Plan:

## 2014-11-17 NOTE — Assessment & Plan Note (Signed)
His symptoms are class 2. He will continue his current meds.  

## 2014-11-19 ENCOUNTER — Other Ambulatory Visit (HOSPITAL_BASED_OUTPATIENT_CLINIC_OR_DEPARTMENT_OTHER): Payer: Medicare Other

## 2014-11-19 ENCOUNTER — Encounter: Payer: Self-pay | Admitting: Vascular Surgery

## 2014-11-19 ENCOUNTER — Ambulatory Visit (HOSPITAL_BASED_OUTPATIENT_CLINIC_OR_DEPARTMENT_OTHER): Payer: Medicare Other

## 2014-11-19 VITALS — BP 131/52 | HR 59 | Temp 98.3°F

## 2014-11-19 DIAGNOSIS — D469 Myelodysplastic syndrome, unspecified: Secondary | ICD-10-CM | POA: Diagnosis not present

## 2014-11-19 DIAGNOSIS — D631 Anemia in chronic kidney disease: Secondary | ICD-10-CM

## 2014-11-19 DIAGNOSIS — N183 Chronic kidney disease, stage 3 (moderate): Secondary | ICD-10-CM

## 2014-11-19 DIAGNOSIS — D630 Anemia in neoplastic disease: Secondary | ICD-10-CM

## 2014-11-19 LAB — CBC & DIFF AND RETIC
BASO%: 0.4 % (ref 0.0–2.0)
BASOS ABS: 0 10*3/uL (ref 0.0–0.1)
EOS ABS: 0.2 10*3/uL (ref 0.0–0.5)
EOS%: 2.2 % (ref 0.0–7.0)
HEMATOCRIT: 31 % — AB (ref 38.4–49.9)
HGB: 10.3 g/dL — ABNORMAL LOW (ref 13.0–17.1)
IMMATURE RETIC FRACT: 8.1 % (ref 3.00–10.60)
LYMPH#: 1.6 10*3/uL (ref 0.9–3.3)
LYMPH%: 22.9 % (ref 14.0–49.0)
MCH: 33.3 pg (ref 27.2–33.4)
MCHC: 33.2 g/dL (ref 32.0–36.0)
MCV: 100.3 fL — ABNORMAL HIGH (ref 79.3–98.0)
MONO#: 0.6 10*3/uL (ref 0.1–0.9)
MONO%: 8.1 % (ref 0.0–14.0)
NEUT#: 4.5 10*3/uL (ref 1.5–6.5)
NEUT%: 66.4 % (ref 39.0–75.0)
Platelets: 97 10*3/uL — ABNORMAL LOW (ref 140–400)
RBC: 3.09 10*6/uL — ABNORMAL LOW (ref 4.20–5.82)
RDW: 25.4 % — ABNORMAL HIGH (ref 11.0–14.6)
Retic %: 2.49 % — ABNORMAL HIGH (ref 0.80–1.80)
Retic Ct Abs: 76.94 10*3/uL (ref 34.80–93.90)
WBC: 6.8 10*3/uL (ref 4.0–10.3)
nRBC: 0 % (ref 0–0)

## 2014-11-19 LAB — IRON AND TIBC CHCC
%SAT: 43 % (ref 20–55)
Iron: 81 ug/dL (ref 42–163)
TIBC: 189 ug/dL — ABNORMAL LOW (ref 202–409)
UIBC: 108 ug/dL — ABNORMAL LOW (ref 117–376)

## 2014-11-19 LAB — VITAMIN B12: Vitamin B-12: 1032 pg/mL — ABNORMAL HIGH (ref 211–911)

## 2014-11-19 LAB — FERRITIN CHCC: Ferritin: 259 ng/ml (ref 22–316)

## 2014-11-19 MED ORDER — DARBEPOETIN ALFA 500 MCG/ML IJ SOSY
500.0000 ug | PREFILLED_SYRINGE | Freq: Once | INTRAMUSCULAR | Status: AC
  Administered 2014-11-19: 500 ug via SUBCUTANEOUS
  Filled 2014-11-19: qty 1

## 2014-11-20 ENCOUNTER — Ambulatory Visit (INDEPENDENT_AMBULATORY_CARE_PROVIDER_SITE_OTHER): Payer: Medicare Other | Admitting: Vascular Surgery

## 2014-11-20 ENCOUNTER — Encounter: Payer: Self-pay | Admitting: Vascular Surgery

## 2014-11-20 VITALS — BP 147/57 | HR 60 | Ht 68.0 in | Wt 156.6 lb

## 2014-11-20 DIAGNOSIS — I714 Abdominal aortic aneurysm, without rupture, unspecified: Secondary | ICD-10-CM | POA: Insufficient documentation

## 2014-11-20 NOTE — Progress Notes (Addendum)
Referred by: Merrilee Seashore, MD [address] [place], [place] [address]  Reason for referral: leaking AAA  History of Present Illness  The patient is a 81 y.o. (May 06, 1922) male who presents with chief complaint: "low blood."  Pt underwent an EVAR (Dr. Donnetta Hutching) in 04/23/00 with an original Aneurx graft.  Pt has been lost to follow up.  Pt developed myelodysplastic syndrome with splenic enlargement.  The patient has been scan previously for evaluation of intra-abdominal pathology including his splenic hypertrophy.  The patient had a Type 2 endoleak on those studies.  The patient denies any abdominal or back pain.  He denies any complication from that Aneurx repair.  Past Medical History  Diagnosis Date  . Hypertension   . LBBB (left bundle branch block)   . Diabetes mellitus without complication   . Cancer   . Hypothyroidism     nodule  . Cataracts, bilateral   . Anemia     iron deficiency anemia  . Inguinal hernia   . Peripheral vascular disease     Peripheral neuropathy  . Complete heart block   . MDS (myelodysplastic syndrome) 01/05/2014    Past Surgical History  Procedure Laterality Date  . Tonsillectomy    . Hiatal hernia repair    . Hernia repair  1979    bilateral inguinal hernia  . Thyroid lobectomy  6/85    right  . Acromionectomy  1998    rotator cuff repair  . Prostate surgery    . Abdominal aortic aneurysm repair    . Tonsillectomy    . Flexible sigmoidoscopy N/A 12/13/2012    Procedure: FLEXIBLE SIGMOIDOSCOPY;  Surgeon: Beryle Beams, MD;  Location: [facility] ENDOSCOPY;  Service: Endoscopy;  Laterality: N/A;  . Hot hemostasis N/A 12/13/2012    Procedure: HOT HEMOSTASIS (ARGON PLASMA COAGULATION/BICAP);  Surgeon: Beryle Beams, MD;  Location: [facility];  Service: Endoscopy;  Laterality: N/A;  . Pacemaker insertion  11-03-13    STJ dual chamber pacemaker implanted by Dr Lovena Le for CHB  . Permanent pacemaker insertion N/A 11/03/2013    Procedure:  PERMANENT PACEMAKER INSERTION;  Surgeon: Evans Lance, MD;  Location: [facility] CATH LAB;  Service: Cardiovascular;  Laterality: N/A;    History   Social History  . Marital Status: Married    Spouse Name: N/A  . Number of Children: N/A  . Years of Education: N/A   Occupational History  . Not on file.   Social History Main Topics  . Smoking status: Former Smoker    Types: Pipe    Quit date: 11/27/1963  . Smokeless tobacco: Never Used  . Alcohol Use: No  . Drug Use: No  . Sexual Activity: Not on file   Other Topics Concern  . Not on file   Social History Narrative    Family History  Problem Relation Age of Onset  . Heart disease Mother   . Heart disease Father   . Heart disease Son   . Hyperlipidemia Son     Current Outpatient Prescriptions on File Prior to Visit  Medication Sig Dispense Refill  . alfuzosin (UROXATRAL) 10 MG 24 hr tablet Take 10 mg by mouth every evening.     . furosemide (LASIX) 20 MG tablet TAKE 1 TABLET DAILY (Patient taking differently: TAKE 1 TABLET BY MOUTH DAILY) 90 tablet 0  . levothyroxine (SYNTHROID, LEVOTHROID) 112 MCG tablet Take 112 mcg by mouth daily before breakfast.    . lisinopril (PRINIVIL,ZESTRIL) 10 MG  tablet Take 10 mg by mouth every morning.    . Multiple Vitamin (MULTIVITAMIN WITH MINERALS) TABS Take 1 tablet by mouth every morning.    . TOPROL XL 25 MG 24 hr tablet Take 25 mg by mouth daily.     No current facility-administered medications on file prior to visit.    Allergies  Allergen Reactions  . Codeine Sulfate     Itching on fingers   . Penicillins     Itching on fingers    REVIEW OF SYSTEMS:  (Positives checked otherwise negative)  CARDIOVASCULAR:  []  chest pain, []  chest pressure, []  palpitations, []  shortness of breath when laying flat, []  shortness of breath with exertion,  [x]  pain in feet when walking, []  pain in feet when laying flat, []  history of blood clot in veins (DVT), []  history of phlebitis, []  swelling  in legs, []  varicose veins  PULMONARY:  []  productive cough, []  asthma, []  wheezing  NEUROLOGIC:  []  weakness in arms or legs, []  numbness in arms or legs, []  difficulty speaking or slurred speech, []  temporary loss of vision in one eye, []  dizziness  HEMATOLOGIC:  []  bleeding problems, []  problems with blood clotting too easily  MUSCULOSKEL:  []  joint pain, []  joint swelling  GASTROINTEST:  []  vomiting blood, []  blood in stool     GENITOURINARY:  []  burning with urination, []  blood in urine  PSYCHIATRIC:  []  history of major depression  INTEGUMENTARY:  []  rashes, []  ulcers  CONSTITUTIONAL:  []  fever, []  chills   Physical Examination  Filed Vitals:   11/20/14 1441  BP: 147/57  Pulse: 60  Height: 5\' 8"  (1.727 m)  Weight: 156 lb 9.6 oz (71.033 kg)  SpO2: 98%   Body mass index is 23.82 kg/(m^2).  General: A&O x 3, WD, elderly  Head: [place]/AT, Temporalis wasting   Ear/Nose/Throat: Hearing grossly intact, nares w/o erythema or drainage, oropharynx w/o Erythema/Exudate  Eyes: PERRLA, EOMI  Neck: Supple, no nuchal rigidity, no palpable LAD, R supraclavicular incision  Pulmonary: Sym exp, good air movt, CTAB, no rales, rhonchi, & wheezing  Cardiac: RRR, Nl S1, S2, no Murmurs, rubs or gallops  Vascular: Vessel Right Left  Radial Palpable Palpable  Brachial Palpable Palpable  Carotid Palpable, without bruit Palpable, without bruit  Aorta Not palpable N/A  Femoral Palpable Palpable  Popliteal Not palpable Not palpable  PT Palpable Palpable  DP Faintly Palpable Palpable   Gastrointestinal: soft, NTND, -G/R, - HSM, - masses, - CVAT B  Musculoskeletal: M/S 5/5 throughout , Extremities without ischemic changes   Neurologic: CN 2-12 intact , Pain and light touch intact in extremities , Motor exam as listed above  Psychiatric: Judgment intact, Mood & affect appropriate for pt's clinical situation  Dermatologic: See M/S exam for extremity exam, no rashes otherwise noted,  multiple patches of echymosis on extremities  Lymph : No Cervical, Axillary, or Inguinal lymphadenopathy   CT abd/pelvis (10/26/14)  Aneurysm sac has increased from 6.7 cm to 7.2 cm in greatest diameter.   There is a persistent type 2 endoleak which is less prominent than on the prior study.  Developing pulmonary fibrosis at the lung bases.  Medical Decision Making  The patient is a 81 y.o. male who presents with: enlarging AAA s/p EVAR with Aneurx, chronic kidney disease stage III, myelodysplastic syndrome   Original Aneurx endograft are well known for migration and Type 3 endoleaks.  This patient has had well documentation of a Type II endoleak since 2002, so  this "leak" is nothing new.  In this elderly patient with multiple active co-morbidities, he is not a candidate for open conversion  If he wants to consider possible endovascular salvage, I would start with an aortogram to try identify any Type Ia, Ib, or Type III endoleaks.  I would separate any intervention from the diagnostic studies to try to prevent renal failure from CIN.  I discussed with the patient the nature of angiographic procedures, especially the limited patencies of any endovascular intervention.    The patient is aware of that the risks of an angiographic procedure include but are not limited to: bleeding, infection, access site complications, renal failure, embolization, rupture of vessel, dissection, possible need for emergent surgical intervention, possible need for surgical procedures to treat the patient's pathology, anaphylactic reaction to contrast, and stroke and death.    We discussed also possible expectant management.   The patient is going to decide what he want to do.  Pt expressed his thanks again to Dr. Donnetta Hutching for his prior aneurysm repair.  Thank you for allowing [place] to participate in this patient's care.  Adele Barthel, MD [facility] Office: [phone] Pager:  [phone]  11/20/2014, 3:38 PM

## 2014-11-23 LAB — ERYTHROPOIETIN: ERYTHROPOIETIN: 53 m[IU]/mL — AB (ref 2.6–18.5)

## 2014-11-26 ENCOUNTER — Other Ambulatory Visit: Payer: Medicare Other

## 2014-11-26 ENCOUNTER — Ambulatory Visit: Payer: Medicare Other

## 2014-12-03 ENCOUNTER — Other Ambulatory Visit (HOSPITAL_BASED_OUTPATIENT_CLINIC_OR_DEPARTMENT_OTHER): Payer: Medicare Other

## 2014-12-03 ENCOUNTER — Ambulatory Visit (HOSPITAL_BASED_OUTPATIENT_CLINIC_OR_DEPARTMENT_OTHER): Payer: Medicare Other

## 2014-12-03 VITALS — BP 119/51 | HR 60 | Temp 98.1°F

## 2014-12-03 DIAGNOSIS — N183 Chronic kidney disease, stage 3 (moderate): Secondary | ICD-10-CM

## 2014-12-03 DIAGNOSIS — D630 Anemia in neoplastic disease: Secondary | ICD-10-CM

## 2014-12-03 DIAGNOSIS — D631 Anemia in chronic kidney disease: Secondary | ICD-10-CM

## 2014-12-03 DIAGNOSIS — D469 Myelodysplastic syndrome, unspecified: Secondary | ICD-10-CM

## 2014-12-03 LAB — CBC & DIFF AND RETIC
BASO%: 0.5 % (ref 0.0–2.0)
BASOS ABS: 0 10*3/uL (ref 0.0–0.1)
EOS%: 2.2 % (ref 0.0–7.0)
Eosinophils Absolute: 0.1 10*3/uL (ref 0.0–0.5)
HCT: 31.2 % — ABNORMAL LOW (ref 38.4–49.9)
HGB: 10.4 g/dL — ABNORMAL LOW (ref 13.0–17.1)
IMMATURE RETIC FRACT: 10 % (ref 3.00–10.60)
LYMPH#: 1.3 10*3/uL (ref 0.9–3.3)
LYMPH%: 23.8 % (ref 14.0–49.0)
MCH: 33.5 pg — ABNORMAL HIGH (ref 27.2–33.4)
MCHC: 33.3 g/dL (ref 32.0–36.0)
MCV: 100.6 fL — ABNORMAL HIGH (ref 79.3–98.0)
MONO#: 0.5 10*3/uL (ref 0.1–0.9)
MONO%: 9 % (ref 0.0–14.0)
NEUT#: 3.5 10*3/uL (ref 1.5–6.5)
NEUT%: 64.5 % (ref 39.0–75.0)
Platelets: 63 10*3/uL — ABNORMAL LOW (ref 140–400)
RBC: 3.1 10*6/uL — ABNORMAL LOW (ref 4.20–5.82)
RDW: 24.3 % — ABNORMAL HIGH (ref 11.0–14.6)
Retic %: 2.47 % — ABNORMAL HIGH (ref 0.80–1.80)
Retic Ct Abs: 76.57 10*3/uL (ref 34.80–93.90)
WBC: 5.5 10*3/uL (ref 4.0–10.3)
nRBC: 0 % (ref 0–0)

## 2014-12-03 LAB — TECHNOLOGIST REVIEW

## 2014-12-03 MED ORDER — DARBEPOETIN ALFA 500 MCG/ML IJ SOSY
500.0000 ug | PREFILLED_SYRINGE | Freq: Once | INTRAMUSCULAR | Status: AC
  Administered 2014-12-03: 500 ug via SUBCUTANEOUS
  Filled 2014-12-03: qty 1

## 2014-12-16 ENCOUNTER — Ambulatory Visit (INDEPENDENT_AMBULATORY_CARE_PROVIDER_SITE_OTHER): Payer: Medicare Other | Admitting: Podiatry

## 2014-12-16 ENCOUNTER — Encounter: Payer: Self-pay | Admitting: Podiatry

## 2014-12-16 VITALS — BP 154/69 | HR 62 | Temp 96.5°F | Resp 12

## 2014-12-16 DIAGNOSIS — B351 Tinea unguium: Secondary | ICD-10-CM

## 2014-12-16 DIAGNOSIS — G629 Polyneuropathy, unspecified: Secondary | ICD-10-CM

## 2014-12-16 NOTE — Progress Notes (Signed)
   Subjective:    Patient ID: Anthony Hutchinson, male    DOB: February 20, 1922, 81 y.o.   MRN: [id number]  HPI N- not much feeling,sometimes toes have  pains shooting thru them L- B/L feet D- 2 wks ago O-gradual C- sharpe A-socks T-wife use to trim them but she died in Nov 20, 2022  Patient presents here today for B/l toenail trim, B/L shooting toe pain"    Review of Systems  HENT: Positive for hearing loss.   Neurological: Positive for numbness.       Objective:   Physical Exam  Hard of hearing orientated 3 patient presents with his daughter in the treatment room  Vascular: DP pulses 2/4 bilaterally PT pulses 1/4 bilaterally  Neurological: Sensation to 10 g monofilament wire intact 2/5 right and 0/5 left Vibratory sensation nonreactive bilaterally Ankle reflex equal and reactive bilaterally  Dermatological: Atrophic skin without any hair growth noted bilaterally No open skin lesions bilaterally The toenails are brittle elongated, discolored 6-10  Musculoskeletal: Dorsi flexion toes 2-4 left at the MPJ joint Hallux malleus right       Assessment & Plan:   Assessment: Peripheral neuropathy Mycotic toenails  Plan: Debrided toenails 10 without a bleeding  Reappoint when necessary or at three-month intervals

## 2014-12-17 ENCOUNTER — Ambulatory Visit (HOSPITAL_BASED_OUTPATIENT_CLINIC_OR_DEPARTMENT_OTHER): Payer: Medicare Other

## 2014-12-17 ENCOUNTER — Other Ambulatory Visit (HOSPITAL_BASED_OUTPATIENT_CLINIC_OR_DEPARTMENT_OTHER): Payer: Medicare Other

## 2014-12-17 ENCOUNTER — Ambulatory Visit: Payer: Medicare Other

## 2014-12-17 ENCOUNTER — Other Ambulatory Visit: Payer: Medicare Other

## 2014-12-17 VITALS — BP 132/67 | HR 59 | Temp 97.6°F

## 2014-12-17 DIAGNOSIS — D630 Anemia in neoplastic disease: Secondary | ICD-10-CM

## 2014-12-17 DIAGNOSIS — D469 Myelodysplastic syndrome, unspecified: Secondary | ICD-10-CM

## 2014-12-17 DIAGNOSIS — N183 Chronic kidney disease, stage 3 (moderate): Secondary | ICD-10-CM

## 2014-12-17 DIAGNOSIS — D631 Anemia in chronic kidney disease: Secondary | ICD-10-CM

## 2014-12-17 LAB — CBC & DIFF AND RETIC
BASO%: 0.3 % (ref 0.0–2.0)
BASOS ABS: 0 10*3/uL (ref 0.0–0.1)
EOS%: 2.7 % (ref 0.0–7.0)
Eosinophils Absolute: 0.2 10*3/uL (ref 0.0–0.5)
HEMATOCRIT: 31.3 % — AB (ref 38.4–49.9)
HGB: 10.3 g/dL — ABNORMAL LOW (ref 13.0–17.1)
Immature Retic Fract: 3.4 % (ref 3.00–10.60)
LYMPH%: 19.3 % (ref 14.0–49.0)
MCH: 32.9 pg (ref 27.2–33.4)
MCHC: 32.9 g/dL (ref 32.0–36.0)
MCV: 100 fL — ABNORMAL HIGH (ref 79.3–98.0)
MONO#: 0.5 10*3/uL (ref 0.1–0.9)
MONO%: 8.1 % (ref 0.0–14.0)
NEUT#: 4.1 10*3/uL (ref 1.5–6.5)
NEUT%: 69.6 % (ref 39.0–75.0)
Platelets: 66 10*3/uL — ABNORMAL LOW (ref 140–400)
RBC: 3.13 10*6/uL — AB (ref 4.20–5.82)
RDW: 23.4 % — ABNORMAL HIGH (ref 11.0–14.6)
RETIC %: 1.67 % (ref 0.80–1.80)
Retic Ct Abs: 52.27 10*3/uL (ref 34.80–93.90)
WBC: 5.9 10*3/uL (ref 4.0–10.3)
lymph#: 1.1 10*3/uL (ref 0.9–3.3)

## 2014-12-17 MED ORDER — DARBEPOETIN ALFA 500 MCG/ML IJ SOSY
500.0000 ug | PREFILLED_SYRINGE | Freq: Once | INTRAMUSCULAR | Status: AC
  Administered 2014-12-17: 500 ug via SUBCUTANEOUS
  Filled 2014-12-17: qty 1

## 2014-12-18 ENCOUNTER — Emergency Department (HOSPITAL_COMMUNITY)
Admission: EM | Admit: 2014-12-18 | Discharge: 2014-12-19 | Disposition: A | Discharge status: Home / Self Care | Payer: Medicare Other | Attending: Emergency Medicine | Admitting: Emergency Medicine

## 2014-12-18 ENCOUNTER — Emergency Department (HOSPITAL_COMMUNITY): Payer: Medicare Other

## 2014-12-18 ENCOUNTER — Encounter (HOSPITAL_COMMUNITY): Payer: Self-pay | Admitting: Emergency Medicine

## 2014-12-18 DIAGNOSIS — Z8669 Personal history of other diseases of the nervous system and sense organs: Secondary | ICD-10-CM | POA: Diagnosis not present

## 2014-12-18 DIAGNOSIS — M79675 Pain in left toe(s): Secondary | ICD-10-CM | POA: Diagnosis present

## 2014-12-18 DIAGNOSIS — Z87891 Personal history of nicotine dependence: Secondary | ICD-10-CM | POA: Diagnosis not present

## 2014-12-18 DIAGNOSIS — E119 Type 2 diabetes mellitus without complications: Secondary | ICD-10-CM | POA: Diagnosis not present

## 2014-12-18 DIAGNOSIS — Z862 Personal history of diseases of the blood and blood-forming organs and certain disorders involving the immune mechanism: Secondary | ICD-10-CM | POA: Insufficient documentation

## 2014-12-18 DIAGNOSIS — Z880 Allergy status to penicillin: Secondary | ICD-10-CM | POA: Insufficient documentation

## 2014-12-18 DIAGNOSIS — I10 Essential (primary) hypertension: Secondary | ICD-10-CM | POA: Diagnosis not present

## 2014-12-18 DIAGNOSIS — E039 Hypothyroidism, unspecified: Secondary | ICD-10-CM | POA: Diagnosis not present

## 2014-12-18 DIAGNOSIS — Z79899 Other long term (current) drug therapy: Secondary | ICD-10-CM | POA: Diagnosis not present

## 2014-12-18 DIAGNOSIS — Z8719 Personal history of other diseases of the digestive system: Secondary | ICD-10-CM | POA: Insufficient documentation

## 2014-12-18 DIAGNOSIS — Z859 Personal history of malignant neoplasm, unspecified: Secondary | ICD-10-CM | POA: Insufficient documentation

## 2014-12-18 NOTE — ED Provider Notes (Signed)
CSN: [id number]     Arrival date & time 12/18/14  2019 History   First MD Initiated Contact with Patient 12/18/14 2301     Chief Complaint  Patient presents with  . Toe Pain     (Consider location/radiation/quality/duration/timing/severity/associated sxs/prior Treatment) HPI  This is a 81 year old male with a history of hypertension, diabetes, peripheral vascular disease, and neuropathy who presents with left second toe pain. Patient recently lost his wife to a stroke and states that he is concerned that his toe pain is a warning sign. He states that the pain comes and goes. It is not worse with anything. He took Tylenol today with relief of his pain. Current pain is 0 out of 10.  Reports possible remote injury but no new injury. Patient also reports bilateral hand cramping. This is not new for him. It also comes and goes.  Denies other symptoms including fever, chest pain, shortness of breath, abdominal pain.  Past Medical History  Diagnosis Date  . Hypertension   . LBBB (left bundle branch block)   . Diabetes mellitus without complication   . Cancer   . Hypothyroidism     nodule  . Cataracts, bilateral   . Anemia     iron deficiency anemia  . Inguinal hernia   . Peripheral vascular disease     Peripheral neuropathy  . Complete heart block   . MDS (myelodysplastic syndrome) 01/05/2014   Past Surgical History  Procedure Laterality Date  . Tonsillectomy    . Hiatal hernia repair    . Hernia repair  1979    bilateral inguinal hernia  . Thyroid lobectomy  6/85    right  . Acromionectomy  1998    rotator cuff repair  . Prostate surgery    . Abdominal aortic aneurysm repair    . Tonsillectomy    . Flexible sigmoidoscopy N/A 12/13/2012    Procedure: FLEXIBLE SIGMOIDOSCOPY;  Surgeon: Beryle Beams, MD;  Location: [facility]OS[facility];  Service: Endoscopy;  Laterality: N/A;  . Hot hemostasis N/A 12/13/2012    Procedure: HOT HEMOSTASIS (ARGON PLASMA COAGULATION/BICAP);  Surgeon: Beryle Beams, MD;  Location: [facility];  Service: Endoscopy;  Laterality: N/A;  . Pacemaker insertion  11-03-13    STJ dual chamber pacemaker implanted by Dr Lovena Le for CHB  . Permanent pacemaker insertion N/A 11/03/2013    Procedure: PERMANENT PACEMAKER INSERTION;  Surgeon: Evans Lance, MD;  Location: [facility] CATH LAB;  Service: Cardiovascular;  Laterality: N/A;   Family History  Problem Relation Age of Onset  . Heart disease Mother   . Heart disease Father   . Heart disease Son   . Hyperlipidemia Son    History  Substance Use Topics  . Smoking status: Former Smoker    Types: Pipe    Quit date: 11/27/1963  . Smokeless tobacco: Never Used  . Alcohol Use: No    Review of Systems  Constitutional: Negative.  Negative for fever.  Respiratory: Negative.  Negative for chest tightness and shortness of breath.   Cardiovascular: Negative.  Negative for chest pain.  Gastrointestinal: Negative.  Negative for abdominal pain.  Genitourinary: Negative.  Negative for dysuria.  Musculoskeletal: Negative for back pain.       Left second toe pain  All other systems reviewed and are negative.     Allergies  Codeine sulfate and Penicillins  Home Medications   Prior to Admission medications   Medication Sig Start Date End Date Taking? Authorizing Provider  alfuzosin Dalbert Garnet)  10 MG 24 hr tablet Take 10 mg by mouth every evening.    Yes Historical Provider, MD  ketoconazole (NIZORAL) 2 % cream Apply 1 application topically daily as needed. For jock itch 09/07/14  Yes Historical Provider, MD  levothyroxine (SYNTHROID, LEVOTHROID) 112 MCG tablet Take 112 mcg by mouth daily before breakfast.   Yes Historical Provider, MD  lisinopril (PRINIVIL,ZESTRIL) 10 MG tablet Take 10 mg by mouth every morning.   Yes Historical Provider, MD  Multiple Vitamin (MULTIVITAMIN WITH MINERALS) TABS Take 1 tablet by mouth every morning.   Yes Historical Provider, MD  SYNTHROID 125 MCG tablet  09/15/14  Yes Historical  Provider, MD  TOPROL XL 25 MG 24 hr tablet Take 25 mg by mouth daily. 12/24/13  Yes Historical Provider, MD  acetaminophen (TYLENOL) 500 MG tablet Take 1 tablet (500 mg total) by mouth every 6 (six) hours as needed. 12/19/14   Merryl Hacker, MD  furosemide (LASIX) 20 MG tablet TAKE 1 TABLET DAILY Patient taking differently: TAKE 1 TABLET BY MOUTH DAILY 11/12/14   Evans Lance, MD   BP 128/55 mmHg  Pulse 65  Temp(Src) 97.8 F (36.6 C) (Oral)  Resp 18  SpO2 95% Physical Exam  Constitutional: He is oriented to person, place, and time. No distress.  Elderly  HENT:  Head: Normocephalic and atraumatic.  Cardiovascular: Normal rate, regular rhythm and normal heart sounds.   No murmur heard. Pulmonary/Chest: Effort normal and breath sounds normal. No respiratory distress. He has no wheezes.  Musculoskeletal: He exhibits edema.  Trace symmetric bilateral lower extremity edema, no obvious deformity to the left foot or toes, mild discoloration over the toes, normal range of motion, no tenderness upon palpation, decreased sensation over the plantar aspect of the foot bilaterally  Neurological: He is alert and oriented to person, place, and time.  5 out of 5 strength bilateral lower extremities  Skin: Skin is warm and dry.  Psychiatric: He has a normal mood and affect.  Nursing note and vitals reviewed.   ED Course  Procedures (including critical care time) Labs Review Labs Reviewed - No data to display  Imaging Review Dg Foot Complete Left  12/18/2014   CLINICAL DATA:  Nontraumatic foot pain for several weeks, much worse tonight.  EXAM: LEFT FOOT - COMPLETE 3+ VIEW  COMPARISON:  None.  FINDINGS: There is no evidence of fracture or dislocation. There is no evidence of arthropathy or other focal bone abnormality. Soft tissues are unremarkable.  IMPRESSION: Negative.   Electronically Signed   By: Andreas Newport M.D.   On: 12/18/2014 23:42     EKG Interpretation None      MDM    Final diagnoses:  Pain of toe of left foot    Patient presents with left hip pain. No acute changes. No deformities. Gait toe appears well perfused. Patient does have decreased sensation but that is over the bilateral feet. History of neuropathy. X-ray is negative. Currently pain-free. Discussed with patient follow-up with primary physician. Patient stated understanding.  After history, exam, and medical workup I feel the patient has been appropriately medically screened and is safe for discharge home. Pertinent diagnoses were discussed with the patient. Patient was given return precautions.     Merryl Hacker, MD 12/19/14 [phone]

## 2014-12-18 NOTE — ED Notes (Signed)
Pt presents with L middle toe pain onset today. Pt states he does have random shooting pain in toes at times but this pain is more severe. Pt states he recently lost wife to CVA and is concerned this may be warning sign. Per family pt was c/o L hand cramping earlier today and has been doing yard work recently. Legs noted to have edema, pt states this is normal for him

## 2014-12-19 MED ORDER — ACETAMINOPHEN 500 MG PO TABS: 500.0000 mg | ORAL_TABLET | Freq: Four times a day (QID) | ORAL | Status: DC | PRN

## 2014-12-19 NOTE — ED Notes (Signed)
Pt denies pain at this time.  No acute distress noted, no additional needs expressed. Family at the bedside.

## 2014-12-19 NOTE — Discharge Instructions (Signed)
You were seen today for pain in your toe. There is no obvious injury. You have a history of neuropathy and this may be related. He also had a history of vascular disease. He noted increasing discoloration to the toe, redness, warmth, or any new or worsening symptoms, he should be reevaluated.

## 2014-12-31 ENCOUNTER — Ambulatory Visit (HOSPITAL_BASED_OUTPATIENT_CLINIC_OR_DEPARTMENT_OTHER): Payer: Medicare Other

## 2014-12-31 ENCOUNTER — Other Ambulatory Visit (HOSPITAL_BASED_OUTPATIENT_CLINIC_OR_DEPARTMENT_OTHER): Payer: Medicare Other

## 2014-12-31 VITALS — BP 139/52 | HR 60 | Temp 98.3°F

## 2014-12-31 DIAGNOSIS — D469 Myelodysplastic syndrome, unspecified: Secondary | ICD-10-CM | POA: Diagnosis not present

## 2014-12-31 DIAGNOSIS — N183 Chronic kidney disease, stage 3 (moderate): Secondary | ICD-10-CM

## 2014-12-31 DIAGNOSIS — D631 Anemia in chronic kidney disease: Secondary | ICD-10-CM

## 2014-12-31 DIAGNOSIS — D630 Anemia in neoplastic disease: Secondary | ICD-10-CM

## 2014-12-31 LAB — CBC & DIFF AND RETIC
BASO%: 0.4 % (ref 0.0–2.0)
BASOS ABS: 0 10*3/uL (ref 0.0–0.1)
EOS%: 2.8 % (ref 0.0–7.0)
Eosinophils Absolute: 0.2 10*3/uL (ref 0.0–0.5)
HEMATOCRIT: 30 % — AB (ref 38.4–49.9)
HGB: 9.9 g/dL — ABNORMAL LOW (ref 13.0–17.1)
Immature Retic Fract: 10.7 % — ABNORMAL HIGH (ref 3.00–10.60)
LYMPH%: 21.7 % (ref 14.0–49.0)
MCH: 32.4 pg (ref 27.2–33.4)
MCHC: 33 g/dL (ref 32.0–36.0)
MCV: 98 fL (ref 79.3–98.0)
MONO#: 0.6 10*3/uL (ref 0.1–0.9)
MONO%: 11.5 % (ref 0.0–14.0)
NEUT#: 3.4 10*3/uL (ref 1.5–6.5)
NEUT%: 63.6 % (ref 39.0–75.0)
Platelets: 63 10*3/uL — ABNORMAL LOW (ref 140–400)
RBC: 3.06 10*6/uL — ABNORMAL LOW (ref 4.20–5.82)
RDW: 23.9 % — ABNORMAL HIGH (ref 11.0–14.6)
Retic %: 1.91 % — ABNORMAL HIGH (ref 0.80–1.80)
Retic Ct Abs: 58.45 10*3/uL (ref 34.80–93.90)
WBC: 5.3 10*3/uL (ref 4.0–10.3)
lymph#: 1.2 10*3/uL (ref 0.9–3.3)

## 2014-12-31 MED ORDER — DARBEPOETIN ALFA 500 MCG/ML IJ SOSY
500.0000 ug | PREFILLED_SYRINGE | Freq: Once | INTRAMUSCULAR | Status: AC
  Administered 2014-12-31: 500 ug via SUBCUTANEOUS
  Filled 2014-12-31: qty 1

## 2015-01-07 ENCOUNTER — Other Ambulatory Visit: Payer: Medicare Other

## 2015-01-07 ENCOUNTER — Ambulatory Visit: Payer: Medicare Other | Admitting: Hematology and Oncology

## 2015-01-07 ENCOUNTER — Ambulatory Visit: Payer: Medicare Other

## 2015-01-14 ENCOUNTER — Ambulatory Visit (HOSPITAL_BASED_OUTPATIENT_CLINIC_OR_DEPARTMENT_OTHER): Payer: Medicare Other

## 2015-01-14 ENCOUNTER — Other Ambulatory Visit (HOSPITAL_BASED_OUTPATIENT_CLINIC_OR_DEPARTMENT_OTHER): Payer: Medicare Other

## 2015-01-14 VITALS — BP 118/51 | HR 61 | Temp 98.1°F

## 2015-01-14 DIAGNOSIS — D469 Myelodysplastic syndrome, unspecified: Secondary | ICD-10-CM

## 2015-01-14 DIAGNOSIS — D630 Anemia in neoplastic disease: Secondary | ICD-10-CM

## 2015-01-14 DIAGNOSIS — D631 Anemia in chronic kidney disease: Secondary | ICD-10-CM

## 2015-01-14 DIAGNOSIS — N183 Chronic kidney disease, stage 3 (moderate): Secondary | ICD-10-CM

## 2015-01-14 LAB — CBC & DIFF AND RETIC
BASO%: 0.4 % (ref 0.0–2.0)
BASOS ABS: 0 10*3/uL (ref 0.0–0.1)
EOS%: 3.2 % (ref 0.0–7.0)
Eosinophils Absolute: 0.2 10*3/uL (ref 0.0–0.5)
HCT: 30.3 % — ABNORMAL LOW (ref 38.4–49.9)
HEMOGLOBIN: 9.9 g/dL — AB (ref 13.0–17.1)
IMMATURE RETIC FRACT: 9 % (ref 3.00–10.60)
LYMPH%: 22.3 % (ref 14.0–49.0)
MCH: 31.7 pg (ref 27.2–33.4)
MCHC: 32.7 g/dL (ref 32.0–36.0)
MCV: 97.1 fL (ref 79.3–98.0)
MONO#: 0.4 10*3/uL (ref 0.1–0.9)
MONO%: 7.4 % (ref 0.0–14.0)
NEUT#: 3.3 10*3/uL (ref 1.5–6.5)
NEUT%: 66.7 % (ref 39.0–75.0)
PLATELETS: 73 10*3/uL — AB (ref 140–400)
RBC: 3.12 10*6/uL — ABNORMAL LOW (ref 4.20–5.82)
RDW: 23.7 % — ABNORMAL HIGH (ref 11.0–14.6)
Retic %: 1.9 % — ABNORMAL HIGH (ref 0.80–1.80)
Retic Ct Abs: 59.28 10*3/uL (ref 34.80–93.90)
WBC: 5 10*3/uL (ref 4.0–10.3)
lymph#: 1.1 10*3/uL (ref 0.9–3.3)

## 2015-01-14 MED ORDER — DARBEPOETIN ALFA 500 MCG/ML IJ SOSY
500.0000 ug | PREFILLED_SYRINGE | Freq: Once | INTRAMUSCULAR | Status: AC
  Administered 2015-01-14: 500 ug via SUBCUTANEOUS
  Filled 2015-01-14: qty 1

## 2015-01-28 ENCOUNTER — Ambulatory Visit (HOSPITAL_BASED_OUTPATIENT_CLINIC_OR_DEPARTMENT_OTHER): Payer: Medicare Other

## 2015-01-28 ENCOUNTER — Other Ambulatory Visit (HOSPITAL_BASED_OUTPATIENT_CLINIC_OR_DEPARTMENT_OTHER): Payer: Medicare Other

## 2015-01-28 VITALS — BP 120/49 | HR 60 | Temp 97.3°F

## 2015-01-28 DIAGNOSIS — N183 Chronic kidney disease, stage 3 (moderate): Secondary | ICD-10-CM

## 2015-01-28 DIAGNOSIS — D630 Anemia in neoplastic disease: Secondary | ICD-10-CM

## 2015-01-28 DIAGNOSIS — D631 Anemia in chronic kidney disease: Secondary | ICD-10-CM

## 2015-01-28 DIAGNOSIS — D469 Myelodysplastic syndrome, unspecified: Secondary | ICD-10-CM | POA: Diagnosis not present

## 2015-01-28 LAB — CBC WITH DIFFERENTIAL/PLATELET
BASO%: 0.7 % (ref 0.0–2.0)
Basophils Absolute: 0 10*3/uL (ref 0.0–0.1)
EOS%: 2.9 % (ref 0.0–7.0)
Eosinophils Absolute: 0.2 10*3/uL (ref 0.0–0.5)
HEMATOCRIT: 28.9 % — AB (ref 38.4–49.9)
HGB: 9.5 g/dL — ABNORMAL LOW (ref 13.0–17.1)
LYMPH#: 1.1 10*3/uL (ref 0.9–3.3)
LYMPH%: 21 % (ref 14.0–49.0)
MCH: 31.6 pg (ref 27.2–33.4)
MCHC: 32.9 g/dL (ref 32.0–36.0)
MCV: 96 fL (ref 79.3–98.0)
MONO#: 0.5 10*3/uL (ref 0.1–0.9)
MONO%: 9 % (ref 0.0–14.0)
NEUT#: 3.6 10*3/uL (ref 1.5–6.5)
NEUT%: 66.4 % (ref 39.0–75.0)
Platelets: 68 10*3/uL — ABNORMAL LOW (ref 140–400)
RBC: 3.01 10*6/uL — ABNORMAL LOW (ref 4.20–5.82)
RDW: 23.9 % — ABNORMAL HIGH (ref 11.0–14.6)
WBC: 5.4 10*3/uL (ref 4.0–10.3)
nRBC: 0 % (ref 0–0)

## 2015-01-28 LAB — TECHNOLOGIST REVIEW

## 2015-01-28 MED ORDER — DARBEPOETIN ALFA 500 MCG/ML IJ SOSY
500.0000 ug | PREFILLED_SYRINGE | Freq: Once | INTRAMUSCULAR | Status: AC
  Administered 2015-01-28: 500 ug via SUBCUTANEOUS
  Filled 2015-01-28: qty 1

## 2015-02-08 ENCOUNTER — Other Ambulatory Visit: Payer: Self-pay | Admitting: Internal Medicine

## 2015-02-11 ENCOUNTER — Other Ambulatory Visit (HOSPITAL_BASED_OUTPATIENT_CLINIC_OR_DEPARTMENT_OTHER): Payer: Medicare Other

## 2015-02-11 ENCOUNTER — Ambulatory Visit (HOSPITAL_BASED_OUTPATIENT_CLINIC_OR_DEPARTMENT_OTHER): Payer: Medicare Other

## 2015-02-11 VITALS — BP 123/61 | HR 60 | Temp 98.2°F

## 2015-02-11 DIAGNOSIS — D469 Myelodysplastic syndrome, unspecified: Secondary | ICD-10-CM | POA: Diagnosis not present

## 2015-02-11 DIAGNOSIS — D630 Anemia in neoplastic disease: Secondary | ICD-10-CM

## 2015-02-11 DIAGNOSIS — D631 Anemia in chronic kidney disease: Secondary | ICD-10-CM

## 2015-02-11 DIAGNOSIS — N183 Chronic kidney disease, stage 3 (moderate): Secondary | ICD-10-CM

## 2015-02-11 LAB — CBC WITH DIFFERENTIAL/PLATELET
BASO%: 1.1 % (ref 0.0–2.0)
Basophils Absolute: 0.1 10*3/uL (ref 0.0–0.1)
EOS%: 4.2 % (ref 0.0–7.0)
Eosinophils Absolute: 0.3 10*3/uL (ref 0.0–0.5)
HCT: 31.9 % — ABNORMAL LOW (ref 38.4–49.9)
HGB: 10.2 g/dL — ABNORMAL LOW (ref 13.0–17.1)
LYMPH#: 1 10*3/uL (ref 0.9–3.3)
LYMPH%: 16.4 % (ref 14.0–49.0)
MCH: 31.4 pg (ref 27.2–33.4)
MCHC: 31.8 g/dL — ABNORMAL LOW (ref 32.0–36.0)
MCV: 98.6 fL — ABNORMAL HIGH (ref 79.3–98.0)
MONO#: 0.6 10*3/uL (ref 0.1–0.9)
MONO%: 10.3 % (ref 0.0–14.0)
NEUT#: 4.2 10*3/uL (ref 1.5–6.5)
NEUT%: 68 % (ref 39.0–75.0)
PLATELETS: 83 10*3/uL — AB (ref 140–400)
RBC: 3.24 10*6/uL — ABNORMAL LOW (ref 4.20–5.82)
RDW: 25.5 % — ABNORMAL HIGH (ref 11.0–14.6)
WBC: 6.2 10*3/uL (ref 4.0–10.3)

## 2015-02-11 MED ORDER — DARBEPOETIN ALFA 500 MCG/ML IJ SOSY
500.0000 ug | PREFILLED_SYRINGE | Freq: Once | INTRAMUSCULAR | Status: AC
  Administered 2015-02-11: 500 ug via SUBCUTANEOUS
  Filled 2015-02-11: qty 1

## 2015-02-16 ENCOUNTER — Ambulatory Visit (INDEPENDENT_AMBULATORY_CARE_PROVIDER_SITE_OTHER): Payer: Medicare Other | Admitting: *Deleted

## 2015-02-16 ENCOUNTER — Encounter: Payer: Self-pay | Admitting: Internal Medicine

## 2015-02-16 ENCOUNTER — Telehealth: Payer: Self-pay | Admitting: Internal Medicine

## 2015-02-16 DIAGNOSIS — I442 Atrioventricular block, complete: Secondary | ICD-10-CM | POA: Diagnosis not present

## 2015-02-16 NOTE — Progress Notes (Signed)
Remote pacemaker transmission.   

## 2015-02-16 NOTE — Telephone Encounter (Signed)
Informed pt that his transmission received.  

## 2015-02-16 NOTE — Telephone Encounter (Signed)
New Message  Pt request a call back to determine if the signal was received

## 2015-02-22 LAB — CUP PACEART REMOTE DEVICE CHECK
Battery Remaining Longevity: 122 mo
Battery Remaining Percentage: 95.5 %
Battery Voltage: 3.01 V
Brady Statistic AP VP Percent: 55 %
Brady Statistic AP VS Percent: 1 %
Brady Statistic RA Percent Paced: 52 %
Brady Statistic RV Percent Paced: 99 %
Lead Channel Impedance Value: 430 Ohm
Lead Channel Impedance Value: 480 Ohm
Lead Channel Pacing Threshold Amplitude: 0.625 V
Lead Channel Pacing Threshold Pulse Width: 0.4 ms
Lead Channel Sensing Intrinsic Amplitude: 2.7 mV
Lead Channel Sensing Intrinsic Amplitude: 7.8 mV
MDC IDC MSMT LEADCHNL RV PACING THRESHOLD AMPLITUDE: 1 V
MDC IDC MSMT LEADCHNL RV PACING THRESHOLD PULSEWIDTH: 0.4 ms
MDC IDC SESS DTM: 20160809070137
MDC IDC SET LEADCHNL RA PACING AMPLITUDE: 1.625
MDC IDC SET LEADCHNL RV PACING AMPLITUDE: 1.25 V
MDC IDC SET LEADCHNL RV PACING PULSEWIDTH: 0.4 ms
MDC IDC SET LEADCHNL RV SENSING SENSITIVITY: 4 mV
MDC IDC STAT BRADY AS VP PERCENT: 45 %
MDC IDC STAT BRADY AS VS PERCENT: 1 %
Pulse Gen Serial Number: 3013631

## 2015-02-25 ENCOUNTER — Ambulatory Visit (HOSPITAL_BASED_OUTPATIENT_CLINIC_OR_DEPARTMENT_OTHER): Payer: Medicare Other

## 2015-02-25 ENCOUNTER — Other Ambulatory Visit (HOSPITAL_BASED_OUTPATIENT_CLINIC_OR_DEPARTMENT_OTHER): Payer: Medicare Other

## 2015-02-25 DIAGNOSIS — D469 Myelodysplastic syndrome, unspecified: Secondary | ICD-10-CM

## 2015-02-25 DIAGNOSIS — D630 Anemia in neoplastic disease: Secondary | ICD-10-CM

## 2015-02-25 DIAGNOSIS — D631 Anemia in chronic kidney disease: Secondary | ICD-10-CM

## 2015-02-25 DIAGNOSIS — N183 Chronic kidney disease, stage 3 (moderate): Secondary | ICD-10-CM

## 2015-02-25 LAB — CBC WITH DIFFERENTIAL/PLATELET
BASO%: 1.5 % (ref 0.0–2.0)
Basophils Absolute: 0.1 10*3/uL (ref 0.0–0.1)
EOS%: 4.1 % (ref 0.0–7.0)
Eosinophils Absolute: 0.2 10*3/uL (ref 0.0–0.5)
HCT: 30.4 % — ABNORMAL LOW (ref 38.4–49.9)
HEMOGLOBIN: 9.7 g/dL — AB (ref 13.0–17.1)
LYMPH%: 19.9 % (ref 14.0–49.0)
MCH: 31.3 pg (ref 27.2–33.4)
MCHC: 32 g/dL (ref 32.0–36.0)
MCV: 97.9 fL (ref 79.3–98.0)
MONO#: 0.5 10*3/uL (ref 0.1–0.9)
MONO%: 8.9 % (ref 0.0–14.0)
NEUT%: 65.6 % (ref 39.0–75.0)
NEUTROS ABS: 3.3 10*3/uL (ref 1.5–6.5)
Platelets: 79 10*3/uL — ABNORMAL LOW (ref 140–400)
RBC: 3.11 10*6/uL — AB (ref 4.20–5.82)
RDW: 26.7 % — ABNORMAL HIGH (ref 11.0–14.6)
WBC: 5.1 10*3/uL (ref 4.0–10.3)
lymph#: 1 10*3/uL (ref 0.9–3.3)

## 2015-02-25 MED ORDER — DARBEPOETIN ALFA 500 MCG/ML IJ SOSY
500.0000 ug | PREFILLED_SYRINGE | Freq: Once | INTRAMUSCULAR | Status: AC
  Administered 2015-02-25: 500 ug via SUBCUTANEOUS
  Filled 2015-02-25: qty 1

## 2015-03-11 ENCOUNTER — Ambulatory Visit: Payer: Medicare Other

## 2015-03-11 ENCOUNTER — Encounter (HOSPITAL_COMMUNITY): Payer: Self-pay | Admitting: Emergency Medicine

## 2015-03-11 ENCOUNTER — Other Ambulatory Visit: Payer: Medicare Other

## 2015-03-11 ENCOUNTER — Encounter: Payer: Self-pay | Admitting: Cardiology

## 2015-03-11 ENCOUNTER — Telehealth: Payer: Self-pay | Admitting: Hematology and Oncology

## 2015-03-11 ENCOUNTER — Inpatient Hospital Stay (HOSPITAL_COMMUNITY)
Admission: EM | Admit: 2015-03-11 | Discharge: 2015-03-14 | DRG: 394 | Disposition: A | Discharge status: Home Health | Payer: Medicare Other | Attending: Internal Medicine | Admitting: Internal Medicine

## 2015-03-11 DIAGNOSIS — K625 Hemorrhage of anus and rectum: Secondary | ICD-10-CM | POA: Diagnosis not present

## 2015-03-11 DIAGNOSIS — N189 Chronic kidney disease, unspecified: Secondary | ICD-10-CM | POA: Diagnosis present

## 2015-03-11 DIAGNOSIS — D509 Iron deficiency anemia, unspecified: Secondary | ICD-10-CM | POA: Diagnosis present

## 2015-03-11 DIAGNOSIS — Y842 Radiological procedure and radiotherapy as the cause of abnormal reaction of the patient, or of later complication, without mention of misadventure at the time of the procedure: Secondary | ICD-10-CM | POA: Diagnosis present

## 2015-03-11 DIAGNOSIS — I129 Hypertensive chronic kidney disease with stage 1 through stage 4 chronic kidney disease, or unspecified chronic kidney disease: Secondary | ICD-10-CM | POA: Diagnosis present

## 2015-03-11 DIAGNOSIS — Z87891 Personal history of nicotine dependence: Secondary | ICD-10-CM | POA: Diagnosis not present

## 2015-03-11 DIAGNOSIS — K645 Perianal venous thrombosis: Secondary | ICD-10-CM | POA: Diagnosis present

## 2015-03-11 DIAGNOSIS — I714 Abdominal aortic aneurysm, without rupture, unspecified: Secondary | ICD-10-CM | POA: Diagnosis present

## 2015-03-11 DIAGNOSIS — R946 Abnormal results of thyroid function studies: Secondary | ICD-10-CM | POA: Diagnosis present

## 2015-03-11 DIAGNOSIS — Z8249 Family history of ischemic heart disease and other diseases of the circulatory system: Secondary | ICD-10-CM

## 2015-03-11 DIAGNOSIS — Z880 Allergy status to penicillin: Secondary | ICD-10-CM | POA: Diagnosis not present

## 2015-03-11 DIAGNOSIS — I739 Peripheral vascular disease, unspecified: Secondary | ICD-10-CM | POA: Diagnosis present

## 2015-03-11 DIAGNOSIS — Z8546 Personal history of malignant neoplasm of prostate: Secondary | ICD-10-CM

## 2015-03-11 DIAGNOSIS — D469 Myelodysplastic syndrome, unspecified: Secondary | ICD-10-CM | POA: Diagnosis present

## 2015-03-11 DIAGNOSIS — E039 Hypothyroidism, unspecified: Secondary | ICD-10-CM | POA: Diagnosis present

## 2015-03-11 DIAGNOSIS — Z8679 Personal history of other diseases of the circulatory system: Secondary | ICD-10-CM | POA: Diagnosis not present

## 2015-03-11 DIAGNOSIS — N183 Chronic kidney disease, stage 3 unspecified: Secondary | ICD-10-CM | POA: Diagnosis present

## 2015-03-11 DIAGNOSIS — Z885 Allergy status to narcotic agent status: Secondary | ICD-10-CM | POA: Diagnosis not present

## 2015-03-11 DIAGNOSIS — G629 Polyneuropathy, unspecified: Secondary | ICD-10-CM | POA: Diagnosis present

## 2015-03-11 DIAGNOSIS — I442 Atrioventricular block, complete: Secondary | ICD-10-CM | POA: Diagnosis present

## 2015-03-11 DIAGNOSIS — I5022 Chronic systolic (congestive) heart failure: Secondary | ICD-10-CM | POA: Diagnosis present

## 2015-03-11 DIAGNOSIS — Z79899 Other long term (current) drug therapy: Secondary | ICD-10-CM

## 2015-03-11 DIAGNOSIS — Z950 Presence of cardiac pacemaker: Secondary | ICD-10-CM

## 2015-03-11 DIAGNOSIS — K921 Melena: Secondary | ICD-10-CM | POA: Diagnosis present

## 2015-03-11 DIAGNOSIS — K627 Radiation proctitis: Secondary | ICD-10-CM | POA: Diagnosis present

## 2015-03-11 DIAGNOSIS — E1122 Type 2 diabetes mellitus with diabetic chronic kidney disease: Secondary | ICD-10-CM | POA: Diagnosis present

## 2015-03-11 DIAGNOSIS — I447 Left bundle-branch block, unspecified: Secondary | ICD-10-CM | POA: Diagnosis present

## 2015-03-11 DIAGNOSIS — D630 Anemia in neoplastic disease: Secondary | ICD-10-CM | POA: Diagnosis present

## 2015-03-11 DIAGNOSIS — D631 Anemia in chronic kidney disease: Secondary | ICD-10-CM | POA: Diagnosis present

## 2015-03-11 DIAGNOSIS — D696 Thrombocytopenia, unspecified: Secondary | ICD-10-CM | POA: Diagnosis present

## 2015-03-11 LAB — CBC
HCT: 29.2 % — ABNORMAL LOW (ref 39.0–52.0)
HEMOGLOBIN: 9.6 g/dL — AB (ref 13.0–17.0)
MCH: 31.2 pg (ref 26.0–34.0)
MCHC: 32.9 g/dL (ref 30.0–36.0)
MCV: 94.8 fL (ref 78.0–100.0)
PLATELETS: 61 10*3/uL — AB (ref 150–400)
RBC: 3.08 MIL/uL — AB (ref 4.22–5.81)
RDW: 24.1 % — ABNORMAL HIGH (ref 11.5–15.5)
WBC: 4.3 10*3/uL (ref 4.0–10.5)

## 2015-03-11 LAB — PREPARE RBC (CROSSMATCH)

## 2015-03-11 LAB — COMPREHENSIVE METABOLIC PANEL
ALT: 55 U/L (ref 17–63)
ANION GAP: 8 (ref 5–15)
AST: 64 U/L — ABNORMAL HIGH (ref 15–41)
Albumin: 3.6 g/dL (ref 3.5–5.0)
Alkaline Phosphatase: 247 U/L — ABNORMAL HIGH (ref 38–126)
BUN: 28 mg/dL — ABNORMAL HIGH (ref 6–20)
CHLORIDE: 104 mmol/L (ref 101–111)
CO2: 24 mmol/L (ref 22–32)
Calcium: 9.1 mg/dL (ref 8.9–10.3)
Creatinine, Ser: 1.58 mg/dL — ABNORMAL HIGH (ref 0.61–1.24)
GFR, EST AFRICAN AMERICAN: 42 mL/min — AB (ref >60)
GFR, EST NON AFRICAN AMERICAN: 37 mL/min — AB (ref >60)
Glucose, Bld: 100 mg/dL — ABNORMAL HIGH (ref 65–99)
POTASSIUM: 4.4 mmol/L (ref 3.5–5.1)
SODIUM: 136 mmol/L (ref 135–145)
Total Bilirubin: 1.5 mg/dL — ABNORMAL HIGH (ref 0.3–1.2)
Total Protein: 6.8 g/dL (ref 6.5–8.1)

## 2015-03-11 LAB — MRSA PCR SCREENING: MRSA by PCR: NEGATIVE

## 2015-03-11 LAB — HEPATIC FUNCTION PANEL
ALT: 56 U/L (ref 17–63)
AST: 64 U/L — ABNORMAL HIGH (ref 15–41)
Albumin: 3.6 g/dL (ref 3.5–5.0)
Alkaline Phosphatase: 243 U/L — ABNORMAL HIGH (ref 38–126)
BILIRUBIN DIRECT: 0.3 mg/dL (ref 0.1–0.5)
BILIRUBIN TOTAL: 1.5 mg/dL — AB (ref 0.3–1.2)
Indirect Bilirubin: 1.2 mg/dL — ABNORMAL HIGH (ref 0.3–0.9)
Total Protein: 6.8 g/dL (ref 6.5–8.1)

## 2015-03-11 LAB — ABO/RH: ABO/RH(D): A POS

## 2015-03-11 LAB — TROPONIN I: Troponin I: <0.03 ng/mL (ref <0.031)

## 2015-03-11 LAB — TSH: TSH: 7.643 u[IU]/mL — AB (ref 0.350–4.500)

## 2015-03-11 LAB — MAGNESIUM: MAGNESIUM: 2 mg/dL (ref 1.7–2.4)

## 2015-03-11 MED ORDER — LEVALBUTEROL HCL 0.63 MG/3ML IN NEBU: 0.6300 mg | INHALATION_SOLUTION | Freq: Four times a day (QID) | RESPIRATORY_TRACT | Status: DC | PRN

## 2015-03-11 MED ORDER — ONDANSETRON HCL 4 MG/2ML IJ SOLN: 4.0000 mg | Freq: Four times a day (QID) | INTRAMUSCULAR | Status: DC | PRN

## 2015-03-11 MED ORDER — SODIUM CHLORIDE 0.9 % IV SOLN
Freq: Once | INTRAVENOUS | Status: AC
  Administered 2015-03-11: 17:00:00 via INTRAVENOUS

## 2015-03-11 MED ORDER — ACETAMINOPHEN 650 MG RE SUPP: 650.0000 mg | Freq: Four times a day (QID) | RECTAL | Status: DC | PRN

## 2015-03-11 MED ORDER — SODIUM CHLORIDE 0.9 % IJ SOLN
3.0000 mL | Freq: Two times a day (BID) | INTRAMUSCULAR | Status: DC
Start: 2015-03-11 — End: 2015-03-14
  Administered 2015-03-12: 3 mL via INTRAVENOUS

## 2015-03-11 MED ORDER — ONDANSETRON HCL 4 MG PO TABS: 4.0000 mg | ORAL_TABLET | Freq: Four times a day (QID) | ORAL | Status: DC | PRN

## 2015-03-11 MED ORDER — ACETAMINOPHEN 325 MG PO TABS: 650.0000 mg | ORAL_TABLET | Freq: Four times a day (QID) | ORAL | Status: DC | PRN

## 2015-03-11 MED ORDER — POTASSIUM CHLORIDE IN NACL 20-0.9 MEQ/L-% IV SOLN
INTRAVENOUS | Status: DC
  Administered 2015-03-11 – 2015-03-12 (×2): via INTRAVENOUS
  Filled 2015-03-11 (×5): qty 1000

## 2015-03-11 MED ORDER — LEVOTHYROXINE SODIUM 25 MCG PO TABS
125.0000 ug | ORAL_TABLET | Freq: Every day | ORAL | Status: DC
  Administered 2015-03-12 – 2015-03-14 (×3): 125 ug via ORAL
  Filled 2015-03-11 (×6): qty 1

## 2015-03-11 NOTE — Telephone Encounter (Signed)
returned call and cx appt pt in the hospital

## 2015-03-11 NOTE — H&P (Signed)
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 In 2014 required flexible sigmoidoscopy and ABC cautery with Dr. Benson Norway for an episode of bleeding. He denies any chest pain or dizziness. No significant drop in his hemoglobin, baseline hemoglobin is around 10, platelets 61,000.      Review of Systems: negative for the following  Constitutional: Denies fever, chills, diaphoresis, appetite change and fatigue.  HEENT: Denies photophobia, eye pain, redness, hearing loss, ear pain, congestion, sore throat, rhinorrhea, sneezing, mouth sores, trouble swallowing, neck pain, neck stiffness and tinnitus.  Respiratory: Denies SOB, DOE, cough, chest tightness, and wheezing.  Cardiovascular: Denies chest pain, palpitations and leg swelling.  Gastrointestinal: Positive for blood in stool and anal bleeding. Negative for nausea, vomiting, abdominal pain, diarrhea and abdominal distention. Genitourinary: Denies dysuria, urgency, frequency, hematuria, flank pain and difficulty urinating.  Musculoskeletal: Denies myalgias, back pain, joint swelling, arthralgias and gait problem.  Skin: Denies pallor, rash and wound.  Neurological: Denies dizziness, seizures, syncope, weakness, light-headedness, numbness and headaches.  Hematological: Denies adenopathy. Easy bruising, personal or family bleeding history  Psychiatric/Behavioral: Denies suicidal ideation, mood  changes, confusion, nervousness, sleep disturbance and agitation       Past Medical History  Diagnosis Date  . Hypertension   . LBBB (left bundle branch block)   . Diabetes mellitus without complication   . Cancer   . Hypothyroidism     nodule  . Cataracts, bilateral   . Anemia     iron deficiency anemia  . Inguinal hernia   . Peripheral vascular disease     Peripheral neuropathy  . Complete heart block   . MDS (myelodysplastic syndrome) 01/05/2014     Past Surgical History  Procedure Laterality Date  . Tonsillectomy    . Hiatal hernia repair    . Hernia repair  1979    bilateral inguinal hernia  . Thyroid lobectomy  6/85    right  . Acromionectomy  1998    rotator cuff repair  . Prostate surgery    . Abdominal aortic aneurysm repair    . Tonsillectomy    . Flexible sigmoidoscopy N/A 12/13/2012    Procedure: FLEXIBLE SIGMOIDOSCOPY;  Surgeon: Beryle Beams, MD;  Location: [facility];  Service: Endoscopy;  Laterality: N/A;  . Hot hemostasis N/A 12/13/2012    Procedure: HOT HEMOSTASIS (ARGON PLASMA COAGULATION/BICAP);  Surgeon: Beryle Beams, MD;  Location: [facility];  Service: Endoscopy;  Laterality: N/A;  . Pacemaker insertion  11-03-13    STJ dual chamber pacemaker implanted by Dr Lovena Le for CHB  . Permanent pacemaker insertion N/A 11/03/2013    Procedure: PERMANENT PACEMAKER INSERTION;  Surgeon: Evans Lance, MD;  Location: [facility] CATH LAB;  Service: Cardiovascular;  Laterality: N/A;      Social History:  reports that he quit smoking about 51 years ago. His smoking use included Pipe. He has never used smokeless tobacco. He reports that he does not drink alcohol or use illicit drugs.   Allergies  Allergen Reactions  . Codeine  Sulfate     Itching on fingers   . Penicillins     Itching on fingers    Family History  Problem Relation Age of Onset  . Heart disease Mother   . Heart disease Father   . Heart disease Son   . Hyperlipidemia Son        Prior  to Admission medications   Medication Sig Start Date End Date Taking? Authorizing Provider  acetaminophen (TYLENOL) 500 MG tablet Take 1 tablet (500 mg total) by mouth every 6 (six) hours as needed. Patient taking differently: Take 500 mg by mouth every 6 (six) hours as needed for mild pain, moderate pain or headache.  12/19/14  Yes Merryl Hacker, MD  alfuzosin (UROXATRAL) 10 MG 24 hr tablet Take 10 mg by mouth every evening.    Yes Historical Provider, MD  furosemide (LASIX) 20 MG tablet TAKE 1 TABLET DAILY Patient taking differently: TAKE 20 MG BY MOUTH DAILY 02/09/15  Yes Evans Lance, MD  ketoconazole (NIZORAL) 2 % cream Apply 1 application topically daily as needed for irritation (jock itch).  09/07/14  Yes Historical Provider, MD  lisinopril (PRINIVIL,ZESTRIL) 10 MG tablet Take 10 mg by mouth every morning.   Yes Historical Provider, MD  Multiple Vitamin (MULTIVITAMIN WITH MINERALS) TABS Take 1 tablet by mouth every morning.   Yes Historical Provider, MD  SYNTHROID 125 MCG tablet Take 125 mcg by mouth daily before breakfast.  09/15/14  Yes Historical Provider, MD  TOPROL XL 25 MG 24 hr tablet Take 25 mg by mouth daily. 12/24/13  Yes Historical Provider, MD     Physical Exam: Filed Vitals:   03/11/15 1100 03/11/15 1130 03/11/15 1230 03/11/15 1300  BP: 139/54 146/54 144/58 148/65  Pulse: 61 59 63 60  Temp:      TempSrc:      Resp: 19 17 17 15   SpO2: 96% 94% 96% 99%     Constitutional: Vital signs reviewed. Patient is a well-developed and well-nourished in no acute distress and cooperative with exam. Alert and oriented x3.  Head: Normocephalic and atraumatic  Ear: TM normal bilaterally  Mouth: no erythema or exudates, MMM  Eyes: PERRL, EOMI, conjunctivae normal, No scleral icterus.  Neck: Supple, Trachea midline normal ROM, No JVD, mass, thyromegaly, or carotid bruit present.  Cardiovascular: RRR, S1 normal, S2 normal, no MRG, pulses symmetric and intact bilaterally   Pulmonary/Chest: CTAB, no wheezes, rales, or rhonchi  Abdominal: Soft. Non-tender, non-distended, bowel sounds are normal, no masses, organomegaly, or guarding present.  GU: no CVA tenderness Musculoskeletal: No joint deformities, erythema, or stiffness, ROM full and no nontender Ext: no edema and no cyanosis, pulses palpable bilaterally (DP and PT)  Hematology: no cervical, inginal, or axillary adenopathy.  Neurological: A&O x3, Strenght is normal and symmetric bilaterally, cranial nerve II-XII are grossly intact, no focal motor deficit, sensory intact to light touch bilaterally.  Skin: Warm, dry and intact. No rash, cyanosis, or clubbing.  Psychiatric: Normal mood and affect. speech and behavior is normal. Judgment and thought content normal. Cognition and memory are normal.      Data Review   Micro Results No results found for this or any previous visit (from the past 240 hour(s)).  Radiology Reports No results found.   CBC  Recent Labs Lab 03/11/15 1133  WBC 4.3  HGB 9.6*  HCT 29.2*  PLT 61*  MCV 94.8  MCH 31.2  MCHC 32.9  RDW 24.1*    Chemistries   Recent  Labs Lab 03/11/15 1205  NA 136  K 4.4  CL 104  CO2 24  GLUCOSE 100*  BUN 28*  CREATININE 1.58*  CALCIUM 9.1  AST 64*  ALT 55  ALKPHOS 247*  BILITOT 1.5*   ------------------------------------------------------------------------------------------------------------------ CrCl cannot be calculated (Unknown ideal weight.). ------------------------------------------------------------------------------------------------------------------ No results for input(s): HGBA1C in the last 72 hours. ------------------------------------------------------------------------------------------------------------------ No results for input(s): CHOL, HDL, LDLCALC, TRIG, CHOLHDL, LDLDIRECT in the last 72  hours. ------------------------------------------------------------------------------------------------------------------ No results for input(s): TSH, T4TOTAL, T3FREE, THYROIDAB in the last 72 hours.  Invalid input(s): FREET3 ------------------------------------------------------------------------------------------------------------------ No results for input(s): VITAMINB12, FOLATE, FERRITIN, TIBC, IRON, RETICCTPCT in the last 72 hours.  Coagulation profile No results for input(s): INR, PROTIME in the last 168 hours.  No results for input(s): DDIMER in the last 72 hours.  Cardiac Enzymes No results for input(s): CKMB, TROPONINI, MYOGLOBIN in the last 168 hours.  Invalid input(s): CK ------------------------------------------------------------------------------------------------------------------ Invalid input(s): POCBNP   CBG: No results for input(s): GLUCAP in the last 168 hours.     EKG: Independently reviewed.    Assessment/Plan Active Problems: Rectal bleeding-most likely diverticular bleeding, patient also has thrombocytopenia, chronic anemia, hemoglobin stable however given history of congestive heart failure will go ahead and transfuse 2 units of packed red blood cells prophylactically and repeat CBC tonight. ED to call Dr. Benson Norway. Admit patient to step down    Anemia associated with chronic renal failure-transfuse 2 units of packed red blood cells since the ahead. If thrombocytopenia worsens will consult hematology.    Chronic systolic heart failure-patient is on Toprol-XL, Lasix and lisinopril at home, last 2-D echo on 10/31/13 showed an EF of 45-50%    Atrioventricular block, complete-hold Toprol-XL-    MDS (myelodysplastic syndrome)-followed by hematology in college he    AAA (abdominal aortic aneurysm) without rupture-followed by Dr. Bridgett Larsson    Code Status:   full Family Communication: bedside Disposition Plan: admit   Total time spent 55 minutes.Greater  than 50% of this time was spent in counseling, explanation of diagnosis, planning of further management, and coordination of care  [facility] Pager [phone]  If 7PM-7AM, please contact night-coverage [email or web address] Password [email or web address] 03/11/2015, 1:20 PM

## 2015-03-11 NOTE — ED Provider Notes (Signed)
CSN: [id number]     Arrival date & time 03/11/15  1005 History   First MD Initiated Contact with Patient 03/11/15 1018     Chief Complaint  Patient presents with  . Rectal Bleeding      HPI  Patient presents for evaluation after an episode of hematochezia at home.  Has history of baseline anemia requiring Epogen shots with a history of myelodysplastic syndrome. 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  In 2014 required flexible sigmoidoscopy and ABC cautery with Dr. Benson Norway for an episode of bleeding.  This morning had a bowel movement. No pain or discomfort. He states he did not notice blood in the toilet, passing his bowel movement. However afterwards he states it was a large volume of bright red blood. It continued to the point that he put on a pad. However he states it is improved and has minimal bleeding since coming to the hospital.  Past Medical History  Diagnosis Date  . Hypertension   . LBBB (left bundle branch block)   . Diabetes mellitus without complication   . Cancer   . Hypothyroidism     nodule  . Cataracts, bilateral   . Anemia     iron deficiency anemia  . Inguinal hernia   . Peripheral vascular disease     Peripheral neuropathy  . Complete heart block   . MDS (myelodysplastic syndrome) 01/05/2014   Past Surgical History  Procedure Laterality Date  . Tonsillectomy    . Hiatal hernia repair    . Hernia repair  1979    bilateral inguinal hernia  . Thyroid lobectomy  6/85    right  . Acromionectomy  1998    rotator cuff repair  . Prostate surgery    . Abdominal aortic aneurysm repair    . Tonsillectomy    . Flexible sigmoidoscopy N/A 12/13/2012    Procedure: FLEXIBLE SIGMOIDOSCOPY;  Surgeon: Beryle Beams, MD;  Location: [facility]OSCOPY;  Service: Endoscopy;  Laterality: N/A;  . Hot hemostasis N/A 12/13/2012    Procedure: HOT HEMOSTASIS (ARGON PLASMA COAGULATION/BICAP);   Surgeon: Beryle Beams, MD;  Location: [facility];  Service: Endoscopy;  Laterality: N/A;  . Pacemaker insertion  11-03-13    STJ dual chamber pacemaker implanted by Dr Lovena Le for CHB  . Permanent pacemaker insertion N/A 11/03/2013    Procedure: PERMANENT PACEMAKER INSERTION;  Surgeon: Evans Lance, MD;  Location: [facility] CATH LAB;  Service: Cardiovascular;  Laterality: N/A;   Family History  Problem Relation Age of Onset  . Heart disease Mother   . Heart disease Father   . Heart disease Son   . Hyperlipidemia Son    Social History  Substance Use Topics  . Smoking status: Former Smoker    Types: Pipe    Quit date: 11/27/1963  . Smokeless tobacco: Never Used  . Alcohol Use: No    Review of Systems  Constitutional: Negative for fever, chills, diaphoresis, appetite change and fatigue.  HENT: Negative for mouth sores, sore throat and trouble swallowing.   Eyes: Negative for visual disturbance.  Respiratory: Negative for cough, chest tightness, shortness of breath and wheezing.   Cardiovascular: Negative for chest pain.  Gastrointestinal: Positive for blood in stool and anal bleeding. Negative for nausea, vomiting, abdominal pain, diarrhea and abdominal distention.  Endocrine: Negative for polydipsia, polyphagia and polyuria.  Genitourinary: Negative for dysuria, frequency and hematuria.  Musculoskeletal: Negative for gait problem.  Skin: Negative for color change, pallor and rash.  Neurological: Negative for dizziness, syncope, light-headedness and headaches.  Hematological: Does not bruise/bleed easily.  Psychiatric/Behavioral: Negative for behavioral problems and confusion.      Allergies  Codeine sulfate and Penicillins  Home Medications   Prior to Admission medications   Medication Sig Start Date End Date Taking? Authorizing Provider  acetaminophen (TYLENOL) 500 MG tablet Take 1 tablet (500 mg total) by mouth every 6 (six) hours as needed. Patient taking differently:  Take 500 mg by mouth every 6 (six) hours as needed for mild pain, moderate pain or headache.  12/19/14  Yes Merryl Hacker, MD  alfuzosin (UROXATRAL) 10 MG 24 hr tablet Take 10 mg by mouth every evening.    Yes Historical Provider, MD  furosemide (LASIX) 20 MG tablet TAKE 1 TABLET DAILY Patient taking differently: TAKE 20 MG BY MOUTH DAILY 02/09/15  Yes Evans Lance, MD  ketoconazole (NIZORAL) 2 % cream Apply 1 application topically daily as needed for irritation (jock itch).  09/07/14  Yes Historical Provider, MD  lisinopril (PRINIVIL,ZESTRIL) 10 MG tablet Take 10 mg by mouth every morning.   Yes Historical Provider, MD  Multiple Vitamin (MULTIVITAMIN WITH MINERALS) TABS Take 1 tablet by mouth every morning.   Yes Historical Provider, MD  SYNTHROID 125 MCG tablet Take 125 mcg by mouth daily before breakfast.  09/15/14  Yes Historical Provider, MD  TOPROL XL 25 MG 24 hr tablet Take 25 mg by mouth daily. 12/24/13  Yes Historical Provider, MD   BP 144/58 mmHg  Pulse 63  Temp(Src) 97.6 F (36.4 C) (Oral)  Resp 17  SpO2 96% Physical Exam  Constitutional: He is oriented to person, place, and time. He appears well-developed and well-nourished. No distress.  HENT:  Head: Normocephalic.  Eyes: Conjunctivae are normal. Pupils are equal, round, and reactive to light. No scleral icterus.  Neck: Normal range of motion. Neck supple. No thyromegaly present.  Cardiovascular: Normal rate and regular rhythm.  Exam reveals no gallop and no friction rub.   No murmur heard. Pulmonary/Chest: Effort normal and breath sounds normal. No respiratory distress. He has no wheezes. He has no rales.  Abdominal: Soft. Bowel sounds are normal. He exhibits no distension. There is no tenderness. There is no rebound.  Genitourinary:     Musculoskeletal: Normal range of motion.  Neurological: He is alert and oriented to person, place, and time.  Skin: Skin is warm and dry. No rash noted.  Psychiatric: He has a normal  mood and affect. His behavior is normal.    ED Course  Procedures (including critical care time) Labs Review Labs Reviewed  CBC - Abnormal; Notable for the following:    RBC 3.08 (*)    Hemoglobin 9.6 (*)    HCT 29.2 (*)    RDW 24.1 (*)    Platelets 61 (*)    All other components within normal limits  COMPREHENSIVE METABOLIC PANEL - Abnormal; Notable for the following:    Glucose, Bld 100 (*)    BUN 28 (*)    Creatinine, Ser 1.58 (*)    AST 64 (*)    Alkaline Phosphatase 247 (*)    Total Bilirubin 1.5 (*)    GFR calc non Af Amer 37 (*)    GFR calc Af Amer 42 (*)    All other components within normal limits  POC OCCULT BLOOD, ED  TYPE AND SCREEN  ABO/RH    Imaging Review No results found. I  have personally reviewed and evaluated these images and lab results as part of my medical decision-making.   EKG Interpretation None      MDM   Final diagnoses:  Hematochezia     Hemoglobin 9.6. Close to the patient's baseline of 10.0. Platelets 61,000 close to his baseline as well. He remains asymptomatic. Not tachycardic or hemodynamically unstable. Placed a call to [facility] regarding admission to observation bed for serial hemoglobins.   I also discussed the case with Dr. Saralyn Pilar, no GI. He will see the patient in consult.   Tanna Furry, MD 03/11/15 1326

## 2015-03-11 NOTE — Consult Note (Signed)
Reason for Consult: Hematochezia Referring Physician: [facility]  Anthony Hutchinson HPI: This is a 81 year old male with a PMH of AAA s/p stenting, stenting of the bilateral aorto-iliac arteries, radiation proctitis s/p APC 2014, MDS, complete heart block s/p pacemaker, and splenomegaly with thrombocytopenia admitted with complaints of hematochezia.  He had one episode of hematochezia and it was painless.  His FFS with APC in 2014 revealed a mild to moderate radiation proctitis that was successfully treated with APC.  He states that he had another episode of hematochezia last week that was self-limited.  His HGB is relatively stable in the 9-10 range.  Dr. Alvy Bimler has been treating him with EPO every other week.  Past Medical History  Diagnosis Date  . Hypertension   . LBBB (left bundle branch block)   . Diabetes mellitus without complication   . Cancer   . Hypothyroidism     nodule  . Cataracts, bilateral   . Anemia     iron deficiency anemia  . Inguinal hernia   . Peripheral vascular disease     Peripheral neuropathy  . Complete heart block   . MDS (myelodysplastic syndrome) 01/05/2014    Past Surgical History  Procedure Laterality Date  . Tonsillectomy    . Hiatal hernia repair    . Hernia repair  1979    bilateral inguinal hernia  . Thyroid lobectomy  6/85    right  . Acromionectomy  1998    rotator cuff repair  . Prostate surgery    . Abdominal aortic aneurysm repair    . Tonsillectomy    . Flexible sigmoidoscopy N/A 12/13/2012    Procedure: FLEXIBLE SIGMOIDOSCOPY;  Surgeon: Beryle Beams, MD;  Location: [facility]OSCOPY;  Service: Endoscopy;  Laterality: N/A;  . Hot hemostasis N/A 12/13/2012    Procedure: HOT HEMOSTASIS (ARGON PLASMA COAGULATION/BICAP);  Surgeon: Beryle Beams, MD;  Location: [facility];  Service: Endoscopy;  Laterality: N/A;  . Pacemaker insertion  11-03-13    STJ dual chamber pacemaker implanted by Dr Lovena Le for CHB  . Permanent pacemaker insertion N/A  11/03/2013    Procedure: PERMANENT PACEMAKER INSERTION;  Surgeon: Evans Lance, MD;  Location: [facility] CATH LAB;  Service: Cardiovascular;  Laterality: N/A;    Family History  Problem Relation Age of Onset  . Heart disease Mother   . Heart disease Father   . Heart disease Son   . Hyperlipidemia Son     Social History:  reports that he quit smoking about 51 years ago. His smoking use included Pipe. He has never used smokeless tobacco. He reports that he does not drink alcohol or use illicit drugs.  Allergies:  Allergies  Allergen Reactions  . Codeine Sulfate     Itching on fingers   . Penicillins     Itching on fingers    Medications:  Scheduled: . sodium chloride   Intravenous Once  . [START ON 03/12/2015] levothyroxine  125 mcg Oral QAC breakfast  . sodium chloride  3 mL Intravenous Q12H   Continuous: . 0.9 % NaCl with KCl 20 mEq / L      Results for orders placed or performed during the hospital encounter of 03/11/15 (from the past 24 hour(s))  Type and screen     Status: None (Preliminary result)   Collection Time: 03/11/15 11:20 AM  Result Value Ref Range   ABO/RH(D) A POS    Antibody Screen NEG    Sample Expiration 03/14/2015  Unit Number [id number]    Blood Component Type RED CELLS,LR    Unit division 00    Status of Unit ALLOCATED    Transfusion Status OK TO TRANSFUSE    Crossmatch Result Compatible    Unit Number [id number]    Blood Component Type RED CELLS,LR    Unit division 00    Status of Unit ALLOCATED    Transfusion Status OK TO TRANSFUSE    Crossmatch Result Compatible   ABO/Rh     Status: None   Collection Time: 03/11/15 11:20 AM  Result Value Ref Range   ABO/RH(D) A POS   CBC     Status: Abnormal   Collection Time: 03/11/15 11:33 AM  Result Value Ref Range   WBC 4.3 4.0 - 10.5 K/uL   RBC 3.08 (L) 4.22 - 5.81 MIL/uL   Hemoglobin 9.6 (L) 13.0 - 17.0 g/dL   HCT 29.2 (L) 39.0 - 52.0 %   MCV 94.8 78.0 - 100.0 fL   MCH 31.2 26.0 - 34.0 pg    MCHC 32.9 30.0 - 36.0 g/dL   RDW 24.1 (H) 11.5 - 15.5 %   Platelets 61 (L) 150 - 400 K/uL  Comprehensive metabolic panel     Status: Abnormal   Collection Time: 03/11/15 12:05 PM  Result Value Ref Range   Sodium 136 135 - 145 mmol/L   Potassium 4.4 3.5 - 5.1 mmol/L   Chloride 104 101 - 111 mmol/L   CO2 24 22 - 32 mmol/L   Glucose, Bld 100 (H) 65 - 99 mg/dL   BUN 28 (H) 6 - 20 mg/dL   Creatinine, Ser 1.58 (H) 0.61 - 1.24 mg/dL   Calcium 9.1 8.9 - 10.3 mg/dL   Total Protein 6.8 6.5 - 8.1 g/dL   Albumin 3.6 3.5 - 5.0 g/dL   AST 64 (H) 15 - 41 U/L   ALT 55 17 - 63 U/L   Alkaline Phosphatase 247 (H) 38 - 126 U/L   Total Bilirubin 1.5 (H) 0.3 - 1.2 mg/dL   GFR calc non Af Amer 37 (L) >60 mL/min   GFR calc Af Amer 42 (L) >60 mL/min   Anion gap 8 5 - 15  Hepatic function panel     Status: Abnormal   Collection Time: 03/11/15  1:27 PM  Result Value Ref Range   Total Protein 6.8 6.5 - 8.1 g/dL   Albumin 3.6 3.5 - 5.0 g/dL   AST 64 (H) 15 - 41 U/L   ALT 56 17 - 63 U/L   Alkaline Phosphatase 243 (H) 38 - 126 U/L   Total Bilirubin 1.5 (H) 0.3 - 1.2 mg/dL   Bilirubin, Direct 0.3 0.1 - 0.5 mg/dL   Indirect Bilirubin 1.2 (H) 0.3 - 0.9 mg/dL  Magnesium     Status: None   Collection Time: 03/11/15  1:27 PM  Result Value Ref Range   Magnesium 2.0 1.7 - 2.4 mg/dL  TSH     Status: Abnormal   Collection Time: 03/11/15  1:27 PM  Result Value Ref Range   TSH 7.643 (H) 0.350 - 4.500 uIU/mL  Troponin I     Status: None   Collection Time: 03/11/15  1:27 PM  Result Value Ref Range   Troponin I <0.03 <0.031 ng/mL  Prepare RBC     Status: None   Collection Time: 03/11/15  1:27 PM  Result Value Ref Range   Order Confirmation ORDER PROCESSED BY BLOOD BANK      No results  found.  ROS:  As stated above in the HPI otherwise negative.  Blood pressure 164/62, pulse 60, temperature 97.6 F (36.4 C), temperature source Oral, resp. rate 16, SpO2 99 %.    PE: Gen: NAD, Alert and  Oriented HEENT:  [place]/AT, EOMI Neck: Supple, no LAD Lungs: CTA Bilaterally CV: RRR without M/G/R ABM: Soft, NTND, +BS Ext: No C/C/E  Assessment/Plan: 1) Hematochezia. 2) MDS. 3) Aorto bi-iliac stents. 4) Radiation proctitis.   From the prior abdominal scans I cannot find any evidence of diverticula.  His colonoscopy with Dr. Lajoyce Corners in 2006 did not mention any evidence of diverticula.  He may be bleeding from the radiation proctitis, but I am concerned that he may have an aorto-colonic fistula as a result of the stenting. The right aorto-iliac stent does cross close to the sigmoid colon.    Plan: 1) FFS tomorrow with possible APC.  Burtis Imhoff D 03/11/2015, 2:54 PM

## 2015-03-11 NOTE — ED Notes (Addendum)
Report called to [facility], RN.  SBAR reviewed, all questions answered.  Pt to be transported to [facility], Room 1235.

## 2015-03-11 NOTE — ED Notes (Signed)
Patient with history of anemia, requires shots to correct, c/o gross rectal bleeding onset today about 40 minutes ago, pt states blood was "pouring" from his rectum and he had to wear depends. Pt states that blood has since almost stopped, with only mild bleeding currently. Pt reports history of GI bleed requiring cauterization. Pt denies dizziness or weakness.

## 2015-03-12 ENCOUNTER — Encounter (HOSPITAL_COMMUNITY)
Admission: EM | Disposition: A | Discharge status: Home Health | Payer: Self-pay | Source: Home / Self Care | Attending: Internal Medicine

## 2015-03-12 ENCOUNTER — Encounter (HOSPITAL_COMMUNITY): Payer: Self-pay | Admitting: *Deleted

## 2015-03-12 HISTORY — PX: HOT HEMOSTASIS: SHX5433

## 2015-03-12 HISTORY — PX: FLEXIBLE SIGMOIDOSCOPY: SHX5431

## 2015-03-12 LAB — COMPREHENSIVE METABOLIC PANEL
ALT: 52 U/L (ref 17–63)
ANION GAP: 8 (ref 5–15)
AST: 58 U/L — ABNORMAL HIGH (ref 15–41)
Albumin: 3.5 g/dL (ref 3.5–5.0)
Alkaline Phosphatase: 233 U/L — ABNORMAL HIGH (ref 38–126)
BILIRUBIN TOTAL: 2.7 mg/dL — AB (ref 0.3–1.2)
BUN: 28 mg/dL — ABNORMAL HIGH (ref 6–20)
CO2: 23 mmol/L (ref 22–32)
Calcium: 8.9 mg/dL (ref 8.9–10.3)
Chloride: 106 mmol/L (ref 101–111)
Creatinine, Ser: 1.47 mg/dL — ABNORMAL HIGH (ref 0.61–1.24)
GFR calc Af Amer: 46 mL/min — ABNORMAL LOW (ref >60)
GFR, EST NON AFRICAN AMERICAN: 40 mL/min — AB (ref >60)
Glucose, Bld: 109 mg/dL — ABNORMAL HIGH (ref 65–99)
POTASSIUM: 4.5 mmol/L (ref 3.5–5.1)
Sodium: 137 mmol/L (ref 135–145)
TOTAL PROTEIN: 6.8 g/dL (ref 6.5–8.1)

## 2015-03-12 LAB — TYPE AND SCREEN
ABO/RH(D): A POS
Antibody Screen: NEGATIVE
UNIT DIVISION: 0
UNIT DIVISION: 0

## 2015-03-12 LAB — TROPONIN I

## 2015-03-12 SURGERY — SIGMOIDOSCOPY, FLEXIBLE
Anesthesia: Moderate Sedation

## 2015-03-12 MED ORDER — ALFUZOSIN HCL ER 10 MG PO TB24
10.0000 mg | ORAL_TABLET | Freq: Every day | ORAL | Status: DC
  Administered 2015-03-12 – 2015-03-13 (×2): 10 mg via ORAL
  Filled 2015-03-12 (×3): qty 1

## 2015-03-12 MED ORDER — HYDROCORTISONE ACE-PRAMOXINE 2.5-1 % RE CREA
TOPICAL_CREAM | Freq: Four times a day (QID) | RECTAL | Status: DC
  Administered 2015-03-13 – 2015-03-14 (×6): via RECTAL
  Filled 2015-03-12 (×2): qty 30

## 2015-03-12 MED ORDER — SODIUM CHLORIDE 0.9 % IV SOLN: INTRAVENOUS | Status: DC

## 2015-03-12 MED ORDER — ALFUZOSIN HCL ER 10 MG PO TB24
10.0000 mg | ORAL_TABLET | Freq: Every day | ORAL | Status: DC
  Filled 2015-03-12: qty 1

## 2015-03-12 NOTE — Progress Notes (Signed)
Pt assisted to [facility]. Pt had moderate bloody stool, with formed brown pieces of stool. Medium sized blood clot noted in stool.

## 2015-03-12 NOTE — Progress Notes (Signed)
Pt assisted to the side of the bed to use the urinal. Pt stood up and bright red blood began to drip from the rectum. Pt denies any pain at this time and denies any dizziness. External hemrrhoids noted.Pt assisted back to bed, no additional bleeding noted.

## 2015-03-12 NOTE — Progress Notes (Addendum)
[facility] PROGRESS NOTE  Anthony Hutchinson MPN:[id number] DOB: 10/11/1921 DOA: 03/11/2015 PCP: Merrilee Seashore, MD  Assessment/Plan: Active Problems:   Anemia associated with chronic renal failure   Chronic systolic heart failure   Atrioventricular block, complete   MDS (myelodysplastic syndrome)   AAA (abdominal aortic aneurysm) without rupture   Rectal bleeding     Rectal bleeding-most likely diverticular bleeding, patient also has thrombocytopenia, chronic anemia, hemoglobin stable however given history of congestive heart failure patient was transfused 2 units of packed red blood cells prophylactically and repeat hemoglobin around 12. Patient has been seen by Dr. Benson Norway. Anticipated to have a flexible sigmoidoscopy today.   Anemia associated with chronic renal failure/myelodysplastic syndrome-transfuse 2 units of packed red blood cells since the ahead. If thrombocytopenia worsens will consult hematology.   Chronic systolic heart failure-patient is on Toprol-XL, Lasix and lisinopril at home, last 2-D echo on 10/31/13 showed an EF of 45-50%. No symptoms of CHF   Atrioventricular block, complete-hold Toprol-XL-   MDS (myelodysplastic syndrome)-followed by hematology , platelets 50 today   AAA (abdominal aortic aneurysm) without rupture-followed by Dr. Bridgett Larsson. Dr. Benson Norway concerned about aorto-colonic fistula as a result of the stenting  CKD Stage III - GFR 30-59,stable  Code Status:      Code Status Orders        Start     Ordered   03/11/15 1316  Full code   Continuous     03/11/15 1317     Family Communication: family updated about patient's clinical progress Disposition Plan: Transfer to telemetry if stable overnight  Brief narrative: 81 year old male with a PMH of AAA s/p stenting, stenting of the bilateral aorto-iliac arteries, radiation proctitis s/p APC 2014, MDS, complete heart block s/p pacemaker, and splenomegaly with thrombocytopenia admitted with  complaints of hematochezia. He had one episode of hematochezia and it was painless. His FFS with APC in 2014 revealed a mild to moderate radiation proctitis that was successfully treated with APC. He states that he had another episode of hematochezia last week that was self-limited. His HGB is relatively stable in the 9-10 range. Dr. Alvy Bimler has been treating him with EPO every other week  Consultants:  Dr hung   Procedures:  None   Antibiotics: Anti-infectives    None      HPI/Subjective: Pt had moderate bloody stool, with formed brown pieces of stool. Medium sized blood clot noted in stool. Patient made nothing by mouth this morning. Patient reports having 3 bloody bowel movements overnight.  Objective: Filed Vitals:   03/12/15 0800 03/12/15 0813 03/12/15 1000 03/12/15 1200  BP: 132/32 125/62 135/48 140/96  Pulse: 61 79 78 90  Temp: 98.6 F (37 C)     TempSrc: Oral     Resp: 17 20 17 26   Height:      Weight:      SpO2: 97% 99% 97% 98%    Intake/Output Summary (Last 24 hours) at 03/12/15 1240 Last data filed at 03/12/15 0700  Gross per 24 hour  Intake   1120 ml  Output    975 ml  Net    145 ml    Exam:  General: No acute respiratory distress Lungs: Clear to auscultation bilaterally without wheezes or crackles Cardiovascular: Regular rate and rhythm without murmur gallop or rub normal S1 and S2 Abdomen: Nontender, nondistended, soft, bowel sounds positive, no rebound, no ascites, no appreciable mass Extremities: No significant cyanosis, clubbing, or edema bilateral lower extremities  Data Review   Micro Results Recent Results (from the past 240 hour(s))  MRSA PCR Screening     Status: None   Collection Time: 03/11/15  2:42 PM  Result Value Ref Range Status   MRSA by PCR NEGATIVE NEGATIVE Final    Comment:        The GeneXpert MRSA Assay (FDA approved for NASAL specimens only), is one component of a comprehensive MRSA colonization surveillance  program. It is not intended to diagnose MRSA infection nor to guide or monitor treatment for MRSA infections.     Radiology Reports No results found.   CBC  Recent Labs Lab 03/11/15 1133 03/12/15 0139  WBC 4.3 6.4  HGB 9.6* 12.2*  HCT 29.2* 35.9*  PLT 61* 50*  MCV 94.8 92.1  MCH 31.2 31.3  MCHC 32.9 34.0  RDW 24.1* 22.3*    Chemistries   Recent Labs Lab 03/11/15 1205 03/11/15 1327 03/12/15 0139  NA 136  --  137  K 4.4  --  4.5  CL 104  --  106  CO2 24  --  23  GLUCOSE 100*  --  109*  BUN 28*  --  28*  CREATININE 1.58*  --  1.47*  CALCIUM 9.1  --  8.9  MG  --  2.0  --   AST 64* 64* 58*  ALT 55 56 52  ALKPHOS 247* 243* 233*  BILITOT 1.5* 1.5* 2.7*   ------------------------------------------------------------------------------------------------------------------ estimated creatinine clearance is 31.9 mL/min (by C-G formula based on Cr of 1.47). ------------------------------------------------------------------------------------------------------------------ No results for input(s): HGBA1C in the last 72 hours. ------------------------------------------------------------------------------------------------------------------ No results for input(s): CHOL, HDL, LDLCALC, TRIG, CHOLHDL, LDLDIRECT in the last 72 hours. ------------------------------------------------------------------------------------------------------------------  Recent Labs  03/11/15 1327  TSH 7.643*   ------------------------------------------------------------------------------------------------------------------ No results for input(s): VITAMINB12, FOLATE, FERRITIN, TIBC, IRON, RETICCTPCT in the last 72 hours.  Coagulation profile No results for input(s): INR, PROTIME in the last 168 hours.  No results for input(s): DDIMER in the last 72 hours.  Cardiac Enzymes  Recent Labs Lab 03/11/15 1327 03/11/15 1948 03/12/15 0139  TROPONINI <0.03 <0.03 <0.03    ------------------------------------------------------------------------------------------------------------------ Invalid input(s): POCBNP   CBG: No results for input(s): GLUCAP in the last 168 hours.     Studies: No results found.    No results found for: HGBA1C Lab Results  Component Value Date   CREATININE 1.47* 03/12/2015       Scheduled Meds: . levothyroxine  125 mcg Oral QAC breakfast  . sodium chloride  3 mL Intravenous Q12H   Continuous Infusions: . 0.9 % NaCl with KCl 20 mEq / L 50 mL/hr at 03/11/15 2237    Active Problems:   Anemia associated with chronic renal failure   Chronic systolic heart failure   Atrioventricular block, complete   MDS (myelodysplastic syndrome)   AAA (abdominal aortic aneurysm) without rupture   Rectal bleeding    Time spent: 45 minutes   [facility] Pager [phone]. If 7PM-7AM, please contact night-coverage at [email or web address], password [facility] 03/12/2015, 12:40 PM  LOS: 1 day

## 2015-03-12 NOTE — Progress Notes (Signed)
Received report from [place] in ICU. Agree with previous shift assessment.  Anthony Hutchinson. Brigitte Pulse, RN

## 2015-03-12 NOTE — Interval H&P Note (Signed)
History and Physical Interval Note:  03/12/2015 2:21 PM  Anthony Hutchinson  has presented today for surgery, with the diagnosis of Hematochezia  The various methods of treatment have been discussed with the patient and family. After consideration of risks, benefits and other options for treatment, the patient has consented to  Procedure(s) with comments: FLEXIBLE SIGMOIDOSCOPY (N/A) - will need APC  HOT HEMOSTASIS (ARGON PLASMA COAGULATION/BICAP) (N/A) as a surgical intervention .  The patient's history has been reviewed, patient examined, no change in status, stable for surgery.  I have reviewed the patient's chart and labs.  Questions were answered to the patient's satisfaction.     [name] D

## 2015-03-12 NOTE — Op Note (Signed)
[facility] [place], [address]   FLEXIBLE SIGMOIDOSCOPY PROCEDURE REPORT  PATIENT: Anthony, Hutchinson  MR#: [id number] BIRTHDATE: 1922-02-15 , 89  yrs. old GENDER: male ENDOSCOPIST: Carol Ada, MD REFERRED BY: PROCEDURE DATE:  03/12/2015 PROCEDURE:   Sigmoidoscopy with control of bleeding ASA CLASS:   Class III INDICATIONS: Hematochezia MEDICATIONS: None  DESCRIPTION OF PROCEDURE:   After the risks benefits and alternatives of the procedure were thoroughly explained, informed consent was obtained.  Digital exam revealed no abnormalities of the rectum. The     endoscope was introduced through the anus  and advanced to the sigmoid colon , The exam was Without limitations. The quality of the prep was The overall prep quality was good. . Estimated blood loss is zero unless otherwise noted in this procedure report. The instrument was then slowly withdrawn as the mucosa was fully examined.    FINDINGS: The endoscope was advanced to the proximal sigmoid colon. No evidence of any blood was noted proximally.  Retroflexed views did reveal some mild residual radiation proctitis and closer inspection did reveal some blood.  These suspicious sites were cauterized with APC, but it was clear that these lesions were not the source of the severe hematochezia.  Repeat gross inspection of the external hemorrhoidal tissue revealed a thrombosed hemorrhoid, which was not apparent during yesterday's rectal examination.  This is the source of the hematochezia.          The scope was then withdrawn from the patient and the procedure terminated. COMPLICATIONS: There were no immediate complications.  ENDOSCOPIC IMPRESSION: 1) Thrombosed external hemorrhoids with resultant hematochezia. 2) Mild radiation proctitis s/p APC.  RECOMMENDATIONS: 1) Sitz bath. 2) Signing off.  REPEAT EXAM: eSigned:  Carol Ada, MD 03/12/2015 2:57 PM  CC:

## 2015-03-12 NOTE — Clinical Documentation Improvement (Signed)
Internal Medicine  Can the diagnosis of CKD be further specified?   CKD Stage I - GFR greater than or equal to 90  CKD Stage II - GFR 60-89  CKD Stage III - GFR 30-59  CKD Stage IV - GFR 15-29  CKD Stage V - GFR < 15  ESRD (End Stage Renal Disease)  Other condition  Unable to clinically determine  Supporting Information: : (risk factors, signs and symptoms, diagnostics, treatment)  White male  GFR ranging from 37 to 40 for the current admission  Please exercise your independent, professional judgment when responding. A specific answer is not anticipated or expected.  Thank You, [place] [phone]

## 2015-03-12 NOTE — H&P (View-Only) (Signed)
Reason for Consult: Hematochezia Referring Physician: [facility]  [name] HPI: This is a 81 year old male with a PMH of AAA s/p stenting, stenting of the bilateral aorto-iliac arteries, radiation proctitis s/p APC 2014, MDS, complete heart block s/p pacemaker, and splenomegaly with thrombocytopenia admitted with complaints of hematochezia.  He had one episode of hematochezia and it was painless.  His FFS with APC in 2014 revealed a mild to moderate radiation proctitis that was successfully treated with APC.  He states that he had another episode of hematochezia last week that was self-limited.  His HGB is relatively stable in the 9-10 range.  Dr. Alvy Bimler has been treating him with EPO every other week.  Past Medical History  Diagnosis Date  . Hypertension   . LBBB (left bundle branch block)   . Diabetes mellitus without complication   . Cancer   . Hypothyroidism     nodule  . Cataracts, bilateral   . Anemia     iron deficiency anemia  . Inguinal hernia   . Peripheral vascular disease     Peripheral neuropathy  . Complete heart block   . MDS (myelodysplastic syndrome) 01/05/2014    Past Surgical History  Procedure Laterality Date  . Tonsillectomy    . Hiatal hernia repair    . Hernia repair  1979    bilateral inguinal hernia  . Thyroid lobectomy  6/85    right  . Acromionectomy  1998    rotator cuff repair  . Prostate surgery    . Abdominal aortic aneurysm repair    . Tonsillectomy    . Flexible sigmoidoscopy N/A 12/13/2012    Procedure: FLEXIBLE SIGMOIDOSCOPY;  Surgeon: Beryle Beams, MD;  Location: [facility] ENDOSCOPY;  Service: Endoscopy;  Laterality: N/A;  . Hot hemostasis N/A 12/13/2012    Procedure: HOT HEMOSTASIS (ARGON PLASMA COAGULATION/BICAP);  Surgeon: Beryle Beams, MD;  Location: [facility];  Service: Endoscopy;  Laterality: N/A;  . Pacemaker insertion  11-03-13    STJ dual chamber pacemaker implanted by Dr Lovena Le for CHB  . Permanent pacemaker insertion N/A  11/03/2013    Procedure: PERMANENT PACEMAKER INSERTION;  Surgeon: Evans Lance, MD;  Location: [facility] CATH LAB;  Service: Cardiovascular;  Laterality: N/A;    Family History  Problem Relation Age of Onset  . Heart disease Mother   . Heart disease Father   . Heart disease Son   . Hyperlipidemia Son     Social History:  reports that he quit smoking about 51 years ago. His smoking use included Pipe. He has never used smokeless tobacco. He reports that he does not drink alcohol or use illicit drugs.  Allergies:  Allergies  Allergen Reactions  . Codeine Sulfate     Itching on fingers   . Penicillins     Itching on fingers    Medications:  Scheduled: . sodium chloride   Intravenous Once  . [START ON 03/12/2015] levothyroxine  125 mcg Oral QAC breakfast  . sodium chloride  3 mL Intravenous Q12H   Continuous: . 0.9 % NaCl with KCl 20 mEq / L      Results for orders placed or performed during the hospital encounter of 03/11/15 (from the past 24 hour(s))  Type and screen     Status: None (Preliminary result)   Collection Time: 03/11/15 11:20 AM  Result Value Ref Range   ABO/RH(D) A POS    Antibody Screen NEG    Sample Expiration 03/14/2015  Unit Number [id number]    Blood Component Type RED CELLS,LR    Unit division 00    Status of Unit ALLOCATED    Transfusion Status OK TO TRANSFUSE    Crossmatch Result Compatible    Unit Number [id number]    Blood Component Type RED CELLS,LR    Unit division 00    Status of Unit ALLOCATED    Transfusion Status OK TO TRANSFUSE    Crossmatch Result Compatible   ABO/Rh     Status: None   Collection Time: 03/11/15 11:20 AM  Result Value Ref Range   ABO/RH(D) A POS   CBC     Status: Abnormal   Collection Time: 03/11/15 11:33 AM  Result Value Ref Range   WBC 4.3 4.0 - 10.5 K/uL   RBC 3.08 (L) 4.22 - 5.81 MIL/uL   Hemoglobin 9.6 (L) 13.0 - 17.0 g/dL   HCT 29.2 (L) 39.0 - 52.0 %   MCV 94.8 78.0 - 100.0 fL   MCH 31.2 26.0 - 34.0 pg    MCHC 32.9 30.0 - 36.0 g/dL   RDW 24.1 (H) 11.5 - 15.5 %   Platelets 61 (L) 150 - 400 K/uL  Comprehensive metabolic panel     Status: Abnormal   Collection Time: 03/11/15 12:05 PM  Result Value Ref Range   Sodium 136 135 - 145 mmol/L   Potassium 4.4 3.5 - 5.1 mmol/L   Chloride 104 101 - 111 mmol/L   CO2 24 22 - 32 mmol/L   Glucose, Bld 100 (H) 65 - 99 mg/dL   BUN 28 (H) 6 - 20 mg/dL   Creatinine, Ser 1.58 (H) 0.61 - 1.24 mg/dL   Calcium 9.1 8.9 - 10.3 mg/dL   Total Protein 6.8 6.5 - 8.1 g/dL   Albumin 3.6 3.5 - 5.0 g/dL   AST 64 (H) 15 - 41 U/L   ALT 55 17 - 63 U/L   Alkaline Phosphatase 247 (H) 38 - 126 U/L   Total Bilirubin 1.5 (H) 0.3 - 1.2 mg/dL   GFR calc non Af Amer 37 (L) >60 mL/min   GFR calc Af Amer 42 (L) >60 mL/min   Anion gap 8 5 - 15  Hepatic function panel     Status: Abnormal   Collection Time: 03/11/15  1:27 PM  Result Value Ref Range   Total Protein 6.8 6.5 - 8.1 g/dL   Albumin 3.6 3.5 - 5.0 g/dL   AST 64 (H) 15 - 41 U/L   ALT 56 17 - 63 U/L   Alkaline Phosphatase 243 (H) 38 - 126 U/L   Total Bilirubin 1.5 (H) 0.3 - 1.2 mg/dL   Bilirubin, Direct 0.3 0.1 - 0.5 mg/dL   Indirect Bilirubin 1.2 (H) 0.3 - 0.9 mg/dL  Magnesium     Status: None   Collection Time: 03/11/15  1:27 PM  Result Value Ref Range   Magnesium 2.0 1.7 - 2.4 mg/dL  TSH     Status: Abnormal   Collection Time: 03/11/15  1:27 PM  Result Value Ref Range   TSH 7.643 (H) 0.350 - 4.500 uIU/mL  Troponin I     Status: None   Collection Time: 03/11/15  1:27 PM  Result Value Ref Range   Troponin I <0.03 <0.031 ng/mL  Prepare RBC     Status: None   Collection Time: 03/11/15  1:27 PM  Result Value Ref Range   Order Confirmation ORDER PROCESSED BY BLOOD BANK      No results  found.  ROS:  As stated above in the HPI otherwise negative.  Blood pressure 164/62, pulse 60, temperature 97.6 F (36.4 C), temperature source Oral, resp. rate 16, SpO2 99 %.    PE: Gen: NAD, Alert and  Oriented HEENT:  [place]/AT, EOMI Neck: Supple, no LAD Lungs: CTA Bilaterally CV: RRR without M/G/R ABM: Soft, NTND, +BS Ext: No C/C/E  Assessment/Plan: 1) Hematochezia. 2) MDS. 3) Aorto bi-iliac stents. 4) Radiation proctitis.   From the prior abdominal scans I cannot find any evidence of diverticula.  His colonoscopy with Dr. Lajoyce Corners in 2006 did not mention any evidence of diverticula.  He may be bleeding from the radiation proctitis, but I am concerned that he may have an aorto-colonic fistula as a result of the stenting. The right aorto-iliac stent does cross close to the sigmoid colon.    Plan: 1) FFS tomorrow with possible APC.  Shandrell Boda D 03/11/2015, 2:54 PM

## 2015-03-13 DIAGNOSIS — D469 Myelodysplastic syndrome, unspecified: Secondary | ICD-10-CM

## 2015-03-13 DIAGNOSIS — N183 Chronic kidney disease, stage 3 unspecified: Secondary | ICD-10-CM | POA: Diagnosis present

## 2015-03-13 DIAGNOSIS — K625 Hemorrhage of anus and rectum: Secondary | ICD-10-CM

## 2015-03-13 DIAGNOSIS — D630 Anemia in neoplastic disease: Secondary | ICD-10-CM

## 2015-03-13 DIAGNOSIS — Z950 Presence of cardiac pacemaker: Secondary | ICD-10-CM

## 2015-03-13 DIAGNOSIS — D696 Thrombocytopenia, unspecified: Secondary | ICD-10-CM

## 2015-03-13 DIAGNOSIS — I442 Atrioventricular block, complete: Secondary | ICD-10-CM

## 2015-03-13 DIAGNOSIS — I5022 Chronic systolic (congestive) heart failure: Secondary | ICD-10-CM

## 2015-03-13 DIAGNOSIS — D631 Anemia in chronic kidney disease: Secondary | ICD-10-CM

## 2015-03-13 DIAGNOSIS — I714 Abdominal aortic aneurysm, without rupture: Secondary | ICD-10-CM

## 2015-03-13 LAB — CBC
HEMATOCRIT: 34.4 % — AB (ref 39.0–52.0)
HEMATOCRIT: 35.9 % — AB (ref 39.0–52.0)
HEMOGLOBIN: 12.2 g/dL — AB (ref 13.0–17.0)
Hemoglobin: 11.4 g/dL — ABNORMAL LOW (ref 13.0–17.0)
MCH: 30.7 pg (ref 26.0–34.0)
MCH: 31.3 pg (ref 26.0–34.0)
MCHC: 33.1 g/dL (ref 30.0–36.0)
MCHC: 34 g/dL (ref 30.0–36.0)
MCV: 92.1 fL (ref 78.0–100.0)
MCV: 92.7 fL (ref 78.0–100.0)
PLATELETS: 51 10*3/uL — AB (ref 150–400)
Platelets: 50 10*3/uL — ABNORMAL LOW (ref 150–400)
RBC: 3.71 MIL/uL — AB (ref 4.22–5.81)
RBC: 3.9 MIL/uL — AB (ref 4.22–5.81)
RDW: 22.5 % — ABNORMAL HIGH (ref 11.5–15.5)
RDW: 22.3 % — ABNORMAL HIGH (ref 11.5–15.5)
WBC: 6.4 10*3/uL (ref 4.0–10.5)
WBC: 8 10*3/uL (ref 4.0–10.5)

## 2015-03-13 LAB — COMPREHENSIVE METABOLIC PANEL
ALK PHOS: 207 U/L — AB (ref 38–126)
ALT: 43 U/L (ref 17–63)
AST: 48 U/L — AB (ref 15–41)
Albumin: 3.2 g/dL — ABNORMAL LOW (ref 3.5–5.0)
Anion gap: 6 (ref 5–15)
BILIRUBIN TOTAL: 1.7 mg/dL — AB (ref 0.3–1.2)
BUN: 37 mg/dL — AB (ref 6–20)
CALCIUM: 8.3 mg/dL — AB (ref 8.9–10.3)
CHLORIDE: 106 mmol/L (ref 101–111)
CO2: 23 mmol/L (ref 22–32)
CREATININE: 1.68 mg/dL — AB (ref 0.61–1.24)
GFR, EST AFRICAN AMERICAN: 39 mL/min — AB (ref >60)
GFR, EST NON AFRICAN AMERICAN: 34 mL/min — AB (ref >60)
Glucose, Bld: 123 mg/dL — ABNORMAL HIGH (ref 65–99)
Potassium: 4.1 mmol/L (ref 3.5–5.1)
Sodium: 135 mmol/L (ref 135–145)
TOTAL PROTEIN: 6.5 g/dL (ref 6.5–8.1)

## 2015-03-13 LAB — T4, FREE: FREE T4: 1.05 ng/dL (ref 0.61–1.12)

## 2015-03-13 MED ORDER — DARBEPOETIN ALFA 300 MCG/0.6ML IJ SOSY
500.0000 ug | PREFILLED_SYRINGE | Freq: Once | INTRAMUSCULAR | Status: AC
  Administered 2015-03-13: 500 ug via SUBCUTANEOUS
  Filled 2015-03-13 (×2): qty 0.6

## 2015-03-13 MED ORDER — DARBEPOETIN ALFA 100 MCG/0.5ML IJ SOSY: 500.0000 ug | PREFILLED_SYRINGE | Freq: Once | INTRAMUSCULAR | Status: DC

## 2015-03-13 MED ORDER — DARBEPOETIN ALFA 100 MCG/0.5ML IJ SOSY
500.0000 ug | PREFILLED_SYRINGE | Freq: Once | INTRAMUSCULAR | Status: DC
  Filled 2015-03-13: qty 2.5

## 2015-03-13 NOTE — Progress Notes (Signed)
Pt with large BM that appeared to be composed mostly of blood and clots. Primary NP on call made aware and per his request MD on call for GI was also notified. Orders given for am labs to be drawn at this time and critical results to be called back to primary NP on call. Will continue to monitor

## 2015-03-13 NOTE — Progress Notes (Signed)
Progress Note   Anthony Hutchinson [id number]:[id number] DOB: [birth year] DOA: 03/11/2015 PCP: Merrilee Seashore, MD   Brief Narrative:   Anthony Hutchinson is an 81 y.o. male with a PMH of AAA s/p stenting, stenting of the bilateral aorto-iliac arteries, radiation proctitis s/p APC 2014, MDS with chronic anemia treated with Epogen injections every other week, complete heart block s/p pacemaker, and splenomegaly with thrombocytopenia who was admitted 03/11/15 with complaints of hematochezia.  Assessment/Plan:   Principal Problem:   Rectal bleeding secondary to thrombosed external hemorrhoids and mild radiation proctitis status post APC - Patient underwent sigmoidoscopy with APC by Dr. Benson Norway 03/12/15. - Continue sitz baths. - Hemoglobin stable at 11.4.  Active Problems:   Multifactorial anemia: Anemia associated with chronic renal failure/Anemia in neoplastic disease/acute blood loss - Continue to monitor hemoglobin, remains stable and without need for transfusion. - Gets Aranesp every 2 weeks. Missed his dose last week. We'll give here.    Chronic systolic heart failure - Compensated at present.    Atrioventricular block, complete / status post pacemaker - Stable.    MDS (myelodysplastic syndrome) with Thrombocytopenia and anemia  - Monitoring CBC.    AAA (abdominal aortic aneurysm) without rupture - No evidence of rupture. Control blood pressure.    Stage III chronic kidney disease - Current creatinine consistent with usual baseline values.    Hypothyroidism  - Continue Synthroid. TSH elevated. Check free T4.    DVT Prophylaxis - SCDs only.  Family Communication: Son updated at the bedside Disposition Plan: Home 03/14/15 if hemoglobin remains stable and there is no evidence of significant further rectal bleeding. Code Status:     Code Status Orders        Start     Ordered   03/11/15 1316  Full code   Continuous     03/11/15 1317        IV Access:    Peripheral  IV   Procedures and diagnostic studies:   Flexible sigmoidoscopy 03/12/15  ENDOSCOPIC IMPRESSION: 1) Thrombosed external hemorrhoids with resultant hematochezia. 2) Mild radiation proctitis s/p APC.  RECOMMENDATIONS: 1) Sitz bath. 2) Signing off.  Medical Consultants:    Gastroenterology: Dr. Carol Ada  Anti-Infectives:    None.  Subjective:   Anthony Hutchinson denies dyspnea, dizziness, chest pain.  He had a large BM with blood/clots this a.m. Per RN.  Appetite good.  Objective:    Filed Vitals:   03/12/15 1600 03/12/15 1807 03/12/15 1957 03/13/15 0452  BP: 156/60 152/73 147/71 126/59  Pulse: 69 82 87 75  Temp: 97.9 F (36.6 C) 97.8 F (36.6 C) 97.7 F (36.5 C) 98.4 F (36.9 C)  TempSrc: Oral Oral Oral Oral  Resp: 19 21 19 18   Height:      Weight:      SpO2: 98% 100% 99% 96%    Intake/Output Summary (Last 24 hours) at 03/13/15 1256 Last data filed at 03/13/15 0746  Gross per 24 hour  Intake 449.67 ml  Output    400 ml  Net  49.67 ml   Filed Weights   03/11/15 1400 03/12/15 0323  Weight: 69.3 kg (152 lb 12.5 oz) 69 kg (152 lb 1.9 oz)    Exam: Gen:  NAD Cardiovascular:  RRR, No M/R/G Respiratory:  Lungs CTAB Gastrointestinal:  Abdomen soft, NT/ND, + BS Extremities:  No C/E/C   Data Reviewed:    Labs: Basic Metabolic Panel:  Recent Labs Lab 03/11/15 1205 03/11/15 1327 03/12/15 0139 03/13/15 0055  NA 136  --  137 135  K 4.4  --  4.5 4.1  CL 104  --  106 106  CO2 24  --  23 23  GLUCOSE 100*  --  109* 123*  BUN 28*  --  28* 37*  CREATININE 1.58*  --  1.47* 1.68*  CALCIUM 9.1  --  8.9 8.3*  MG  --  2.0  --   --    GFR Estimated Creatinine Clearance: 28 mL/min (by C-G formula based on Cr of 1.68). Liver Function Tests:  Recent Labs Lab 03/11/15 1205 03/11/15 1327 03/12/15 0139 03/13/15 0055  AST 64* 64* 58* 48*  ALT 55 56 52 43  ALKPHOS 247* 243* 233* 207*  BILITOT 1.5* 1.5* 2.7* 1.7*  PROT 6.8 6.8 6.8 6.5  ALBUMIN 3.6  3.6 3.5 3.2*   CBC:  Recent Labs Lab 03/11/15 1133 03/12/15 0139 03/13/15 0055  WBC 4.3 6.4 8.0  HGB 9.6* 12.2* 11.4*  HCT 29.2* 35.9* 34.4*  MCV 94.8 92.1 92.7  PLT 61* 50* 51*   Cardiac Enzymes:  Recent Labs Lab 03/11/15 1327 03/11/15 1948 03/12/15 0139  TROPONINI <0.03 <0.03 <0.03   Thyroid function studies:  Recent Labs  03/11/15 1327  TSH 7.643*   Sepsis Labs:  Recent Labs Lab 03/11/15 1133 03/12/15 0139 03/13/15 0055  WBC 4.3 6.4 8.0   Microbiology Recent Results (from the past 240 hour(s))  MRSA PCR Screening     Status: None   Collection Time: 03/11/15  2:42 PM  Result Value Ref Range Status   MRSA by PCR NEGATIVE NEGATIVE Final    Comment:        The GeneXpert MRSA Assay (FDA approved for NASAL specimens only), is one component of a comprehensive MRSA colonization surveillance program. It is not intended to diagnose MRSA infection nor to guide or monitor treatment for MRSA infections.      Medications:   . alfuzosin  10 mg Oral Q supper  . hydrocortisone-pramoxine   Rectal QID  . levothyroxine  125 mcg Oral QAC breakfast  . sodium chloride  3 mL Intravenous Q12H   Continuous Infusions: . 0.9 % NaCl with KCl 20 mEq / L 50 mL/hr at 03/12/15 2047    Time spent: 25 minutes.   LOS: 2 days   Shakim Faith  [facility] Pager [phone]. If unable to reach me by pager, please call my cell phone at [phone].  *Please refer to [email or web address], password [email or web address] to get updated schedule on who will round on this patient, as hospitalists switch teams weekly. If 7PM-7AM, please contact night-coverage at [email or web address], password [email or web address] for any overnight needs.  03/13/2015, 12:56 PM

## 2015-03-14 ENCOUNTER — Encounter (HOSPITAL_COMMUNITY): Payer: Self-pay | Admitting: Gastroenterology

## 2015-03-14 LAB — CBC
HCT: 32.9 % — ABNORMAL LOW (ref 39.0–52.0)
HEMOGLOBIN: 10.8 g/dL — AB (ref 13.0–17.0)
MCH: 31 pg (ref 26.0–34.0)
MCHC: 32.8 g/dL (ref 30.0–36.0)
MCV: 94.5 fL (ref 78.0–100.0)
Platelets: 44 10*3/uL — ABNORMAL LOW (ref 150–400)
RBC: 3.48 MIL/uL — AB (ref 4.22–5.81)
RDW: 22.4 % — ABNORMAL HIGH (ref 11.5–15.5)
WBC: 4.7 10*3/uL (ref 4.0–10.5)

## 2015-03-14 MED ORDER — HYDROCORTISONE ACE-PRAMOXINE 2.5-1 % RE CREA: TOPICAL_CREAM | Freq: Four times a day (QID) | RECTAL | Status: DC

## 2015-03-14 NOTE — Discharge Summary (Signed)
Physician Discharge Summary  Anthony Hutchinson [id number] DOB: 1922/01/16 DOA: 03/11/2015  PCP: Merrilee Seashore, MD  Admit date: 03/11/2015 Discharge date: 03/14/2015   Recommendations for Outpatient Follow-Up:   1. Recommend close follow-up of CBC to ensure stable hemoglobin. 2. Home health PT ordered/set up prior to discharge.   Discharge Diagnosis:   Principal Problem:    Rectal bleeding secondary to thrombosed external hemorrhoids and mild radiation proctitis status post APC Active Problems:    Anemia associated with chronic renal failure    Anemia in neoplastic disease    Chronic systolic heart failure    Atrioventricular block, complete    MDS (myelodysplastic syndrome)    Thrombocytopenia    Pacemaker    AAA (abdominal aortic aneurysm) without rupture    Stage III chronic kidney disease   Discharge disposition:  Home.    Discharge Condition: Improved.  Diet recommendation: Low sodium, heart healthy.    History of Present Illness:   Anthony Hutchinson is an 81 y.o. male with a PMH of AAA s/p stenting, stenting of the bilateral aorto-iliac arteries, radiation proctitis s/p APC 2014, MDS with chronic anemia treated with Epogen injections every other week, complete heart block s/p pacemaker, and splenomegaly with thrombocytopenia who was admitted 03/11/15 with complaints of hematochezia.   Hospital Course by Problem:   Principal Problem:  Rectal bleeding secondary to thrombosed external hemorrhoids and mild radiation proctitis status post APC - Patient underwent sigmoidoscopy with APC by Dr. Benson Norway 03/12/15. - Continue sitz baths and hydrocortisone cream to rectal area for hemorrhoids. - Hemoglobin stable at discharge with no further complaints of rectal bleeding.  Active Problems:  Multifactorial anemia: Anemia associated with chronic renal failure/Anemia in neoplastic disease/acute blood loss - Continue to monitor hemoglobin, remains stable and without need  for transfusion. - Gets Aranesp every 2 weeks. Missed his dose last week. Given his dose 03/13/15.   Chronic systolic heart failure - Compensated at present.   Atrioventricular block, complete / status post pacemaker - Stable.   MDS (myelodysplastic syndrome) with Thrombocytopenia and anemia  - CBC stable.   AAA (abdominal aortic aneurysm) without rupture - No evidence of rupture. Control blood pressure.   Stage III chronic kidney disease - Current creatinine consistent with usual baseline values.   Hypothyroidism  - Continue Synthroid. TSH elevated. Free T4 WNL.   Medical Consultants:    Gastroenterology: Dr. Carol Ada   Discharge Exam:   Filed Vitals:   03/14/15 0605  BP: 126/61  Pulse: 85  Temp: 98.2 F (36.8 C)  Resp: 18   Filed Vitals:   03/13/15 0452 03/13/15 1407 03/13/15 2100 03/14/15 0605  BP: 126/59 117/56 147/52 126/61  Pulse: 75 71 81 85  Temp: 98.4 F (36.9 C) 98.7 F (37.1 C) 98.1 F (36.7 C) 98.2 F (36.8 C)  TempSrc: Oral Oral Oral Oral  Resp: 18 18 18 18   Height:      Weight:      SpO2: 96% 99% 98% 97%    Gen:  NAD Cardiovascular:  RRR, No M/R/G Respiratory: Lungs CTAB Gastrointestinal: Abdomen soft, NT/ND with normal active bowel sounds. Extremities: No C/E/C   The results of significant diagnostics from this hospitalization (including imaging, microbiology, ancillary and laboratory) are listed below for reference.     Procedures and Diagnostic Studies:   Flexible sigmoidoscopy 03/12/15  ENDOSCOPIC IMPRESSION: 1) Thrombosed external hemorrhoids with resultant hematochezia. 2) Mild radiation proctitis s/p APC.  RECOMMENDATIONS: 1) Sitz bath. 2) Signing off.   Labs:  Basic Metabolic Panel:  Recent Labs Lab 03/11/15 1205 03/11/15 1327 03/12/15 0139 03/13/15 0055  NA 136  --  137 135  K 4.4  --  4.5 4.1  CL 104  --  106 106  CO2 24  --  23 23  GLUCOSE 100*  --  109* 123*  BUN 28*  --  28* 37*    CREATININE 1.58*  --  1.47* 1.68*  CALCIUM 9.1  --  8.9 8.3*  MG  --  2.0  --   --    GFR Estimated Creatinine Clearance: 28 mL/min (by C-G formula based on Cr of 1.68). Liver Function Tests:  Recent Labs Lab 03/11/15 1205 03/11/15 1327 03/12/15 0139 03/13/15 0055  AST 64* 64* 58* 48*  ALT 55 56 52 43  ALKPHOS 247* 243* 233* 207*  BILITOT 1.5* 1.5* 2.7* 1.7*  PROT 6.8 6.8 6.8 6.5  ALBUMIN 3.6 3.6 3.5 3.2*   CBC:  Recent Labs Lab 03/11/15 1133 03/12/15 0139 03/13/15 0055 03/14/15 0550  WBC 4.3 6.4 8.0 4.7  HGB 9.6* 12.2* 11.4* 10.8*  HCT 29.2* 35.9* 34.4* 32.9*  MCV 94.8 92.1 92.7 94.5  PLT 61* 50* 51* 44*   Cardiac Enzymes:  Recent Labs Lab 03/11/15 1327 03/11/15 1948 03/12/15 0139  TROPONINI <0.03 <0.03 <0.03   Microbiology Recent Results (from the past 240 hour(s))  MRSA PCR Screening     Status: None   Collection Time: 03/11/15  2:42 PM  Result Value Ref Range Status   MRSA by PCR NEGATIVE NEGATIVE Final    Comment:        The GeneXpert MRSA Assay (FDA approved for NASAL specimens only), is one component of a comprehensive MRSA colonization surveillance program. It is not intended to diagnose MRSA infection nor to guide or monitor treatment for MRSA infections.      Discharge Instructions:   Discharge Instructions    Call MD for:  extreme fatigue    Complete by:  As directed      Call MD for:  persistant dizziness or light-headedness    Complete by:  As directed      Call MD for:    Complete by:  As directed   Recurrent rectal bleeding.     Diet - low sodium heart healthy    Complete by:  As directed      Face-to-face encounter (required for Medicare/Medicaid patients)    Complete by:  As directed   I Anthony Hutchinson certify that this patient is under my care and that I, or a nurse practitioner or physician's assistant working with me, had a face-to-face encounter that meets the physician face-to-face encounter requirements with this  patient on 03/14/2015. The encounter with the patient was in whole, or in part for the following medical condition(s) which is the primary reason for home health care (List medical condition): Deconditioning post hospitalization for rectal bleeding.  The encounter with the patient was in whole, or in part, for the following medical condition, which is the primary reason for home health care:  Deconditioning post hospitalization for rectal bleeding  I certify that, based on my findings, the following services are medically necessary home health services:  Physical therapy  Reason for Medically Necessary Home Health Services:   Therapy- Home Adaptation to Facilitate Safety Therapy- Therapeutic Exercises to Increase Strength and Endurance    My clinical findings support the need for the above services:  Unable to leave home safely without assistance and/or assistive device  Further,  I certify that my clinical findings support that this patient is homebound due to:  Unable to leave home safely without assistance     Home Health    Complete by:  As directed   To provide the following care/treatments:  PT     Increase activity slowly    Complete by:  As directed      Walker     Complete by:  As directed             Medication List    TAKE these medications        acetaminophen 500 MG tablet  Commonly known as:  TYLENOL  Take 1 tablet (500 mg total) by mouth every 6 (six) hours as needed.     alfuzosin 10 MG 24 hr tablet  Commonly known as:  UROXATRAL  Take 10 mg by mouth every evening.     furosemide 20 MG tablet  Commonly known as:  LASIX  TAKE 1 TABLET DAILY     hydrocortisone-pramoxine 2.5-1 % rectal cream  Commonly known as:  ANALPRAM-HC  Place rectally 4 (four) times daily.     ketoconazole 2 % cream  Commonly known as:  NIZORAL  Apply 1 application topically daily as needed for irritation (jock itch).     lisinopril 10 MG tablet  Commonly known as:  PRINIVIL,ZESTRIL  Take  10 mg by mouth every morning.     multivitamin with minerals Tabs tablet  Take 1 tablet by mouth every morning.     SYNTHROID 125 MCG tablet  Generic drug:  levothyroxine  Take 125 mcg by mouth daily before breakfast.     TOPROL XL 25 MG 24 hr tablet  Generic drug:  metoprolol succinate  Take 25 mg by mouth daily.           Follow-up Information    Follow up with [facility], [place], MD.   Specialty:  Hematology and Oncology   Why:  At your appointment times noted below.   Contact information:   [place] [id number] [phone]       Follow up with [facility].   Why:  Home Health Physical Therapy   Contact information:   [address] [place] [address] [phone]        Time coordinating discharge: 35 minutes.  Signed:  Aubryanna Nesheim  Pager [phone] [facility] 03/14/2015, 3:25 PM

## 2015-03-14 NOTE — Care Management Note (Signed)
Case Management Note  Patient Details  Name: Anthony Hutchinson MRN: [id number] Date of Birth: 20-Dec-1921  Subjective/Objective:       anemia             Action/Plan: Home Health- NCM spoke to pt and offered choice for [facility]. Requested NCM speak to son. Agreeable to [facility] for [facility]. Requesting Rollator and 3n1 for home. Contacted [facility] DME rep for equipment for dc home today.    Expected Discharge Date:  03/14/2015              Expected Discharge Plan:  [place]  In-House Referral:     Discharge planning Services  CM Consult  Post Acute Care Choice:  Home Health Choice offered to:  Patient, Adult Children  DME Arranged:  3-N-1, Walker rolling with seat DME Agency:  [place]:  PT [place]:  [place]  Status of Service:  Completed, signed off  Medicare Important Message Given:    Date Medicare IM Given:    Medicare IM give by:    Date Additional Medicare IM Given:    Additional Medicare Important Message give by:     If discussed at [place] of Stay Meetings, dates discussed:    Additional Comments:  Erenest Rasher, RN 03/14/2015, 5:25 PM

## 2015-03-14 NOTE — Discharge Instructions (Signed)
Bloody Stools  Bloody stools often mean that there is a problem in the digestive tract. Your caregiver may use the term "melena" to describe black, tarry, and bad smelling stools or "hematochezia" to describe red or maroon-colored stools. Blood seen in the stool can be caused by bleeding anywhere along the intestinal tract.   A black stool usually means that blood is coming from the upper part of the gastrointestinal tract (esophagus, stomach, or small bowel). Passing maroon-colored stools or bright red blood usually means that blood is coming from lower down in the large bowel or the rectum. However, sometimes massive bleeding in the stomach or small intestine can cause bright red bloody stools.   Consuming black licorice, lead, iron pills, medicines containing bismuth subsalicylate, or blueberries can also cause black stools. Your caregiver can test black stools to see if blood is present.  It is important that the cause of the bleeding be found. Treatment can then be started, and the problem can be corrected. Rectal bleeding may not be serious, but you should not assume everything is okay until you know the cause. It is very important to follow up with your caregiver or a specialist in gastrointestinal problems.  CAUSES   Blood in the stools can come from various underlying causes. Often, the cause is not found during your first visit. Testing is often needed to discover the cause of bleeding in the gastrointestinal tract. Causes range from simple to serious or even life-threatening. Possible causes include:  · Hemorrhoids. These are veins that are full of blood (engorged) in the rectum. They cause pain, inflammation, and may bleed.  · Anal fissures. These are areas of painful tearing which may bleed. They are often caused by passing hard stool.  · Diverticulosis. These are pouches that form on the colon over time, with age, and may bleed significantly.  · Diverticulitis. This is inflammation in areas with  diverticulosis. It can cause pain, fever, and bloody stools, although bleeding is rare.  · Proctitis and colitis. These are inflamed areas of the rectum or colon. They may cause pain, fever, and bloody stools.  · Polyps and cancer. Colon cancer is a leading cause of preventable cancer death. It often starts out as precancerous polyps that can be removed during a colonoscopy, preventing progression into cancer. Sometimes, polyps and cancer may cause rectal bleeding.  · Gastritis and ulcers. Bleeding from the upper gastrointestinal tract (near the stomach) may travel through the intestines and produce black, sometimes tarry, often bad smelling stools. In certain cases, if the bleeding is fast enough, the stools may not be black, but red and the condition may be life-threatening.  SYMPTOMS   You may have stools that are bright red and bloody, that are normal color with blood on them, or that are dark black and tarry. In some cases, you may only have blood in the toilet bowl. Any of these cases need medical care. You may also have:  · Pain at the anus or anywhere in the rectum.  · Lightheadedness or feeling faint.  · Extreme weakness.  · Nausea or vomiting.  · Fever.  DIAGNOSIS  Your caregiver may use the following methods to find the cause of your bleeding:  · Taking a medical history. Age is important. Older people tend to develop polyps and cancer more often. If there is anal pain and a hard, large stool associated with bleeding, a tear of the anus may be the cause. If blood drips into the toilet after a bowel movement, bleeding hemorrhoids may be the   problem. The color and frequency of the bleeding are additional considerations. In most cases, the medical history provides clues, but seldom the final answer.  · A visual and finger (digital) exam. Your caregiver will inspect the anal area, looking for tears and hemorrhoids. A finger exam can provide information when there is tenderness or a growth inside. In men, the  prostate is also examined.  · Endoscopy. Several types of small, long scopes (endoscopes) are used to view the colon.  ¨ In the office, your caregiver may use a rigid, or more commonly, a flexible viewing sigmoidoscope. This exam is called flexible sigmoidoscopy. It is performed in 5 to 10 minutes.  ¨ A more thorough exam is accomplished with a colonoscope. It allows your caregiver to view the entire 5 to 6 foot long colon. Medicine to help you relax (sedative) is usually given for this exam. Frequently, a bleeding lesion may be present beyond the reach of the sigmoidoscope. So, a colonoscopy may be the best exam to start with. Both exams are usually done on an outpatient basis. This means the patient does not stay overnight in the hospital or surgery center.  ¨ An upper endoscopy may be needed to examine your stomach. Sedation is used and a flexible endoscope is put in your mouth, down to your stomach.  · A barium enema X-ray. This is an X-ray exam. It uses liquid barium inserted by enema into the rectum. This test alone may not identify an actual bleeding point. X-rays highlight abnormal shadows, such as those made by lumps (tumors), diverticuli, or colitis.  TREATMENT   Treatment depends on the cause of your bleeding.   · For bleeding from the stomach or colon, the caregiver doing your endoscopy or colonoscopy may be able to stop the bleeding as part of the procedure.  · Inflammation or infection of the colon can be treated with medicines.  · Many rectal problems can be treated with creams, suppositories, or warm baths.  · Surgery is sometimes needed.  · Blood transfusions are sometimes needed if you have lost a lot of blood.  · For any bleeding problem, let your caregiver know if you take aspirin or other blood thinners regularly.  HOME CARE INSTRUCTIONS   · Take any medicines exactly as prescribed.  · Keep your stools soft by eating a diet high in fiber. Prunes (1 to 3 a day) work well for many people.  · Drink  enough water and fluids to keep your urine clear or pale yellow.  · Take sitz baths if advised. A sitz bath is when you sit in a bathtub with warm water for 10 to 15 minutes to soak, soothe, and cleanse the rectal area.  · If enemas or suppositories are advised, be sure you know how to use them. Tell your caregiver if you have problems with this.  · Monitor your bowel movements to look for signs of improvement or worsening.  SEEK MEDICAL CARE IF:   · You do not improve in the time expected.  · Your condition worsens after initial improvement.  · You develop any new symptoms.  SEEK IMMEDIATE MEDICAL CARE IF:   · You develop severe or prolonged rectal bleeding.  · You vomit blood.  · You feel weak or faint.  · You have a fever.  MAKE SURE YOU:  · Understand these instructions.  · Will watch your condition.  · Will get help right away if you are not doing well or get worse.    Document Released: 06/16/2002 Document Revised: 09/18/2011 Document Reviewed: 11/11/2010  ExitCare® Patient Information ©2015 [employer]. This information is not intended to replace advice given to you by your health care provider. Make sure you discuss any questions you have with your health care provider.

## 2015-03-14 NOTE — Evaluation (Signed)
Physical Therapy Evaluation Patient Details Name: Anthony Hutchinson MRN: [id number] DOB: 04-30-1922 Today's Date: 03/14/2015   History of Present Illness  This is a 81 year old male with a PMH of AAA s/p stenting, stenting of the bilateral aorto-iliac arteries, radiation proctitis s/p APC 2014, MDS, complete heart block s/p pacemaker, and splenomegaly with thrombocytopenia admitted with complaints of hematochezia on 03/11/15. S/P sigmoidoscopy and  with control of bleeding.  Clinical Impression  Patient is very pleasant, ambulating with RW, does c/o pain in R 4th toe which is reddened and has a thick nail sticking up.  Patient will benefit from PT to address problems listed in note below.    Follow Up Recommendations Home health PT;Supervision - Intermittent    Equipment Recommendations  None recommended by PT    Recommendations for Other Services       Precautions / Restrictions Precautions Precautions: Fall Restrictions Weight Bearing Restrictions: No      Mobility  Bed Mobility Overal bed mobility: Independent                Transfers Overall transfer level: Needs assistance Equipment used: Rolling walker (2 wheeled);4-wheeled walker Transfers: Sit to/from Stand Sit to Stand: Min guard            Ambulation/Gait Ambulation/Gait assistance: Min guard Ambulation Distance (Feet): 350 Feet Assistive device: Rolling walker (2 wheeled);4-wheeled walker     Gait velocity interpretation: >2.62 ft/sec, indicative of independent community ambulator General Gait Details: walked 175'w/4 wheeled and 175' w 2 wheeled. gait steady, turns well.  Stairs            Wheelchair Mobility    Modified Rankin (Stroke Patients Only)       Balance Overall balance assessment: Needs assistance   Sitting balance-Leahy Scale: Normal     Standing balance support: During functional activity;Single extremity supported Standing balance-Leahy Scale: Fair                                Pertinent Vitals/Pain Pain Assessment: Faces Faces Pain Scale: Hurts whole lot Pain Location: 4 toe has a large curled nail and toe is reddeened, also reports heels burning on the bed, place Allevyn heel protectors, bandaid across R toe so sock will not catch it as it is very teander. Pain Descriptors / Indicators: Burning;Pins and needles;Sharp Pain Intervention(s): Monitored during session;Repositioned    Home Living Family/patient expects to be discharged to:: Private residence Living Arrangements: Children Available Help at Discharge: Family Type of Home: House Home Access: Stairs to enter Entrance Stairs-Rails: Right Entrance Stairs-Number of Steps: 3 Home Layout: One level Home Equipment: Environmental consultant - 2 wheels;Cane - single point Additional Comments: is trying to get paperwork completed to get a 4 wheeled RW.    Prior Function Level of Independence: Independent with assistive device(s)         Comments: states walking on unlevels is difficulty     Hand Dominance        Extremity/Trunk Assessment   Upper Extremity Assessment: Overall WFL for tasks assessed           Lower Extremity Assessment: LLE deficits/detail;RLE deficits/detail RLE Deficits / Details: strength WFL LLE Deficits / Details: strength WFL  Cervical / Trunk Assessment: Kyphotic  Communication   Communication: No difficulties  Cognition Arousal/Alertness: Awake/alert Behavior During Therapy: WFL for tasks assessed/performed Overall Cognitive Status: Within Functional Limits for tasks assessed  General Comments      Exercises        Assessment/Plan    PT Assessment Patient needs continued PT services  PT Diagnosis Difficulty walking;Generalized weakness;Acute pain   PT Problem List Decreased strength;Decreased activity tolerance;Decreased balance;Decreased mobility;Decreased knowledge of use of DME;Decreased knowledge of  precautions;Pain;Impaired sensation  PT Treatment Interventions DME instruction;Gait training;Stair training;Functional mobility training;Therapeutic activities;Therapeutic exercise;Balance training;Patient/family education   PT Goals (Current goals can be found in the Care Plan section) Acute Rehab PT Goals Patient Stated Goal: to go home PT Goal Formulation: With patient Time For Goal Achievement: 03/28/15 Potential to Achieve Goals: Good    Frequency Min 3X/week   Barriers to discharge        Co-evaluation               End of Session Equipment Utilized During Treatment: Gait belt Activity Tolerance: Patient tolerated treatment well Patient left: in chair;with call bell/phone within reach;with chair alarm set Nurse Communication: Mobility status         Time: 0916-1000 PT Time Calculation (min) (ACUTE ONLY): 44 min   Charges:   PT Evaluation $Initial PT Evaluation Tier I: 1 Procedure PT Treatments $Gait Training: 23-37 mins   PT G Codes:        Claretha Cooper 03/14/2015, 10:51 AM Tresa Endo PT [phone]

## 2015-03-25 ENCOUNTER — Other Ambulatory Visit (HOSPITAL_BASED_OUTPATIENT_CLINIC_OR_DEPARTMENT_OTHER): Payer: Medicare Other

## 2015-03-25 ENCOUNTER — Ambulatory Visit (HOSPITAL_BASED_OUTPATIENT_CLINIC_OR_DEPARTMENT_OTHER): Payer: Medicare Other

## 2015-03-25 VITALS — BP 121/50 | HR 61 | Temp 98.1°F

## 2015-03-25 DIAGNOSIS — D630 Anemia in neoplastic disease: Secondary | ICD-10-CM

## 2015-03-25 DIAGNOSIS — D631 Anemia in chronic kidney disease: Secondary | ICD-10-CM

## 2015-03-25 DIAGNOSIS — D469 Myelodysplastic syndrome, unspecified: Secondary | ICD-10-CM | POA: Diagnosis not present

## 2015-03-25 DIAGNOSIS — N183 Chronic kidney disease, stage 3 (moderate): Secondary | ICD-10-CM

## 2015-03-25 LAB — CBC WITH DIFFERENTIAL/PLATELET
BASO%: 1.5 % (ref 0.0–2.0)
Basophils Absolute: 0.1 10*3/uL (ref 0.0–0.1)
EOS%: 2.9 % (ref 0.0–7.0)
Eosinophils Absolute: 0.1 10*3/uL (ref 0.0–0.5)
HCT: 33.3 % — ABNORMAL LOW (ref 38.4–49.9)
HEMOGLOBIN: 10.6 g/dL — AB (ref 13.0–17.1)
LYMPH%: 20.5 % (ref 14.0–49.0)
MCH: 30.3 pg (ref 27.2–33.4)
MCHC: 31.8 g/dL — ABNORMAL LOW (ref 32.0–36.0)
MCV: 95.4 fL (ref 79.3–98.0)
MONO#: 0.4 10*3/uL (ref 0.1–0.9)
MONO%: 9.8 % (ref 0.0–14.0)
NEUT%: 65.3 % (ref 39.0–75.0)
NEUTROS ABS: 2.9 10*3/uL (ref 1.5–6.5)
Platelets: 78 10*3/uL — ABNORMAL LOW (ref 140–400)
RBC: 3.49 10*6/uL — AB (ref 4.20–5.82)
RDW: 24.9 % — AB (ref 11.0–14.6)
WBC: 4.4 10*3/uL (ref 4.0–10.3)
lymph#: 0.9 10*3/uL (ref 0.9–3.3)

## 2015-03-25 MED ORDER — DARBEPOETIN ALFA 500 MCG/ML IJ SOSY
500.0000 ug | PREFILLED_SYRINGE | Freq: Once | INTRAMUSCULAR | Status: AC
  Administered 2015-03-25: 500 ug via SUBCUTANEOUS
  Filled 2015-03-25: qty 1

## 2015-04-08 ENCOUNTER — Other Ambulatory Visit (HOSPITAL_BASED_OUTPATIENT_CLINIC_OR_DEPARTMENT_OTHER): Payer: Medicare Other

## 2015-04-08 ENCOUNTER — Ambulatory Visit (HOSPITAL_BASED_OUTPATIENT_CLINIC_OR_DEPARTMENT_OTHER): Payer: Medicare Other

## 2015-04-08 VITALS — BP 131/44 | HR 60 | Temp 98.4°F

## 2015-04-08 DIAGNOSIS — N183 Chronic kidney disease, stage 3 (moderate): Secondary | ICD-10-CM

## 2015-04-08 DIAGNOSIS — D631 Anemia in chronic kidney disease: Secondary | ICD-10-CM

## 2015-04-08 DIAGNOSIS — D469 Myelodysplastic syndrome, unspecified: Secondary | ICD-10-CM | POA: Diagnosis not present

## 2015-04-08 DIAGNOSIS — D630 Anemia in neoplastic disease: Secondary | ICD-10-CM

## 2015-04-08 LAB — CBC WITH DIFFERENTIAL/PLATELET
BASO%: 0.5 % (ref 0.0–2.0)
BASOS ABS: 0 10*3/uL (ref 0.0–0.1)
EOS%: 2.8 % (ref 0.0–7.0)
Eosinophils Absolute: 0.2 10*3/uL (ref 0.0–0.5)
HEMATOCRIT: 31.1 % — AB (ref 38.4–49.9)
HEMOGLOBIN: 10.2 g/dL — AB (ref 13.0–17.1)
LYMPH#: 1.3 10*3/uL (ref 0.9–3.3)
LYMPH%: 21.3 % (ref 14.0–49.0)
MCH: 30.7 pg (ref 27.2–33.4)
MCHC: 32.8 g/dL (ref 32.0–36.0)
MCV: 93.7 fL (ref 79.3–98.0)
MONO#: 0.6 10*3/uL (ref 0.1–0.9)
MONO%: 8.9 % (ref 0.0–14.0)
NEUT#: 4.1 10*3/uL (ref 1.5–6.5)
NEUT%: 66.5 % (ref 39.0–75.0)
NRBC: 0 % (ref 0–0)
Platelets: 71 10*3/uL — ABNORMAL LOW (ref 140–400)
RBC: 3.32 10*6/uL — ABNORMAL LOW (ref 4.20–5.82)
RDW: 24.4 % — AB (ref 11.0–14.6)
WBC: 6.2 10*3/uL (ref 4.0–10.3)

## 2015-04-08 MED ORDER — DARBEPOETIN ALFA 500 MCG/ML IJ SOSY
500.0000 ug | PREFILLED_SYRINGE | Freq: Once | INTRAMUSCULAR | Status: AC
  Administered 2015-04-08: 500 ug via SUBCUTANEOUS
  Filled 2015-04-08: qty 1

## 2015-04-20 ENCOUNTER — Encounter: Payer: Self-pay | Admitting: Podiatry

## 2015-04-20 ENCOUNTER — Ambulatory Visit (INDEPENDENT_AMBULATORY_CARE_PROVIDER_SITE_OTHER): Payer: Medicare Other | Admitting: Podiatry

## 2015-04-20 DIAGNOSIS — M79673 Pain in unspecified foot: Secondary | ICD-10-CM | POA: Diagnosis not present

## 2015-04-20 DIAGNOSIS — B351 Tinea unguium: Secondary | ICD-10-CM | POA: Diagnosis not present

## 2015-04-20 DIAGNOSIS — G609 Hereditary and idiopathic neuropathy, unspecified: Secondary | ICD-10-CM

## 2015-04-20 NOTE — Progress Notes (Signed)
Patient ID: Anthony Hutchinson, male   DOB: 06-06-1922, 81 y.o.   MRN: [id number]  Subjective: This patient presents today requesting debridement of toenails  Objective: Orientated 3 Toenails are elongated, brittle, discolored 6-10  Assessment: Mycotic toenails 6-10 Idiopathic peripheral neuropathy  Plan: Debridement toenails 10 mechanically and electrically without any bleeding  Reappoint 3

## 2015-04-22 ENCOUNTER — Other Ambulatory Visit (HOSPITAL_BASED_OUTPATIENT_CLINIC_OR_DEPARTMENT_OTHER): Payer: Medicare Other

## 2015-04-22 ENCOUNTER — Ambulatory Visit (HOSPITAL_BASED_OUTPATIENT_CLINIC_OR_DEPARTMENT_OTHER): Payer: Medicare Other

## 2015-04-22 VITALS — BP 121/49 | HR 76 | Temp 97.9°F

## 2015-04-22 DIAGNOSIS — D631 Anemia in chronic kidney disease: Secondary | ICD-10-CM

## 2015-04-22 DIAGNOSIS — N183 Chronic kidney disease, stage 3 (moderate): Secondary | ICD-10-CM

## 2015-04-22 DIAGNOSIS — D469 Myelodysplastic syndrome, unspecified: Secondary | ICD-10-CM | POA: Diagnosis not present

## 2015-04-22 DIAGNOSIS — D630 Anemia in neoplastic disease: Secondary | ICD-10-CM

## 2015-04-22 LAB — CBC WITH DIFFERENTIAL/PLATELET
BASO%: 0.7 % (ref 0.0–2.0)
BASOS ABS: 0 10*3/uL (ref 0.0–0.1)
EOS%: 2.5 % (ref 0.0–7.0)
Eosinophils Absolute: 0.1 10*3/uL (ref 0.0–0.5)
HCT: 28.7 % — ABNORMAL LOW (ref 38.4–49.9)
HEMOGLOBIN: 9.3 g/dL — AB (ref 13.0–17.1)
LYMPH#: 1 10*3/uL (ref 0.9–3.3)
LYMPH%: 21.5 % (ref 14.0–49.0)
MCH: 30.9 pg (ref 27.2–33.4)
MCHC: 32.4 g/dL (ref 32.0–36.0)
MCV: 95.3 fL (ref 79.3–98.0)
MONO#: 0.4 10*3/uL (ref 0.1–0.9)
MONO%: 9.4 % (ref 0.0–14.0)
NEUT#: 3 10*3/uL (ref 1.5–6.5)
NEUT%: 65.9 % (ref 39.0–75.0)
NRBC: 0 % (ref 0–0)
PLATELETS: 56 10*3/uL — AB (ref 140–400)
RBC: 3.01 10*6/uL — AB (ref 4.20–5.82)
RDW: 25.7 % — AB (ref 11.0–14.6)
WBC: 4.5 10*3/uL (ref 4.0–10.3)

## 2015-04-22 LAB — TECHNOLOGIST REVIEW

## 2015-04-22 MED ORDER — DARBEPOETIN ALFA 500 MCG/ML IJ SOSY
500.0000 ug | PREFILLED_SYRINGE | Freq: Once | INTRAMUSCULAR | Status: AC
  Administered 2015-04-22: 500 ug via SUBCUTANEOUS
  Filled 2015-04-22: qty 1

## 2015-04-25 ENCOUNTER — Encounter (HOSPITAL_COMMUNITY): Payer: Self-pay | Admitting: Emergency Medicine

## 2015-04-25 ENCOUNTER — Emergency Department (HOSPITAL_COMMUNITY)
Admission: EM | Admit: 2015-04-25 | Discharge: 2015-04-25 | Disposition: A | Discharge status: Home / Self Care | Payer: Medicare Other | Attending: Emergency Medicine | Admitting: Emergency Medicine

## 2015-04-25 DIAGNOSIS — Z8719 Personal history of other diseases of the digestive system: Secondary | ICD-10-CM | POA: Insufficient documentation

## 2015-04-25 DIAGNOSIS — Z87891 Personal history of nicotine dependence: Secondary | ICD-10-CM | POA: Insufficient documentation

## 2015-04-25 DIAGNOSIS — Z950 Presence of cardiac pacemaker: Secondary | ICD-10-CM | POA: Diagnosis not present

## 2015-04-25 DIAGNOSIS — I10 Essential (primary) hypertension: Secondary | ICD-10-CM | POA: Insufficient documentation

## 2015-04-25 DIAGNOSIS — E119 Type 2 diabetes mellitus without complications: Secondary | ICD-10-CM | POA: Insufficient documentation

## 2015-04-25 DIAGNOSIS — D649 Anemia, unspecified: Secondary | ICD-10-CM

## 2015-04-25 DIAGNOSIS — D696 Thrombocytopenia, unspecified: Secondary | ICD-10-CM | POA: Diagnosis not present

## 2015-04-25 DIAGNOSIS — Z7952 Long term (current) use of systemic steroids: Secondary | ICD-10-CM | POA: Insufficient documentation

## 2015-04-25 DIAGNOSIS — K625 Hemorrhage of anus and rectum: Secondary | ICD-10-CM | POA: Diagnosis present

## 2015-04-25 DIAGNOSIS — E039 Hypothyroidism, unspecified: Secondary | ICD-10-CM | POA: Insufficient documentation

## 2015-04-25 DIAGNOSIS — K644 Residual hemorrhoidal skin tags: Secondary | ICD-10-CM | POA: Diagnosis not present

## 2015-04-25 DIAGNOSIS — H269 Unspecified cataract: Secondary | ICD-10-CM | POA: Insufficient documentation

## 2015-04-25 DIAGNOSIS — D469 Myelodysplastic syndrome, unspecified: Secondary | ICD-10-CM

## 2015-04-25 DIAGNOSIS — Z859 Personal history of malignant neoplasm, unspecified: Secondary | ICD-10-CM | POA: Diagnosis not present

## 2015-04-25 DIAGNOSIS — Z880 Allergy status to penicillin: Secondary | ICD-10-CM | POA: Diagnosis not present

## 2015-04-25 LAB — COMPREHENSIVE METABOLIC PANEL
ALT: 42 U/L (ref 17–63)
ANION GAP: 5 (ref 5–15)
AST: 48 U/L — ABNORMAL HIGH (ref 15–41)
Albumin: 3.6 g/dL (ref 3.5–5.0)
Alkaline Phosphatase: 177 U/L — ABNORMAL HIGH (ref 38–126)
BUN: 31 mg/dL — ABNORMAL HIGH (ref 6–20)
CHLORIDE: 107 mmol/L (ref 101–111)
CO2: 25 mmol/L (ref 22–32)
Calcium: 8.8 mg/dL — ABNORMAL LOW (ref 8.9–10.3)
Creatinine, Ser: 1.54 mg/dL — ABNORMAL HIGH (ref 0.61–1.24)
GFR, EST AFRICAN AMERICAN: 44 mL/min — AB (ref >60)
GFR, EST NON AFRICAN AMERICAN: 38 mL/min — AB (ref >60)
Glucose, Bld: 162 mg/dL — ABNORMAL HIGH (ref 65–99)
POTASSIUM: 4.9 mmol/L (ref 3.5–5.1)
Sodium: 137 mmol/L (ref 135–145)
Total Bilirubin: 1.7 mg/dL — ABNORMAL HIGH (ref 0.3–1.2)
Total Protein: 7.1 g/dL (ref 6.5–8.1)

## 2015-04-25 LAB — CBC
HEMATOCRIT: 28.6 % — AB (ref 39.0–52.0)
Hemoglobin: 9.3 g/dL — ABNORMAL LOW (ref 13.0–17.0)
MCH: 31.4 pg (ref 26.0–34.0)
MCHC: 32.5 g/dL (ref 30.0–36.0)
MCV: 96.6 fL (ref 78.0–100.0)
Platelets: 46 10*3/uL — ABNORMAL LOW (ref 150–400)
RBC: 2.96 MIL/uL — AB (ref 4.22–5.81)
RDW: 25.1 % — ABNORMAL HIGH (ref 11.5–15.5)
WBC: 4.5 10*3/uL (ref 4.0–10.5)

## 2015-04-25 LAB — TYPE AND SCREEN
ABO/RH(D): A POS
ANTIBODY SCREEN: NEGATIVE

## 2015-04-25 NOTE — Discharge Instructions (Signed)
It was our pleasure to provide your ER care today - we hope that you feel better.  From today's lab tests, your blood count/hemoglobin remains stable as compared to prior value.  Also from the lab work, your platelet count remains low  - follow up with your hematologist.  For hemorrhoids/hemorrhoid bleeding, follow up with GI doctor, and/or surgeon in the coming week - see referrals - call office tomorrow morning to arrange appointment.  Use your hemorrhoid cream as need for symptom relief.  Return to ER if worse, new symptoms, fevers, fainting, severe abdominal pain, other concern.   Hemorrhoids Hemorrhoids are swollen veins around the rectum or anus. There are two types of hemorrhoids:   Internal hemorrhoids. These occur in the veins just inside the rectum. They may poke through to the outside and become irritated and painful.  External hemorrhoids. These occur in the veins outside the anus and can be felt as a painful swelling or hard lump near the anus. CAUSES  Pregnancy.   Obesity.   Constipation or diarrhea.   Straining to have a bowel movement.   Sitting for long periods on the toilet.  Heavy lifting or other activity that caused you to strain.  Anal intercourse. SYMPTOMS   Pain.   Anal itching or irritation.   Rectal bleeding.   Fecal leakage.   Anal swelling.   One or more lumps around the anus.  DIAGNOSIS  Your caregiver may be able to diagnose hemorrhoids by visual examination. Other examinations or tests that may be performed include:   Examination of the rectal area with a gloved hand (digital rectal exam).   Examination of anal canal using a small tube (scope).   A blood test if you have lost a significant amount of blood.  A test to look inside the colon (sigmoidoscopy or colonoscopy). TREATMENT Most hemorrhoids can be treated at home. However, if symptoms do not seem to be getting better or if you have a lot of rectal bleeding,  your caregiver may perform a procedure to help make the hemorrhoids get smaller or remove them completely. Possible treatments include:   Placing a rubber band at the base of the hemorrhoid to cut off the circulation (rubber band ligation).   Injecting a chemical to shrink the hemorrhoid (sclerotherapy).   Using a tool to burn the hemorrhoid (infrared light therapy).   Surgically removing the hemorrhoid (hemorrhoidectomy).   Stapling the hemorrhoid to block blood flow to the tissue (hemorrhoid stapling).  HOME CARE INSTRUCTIONS   Eat foods with fiber, such as whole grains, beans, nuts, fruits, and vegetables. Ask your doctor about taking products with added fiber in them (fibersupplements).  Increase fluid intake. Drink enough water and fluids to keep your urine clear or pale yellow.   Exercise regularly.   Go to the bathroom when you have the urge to have a bowel movement. Do not wait.   Avoid straining to have bowel movements.   Keep the anal area dry and clean. Use wet toilet paper or moist towelettes after a bowel movement.   Medicated creams and suppositories may be used or applied as directed.   Only take over-the-counter or prescription medicines as directed by your caregiver.   Take warm sitz baths for 15-20 minutes, 3-4 times a day to ease pain and discomfort.   Place ice packs on the hemorrhoids if they are tender and swollen. Using ice packs between sitz baths may be helpful.   Put ice in a plastic bag.  Place a towel between your skin and the bag.   Leave the ice on for 15-20 minutes, 3-4 times a day.   Do not use a donut-shaped pillow or sit on the toilet for long periods. This increases blood pooling and pain.  SEEK MEDICAL CARE IF:  You have increasing pain and swelling that is not controlled by treatment or medicine.  You have uncontrolled bleeding.  You have difficulty or you are unable to have a bowel movement.  You have pain or  inflammation outside the area of the hemorrhoids. MAKE SURE YOU:  Understand these instructions.  Will watch your condition.  Will get help right away if you are not doing well or get worse.   This information is not intended to replace advice given to you by your health care provider. Make sure you discuss any questions you have with your health care provider.   Document Released: 06/23/2000 Document Revised: 06/12/2012 Document Reviewed: 04/30/2012 Elsevier Interactive Patient Education 2016 [employer].   Thrombocytopenia Thrombocytopenia is a condition in which there is an abnormally small number of platelets in your blood. Platelets are also called thrombocytes. Platelets are needed for blood clotting. CAUSES Thrombocytopenia is caused by:   Decreased production of platelets. This can be caused by:  Aplastic anemia in which your bone marrow quits making blood cells.  Cancer in the bone marrow.  Use of certain medicines, including chemotherapy.  Infection in the bone marrow.  Heavy alcohol consumption.  Increased destruction of platelets. This can be caused by:  Certain immune diseases.  Use of certain drugs.  Certain blood clotting disorders.  Certain inherited disorders.  Certain bleeding disorders.  Pregnancy.  Having an enlarged spleen (hypersplenism). In hypersplenism, the spleen gathers up platelets from circulation. This means the platelets are not available to help with blood clotting. The spleen can enlarge due to cirrhosis or other conditions. SYMPTOMS  The symptoms of thrombocytopenia are side effects of poor blood clotting. Some of these are:  Abnormal bleeding.  Nosebleeds.  Heavy menstrual periods.  Blood in the urine or stools.  Purpura. This is a purplish discoloration in the skin produced by small bleeding vessels near the surface of the skin.  Bruising.  A rash that may be petechial. This looks like pinpoint, purplish-red spots on  the skin and mucous membranes. It is caused by bleeding from small blood vessels (capillaries). DIAGNOSIS  Your caregiver will make this diagnosis based on your exam and blood tests. Sometimes, a bone marrow study is done to look for the original cells (megakaryocytes) that make platelets. TREATMENT  Treatment depends on the cause of the condition.  Medicines may be given to help protect your platelets from being destroyed.  In some cases, a replacement (transfusion) of platelets may be required to stop or prevent bleeding.  Sometimes, the spleen must be surgically removed. HOME CARE INSTRUCTIONS   Check the skin and linings inside your mouth for bruising or bleeding as directed by your caregiver.  Check your sputum, urine, and stool for blood as directed by your caregiver.  Do not return to any activities that could cause bumps or bruises until your caregiver says it is okay.  Take extra care not to cut yourself when shaving or when using scissors, needles, knives, and other tools.  Take extra care not to burn yourself when ironing or cooking.  Ask your caregiver if it is okay for you to drink alcohol.  Only take over-the-counter or prescription medicines as directed by  your caregiver.  Notify all your caregivers, including dentists and eye doctors, about your condition. SEEK IMMEDIATE MEDICAL CARE IF:   You develop active bleeding from anywhere in your body.  You develop unexplained bruising or bleeding.  You have blood in your sputum, urine, or stool. MAKE SURE YOU:  Understand these instructions.  Will watch your condition.  Will get help right away if you are not doing well or get worse.   This information is not intended to replace advice given to you by your health care provider. Make sure you discuss any questions you have with your health care provider.   Document Released: 06/26/2005 Document Revised: 09/18/2011 Document Reviewed: 12/28/2014 Elsevier  Interactive Patient Education [employer].

## 2015-04-25 NOTE — ED Notes (Signed)
Pt states he's had problems in the past with rectal bleeding. "The doctor has done laser treatment on it 2 times and I'm still having problems with it. When I got up in a puddle of blood today, I thought it may be time to come on over here." Pt states he believes it's coming from an external hemorrhoid. No complaints of pain, states he's not feeling weak, vitals stable, no obvious uncontrolled bleeding in triage.

## 2015-04-25 NOTE — ED Provider Notes (Addendum)
CSN: [id number]     Arrival date & time 04/25/15  1617 History   First MD Initiated Contact with Patient 04/25/15 1738     Chief Complaint  Patient presents with  . Rectal Bleeding  . Hemorrhoids     (Consider location/radiation/quality/duration/timing/severity/associated sxs/prior Treatment) Patient is a 81 y.o. male presenting with hematochezia. The history is provided by the patient.  Rectal Bleeding Associated symptoms: no abdominal pain, no epistaxis, no fever, no light-headedness and no vomiting   Patient with hx MDS, ext hemorrhoids, presents w rectal bleeding, mild hemorrhoid area pain. Had recently been evaluated in hospital for same. Bleeding had resolved. Recurred today. Denies straining or hard stools. No abd pain. No nv. Stools have been brown/normal color. Today had brown stool, and then noted mild amount bright red blood.  No anticoag use. No other abn bruising or bleeding. Does not feel sick or ill. No faintness or lightheadedness.     Past Medical History  Diagnosis Date  . Hypertension   . LBBB (left bundle branch block)   . Diabetes mellitus without complication ([place])   . Cancer ([place])   . Hypothyroidism     nodule  . Cataracts, bilateral   . Anemia     iron deficiency anemia  . Inguinal hernia   . Peripheral vascular disease ([place])     Peripheral neuropathy  . Complete heart block ([place])   . MDS (myelodysplastic syndrome) ([place]) 01/05/2014   Past Surgical History  Procedure Laterality Date  . Tonsillectomy    . Hiatal hernia repair    . Hernia repair  1979    bilateral inguinal hernia  . Thyroid lobectomy  6/85    right  . Acromionectomy  1998    rotator cuff repair  . Prostate surgery    . Abdominal aortic aneurysm repair    . Tonsillectomy    . Flexible sigmoidoscopy N/A 12/13/2012    Procedure: FLEXIBLE SIGMOIDOSCOPY;  Surgeon: Beryle Beams, MD;  Location: [facility] ENDOSCOPY;  Service: Endoscopy;  Laterality: N/A;  . Hot hemostasis N/A 12/13/2012     Procedure: HOT HEMOSTASIS (ARGON PLASMA COAGULATION/BICAP);  Surgeon: Beryle Beams, MD;  Location: [facility];  Service: Endoscopy;  Laterality: N/A;  . Pacemaker insertion  11-03-13    STJ dual chamber pacemaker implanted by Dr Lovena Le for CHB  . Permanent pacemaker insertion N/A 11/03/2013    Procedure: PERMANENT PACEMAKER INSERTION;  Surgeon: Evans Lance, MD;  Location: [facility] CATH LAB;  Service: Cardiovascular;  Laterality: N/A;  . Flexible sigmoidoscopy N/A 03/12/2015    Procedure: FLEXIBLE SIGMOIDOSCOPY;  Surgeon: Carol Ada, MD;  Location: [facility] ENDOSCOPY;  Service: Endoscopy;  Laterality: N/A;  will need APC   . Hot hemostasis N/A 03/12/2015    Procedure: HOT HEMOSTASIS (ARGON PLASMA COAGULATION/BICAP);  Surgeon: Carol Ada, MD;  Location: [facility];  Service: Endoscopy;  Laterality: N/A;   Family History  Problem Relation Age of Onset  . Heart disease Mother   . Heart disease Father   . Heart disease Son   . Hyperlipidemia Son    Social History  Substance Use Topics  . Smoking status: Former Smoker    Types: Pipe    Quit date: 11/27/1963  . Smokeless tobacco: Never Used  . Alcohol Use: No    Review of Systems  Constitutional: Negative for fever.  HENT: Negative for nosebleeds.   Respiratory: Negative for shortness of breath.   Cardiovascular: Negative for chest pain.  Gastrointestinal: Positive for blood in stool and  hematochezia. Negative for vomiting and abdominal pain.  Genitourinary: Negative for hematuria.  Musculoskeletal: Negative for back pain and neck pain.  Skin: Negative for rash.  Neurological: Negative for syncope and light-headedness.  Hematological: Does not bruise/bleed easily.  Psychiatric/Behavioral: Negative for confusion.      Allergies  Codeine sulfate and Penicillins  Home Medications   Prior to Admission medications   Medication Sig Start Date End Date Taking? Authorizing Provider  acetaminophen (TYLENOL) 500 MG tablet Take 1 tablet  (500 mg total) by mouth every 6 (six) hours as needed. Patient taking differently: Take 500 mg by mouth every 6 (six) hours as needed for mild pain, moderate pain or headache.  12/19/14   Merryl Hacker, MD  alfuzosin (UROXATRAL) 10 MG 24 hr tablet Take 10 mg by mouth every evening.     Historical Provider, MD  diclofenac (CATAFLAM) 50 MG tablet Take 50 mg by mouth 2 (two) times daily. for 10 days 02/18/15   Historical Provider, MD  furosemide (LASIX) 20 MG tablet TAKE 1 TABLET DAILY Patient taking differently: TAKE 20 MG BY MOUTH DAILY 02/09/15   Evans Lance, MD  hydrocortisone-pramoxine [facility]) 2.5-1 % rectal cream Place rectally 4 (four) times daily. 03/14/15   Venetia Maxon Rama, MD  ketoconazole (NIZORAL) 2 % cream Apply 1 application topically daily as needed for irritation (jock itch).  09/07/14   Historical Provider, MD  lisinopril (PRINIVIL,ZESTRIL) 10 MG tablet Take 10 mg by mouth every morning.    Historical Provider, MD  Multiple Vitamin (MULTIVITAMIN WITH MINERALS) TABS Take 1 tablet by mouth every morning.    Historical Provider, MD  SYNTHROID 125 MCG tablet Take 125 mcg by mouth daily before breakfast.  09/15/14   Historical Provider, MD  TOPROL XL 25 MG 24 hr tablet Take 25 mg by mouth daily. 12/24/13   Historical Provider, MD   BP 148/57 mmHg  Pulse 75  Temp(Src) 98.2 F (36.8 C) (Oral)  Resp 18  SpO2 98% Physical Exam  Constitutional: He is oriented to person, place, and time. He appears well-developed and well-nourished. No distress.  HENT:  Mouth/Throat: Oropharynx is clear and moist.  Eyes: Conjunctivae are normal.  Neck: Neck supple. No tracheal deviation present.  Cardiovascular: Normal rate.   Pulmonary/Chest: Effort normal. No accessory muscle usage. No respiratory distress.  Abdominal: He exhibits no distension. There is no tenderness.  Genitourinary:  Inflamed external hemorrhoids, minimal ooze blood.  Stool above hemorrhoids soft, light brown in color.    Musculoskeletal: Normal range of motion.  Neurological: He is alert and oriented to person, place, and time.  Skin: Skin is warm and dry. He is not diaphoretic.  Psychiatric: He has a normal mood and affect.  Nursing note and vitals reviewed.   ED Course  Procedures (including critical care time) Labs Review  Results for orders placed or performed during the hospital encounter of 04/25/15  Comprehensive metabolic panel  Result Value Ref Range   Sodium 137 135 - 145 mmol/L   Potassium 4.9 3.5 - 5.1 mmol/L   Chloride 107 101 - 111 mmol/L   CO2 25 22 - 32 mmol/L   Glucose, Bld 162 (H) 65 - 99 mg/dL   BUN 31 (H) 6 - 20 mg/dL   Creatinine, Ser 1.54 (H) 0.61 - 1.24 mg/dL   Calcium 8.8 (L) 8.9 - 10.3 mg/dL   Total Protein 7.1 6.5 - 8.1 g/dL   Albumin 3.6 3.5 - 5.0 g/dL   AST 48 (H) 15 -  41 U/L   ALT 42 17 - 63 U/L   Alkaline Phosphatase 177 (H) 38 - 126 U/L   Total Bilirubin 1.7 (H) 0.3 - 1.2 mg/dL   GFR calc non Af Amer 38 (L) >60 mL/min   GFR calc Af Amer 44 (L) >60 mL/min   Anion gap 5 5 - 15  CBC  Result Value Ref Range   WBC 4.5 4.0 - 10.5 K/uL   RBC 2.96 (L) 4.22 - 5.81 MIL/uL   Hemoglobin 9.3 (L) 13.0 - 17.0 g/dL   HCT 28.6 (L) 39.0 - 52.0 %   MCV 96.6 78.0 - 100.0 fL   MCH 31.4 26.0 - 34.0 pg   MCHC 32.5 30.0 - 36.0 g/dL   RDW 25.1 (H) 11.5 - 15.5 %   Platelets 46 (L) 150 - 400 K/uL  Type and screen [place]  Result Value Ref Range   ABO/RH(D) A POS    Antibody Screen NEG    Sample Expiration 04/28/2015       I have personally reviewed and evaluated these lab results as part of my medical decision-making.    MDM   Labs.  Reviewed nursing notes and prior charts for additional history.   Recent sigmoidoscopy - at that time, bleeding from ext hem.  hgb unchanged from prior. plt count low, also similar to prior, hx MDS.  Vitals normal, no faintness or dizziness. hgb stable from prior.  Pt currently appears stable for d/c.  rec  close gi f/u, and if recurrent hemorrhoids problems continue, gen surg.   Return precautions provided.  Pt/fam request referral to new gi doctor.  Recheck, no active/recurrent bleeding.       Lajean Saver, MD 04/25/15 [phone]

## 2015-04-26 IMAGING — CR DG CHEST 1V PORT
1 series · 1 of 1 positions shown · non-contrast
Comparison: None.

CLINICAL DATA: Bradycardia

EXAM:
PORTABLE CHEST - 1 VIEW

[AP]
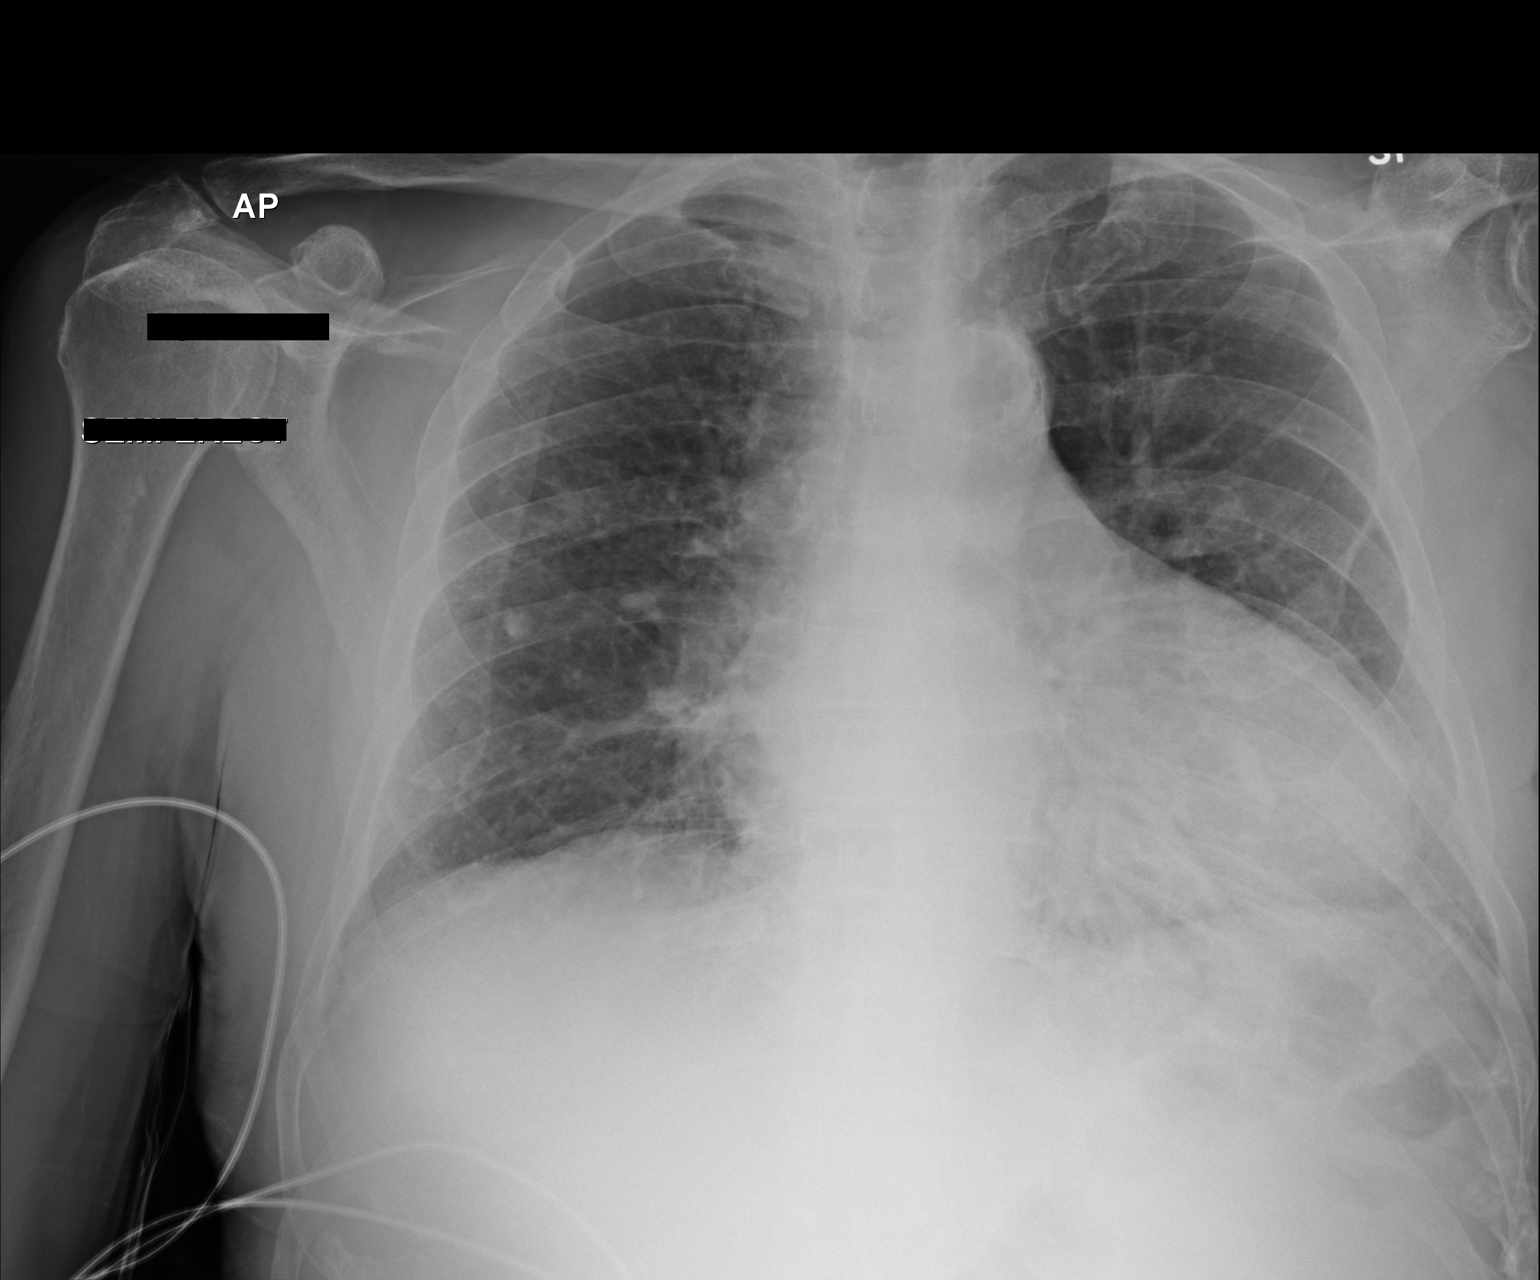

[1 of 1 positions shown; findings below may reference images not displayed]

FINDINGS: Cardiomegaly with left ventricular and left atrial dilatation.
Atherosclerotic calcifications noted in the transverse aorta. Mild
pulmonary vascular congestion without overt edema. Small focal
patchy opacity in the right mid lung. Well-defined dense for size 6
mm nodule in the right mid lung. This may represent a calcified
granuloma although no comparison imaging is available for
confirmation. Somewhat linear left basilar retrocardiac opacity
favored to reflect atelectasis. No pneumothorax. No acute osseous
abnormality.
IMPRESSION: 1. Cardiomegaly with left ventricular and left atrial enlargement.
2. Aortic atherosclerosis.
3. Low inspiratory volumes with vascular congestion but no overt
edema.
4. Left retrocardiac opacity favored to reflect atelectasis.
Superimposed infiltrate difficult to exclude entirely.
5. Nonspecific patchy opacity in the right mid lung may reflect a
focus of scarring, atelectasis or early infiltrate.
6. Dense for size 6 mm right mid lung pulmonary nodule favored to
reflect a benign calcified granuloma.

## 2015-05-06 ENCOUNTER — Other Ambulatory Visit (HOSPITAL_BASED_OUTPATIENT_CLINIC_OR_DEPARTMENT_OTHER): Payer: Medicare Other

## 2015-05-06 ENCOUNTER — Emergency Department (HOSPITAL_COMMUNITY)
Admission: EM | Admit: 2015-05-06 | Discharge: 2015-05-06 | Disposition: A | Discharge status: Home / Self Care | Payer: Medicare Other | Attending: Emergency Medicine | Admitting: Emergency Medicine

## 2015-05-06 ENCOUNTER — Other Ambulatory Visit: Payer: Self-pay | Admitting: Hematology and Oncology

## 2015-05-06 ENCOUNTER — Encounter (HOSPITAL_COMMUNITY): Payer: Self-pay | Admitting: *Deleted

## 2015-05-06 ENCOUNTER — Ambulatory Visit (HOSPITAL_BASED_OUTPATIENT_CLINIC_OR_DEPARTMENT_OTHER): Payer: Medicare Other

## 2015-05-06 VITALS — BP 123/48 | HR 60 | Temp 97.9°F

## 2015-05-06 DIAGNOSIS — D469 Myelodysplastic syndrome, unspecified: Secondary | ICD-10-CM

## 2015-05-06 DIAGNOSIS — E039 Hypothyroidism, unspecified: Secondary | ICD-10-CM | POA: Insufficient documentation

## 2015-05-06 DIAGNOSIS — K649 Unspecified hemorrhoids: Secondary | ICD-10-CM

## 2015-05-06 DIAGNOSIS — K625 Hemorrhage of anus and rectum: Secondary | ICD-10-CM | POA: Diagnosis present

## 2015-05-06 DIAGNOSIS — D630 Anemia in neoplastic disease: Secondary | ICD-10-CM

## 2015-05-06 DIAGNOSIS — Z87891 Personal history of nicotine dependence: Secondary | ICD-10-CM | POA: Diagnosis not present

## 2015-05-06 DIAGNOSIS — Z859 Personal history of malignant neoplasm, unspecified: Secondary | ICD-10-CM | POA: Diagnosis not present

## 2015-05-06 DIAGNOSIS — D631 Anemia in chronic kidney disease: Secondary | ICD-10-CM

## 2015-05-06 DIAGNOSIS — N183 Chronic kidney disease, stage 3 (moderate): Secondary | ICD-10-CM

## 2015-05-06 DIAGNOSIS — Z79899 Other long term (current) drug therapy: Secondary | ICD-10-CM | POA: Diagnosis not present

## 2015-05-06 DIAGNOSIS — Z7952 Long term (current) use of systemic steroids: Secondary | ICD-10-CM | POA: Insufficient documentation

## 2015-05-06 DIAGNOSIS — H269 Unspecified cataract: Secondary | ICD-10-CM | POA: Diagnosis not present

## 2015-05-06 DIAGNOSIS — Z950 Presence of cardiac pacemaker: Secondary | ICD-10-CM | POA: Insufficient documentation

## 2015-05-06 DIAGNOSIS — Z880 Allergy status to penicillin: Secondary | ICD-10-CM | POA: Insufficient documentation

## 2015-05-06 DIAGNOSIS — I10 Essential (primary) hypertension: Secondary | ICD-10-CM | POA: Insufficient documentation

## 2015-05-06 DIAGNOSIS — E119 Type 2 diabetes mellitus without complications: Secondary | ICD-10-CM | POA: Diagnosis not present

## 2015-05-06 DIAGNOSIS — I252 Old myocardial infarction: Secondary | ICD-10-CM | POA: Insufficient documentation

## 2015-05-06 DIAGNOSIS — Z862 Personal history of diseases of the blood and blood-forming organs and certain disorders involving the immune mechanism: Secondary | ICD-10-CM | POA: Diagnosis not present

## 2015-05-06 LAB — CBC
HCT: 27.3 % — ABNORMAL LOW (ref 39.0–52.0)
HEMOGLOBIN: 8.9 g/dL — AB (ref 13.0–17.0)
MCH: 31.3 pg (ref 26.0–34.0)
MCHC: 32.6 g/dL (ref 30.0–36.0)
MCV: 96.1 fL (ref 78.0–100.0)
PLATELETS: 67 10*3/uL — AB (ref 150–400)
RBC: 2.84 MIL/uL — AB (ref 4.22–5.81)
RDW: 25.6 % — ABNORMAL HIGH (ref 11.5–15.5)
WBC: 5.3 10*3/uL (ref 4.0–10.5)

## 2015-05-06 LAB — CBC WITH DIFFERENTIAL/PLATELET
BASO%: 0.4 % (ref 0.0–2.0)
BASOS ABS: 0 10*3/uL (ref 0.0–0.1)
EOS ABS: 0.1 10*3/uL (ref 0.0–0.5)
EOS%: 2.8 % (ref 0.0–7.0)
HCT: 27.6 % — ABNORMAL LOW (ref 38.4–49.9)
HEMOGLOBIN: 9 g/dL — AB (ref 13.0–17.1)
LYMPH#: 1 10*3/uL (ref 0.9–3.3)
LYMPH%: 19.2 % (ref 14.0–49.0)
MCH: 31.4 pg (ref 27.2–33.4)
MCHC: 32.6 g/dL (ref 32.0–36.0)
MCV: 96.2 fL (ref 79.3–98.0)
MONO#: 0.5 10*3/uL (ref 0.1–0.9)
MONO%: 9.3 % (ref 0.0–14.0)
NEUT#: 3.4 10*3/uL (ref 1.5–6.5)
NEUT%: 68.3 % (ref 39.0–75.0)
PLATELETS: 65 10*3/uL — AB (ref 140–400)
RBC: 2.87 10*6/uL — ABNORMAL LOW (ref 4.20–5.82)
RDW: 25.4 % — ABNORMAL HIGH (ref 11.0–14.6)
WBC: 4.9 10*3/uL (ref 4.0–10.3)
nRBC: 1 % — ABNORMAL HIGH (ref 0–0)

## 2015-05-06 LAB — COMPREHENSIVE METABOLIC PANEL
ALK PHOS: 224 U/L — AB (ref 38–126)
ALT: 53 U/L (ref 17–63)
ANION GAP: 7 (ref 5–15)
AST: 78 U/L — ABNORMAL HIGH (ref 15–41)
Albumin: 3.9 g/dL (ref 3.5–5.0)
BILIRUBIN TOTAL: 1.3 mg/dL — AB (ref 0.3–1.2)
BUN: 32 mg/dL — ABNORMAL HIGH (ref 6–20)
CALCIUM: 9.1 mg/dL (ref 8.9–10.3)
CO2: 25 mmol/L (ref 22–32)
Chloride: 107 mmol/L (ref 101–111)
Creatinine, Ser: 1.56 mg/dL — ABNORMAL HIGH (ref 0.61–1.24)
GFR, EST AFRICAN AMERICAN: 43 mL/min — AB (ref >60)
GFR, EST NON AFRICAN AMERICAN: 37 mL/min — AB (ref >60)
Glucose, Bld: 154 mg/dL — ABNORMAL HIGH (ref 65–99)
Potassium: 4.5 mmol/L (ref 3.5–5.1)
SODIUM: 139 mmol/L (ref 135–145)
TOTAL PROTEIN: 7.2 g/dL (ref 6.5–8.1)

## 2015-05-06 LAB — I-STAT CHEM 8, ED
BUN: 39 mg/dL — ABNORMAL HIGH (ref 6–20)
CALCIUM ION: 1.14 mmol/L (ref 1.13–1.30)
Chloride: 104 mmol/L (ref 101–111)
Creatinine, Ser: 1.6 mg/dL — ABNORMAL HIGH (ref 0.61–1.24)
Glucose, Bld: 152 mg/dL — ABNORMAL HIGH (ref 65–99)
HEMATOCRIT: 32 % — AB (ref 39.0–52.0)
HEMOGLOBIN: 10.9 g/dL — AB (ref 13.0–17.0)
Potassium: 4.5 mmol/L (ref 3.5–5.1)
SODIUM: 138 mmol/L (ref 135–145)
TCO2: 24 mmol/L (ref 0–100)

## 2015-05-06 LAB — TYPE AND SCREEN
ABO/RH(D): A POS
ANTIBODY SCREEN: NEGATIVE

## 2015-05-06 MED ORDER — DARBEPOETIN ALFA 500 MCG/ML IJ SOSY
500.0000 ug | PREFILLED_SYRINGE | Freq: Once | INTRAMUSCULAR | Status: AC
  Administered 2015-05-06: 500 ug via SUBCUTANEOUS
  Filled 2015-05-06: qty 1

## 2015-05-06 NOTE — ED Notes (Signed)
Bed: WA25 Expected date:  Expected time:  Means of arrival:  Comments: Triage 2 

## 2015-05-06 NOTE — Progress Notes (Signed)
Anthony Hutchinson is here for Aranesp injection.  Hgb has dropped to 9.0.  He states that he has been having bleeding from his hemorrhoids every morning.  He will get up and go to the bathroom.  He has blood on the floor,  And he puts a pad on then it stops.  According to him he has been to PCP and GI physician.  They have cauterized them but it hasn't helped.  Son and patient are concerned and feel the doctors are not concerned about it.  I told them I would tell Dr Alvy Bimler about the problem.

## 2015-05-06 NOTE — ED Notes (Addendum)
Pt has hx of hemorrhoid rectal bleeding, myelodysplastic syndrome, anemia. Had blood work drawn this morning at the cancer center,  HGB was 9, since then pt went home, and bled through underwear, pants, and left a large spot on seat cushion. Pt denies pain, reports he never has pain and cannot tell when he is bleeding. Pt appears pale.

## 2015-05-06 NOTE — ED Notes (Signed)
md in triage room talking with pt. Pt will be moved to room when one comes available.   Pt alert and oriented x4. Respirations even and unlabored, bilateral symmetrical rise and fall of chest. Skin warm and dry. In no acute distress. Denies needs.

## 2015-05-06 NOTE — ED Provider Notes (Signed)
CSN: [id number]     Arrival date & time 05/06/15  1748 History   First MD Initiated Contact with Patient 05/06/15 1827     Chief Complaint  Patient presents with  . Rectal Bleeding     (Consider location/radiation/quality/duration/timing/severity/associated sxs/prior Treatment) Patient is a 81 y.o. male presenting with hematochezia.  Rectal Bleeding Quality:  Bright red Amount:  Moderate Duration:  3 weeks Timing:  Intermittent Progression:  Unable to specify Chronicity:  New Context: hemorrhoids   Context: not anal fissures, not constipation, not diarrhea, not rectal pain and not spontaneously   Similar prior episodes: yes   Relieved by:  None tried Worsened by:  Nothing tried Ineffective treatments:  None tried Associated symptoms: no abdominal pain, no dizziness, no fever, no hematemesis, no light-headedness, no loss of consciousness, no recent illness and no vomiting     Past Medical History  Diagnosis Date  . Hypertension   . LBBB (left bundle branch block)   . Diabetes mellitus without complication ([place])   . Cancer ([place])   . Hypothyroidism     nodule  . Cataracts, bilateral   . Anemia     iron deficiency anemia  . Inguinal hernia   . Peripheral vascular disease ([place])     Peripheral neuropathy  . Complete heart block ([place])   . MDS (myelodysplastic syndrome) ([place]) 01/05/2014   Past Surgical History  Procedure Laterality Date  . Tonsillectomy    . Hiatal hernia repair    . Hernia repair  1979    bilateral inguinal hernia  . Thyroid lobectomy  6/85    right  . Acromionectomy  1998    rotator cuff repair  . Prostate surgery    . Abdominal aortic aneurysm repair    . Tonsillectomy    . Flexible sigmoidoscopy N/A 12/13/2012    Procedure: FLEXIBLE SIGMOIDOSCOPY;  Surgeon: Beryle Beams, MD;  Location: [facility] ENDOSCOPY;  Service: Endoscopy;  Laterality: N/A;  . Hot hemostasis N/A 12/13/2012    Procedure: HOT HEMOSTASIS (ARGON PLASMA COAGULATION/BICAP);  Surgeon:  Beryle Beams, MD;  Location: [facility];  Service: Endoscopy;  Laterality: N/A;  . Pacemaker insertion  11-03-13    STJ dual chamber pacemaker implanted by Dr Lovena Le for CHB  . Permanent pacemaker insertion N/A 11/03/2013    Procedure: PERMANENT PACEMAKER INSERTION;  Surgeon: Evans Lance, MD;  Location: [facility] CATH LAB;  Service: Cardiovascular;  Laterality: N/A;  . Flexible sigmoidoscopy N/A 03/12/2015    Procedure: FLEXIBLE SIGMOIDOSCOPY;  Surgeon: Carol Ada, MD;  Location: [facility] ENDOSCOPY;  Service: Endoscopy;  Laterality: N/A;  will need APC   . Hot hemostasis N/A 03/12/2015    Procedure: HOT HEMOSTASIS (ARGON PLASMA COAGULATION/BICAP);  Surgeon: Carol Ada, MD;  Location: [facility];  Service: Endoscopy;  Laterality: N/A;   Family History  Problem Relation Age of Onset  . Heart disease Mother   . Heart disease Father   . Heart disease Son   . Hyperlipidemia Son    Social History  Substance Use Topics  . Smoking status: Former Smoker    Types: Pipe    Quit date: 11/27/1963  . Smokeless tobacco: Never Used  . Alcohol Use: No    Review of Systems  Constitutional: Negative for fever and chills.  HENT: Negative for congestion and drooling.   Eyes: Negative for photophobia.  Respiratory: Negative for cough and shortness of breath.   Cardiovascular: Negative for chest pain and leg swelling.  Gastrointestinal: Positive for hematochezia. Negative for vomiting,  abdominal pain and hematemesis.  Endocrine: Negative for polydipsia and polyuria.  Neurological: Negative for dizziness, loss of consciousness and light-headedness.  All other systems reviewed and are negative.     Allergies  Codeine sulfate and Penicillins  Home Medications   Prior to Admission medications   Medication Sig Start Date End Date Taking? Authorizing Provider  alfuzosin (UROXATRAL) 10 MG 24 hr tablet Take 10 mg by mouth every evening.    Yes Historical Provider, MD  furosemide (LASIX) 20 MG tablet TAKE  1 TABLET DAILY Patient taking differently: TAKE 20 MG BY MOUTH DAILY 02/09/15  Yes Evans Lance, MD  hydrocortisone-pramoxine [facility]) 2.5-1 % rectal cream Place rectally 4 (four) times daily. Patient taking differently: Place 1 application rectally 4 (four) times daily as needed for itching.  03/14/15  Yes Christina P Rama, MD  lisinopril (PRINIVIL,ZESTRIL) 10 MG tablet Take 10 mg by mouth every morning.   Yes Historical Provider, MD  loperamide (IMODIUM) 2 MG capsule Take 2 mg by mouth daily as needed for diarrhea or loose stools.   Yes Historical Provider, MD  Multiple Vitamin (MULTIVITAMIN WITH MINERALS) TABS Take 1 tablet by mouth every morning.   Yes Historical Provider, MD  SYNTHROID 125 MCG tablet Take 125 mcg by mouth daily before breakfast.  09/15/14  Yes Historical Provider, MD  TOPROL XL 25 MG 24 hr tablet Take 25 mg by mouth daily. 12/24/13  Yes Historical Provider, MD  acetaminophen (TYLENOL) 500 MG tablet Take 1 tablet (500 mg total) by mouth every 6 (six) hours as needed. Patient taking differently: Take 500 mg by mouth every 6 (six) hours as needed for mild pain, moderate pain or headache.  12/19/14   Merryl Hacker, MD  calcium carbonate (TUMS - DOSED IN MG ELEMENTAL CALCIUM) 500 MG chewable tablet Chew 1 tablet by mouth daily as needed for indigestion or heartburn.    Historical Provider, MD  ketoconazole (NIZORAL) 2 % cream Apply 1 application topically daily as needed for irritation (jock itch).  09/07/14   Historical Provider, MD   BP 147/54 mmHg  Pulse 61  Temp(Src) 97.9 F (36.6 C) (Oral)  Resp 18  SpO2 97% Physical Exam  Constitutional: He is oriented to person, place, and time. He appears well-developed and well-nourished.  HENT:  Head: Normocephalic and atraumatic.  Eyes: Conjunctivae and EOM are normal.  Neck: Normal range of motion. Neck supple.  Cardiovascular: Normal rate and regular rhythm.   Pulmonary/Chest: Effort normal. No respiratory distress.   Abdominal: Soft. There is no tenderness.  Genitourinary:  External hemorrhoid, hemostatic.   Musculoskeletal: Normal range of motion. He exhibits no edema or tenderness.  Neurological: He is alert and oriented to person, place, and time.  Skin: Skin is warm and dry.  Nursing note and vitals reviewed.   ED Course  Procedures (including critical care time) Labs Review Labs Reviewed  COMPREHENSIVE METABOLIC PANEL - Abnormal; Notable for the following:    Glucose, Bld 154 (*)    BUN 32 (*)    Creatinine, Ser 1.56 (*)    AST 78 (*)    Alkaline Phosphatase 224 (*)    Total Bilirubin 1.3 (*)    GFR calc non Af Amer 37 (*)    GFR calc Af Amer 43 (*)    All other components within normal limits  CBC - Abnormal; Notable for the following:    RBC 2.84 (*)    Hemoglobin 8.9 (*)    HCT 27.3 (*)  RDW 25.6 (*)    Platelets 67 (*)    All other components within normal limits  I-STAT CHEM 8, ED - Abnormal; Notable for the following:    BUN 39 (*)    Creatinine, Ser 1.60 (*)    Glucose, Bld 152 (*)    Hemoglobin 10.9 (*)    HCT 32.0 (*)    All other components within normal limits  TYPE AND SCREEN    Imaging Review No results found. I have personally reviewed and evaluated these images and lab results as part of my medical decision-making.   EKG Interpretation None      MDM   Final diagnoses:  Bleeding hemorrhoid   Rectal bleeding, h/o same. Asymptomatic. Will check hemoglobin, get orthostatics and observe iN ED for short period then make disposition accordingly.   Hemoglobin is stable from multiple previous. Small amount of rectal bleeding the emergency department from hemorrhoid. This ceases on its own. Discussed the patient family there is no acute causes for surgical consult for admission to the hospital. Patient needed follow-up for repeat hemoglobins to make sure that they stayed stable and follow-up with surgeon.  I have personally and contemperaneously reviewed  labs and imaging and used in my decision making as above.   A medical screening exam was performed and I feel the patient has had an appropriate workup for their chief complaint at this time and likelihood of emergent condition existing is low. They have been counseled on decision, discharge, follow up and which symptoms necessitate immediate return to the emergency department. They or their family verbally stated understanding and agreement with plan and discharged in stable condition.      Merrily Pew, MD 05/07/15 [phone]

## 2015-05-18 ENCOUNTER — Ambulatory Visit (INDEPENDENT_AMBULATORY_CARE_PROVIDER_SITE_OTHER): Payer: Medicare Other | Admitting: *Deleted

## 2015-05-18 ENCOUNTER — Telehealth: Payer: Self-pay | Admitting: *Deleted

## 2015-05-18 DIAGNOSIS — I442 Atrioventricular block, complete: Secondary | ICD-10-CM | POA: Diagnosis not present

## 2015-05-18 NOTE — Telephone Encounter (Signed)
Called patient about bleeding and what Dr Alvy Bimler suggested to do.  Told him to use sitz bath and that she would check him when he comes in for office visit this month. He states that the bleeding is minimal with just a trickle of bleeding occasionally.  Has not had to 'clean up' the bathroom like he was doing.

## 2015-05-18 NOTE — Progress Notes (Signed)
Remote pacemaker transmission.   

## 2015-05-20 ENCOUNTER — Telehealth: Payer: Self-pay | Admitting: Hematology and Oncology

## 2015-05-20 ENCOUNTER — Ambulatory Visit (HOSPITAL_BASED_OUTPATIENT_CLINIC_OR_DEPARTMENT_OTHER): Payer: Medicare Other

## 2015-05-20 ENCOUNTER — Ambulatory Visit: Payer: Medicare Other

## 2015-05-20 ENCOUNTER — Other Ambulatory Visit (HOSPITAL_BASED_OUTPATIENT_CLINIC_OR_DEPARTMENT_OTHER): Payer: Medicare Other

## 2015-05-20 ENCOUNTER — Encounter: Payer: Self-pay | Admitting: Hematology and Oncology

## 2015-05-20 ENCOUNTER — Other Ambulatory Visit: Payer: Medicare Other

## 2015-05-20 ENCOUNTER — Ambulatory Visit (HOSPITAL_BASED_OUTPATIENT_CLINIC_OR_DEPARTMENT_OTHER): Payer: Medicare Other | Admitting: Hematology and Oncology

## 2015-05-20 VITALS — BP 138/52 | HR 69 | Temp 98.1°F | Resp 18 | Ht 68.0 in | Wt 157.9 lb

## 2015-05-20 DIAGNOSIS — D631 Anemia in chronic kidney disease: Secondary | ICD-10-CM

## 2015-05-20 DIAGNOSIS — N183 Chronic kidney disease, stage 3 unspecified: Secondary | ICD-10-CM

## 2015-05-20 DIAGNOSIS — I714 Abdominal aortic aneurysm, without rupture: Secondary | ICD-10-CM

## 2015-05-20 DIAGNOSIS — D696 Thrombocytopenia, unspecified: Secondary | ICD-10-CM

## 2015-05-20 DIAGNOSIS — D469 Myelodysplastic syndrome, unspecified: Secondary | ICD-10-CM

## 2015-05-20 DIAGNOSIS — D630 Anemia in neoplastic disease: Secondary | ICD-10-CM

## 2015-05-20 DIAGNOSIS — I713 Abdominal aortic aneurysm, ruptured, unspecified: Secondary | ICD-10-CM

## 2015-05-20 DIAGNOSIS — K625 Hemorrhage of anus and rectum: Secondary | ICD-10-CM

## 2015-05-20 LAB — CUP PACEART REMOTE DEVICE CHECK
Battery Remaining Percentage: 95.5 %
Battery Voltage: 3.01 V
Brady Statistic AP VP Percent: 51 %
Brady Statistic AS VS Percent: 1 %
Brady Statistic RA Percent Paced: 49 %
Brady Statistic RV Percent Paced: 99 %
Implantable Lead Location: 753860
Lead Channel Impedance Value: 440 Ohm
Lead Channel Pacing Threshold Amplitude: 0.75 V
Lead Channel Pacing Threshold Pulse Width: 0.4 ms
Lead Channel Pacing Threshold Pulse Width: 0.4 ms
Lead Channel Sensing Intrinsic Amplitude: 3.1 mV
Lead Channel Setting Sensing Sensitivity: 4 mV
MDC IDC LEAD IMPLANT DT: 20150427
MDC IDC LEAD IMPLANT DT: 20150427
MDC IDC LEAD LOCATION: 753859
MDC IDC MSMT BATTERY REMAINING LONGEVITY: 122 mo
MDC IDC MSMT LEADCHNL RV IMPEDANCE VALUE: 430 Ohm
MDC IDC MSMT LEADCHNL RV PACING THRESHOLD AMPLITUDE: 1.125 V
MDC IDC PG SERIAL: 3013631
MDC IDC SESS DTM: 20161108074715
MDC IDC SET LEADCHNL RA PACING AMPLITUDE: 1.75 V
MDC IDC SET LEADCHNL RV PACING AMPLITUDE: 1.375
MDC IDC SET LEADCHNL RV PACING PULSEWIDTH: 0.4 ms
MDC IDC STAT BRADY AP VS PERCENT: 1 %
MDC IDC STAT BRADY AS VP PERCENT: 48 %

## 2015-05-20 LAB — CBC WITH DIFFERENTIAL/PLATELET
BASO%: 0.4 % (ref 0.0–2.0)
BASOS ABS: 0 10*3/uL (ref 0.0–0.1)
EOS%: 2.9 % (ref 0.0–7.0)
Eosinophils Absolute: 0.2 10*3/uL (ref 0.0–0.5)
HEMATOCRIT: 25.8 % — AB (ref 38.4–49.9)
HEMOGLOBIN: 8.3 g/dL — AB (ref 13.0–17.1)
LYMPH#: 1.3 10*3/uL (ref 0.9–3.3)
LYMPH%: 24 % (ref 14.0–49.0)
MCH: 31.1 pg (ref 27.2–33.4)
MCHC: 32.2 g/dL (ref 32.0–36.0)
MCV: 96.6 fL (ref 79.3–98.0)
MONO#: 0.6 10*3/uL (ref 0.1–0.9)
MONO%: 10.9 % (ref 0.0–14.0)
NEUT#: 3.2 10*3/uL (ref 1.5–6.5)
NEUT%: 61.8 % (ref 39.0–75.0)
NRBC: 0 % (ref 0–0)
Platelets: 64 10*3/uL — ABNORMAL LOW (ref 140–400)
RBC: 2.67 10*6/uL — ABNORMAL LOW (ref 4.20–5.82)
RDW: 25.1 % — AB (ref 11.0–14.6)
WBC: 5.2 10*3/uL (ref 4.0–10.3)

## 2015-05-20 LAB — TECHNOLOGIST REVIEW

## 2015-05-20 MED ORDER — DARBEPOETIN ALFA 500 MCG/ML IJ SOSY
500.0000 ug | PREFILLED_SYRINGE | Freq: Once | INTRAMUSCULAR | Status: AC
  Administered 2015-05-20: 500 ug via SUBCUTANEOUS
  Filled 2015-05-20: qty 1

## 2015-05-20 MED ORDER — AMINOCAPROIC ACID 500 MG PO TABS: 500.0000 mg | ORAL_TABLET | Freq: Three times a day (TID) | ORAL | Status: DC

## 2015-05-20 NOTE — Assessment & Plan Note (Signed)
Given his multiple comorbidities, he may have an element of anemia chronic disease, likely myelodysplastic syndrome with intermittent bleeding At this point in time, I am hesitant to draw a bone marrow aspirate and biopsy. He has responded very well to Aranesp until recently which I suspect the drop of hemoglobin and platelet was due to consumption I plan to continue his injection at 500 g every 2 weeks to keep hemoglobin above 11 g.

## 2015-05-20 NOTE — Assessment & Plan Note (Signed)
The most recent CT scan report which show evidence of leak around the aneurysm. This consistently can cause chronic consumption of platelets According to the last visit with the vascular surgeon, due to his age and comorbidities, he is currently on medical management only.

## 2015-05-20 NOTE — Patient Instructions (Signed)
Aminocaproic Acid tablets What is this medicine? AMINOCAPROIC ACID (a mee noe ka PROE ik AS id) slows down or stops blood clots from being broken down. This medicine helps to prevent or treat excessive bleeding. This medicine may be used for other purposes; ask your health care provider or pharmacist if you have questions. What should I tell my health care provider before I take this medicine? They need to know if you have any of these conditions: -blood clotting problems -blood in urine -kidney disease -an unusual or allergic reaction to aminocaproic acid, other medicines, foods, dyes, or preservatives -pregnant or trying to get pregnant -breast-feeding How should I use this medicine? Take this medicine by mouth with a glass of water. Follow the directions on the prescription label. Take your doses at regular intervals. Do not take your medicine more often than directed. Do not stop taking except on the advice of your doctor or health care professional. Talk to your pediatrician regarding the use of this medicine in children. Special care may be needed. Overdosage: If you think you have taken too much of this medicine contact a poison control center or emergency room at once. NOTE: This medicine is only for you. Do not share this medicine with others. What if I miss a dose? If you miss a dose, take it as soon as you can. If it is almost time for your next dose, take only that dose. Do not take double or extra doses. What may interact with this medicine? Do not take this medicine with any of the following medications: -agents that dissolve blood clots This medicine may also interact with the following medications: -factor IX This list may not describe all possible interactions. Give your health care provider a list of all the medicines, herbs, non-prescription drugs, or dietary supplements you use. Also tell them if you smoke, drink alcohol, or use illegal drugs. Some items may interact with  your medicine. What should I watch for while using this medicine? Visit your doctor or health care professional for regular checks on your progress. If you take this medicine for a long time your doctor or health care professional will schedule tests to make sure the medicine is working properly. You may get drowsy or dizzy. Do not drive, use machinery, or do anything that needs mental alertness until you know how this medicine affects you. Do not stand or sit up quickly, especially if you are an older patient. This reduces the risk of dizzy or fainting spells. What side effects may I notice from receiving this medicine? Side effects that you should report to your doctor or health care professional as soon as possible: -allergic reactions like skin rash, itching or hives, swelling of the face, lips, or tongue -breathing problems -changes in vision -chest pain -muscle aches or pains -problems with balance, talking, walking -ringing in the ears -seizure -sudden or severe pain in the chest, legs, head, or groin -trouble passing urine or change in the amount of urine -unexplained weight gain Side effects that usually do not require medical attention (report to your doctor or health care professional if they continue or are bothersome): -change in sex drive or performance -diarrhea -headache -nausea, vomiting -unusual menstrual pain -unusually tired This list may not describe all possible side effects. Call your doctor for medical advice about side effects. You may report side effects to FDA at 1-800-FDA-1088. Where should I keep my medicine? Keep out of the reach of children. Store at room temperature between 15  and 30 degrees C (59 and 86 degrees F). Keep container tightly closed. Throw away any unused medicine after the expiration date. NOTE: This sheet is a summary. It may not cover all possible information. If you have questions about this medicine, talk to your doctor, pharmacist, or  health care provider.    2016, Elsevier/Gold Standard. (2007-09-18 11:34:49) Aminocaproic Acid tablets What is this medicine? AMINOCAPROIC ACID (a mee noe ka PROE ik AS id) slows down or stops blood clots from being broken down. This medicine helps to prevent or treat excessive bleeding. This medicine may be used for other purposes; ask your health care provider or pharmacist if you have questions. What should I tell my health care provider before I take this medicine? They need to know if you have any of these conditions: -blood clotting problems -blood in urine -kidney disease -an unusual or allergic reaction to aminocaproic acid, other medicines, foods, dyes, or preservatives -pregnant or trying to get pregnant -breast-feeding How should I use this medicine? Take this medicine by mouth with a glass of water. Follow the directions on the prescription label. Take your doses at regular intervals. Do not take your medicine more often than directed. Do not stop taking except on the advice of your doctor or health care professional. Talk to your pediatrician regarding the use of this medicine in children. Special care may be needed. Overdosage: If you think you have taken too much of this medicine contact a poison control center or emergency room at once. NOTE: This medicine is only for you. Do not share this medicine with others. What if I miss a dose? If you miss a dose, take it as soon as you can. If it is almost time for your next dose, take only that dose. Do not take double or extra doses. What may interact with this medicine? Do not take this medicine with any of the following medications: -agents that dissolve blood clots This medicine may also interact with the following medications: -factor IX This list may not describe all possible interactions. Give your health care provider a list of all the medicines, herbs, non-prescription drugs, or dietary supplements you use. Also tell them  if you smoke, drink alcohol, or use illegal drugs. Some items may interact with your medicine. What should I watch for while using this medicine? Visit your doctor or health care professional for regular checks on your progress. If you take this medicine for a long time your doctor or health care professional will schedule tests to make sure the medicine is working properly. You may get drowsy or dizzy. Do not drive, use machinery, or do anything that needs mental alertness until you know how this medicine affects you. Do not stand or sit up quickly, especially if you are an older patient. This reduces the risk of dizzy or fainting spells. What side effects may I notice from receiving this medicine? Side effects that you should report to your doctor or health care professional as soon as possible: -allergic reactions like skin rash, itching or hives, swelling of the face, lips, or tongue -breathing problems -changes in vision -chest pain -muscle aches or pains -problems with balance, talking, walking -ringing in the ears -seizure -sudden or severe pain in the chest, legs, head, or groin -trouble passing urine or change in the amount of urine -unexplained weight gain Side effects that usually do not require medical attention (report to your doctor or health care professional if they continue or are bothersome): -change  in sex drive or performance -diarrhea -headache -nausea, vomiting -unusual menstrual pain -unusually tired This list may not describe all possible side effects. Call your doctor for medical advice about side effects. You may report side effects to FDA at 1-800-FDA-1088. Where should I keep my medicine? Keep out of the reach of children. Store at room temperature between 15 and 30 degrees C (59 and 86 degrees F). Keep container tightly closed. Throw away any unused medicine after the expiration date. NOTE: This sheet is a summary. It may not cover all possible information. If  you have questions about this medicine, talk to your doctor, pharmacist, or health care provider.    2016, Elsevier/Gold Standard. (2007-09-18 11:34:49)

## 2015-05-20 NOTE — Assessment & Plan Note (Signed)
This is stable while on diuretics. 

## 2015-05-20 NOTE — Assessment & Plan Note (Signed)
He has chronic intermittent rectal bleeding. 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 His rectal bleeding has stopped recently. I recommend a trial of Amicar if he bleeds again I will get the insurance preauthorization and determine what would be the cost of the co-pay. If the bleeding resume again, I would recommend 500 mg every 8 hours Amicar for 7-10 days to stop the bleeding. With this low platelet count & radiation proctitis, I think he would be at risk for further bleeding in the future.

## 2015-05-20 NOTE — Telephone Encounter (Signed)
per pof to sch pt appt-gave pt ocpy of avs °

## 2015-05-20 NOTE — Assessment & Plan Note (Addendum)
The most likely cause of this is due to consumption from bleeding and CT scan April 2016 showed liver cirrhosis and splenomegaly Low-grade myelodysplastic syndrome can also cause thrombocytopenia. The present time, he can be observed as long as platelet count is greater than 50,000.

## 2015-05-20 NOTE — Telephone Encounter (Signed)
peer pof to sch pt appt-gave pt copy of avs

## 2015-05-20 NOTE — Progress Notes (Signed)
[facility] OFFICE PROGRESS NOTE  Patient Care Team: Merrilee Seashore, MD as PCP - General (Internal Medicine) Heath Lark, MD as Consulting Physician (Hematology and Oncology)  SUMMARY OF ONCOLOGIC HISTORY: Oncology History   MDS, low IPSS score     MDS (myelodysplastic syndrome) ([place])   12/09/2004 Bone Marrow Biopsy Bone marrow biopsy showed HYPERCELLULAR MARROW WITH DYSPOIETIC FEATURES AND RING SIDEROBLASTS,  FAVOR MYELODYSPLASTIC SYNDROME   05/26/2011 - 01/01/2014 Chemotherapy The patient was given Procrit every 3 weeks 40,000 units for anemia.   01/22/2014 -  Chemotherapy He is placed on Aranesp 300 mcg   03/26/2014 -  Chemotherapy Aranesp is increased to 500 mcg   Since the last time he was seen here, he had recurrent visits to the emergency department and hospitalization. He had received 2 units of blood transfusion recently because of profuse rectal bleeding. Sigmoidoscopy showed changes consistent with hemorrhoids and radiation proctitis  INTERVAL HISTORY: Please see below for problem oriented charting. He has not had any bleeding for the past few days. Apart from rectal bleeding, he denies epistaxis or hematuria. He complained of low energy level. Denies recent infection.  REVIEW OF SYSTEMS:   Constitutional: Denies fevers, chills or abnormal weight loss Eyes: Denies blurriness of vision Ears, nose, mouth, throat, and face: Denies mucositis or sore throat Respiratory: Denies cough, dyspnea or wheezes Cardiovascular: Denies palpitation, chest discomfort or lower extremity swelling Gastrointestinal:  Denies nausea, heartburn or change in bowel habits Skin: Denies abnormal skin rashes Lymphatics: Denies new lymphadenopathy  Neurological:Denies numbness, tingling or new weaknesses Behavioral/Psych: Mood is stable, no new changes  All other systems were reviewed with the patient and are negative.  I have reviewed the past medical history, past surgical history,  social history and family history with the patient and they are unchanged from previous note.  ALLERGIES:  is allergic to codeine sulfate and penicillins.  MEDICATIONS:  Current Outpatient Prescriptions  Medication Sig Dispense Refill  . acetaminophen (TYLENOL) 500 MG tablet Take 1 tablet (500 mg total) by mouth every 6 (six) hours as needed. (Patient taking differently: Take 500 mg by mouth every 6 (six) hours as needed for mild pain, moderate pain or headache. ) 30 tablet 0  . alfuzosin (UROXATRAL) 10 MG 24 hr tablet Take 10 mg by mouth every evening.     . calcium carbonate (TUMS - DOSED IN MG ELEMENTAL CALCIUM) 500 MG chewable tablet Chew 1 tablet by mouth daily as needed for indigestion or heartburn.    . furosemide (LASIX) 20 MG tablet TAKE 1 TABLET DAILY (Patient taking differently: TAKE 20 MG BY MOUTH DAILY) 90 tablet 2  . hydrocortisone-pramoxine (ANALPRAM-HC) 2.5-1 % rectal cream Place rectally 4 (four) times daily. (Patient taking differently: Place 1 application rectally 4 (four) times daily as needed for itching. ) 30 g 0  . ketoconazole (NIZORAL) 2 % cream Apply 1 application topically daily as needed for irritation (jock itch).     Marland Kitchen lisinopril (PRINIVIL,ZESTRIL) 10 MG tablet Take 10 mg by mouth every morning.    . loperamide (IMODIUM) 2 MG capsule Take 2 mg by mouth daily as needed for diarrhea or loose stools.    . Multiple Vitamin (MULTIVITAMIN WITH MINERALS) TABS Take 1 tablet by mouth every morning.    Marland Kitchen SYNTHROID 125 MCG tablet Take 125 mcg by mouth daily before breakfast.     . TOPROL XL 25 MG 24 hr tablet Take 25 mg by mouth daily.    Marland Kitchen aminocaproic acid (AMICAR)  500 MG tablet Take 1 tablet (500 mg total) by mouth every 8 (eight) hours. 30 tablet 0   No current facility-administered medications for this visit.    PHYSICAL EXAMINATION: ECOG PERFORMANCE STATUS: 1 - Symptomatic but completely ambulatory  Filed Vitals:   05/20/15 1054  BP: 138/52  Pulse: 69  Temp:  98.1 F (36.7 C)  Resp: 18   Filed Weights   05/20/15 1054  Weight: 157 lb 14.4 oz (71.623 kg)    GENERAL:alert, no distress and comfortable SKIN: skin color is pale, texture, turgor are normal, no rashes or significant lesions EYES: normal, Conjunctiva are pale and non-injected, sclera clear Musculoskeletal:no cyanosis of digits and no clubbing  NEURO: alert & oriented x 3 with fluent speech, no focal motor/sensory deficits  LABORATORY DATA:  I have reviewed the data as listed    Component Value Date/Time   NA 138 05/06/2015 1830   NA 138 01/05/2014 1159   K 4.5 05/06/2015 1830   K 4.6 01/05/2014 1159   CL 104 05/06/2015 1830   CO2 25 05/06/2015 1825   CO2 25 01/05/2014 1159   GLUCOSE 152* 05/06/2015 1830   GLUCOSE 94 01/05/2014 1159   BUN 39* 05/06/2015 1830   BUN 25.0 01/05/2014 1159   CREATININE 1.60* 05/06/2015 1830   CREATININE 1.7* 01/05/2014 1159   CALCIUM 9.1 05/06/2015 1825   CALCIUM 9.3 01/05/2014 1159   PROT 7.2 05/06/2015 1825   PROT 7.1 01/05/2014 1159   ALBUMIN 3.9 05/06/2015 1825   ALBUMIN 3.7 01/05/2014 1159   AST 78* 05/06/2015 1825   AST 64* 01/05/2014 1159   ALT 53 05/06/2015 1825   ALT 70* 01/05/2014 1159   ALKPHOS 224* 05/06/2015 1825   ALKPHOS 254* 01/05/2014 1159   BILITOT 1.3* 05/06/2015 1825   BILITOT 0.91 01/05/2014 1159   GFRNONAA 37* 05/06/2015 1825   GFRAA 43* 05/06/2015 1825    No results found for: SPEP, UPEP  Lab Results  Component Value Date   WBC 5.2 05/20/2015   NEUTROABS 3.2 05/20/2015   HGB 8.3* 05/20/2015   HCT 25.8* 05/20/2015   MCV 96.6 05/20/2015   PLT 64* 05/20/2015      Chemistry      Component Value Date/Time   NA 138 05/06/2015 1830   NA 138 01/05/2014 1159   K 4.5 05/06/2015 1830   K 4.6 01/05/2014 1159   CL 104 05/06/2015 1830   CO2 25 05/06/2015 1825   CO2 25 01/05/2014 1159   BUN 39* 05/06/2015 1830   BUN 25.0 01/05/2014 1159   CREATININE 1.60* 05/06/2015 1830   CREATININE 1.7* 01/05/2014 1159       Component Value Date/Time   CALCIUM 9.1 05/06/2015 1825   CALCIUM 9.3 01/05/2014 1159   ALKPHOS 224* 05/06/2015 1825   ALKPHOS 254* 01/05/2014 1159   AST 78* 05/06/2015 1825   AST 64* 01/05/2014 1159   ALT 53 05/06/2015 1825   ALT 70* 01/05/2014 1159   BILITOT 1.3* 05/06/2015 1825   BILITOT 0.91 01/05/2014 1159     ASSESSMENT & PLAN:  MDS (myelodysplastic syndrome) Given his multiple comorbidities, he may have an element of anemia chronic disease, likely myelodysplastic syndrome with intermittent bleeding At this point in time, I am hesitant to draw a bone marrow aspirate and biopsy. He has responded very well to Aranesp until recently which I suspect the drop of hemoglobin and platelet was due to consumption I plan to continue his injection at 500 g every 2 weeks to keep  hemoglobin above 11 g.   Thrombocytopenia The most likely cause of this is due to consumption from bleeding and CT scan April 2016 showed liver cirrhosis and splenomegaly Low-grade myelodysplastic syndrome can also cause thrombocytopenia. The present time, he can be observed as long as platelet count is greater than 50,000.    Rectal bleeding He has chronic intermittent rectal bleeding. 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 His rectal bleeding has stopped recently. I recommend a trial of Amicar if he bleeds again I will get the insurance preauthorization and determine what would be the cost of the co-pay. If the bleeding resume again, I would recommend 500 mg every 8 hours Amicar for 7-10 days to stop the bleeding. With this low platelet count & radiation proctitis, I think he would be at risk for further bleeding in the future.  Stage III chronic kidney disease This is stable while on diuretics.    Leaking abdominal aortic aneurysm The most recent CT scan report which show evidence of leak around the aneurysm. This consistently can cause  chronic consumption of platelets According to the last visit with the vascular surgeon, due to his age and comorbidities, he is currently on medical management only.    Orders Placed This Encounter  Procedures  . Vitamin B12    Standing Status: Future     Number of Occurrences:      Standing Expiration Date: 06/23/2016  . Ferritin    Standing Status: Future     Number of Occurrences:      Standing Expiration Date: 06/23/2016  . Erythropoietin    Standing Status: Future     Number of Occurrences:      Standing Expiration Date: 06/23/2016  . Iron and TIBC    Standing Status: Future     Number of Occurrences:      Standing Expiration Date: 06/23/2016   All questions were answered. The patient knows to call the clinic with any problems, questions or concerns. No barriers to learning was detected. I spent 20 minutes counseling the patient face to face. The total time spent in the appointment was 25 minutes and more than 50% was on counseling and review of test results     [facility], [place], MD 05/20/2015 1:25 PM

## 2015-05-21 ENCOUNTER — Encounter: Payer: Self-pay | Admitting: Cardiology

## 2015-06-04 ENCOUNTER — Ambulatory Visit (HOSPITAL_COMMUNITY)
Admission: RE | Admit: 2015-06-04 | Discharge: 2015-06-04 | Disposition: A | Discharge status: Home / Self Care | Payer: Medicare Other | Source: Ambulatory Visit | Attending: Hematology and Oncology | Admitting: Hematology and Oncology

## 2015-06-04 ENCOUNTER — Other Ambulatory Visit (HOSPITAL_BASED_OUTPATIENT_CLINIC_OR_DEPARTMENT_OTHER): Payer: Medicare Other

## 2015-06-04 ENCOUNTER — Ambulatory Visit (HOSPITAL_BASED_OUTPATIENT_CLINIC_OR_DEPARTMENT_OTHER): Payer: Medicare Other

## 2015-06-04 VITALS — BP 141/70 | HR 60 | Temp 98.4°F

## 2015-06-04 DIAGNOSIS — D631 Anemia in chronic kidney disease: Secondary | ICD-10-CM

## 2015-06-04 DIAGNOSIS — D630 Anemia in neoplastic disease: Secondary | ICD-10-CM

## 2015-06-04 DIAGNOSIS — N183 Chronic kidney disease, stage 3 (moderate): Secondary | ICD-10-CM

## 2015-06-04 DIAGNOSIS — D469 Myelodysplastic syndrome, unspecified: Secondary | ICD-10-CM | POA: Insufficient documentation

## 2015-06-04 LAB — CBC WITH DIFFERENTIAL/PLATELET
BASO%: 1.5 % (ref 0.0–2.0)
BASOS ABS: 0.1 10*3/uL (ref 0.0–0.1)
EOS ABS: 0.2 10*3/uL (ref 0.0–0.5)
EOS%: 2.6 % (ref 0.0–7.0)
HCT: 24.2 % — ABNORMAL LOW (ref 38.4–49.9)
HEMOGLOBIN: 7.6 g/dL — AB (ref 13.0–17.1)
LYMPH%: 18.9 % (ref 14.0–49.0)
MCH: 31.7 pg (ref 27.2–33.4)
MCHC: 31.4 g/dL — ABNORMAL LOW (ref 32.0–36.0)
MCV: 101.2 fL — AB (ref 79.3–98.0)
MONO#: 0.9 10*3/uL (ref 0.1–0.9)
MONO%: 9.8 % (ref 0.0–14.0)
NEUT#: 6.3 10*3/uL (ref 1.5–6.5)
NEUT%: 67.2 % (ref 39.0–75.0)
PLATELETS: 74 10*3/uL — AB (ref 140–400)
RBC: 2.39 10*6/uL — ABNORMAL LOW (ref 4.20–5.82)
RDW: 26.4 % — AB (ref 11.0–14.6)
WBC: 9.4 10*3/uL (ref 4.0–10.3)
lymph#: 1.8 10*3/uL (ref 0.9–3.3)

## 2015-06-04 MED ORDER — DARBEPOETIN ALFA 500 MCG/ML IJ SOSY
500.0000 ug | PREFILLED_SYRINGE | Freq: Once | INTRAMUSCULAR | Status: AC
  Administered 2015-06-04: 500 ug via SUBCUTANEOUS
  Filled 2015-06-04: qty 1

## 2015-06-04 NOTE — Progress Notes (Signed)
CBC reviewed w/ Dr. Alen Blew.   He says no transfusion needed unless pt SOB or having Chest Pains.   Pt denies any increase SOB or chest pains today.  He denies any bleeding and states he actually feels pretty good.  Aranesp given as ordered.  Instructed pt to call if he has any worsening symptoms prior to next appt.  Go to ED for any chest pains.  He verbalized understanding.

## 2015-06-07 ENCOUNTER — Telehealth: Payer: Self-pay | Admitting: *Deleted

## 2015-06-07 ENCOUNTER — Telehealth: Payer: Self-pay | Admitting: Hematology and Oncology

## 2015-06-07 ENCOUNTER — Other Ambulatory Visit: Payer: Self-pay | Admitting: Hematology and Oncology

## 2015-06-07 DIAGNOSIS — D469 Myelodysplastic syndrome, unspecified: Secondary | ICD-10-CM

## 2015-06-07 NOTE — Telephone Encounter (Signed)
Anthony Lark, MD at 06/07/2015 11:38 AM     Status: Signed       Expand All Collapse All   I placed order and labs for tomorrow. Tell him not to leave until we have results Did he start taking the Amicar that I prescribed?       Pt states he is taking the Amicar regularly. Will wait tomorrow for Dr Alvy Bimler to review labs

## 2015-06-07 NOTE — Telephone Encounter (Signed)
I placed order and labs for tomorrow. Tell him not to leave until we have results Did he start taking the Amicar that I prescribed?

## 2015-06-07 NOTE — Telephone Encounter (Signed)
lvm for pt regarding to 11.29 appt.Marland KitchenMarland KitchenMarland Kitchen

## 2015-06-07 NOTE — Telephone Encounter (Signed)
-----   Message from Heath Lark, MD sent at 06/07/2015  7:06 AM EST ----- Regarding: cbc He is quite anemic last week Not sure if he is still bleeding I recommend recheck cbc this week in case it drops further

## 2015-06-07 NOTE — Telephone Encounter (Signed)
Pt states he had a few spots of blood on tissue after BM. States he feels fine. Is OK coming back for labs this week

## 2015-06-08 ENCOUNTER — Ambulatory Visit (HOSPITAL_BASED_OUTPATIENT_CLINIC_OR_DEPARTMENT_OTHER): Payer: Medicare Other

## 2015-06-08 ENCOUNTER — Other Ambulatory Visit (HOSPITAL_BASED_OUTPATIENT_CLINIC_OR_DEPARTMENT_OTHER): Payer: Medicare Other

## 2015-06-08 ENCOUNTER — Other Ambulatory Visit: Payer: Self-pay | Admitting: *Deleted

## 2015-06-08 ENCOUNTER — Ambulatory Visit (HOSPITAL_COMMUNITY)
Admission: RE | Admit: 2015-06-08 | Discharge: 2015-06-08 | Disposition: A | Discharge status: Home / Self Care | Payer: Medicare Other | Source: Ambulatory Visit | Attending: Hematology | Admitting: Hematology

## 2015-06-08 VITALS — BP 144/53 | HR 63 | Temp 98.1°F | Resp 25

## 2015-06-08 DIAGNOSIS — D469 Myelodysplastic syndrome, unspecified: Secondary | ICD-10-CM

## 2015-06-08 DIAGNOSIS — D631 Anemia in chronic kidney disease: Secondary | ICD-10-CM

## 2015-06-08 DIAGNOSIS — N183 Chronic kidney disease, stage 3 (moderate): Secondary | ICD-10-CM

## 2015-06-08 DIAGNOSIS — D630 Anemia in neoplastic disease: Secondary | ICD-10-CM

## 2015-06-08 LAB — CBC WITH DIFFERENTIAL/PLATELET
BASO%: 0.5 % (ref 0.0–2.0)
BASOS ABS: 0 10*3/uL (ref 0.0–0.1)
EOS ABS: 0.1 10*3/uL (ref 0.0–0.5)
EOS%: 2.6 % (ref 0.0–7.0)
HCT: 19.5 % — ABNORMAL LOW (ref 38.4–49.9)
HGB: 6.3 g/dL — CL (ref 13.0–17.1)
LYMPH%: 24.1 % (ref 14.0–49.0)
MCH: 31.5 pg (ref 27.2–33.4)
MCHC: 32.3 g/dL (ref 32.0–36.0)
MCV: 97.5 fL (ref 79.3–98.0)
MONO#: 0.5 10*3/uL (ref 0.1–0.9)
MONO%: 12.3 % (ref 0.0–14.0)
NEUT#: 2.3 10*3/uL (ref 1.5–6.5)
NEUT%: 60.5 % (ref 39.0–75.0)
PLATELETS: 84 10*3/uL — AB (ref 140–400)
RBC: 2 10*6/uL — AB (ref 4.20–5.82)
RDW: 24.4 % — ABNORMAL HIGH (ref 11.0–14.6)
WBC: 3.8 10*3/uL — ABNORMAL LOW (ref 4.0–10.3)
lymph#: 0.9 10*3/uL (ref 0.9–3.3)
nRBC: 0 % (ref 0–0)

## 2015-06-08 LAB — PREPARE RBC (CROSSMATCH)

## 2015-06-08 LAB — TECHNOLOGIST REVIEW

## 2015-06-08 LAB — HOLD TUBE, BLOOD BANK

## 2015-06-08 MED ORDER — DIPHENHYDRAMINE HCL 25 MG PO CAPS
ORAL_CAPSULE | ORAL | Status: AC
  Filled 2015-06-08: qty 1

## 2015-06-08 MED ORDER — ACETAMINOPHEN 325 MG PO TABS
ORAL_TABLET | ORAL | Status: AC
  Filled 2015-06-08: qty 1

## 2015-06-08 MED ORDER — SODIUM CHLORIDE 0.9 % IV SOLN
250.0000 mL | Freq: Once | INTRAVENOUS | Status: AC
  Administered 2015-06-08: 250 mL via INTRAVENOUS

## 2015-06-08 MED ORDER — ACETAMINOPHEN 325 MG PO TABS
ORAL_TABLET | ORAL | Status: AC
  Filled 2015-06-08: qty 2

## 2015-06-08 MED ORDER — ACETAMINOPHEN 325 MG PO TABS
650.0000 mg | ORAL_TABLET | Freq: Once | ORAL | Status: AC
  Administered 2015-06-08: 650 mg via ORAL

## 2015-06-08 MED ORDER — DIPHENHYDRAMINE HCL 25 MG PO CAPS
25.0000 mg | ORAL_CAPSULE | Freq: Once | ORAL | Status: AC
  Administered 2015-06-08: 25 mg via ORAL

## 2015-06-08 NOTE — Patient Instructions (Signed)

## 2015-06-09 LAB — TYPE AND SCREEN
ABO/RH(D): A POS
ANTIBODY SCREEN: NEGATIVE
Unit division: 0
Unit division: 0

## 2015-06-17 ENCOUNTER — Ambulatory Visit: Payer: Medicare Other

## 2015-06-17 ENCOUNTER — Other Ambulatory Visit: Payer: Medicare Other

## 2015-06-17 ENCOUNTER — Telehealth: Payer: Self-pay | Admitting: Hematology and Oncology

## 2015-06-17 ENCOUNTER — Telehealth: Payer: Self-pay

## 2015-06-17 ENCOUNTER — Telehealth: Payer: Self-pay | Admitting: *Deleted

## 2015-06-17 NOTE — Telephone Encounter (Signed)
Pls proceed to cancel the appt as per patient Blood transfusion typically lasts only 2-3 weeks. He may need to come in again next week for lab check.  Please ask son if he can be brought in for lab check and place POF along with 3 hours possible transfusion

## 2015-06-17 NOTE — Telephone Encounter (Signed)
Spoke with dtr re lab/blood 12/12. Other appointments remain the same.

## 2015-06-17 NOTE — Telephone Encounter (Signed)
LVM for son to see if we can get pt scheduled next week for lab and possible transfusion since he is bleeding again he may need to get another transfusion soon.

## 2015-06-17 NOTE — Telephone Encounter (Signed)
Son called to state pt is not coming in for lab and aranesp today. He has had diarrhea all night and rectal bleeding. Son says Dr Alvy Bimler is familiar with the situation. Explained to watch for s/s of low Hgb. Pt and son did not really want to go to ER b/c they have not been helpful in the past. Pt did have transfusion 11/29. Pt did see hemorrhoid MD on Tuesday

## 2015-06-17 NOTE — Telephone Encounter (Signed)
Son called back and explained per note below. Son said any day but Thursday would be OK for an appt. Cameo informed for POF.

## 2015-06-21 ENCOUNTER — Other Ambulatory Visit (HOSPITAL_BASED_OUTPATIENT_CLINIC_OR_DEPARTMENT_OTHER): Payer: Medicare Other

## 2015-06-21 ENCOUNTER — Ambulatory Visit (HOSPITAL_BASED_OUTPATIENT_CLINIC_OR_DEPARTMENT_OTHER): Payer: Medicare Other

## 2015-06-21 VITALS — BP 139/52 | HR 67 | Temp 97.9°F | Resp 18

## 2015-06-21 DIAGNOSIS — D631 Anemia in chronic kidney disease: Secondary | ICD-10-CM

## 2015-06-21 DIAGNOSIS — D630 Anemia in neoplastic disease: Secondary | ICD-10-CM

## 2015-06-21 DIAGNOSIS — N183 Chronic kidney disease, stage 3 (moderate): Secondary | ICD-10-CM | POA: Diagnosis not present

## 2015-06-21 DIAGNOSIS — D469 Myelodysplastic syndrome, unspecified: Secondary | ICD-10-CM

## 2015-06-21 LAB — CBC WITH DIFFERENTIAL/PLATELET
BASO%: 0.8 % (ref 0.0–2.0)
BASOS ABS: 0.1 10*3/uL (ref 0.0–0.1)
EOS%: 2.4 % (ref 0.0–7.0)
Eosinophils Absolute: 0.2 10*3/uL (ref 0.0–0.5)
HCT: 27.7 % — ABNORMAL LOW (ref 38.4–49.9)
HEMOGLOBIN: 8.5 g/dL — AB (ref 13.0–17.1)
LYMPH%: 14.9 % (ref 14.0–49.0)
MCH: 30.9 pg (ref 27.2–33.4)
MCHC: 30.8 g/dL — AB (ref 32.0–36.0)
MCV: 100.6 fL — ABNORMAL HIGH (ref 79.3–98.0)
MONO#: 0.5 10*3/uL (ref 0.1–0.9)
MONO%: 8 % (ref 0.0–14.0)
NEUT#: 4.6 10*3/uL (ref 1.5–6.5)
NEUT%: 73.9 % (ref 39.0–75.0)
Platelets: 91 10*3/uL — ABNORMAL LOW (ref 140–400)
RBC: 2.75 10*6/uL — ABNORMAL LOW (ref 4.20–5.82)
RDW: 24.1 % — AB (ref 11.0–14.6)
WBC: 6.2 10*3/uL (ref 4.0–10.3)
lymph#: 0.9 10*3/uL (ref 0.9–3.3)

## 2015-06-21 LAB — HOLD TUBE, BLOOD BANK

## 2015-06-21 MED ORDER — AMINOCAPROIC ACID 500 MG PO TABS: 500.0000 mg | ORAL_TABLET | Freq: Three times a day (TID) | ORAL | Status: DC

## 2015-06-21 MED ORDER — DARBEPOETIN ALFA 500 MCG/ML IJ SOSY
500.0000 ug | PREFILLED_SYRINGE | Freq: Once | INTRAMUSCULAR | Status: AC
  Administered 2015-06-21: 500 ug via SUBCUTANEOUS
  Filled 2015-06-21: qty 1

## 2015-07-01 ENCOUNTER — Other Ambulatory Visit (HOSPITAL_BASED_OUTPATIENT_CLINIC_OR_DEPARTMENT_OTHER): Payer: Medicare Other

## 2015-07-01 ENCOUNTER — Ambulatory Visit (HOSPITAL_BASED_OUTPATIENT_CLINIC_OR_DEPARTMENT_OTHER): Payer: Medicare Other

## 2015-07-01 VITALS — BP 105/42 | HR 72 | Temp 97.9°F

## 2015-07-01 DIAGNOSIS — D631 Anemia in chronic kidney disease: Secondary | ICD-10-CM

## 2015-07-01 DIAGNOSIS — D469 Myelodysplastic syndrome, unspecified: Secondary | ICD-10-CM

## 2015-07-01 DIAGNOSIS — D630 Anemia in neoplastic disease: Secondary | ICD-10-CM

## 2015-07-01 DIAGNOSIS — N183 Chronic kidney disease, stage 3 (moderate): Secondary | ICD-10-CM

## 2015-07-01 LAB — CBC WITH DIFFERENTIAL/PLATELET
BASO%: 1.3 % (ref 0.0–2.0)
BASOS ABS: 0.1 10*3/uL (ref 0.0–0.1)
EOS ABS: 0.1 10*3/uL (ref 0.0–0.5)
EOS%: 1.9 % (ref 0.0–7.0)
HEMATOCRIT: 27.6 % — AB (ref 38.4–49.9)
HEMOGLOBIN: 8.6 g/dL — AB (ref 13.0–17.1)
LYMPH#: 0.9 10*3/uL (ref 0.9–3.3)
LYMPH%: 14.7 % (ref 14.0–49.0)
MCH: 31.1 pg (ref 27.2–33.4)
MCHC: 31 g/dL — AB (ref 32.0–36.0)
MCV: 100.1 fL — AB (ref 79.3–98.0)
MONO#: 0.5 10*3/uL (ref 0.1–0.9)
MONO%: 7.8 % (ref 0.0–14.0)
NEUT#: 4.4 10*3/uL (ref 1.5–6.5)
NEUT%: 74.3 % (ref 39.0–75.0)
Platelets: 91 10*3/uL — ABNORMAL LOW (ref 140–400)
RBC: 2.76 10*6/uL — AB (ref 4.20–5.82)
RDW: 24.9 % — AB (ref 11.0–14.6)
WBC: 5.9 10*3/uL (ref 4.0–10.3)

## 2015-07-01 LAB — HOLD TUBE, BLOOD BANK

## 2015-07-01 MED ORDER — DARBEPOETIN ALFA 500 MCG/ML IJ SOSY
500.0000 ug | PREFILLED_SYRINGE | Freq: Once | INTRAMUSCULAR | Status: AC
  Administered 2015-07-01: 500 ug via SUBCUTANEOUS
  Filled 2015-07-01: qty 1

## 2015-07-15 ENCOUNTER — Other Ambulatory Visit (HOSPITAL_BASED_OUTPATIENT_CLINIC_OR_DEPARTMENT_OTHER): Payer: Medicare Other

## 2015-07-15 ENCOUNTER — Ambulatory Visit (HOSPITAL_BASED_OUTPATIENT_CLINIC_OR_DEPARTMENT_OTHER): Payer: Medicare Other

## 2015-07-15 VITALS — BP 113/43 | HR 60 | Temp 97.9°F

## 2015-07-15 DIAGNOSIS — N183 Chronic kidney disease, stage 3 (moderate): Secondary | ICD-10-CM

## 2015-07-15 DIAGNOSIS — D469 Myelodysplastic syndrome, unspecified: Secondary | ICD-10-CM

## 2015-07-15 DIAGNOSIS — D631 Anemia in chronic kidney disease: Secondary | ICD-10-CM

## 2015-07-15 DIAGNOSIS — D630 Anemia in neoplastic disease: Secondary | ICD-10-CM

## 2015-07-15 LAB — CBC WITH DIFFERENTIAL/PLATELET
BASO%: 0.7 % (ref 0.0–2.0)
BASOS ABS: 0.1 10*3/uL (ref 0.0–0.1)
EOS ABS: 0.2 10*3/uL (ref 0.0–0.5)
EOS%: 3 % (ref 0.0–7.0)
HCT: 29.4 % — ABNORMAL LOW (ref 38.4–49.9)
HEMOGLOBIN: 9 g/dL — AB (ref 13.0–17.1)
LYMPH%: 18.6 % (ref 14.0–49.0)
MCH: 30.2 pg (ref 27.2–33.4)
MCHC: 30.6 g/dL — ABNORMAL LOW (ref 32.0–36.0)
MCV: 98.5 fL — AB (ref 79.3–98.0)
MONO#: 0.6 10*3/uL (ref 0.1–0.9)
MONO%: 8.3 % (ref 0.0–14.0)
NEUT%: 69.4 % (ref 39.0–75.0)
NEUTROS ABS: 5 10*3/uL (ref 1.5–6.5)
PLATELETS: 82 10*3/uL — AB (ref 140–400)
RBC: 2.99 10*6/uL — ABNORMAL LOW (ref 4.20–5.82)
RDW: 27.4 % — AB (ref 11.0–14.6)
WBC: 7.2 10*3/uL (ref 4.0–10.3)
lymph#: 1.3 10*3/uL (ref 0.9–3.3)

## 2015-07-15 MED ORDER — DARBEPOETIN ALFA 500 MCG/ML IJ SOSY
500.0000 ug | PREFILLED_SYRINGE | Freq: Once | INTRAMUSCULAR | Status: AC
  Administered 2015-07-15: 500 ug via SUBCUTANEOUS
  Filled 2015-07-15: qty 1

## 2015-07-27 ENCOUNTER — Ambulatory Visit (INDEPENDENT_AMBULATORY_CARE_PROVIDER_SITE_OTHER): Payer: Medicare Other | Admitting: Podiatry

## 2015-07-27 ENCOUNTER — Encounter: Payer: Self-pay | Admitting: Podiatry

## 2015-07-27 DIAGNOSIS — B351 Tinea unguium: Secondary | ICD-10-CM | POA: Diagnosis not present

## 2015-07-27 DIAGNOSIS — M79673 Pain in unspecified foot: Secondary | ICD-10-CM | POA: Diagnosis not present

## 2015-07-27 DIAGNOSIS — G609 Hereditary and idiopathic neuropathy, unspecified: Secondary | ICD-10-CM

## 2015-07-27 NOTE — Progress Notes (Signed)
Patient ID: Anthony Hutchinson, male   DOB: Aug 31, 1921, 82 y.o.   MRN: [id number]  Subjective: This patient presents for scheduled visit complaining of thickened and elongated toenails and walking wearing shoes and requests nail debridement Patient's daughter present treatment room  Objective: Orientated 3 Toenails are brittle, discolored, elongated, deformed 6-10  Assessment: Mycotic toenails 6-10 Idiopathic peripheral neuropathy  Plan: Debridement toenails 6-10 mechanically and electrically. Small bleeding distal fourth right toe treated with antibiotic ointment and Band-Aid. Patient and daughter advised removed Band-Aid 1-2 days and to continue applying topical antibiotic ointment daily until a scab forms

## 2015-07-27 NOTE — Patient Instructions (Signed)
Removed Band-Aid on fourth right toe 1-2 days and continue to apply topical antibiotic ointment daily until a scab forms

## 2015-07-29 ENCOUNTER — Other Ambulatory Visit (HOSPITAL_BASED_OUTPATIENT_CLINIC_OR_DEPARTMENT_OTHER): Payer: Medicare Other

## 2015-07-29 ENCOUNTER — Ambulatory Visit (HOSPITAL_BASED_OUTPATIENT_CLINIC_OR_DEPARTMENT_OTHER): Payer: Medicare Other

## 2015-07-29 VITALS — BP 127/63 | HR 64 | Temp 97.7°F

## 2015-07-29 DIAGNOSIS — N183 Chronic kidney disease, stage 3 (moderate): Secondary | ICD-10-CM

## 2015-07-29 DIAGNOSIS — D469 Myelodysplastic syndrome, unspecified: Secondary | ICD-10-CM

## 2015-07-29 DIAGNOSIS — D630 Anemia in neoplastic disease: Secondary | ICD-10-CM

## 2015-07-29 DIAGNOSIS — D631 Anemia in chronic kidney disease: Secondary | ICD-10-CM | POA: Diagnosis not present

## 2015-07-29 LAB — CBC WITH DIFFERENTIAL/PLATELET
BASO%: 1 % (ref 0.0–2.0)
BASOS ABS: 0.1 10*3/uL (ref 0.0–0.1)
EOS%: 2.8 % (ref 0.0–7.0)
Eosinophils Absolute: 0.2 10*3/uL (ref 0.0–0.5)
HEMATOCRIT: 26.9 % — AB (ref 38.4–49.9)
HEMOGLOBIN: 8.3 g/dL — AB (ref 13.0–17.1)
LYMPH#: 1.1 10*3/uL (ref 0.9–3.3)
LYMPH%: 19.7 % (ref 14.0–49.0)
MCH: 30.9 pg (ref 27.2–33.4)
MCHC: 30.9 g/dL — ABNORMAL LOW (ref 32.0–36.0)
MCV: 100.2 fL — ABNORMAL HIGH (ref 79.3–98.0)
MONO#: 0.5 10*3/uL (ref 0.1–0.9)
MONO%: 9.3 % (ref 0.0–14.0)
NEUT#: 3.7 10*3/uL (ref 1.5–6.5)
NEUT%: 67.2 % (ref 39.0–75.0)
PLATELETS: 75 10*3/uL — AB (ref 140–400)
RBC: 2.69 10*6/uL — ABNORMAL LOW (ref 4.20–5.82)
RDW: 28.2 % — AB (ref 11.0–14.6)
WBC: 5.6 10*3/uL (ref 4.0–10.3)

## 2015-07-29 MED ORDER — DARBEPOETIN ALFA 500 MCG/ML IJ SOSY
500.0000 ug | PREFILLED_SYRINGE | Freq: Once | INTRAMUSCULAR | Status: AC
  Administered 2015-07-29: 500 ug via SUBCUTANEOUS
  Filled 2015-07-29: qty 1

## 2015-08-12 ENCOUNTER — Ambulatory Visit: Payer: Medicare Other

## 2015-08-12 ENCOUNTER — Other Ambulatory Visit: Payer: Medicare Other

## 2015-08-16 ENCOUNTER — Ambulatory Visit (HOSPITAL_BASED_OUTPATIENT_CLINIC_OR_DEPARTMENT_OTHER): Payer: Medicare Other

## 2015-08-16 ENCOUNTER — Other Ambulatory Visit (HOSPITAL_BASED_OUTPATIENT_CLINIC_OR_DEPARTMENT_OTHER): Payer: Medicare Other

## 2015-08-16 ENCOUNTER — Other Ambulatory Visit: Payer: Self-pay | Admitting: Hematology and Oncology

## 2015-08-16 ENCOUNTER — Ambulatory Visit (HOSPITAL_COMMUNITY)
Admission: RE | Admit: 2015-08-16 | Discharge: 2015-08-16 | Disposition: A | Discharge status: Home / Self Care | Payer: Medicare Other | Source: Ambulatory Visit | Attending: Hematology and Oncology | Admitting: Hematology and Oncology

## 2015-08-16 ENCOUNTER — Telehealth: Payer: Self-pay | Admitting: Hematology and Oncology

## 2015-08-16 VITALS — BP 115/59 | HR 68 | Temp 97.8°F

## 2015-08-16 DIAGNOSIS — N183 Chronic kidney disease, stage 3 (moderate): Secondary | ICD-10-CM

## 2015-08-16 DIAGNOSIS — D630 Anemia in neoplastic disease: Secondary | ICD-10-CM

## 2015-08-16 DIAGNOSIS — D631 Anemia in chronic kidney disease: Secondary | ICD-10-CM

## 2015-08-16 DIAGNOSIS — D469 Myelodysplastic syndrome, unspecified: Secondary | ICD-10-CM | POA: Diagnosis not present

## 2015-08-16 LAB — CBC WITH DIFFERENTIAL/PLATELET
BASO%: 0.9 % (ref 0.0–2.0)
BASOS ABS: 0 10*3/uL (ref 0.0–0.1)
EOS ABS: 0.1 10*3/uL (ref 0.0–0.5)
EOS%: 2.1 % (ref 0.0–7.0)
HCT: 21.8 % — ABNORMAL LOW (ref 38.4–49.9)
HGB: 6.6 g/dL — CL (ref 13.0–17.1)
LYMPH%: 21.8 % (ref 14.0–49.0)
MCH: 30.8 pg (ref 27.2–33.4)
MCHC: 30.3 g/dL — ABNORMAL LOW (ref 32.0–36.0)
MCV: 101.5 fL — AB (ref 79.3–98.0)
MONO#: 0.4 10*3/uL (ref 0.1–0.9)
MONO%: 8.9 % (ref 0.0–14.0)
NEUT#: 3.1 10*3/uL (ref 1.5–6.5)
NEUT%: 66.3 % (ref 39.0–75.0)
Platelets: 63 10*3/uL — ABNORMAL LOW (ref 140–400)
RBC: 2.15 10*6/uL — AB (ref 4.20–5.82)
RDW: 27.4 % — ABNORMAL HIGH (ref 11.0–14.6)
WBC: 4.7 10*3/uL (ref 4.0–10.3)
lymph#: 1 10*3/uL (ref 0.9–3.3)

## 2015-08-16 MED ORDER — DARBEPOETIN ALFA 500 MCG/ML IJ SOSY
500.0000 ug | PREFILLED_SYRINGE | Freq: Once | INTRAMUSCULAR | Status: AC
  Administered 2015-08-16: 500 ug via SUBCUTANEOUS
  Filled 2015-08-16: qty 1

## 2015-08-16 NOTE — Telephone Encounter (Signed)
Pt's son called to r/s last weeks apt due to pt was sick, [name] confirmed labs/inj with new D/T .Marland Kitchen.. KJ

## 2015-08-17 ENCOUNTER — Ambulatory Visit (HOSPITAL_COMMUNITY)
Admission: RE | Admit: 2015-08-17 | Discharge: 2015-08-17 | Disposition: A | Discharge status: Home / Self Care | Payer: Medicare Other | Source: Ambulatory Visit | Attending: Hematology and Oncology | Admitting: Hematology and Oncology

## 2015-08-17 VITALS — BP 144/56 | HR 79 | Temp 97.8°F | Resp 20

## 2015-08-17 DIAGNOSIS — D469 Myelodysplastic syndrome, unspecified: Secondary | ICD-10-CM | POA: Diagnosis not present

## 2015-08-17 LAB — PREPARE RBC (CROSSMATCH)

## 2015-08-17 MED ORDER — DIPHENHYDRAMINE HCL 25 MG PO CAPS
25.0000 mg | ORAL_CAPSULE | Freq: Once | ORAL | Status: AC
  Administered 2015-08-17: 25 mg via ORAL
  Filled 2015-08-17: qty 1

## 2015-08-17 MED ORDER — ACETAMINOPHEN 325 MG PO TABS
650.0000 mg | ORAL_TABLET | Freq: Once | ORAL | Status: AC
  Administered 2015-08-17: 650 mg via ORAL
  Filled 2015-08-17: qty 2

## 2015-08-17 MED ORDER — SODIUM CHLORIDE 0.9 % IV SOLN
250.0000 mL | Freq: Once | INTRAVENOUS | Status: AC
  Administered 2015-08-17: 250 mL via INTRAVENOUS

## 2015-08-17 MED ORDER — SODIUM CHLORIDE 0.9% FLUSH: 10.0000 mL | INTRAVENOUS | Status: DC | PRN

## 2015-08-17 MED ORDER — SODIUM CHLORIDE 0.9% FLUSH: 3.0000 mL | INTRAVENOUS | Status: DC | PRN

## 2015-08-17 MED ORDER — HEPARIN SOD (PORK) LOCK FLUSH 100 UNIT/ML IV SOLN: 500.0000 [IU] | Freq: Every day | INTRAVENOUS | Status: DC | PRN

## 2015-08-17 MED ORDER — HEPARIN SOD (PORK) LOCK FLUSH 100 UNIT/ML IV SOLN: 250.0000 [IU] | INTRAVENOUS | Status: DC | PRN

## 2015-08-17 NOTE — Progress Notes (Signed)
Provider: Dr Alvy Bimler Diagnosis: ICD-9-CM: 238.75 ICD-10-CM: D46.9 Procedure: Patient received 2 units of PRBC through peripheral IV inserted by writer.  Patient ambulatory with rolling walker at discharge accompanied by his son.  No distress noted.

## 2015-08-18 LAB — TYPE AND SCREEN
ABO/RH(D): A POS
Antibody Screen: NEGATIVE
Unit division: 0
Unit division: 0

## 2015-08-19 ENCOUNTER — Ambulatory Visit (INDEPENDENT_AMBULATORY_CARE_PROVIDER_SITE_OTHER): Payer: Medicare Other | Admitting: *Deleted

## 2015-08-19 DIAGNOSIS — I442 Atrioventricular block, complete: Secondary | ICD-10-CM | POA: Diagnosis not present

## 2015-08-19 NOTE — Progress Notes (Signed)
Remote pacemaker transmission.   

## 2015-08-26 ENCOUNTER — Other Ambulatory Visit (HOSPITAL_BASED_OUTPATIENT_CLINIC_OR_DEPARTMENT_OTHER): Payer: Medicare Other

## 2015-08-26 ENCOUNTER — Ambulatory Visit (HOSPITAL_BASED_OUTPATIENT_CLINIC_OR_DEPARTMENT_OTHER): Payer: Medicare Other

## 2015-08-26 VITALS — BP 118/52 | HR 62 | Temp 97.5°F

## 2015-08-26 DIAGNOSIS — D630 Anemia in neoplastic disease: Secondary | ICD-10-CM

## 2015-08-26 DIAGNOSIS — D631 Anemia in chronic kidney disease: Secondary | ICD-10-CM

## 2015-08-26 DIAGNOSIS — N183 Chronic kidney disease, stage 3 (moderate): Secondary | ICD-10-CM

## 2015-08-26 DIAGNOSIS — D469 Myelodysplastic syndrome, unspecified: Secondary | ICD-10-CM

## 2015-08-26 LAB — CBC WITH DIFFERENTIAL/PLATELET
BASO%: 0.6 % (ref 0.0–2.0)
Basophils Absolute: 0 10*3/uL (ref 0.0–0.1)
EOS%: 3.2 % (ref 0.0–7.0)
Eosinophils Absolute: 0.2 10*3/uL (ref 0.0–0.5)
HEMATOCRIT: 26.5 % — AB (ref 38.4–49.9)
HEMOGLOBIN: 8.4 g/dL — AB (ref 13.0–17.1)
LYMPH#: 1 10*3/uL (ref 0.9–3.3)
LYMPH%: 16.4 % (ref 14.0–49.0)
MCH: 31 pg (ref 27.2–33.4)
MCHC: 31.7 g/dL — ABNORMAL LOW (ref 32.0–36.0)
MCV: 97.7 fL (ref 79.3–98.0)
MONO#: 0.5 10*3/uL (ref 0.1–0.9)
MONO%: 8.7 % (ref 0.0–14.0)
NEUT%: 71.1 % (ref 39.0–75.0)
NEUTROS ABS: 4.4 10*3/uL (ref 1.5–6.5)
PLATELETS: 76 10*3/uL — AB (ref 140–400)
RBC: 2.72 10*6/uL — AB (ref 4.20–5.82)
RDW: 26 % — AB (ref 11.0–14.6)
WBC: 6.2 10*3/uL (ref 4.0–10.3)

## 2015-08-26 MED ORDER — DARBEPOETIN ALFA 500 MCG/ML IJ SOSY
500.0000 ug | PREFILLED_SYRINGE | Freq: Once | INTRAMUSCULAR | Status: AC
  Administered 2015-08-26: 500 ug via SUBCUTANEOUS
  Filled 2015-08-26: qty 1

## 2015-08-26 MED ORDER — AMINOCAPROIC ACID 500 MG PO TABS: 500.0000 mg | ORAL_TABLET | Freq: Three times a day (TID) | ORAL | Status: DC | PRN

## 2015-08-26 MED FILL — AMICAR 500 MG TABLET: 500 | 10 days supply | Qty: 30 | Fill #0

## 2015-09-09 ENCOUNTER — Other Ambulatory Visit (HOSPITAL_BASED_OUTPATIENT_CLINIC_OR_DEPARTMENT_OTHER): Payer: Medicare Other

## 2015-09-09 ENCOUNTER — Ambulatory Visit (HOSPITAL_BASED_OUTPATIENT_CLINIC_OR_DEPARTMENT_OTHER): Payer: Medicare Other

## 2015-09-09 VITALS — BP 121/52 | HR 67 | Temp 97.8°F

## 2015-09-09 DIAGNOSIS — D631 Anemia in chronic kidney disease: Secondary | ICD-10-CM | POA: Diagnosis not present

## 2015-09-09 DIAGNOSIS — D469 Myelodysplastic syndrome, unspecified: Secondary | ICD-10-CM

## 2015-09-09 DIAGNOSIS — D630 Anemia in neoplastic disease: Secondary | ICD-10-CM

## 2015-09-09 DIAGNOSIS — N189 Chronic kidney disease, unspecified: Secondary | ICD-10-CM

## 2015-09-09 DIAGNOSIS — N183 Chronic kidney disease, stage 3 (moderate): Secondary | ICD-10-CM

## 2015-09-09 LAB — CBC WITH DIFFERENTIAL/PLATELET
BASO%: 0.7 % (ref 0.0–2.0)
Basophils Absolute: 0 10*3/uL (ref 0.0–0.1)
EOS%: 2.2 % (ref 0.0–7.0)
Eosinophils Absolute: 0.1 10*3/uL (ref 0.0–0.5)
HEMATOCRIT: 25.5 % — AB (ref 38.4–49.9)
HEMOGLOBIN: 8 g/dL — AB (ref 13.0–17.1)
LYMPH#: 0.8 10*3/uL — AB (ref 0.9–3.3)
LYMPH%: 16.3 % (ref 14.0–49.0)
MCH: 30.8 pg (ref 27.2–33.4)
MCHC: 31.6 g/dL — ABNORMAL LOW (ref 32.0–36.0)
MCV: 97.7 fL (ref 79.3–98.0)
MONO#: 0.4 10*3/uL (ref 0.1–0.9)
MONO%: 8.5 % (ref 0.0–14.0)
NEUT%: 72.3 % (ref 39.0–75.0)
NEUTROS ABS: 3.5 10*3/uL (ref 1.5–6.5)
PLATELETS: 72 10*3/uL — AB (ref 140–400)
RBC: 2.61 10*6/uL — AB (ref 4.20–5.82)
RDW: 27.1 % — AB (ref 11.0–14.6)
WBC: 4.8 10*3/uL (ref 4.0–10.3)

## 2015-09-09 MED ORDER — DARBEPOETIN ALFA 500 MCG/ML IJ SOSY
500.0000 ug | PREFILLED_SYRINGE | Freq: Once | INTRAMUSCULAR | Status: AC
  Administered 2015-09-09: 500 ug via SUBCUTANEOUS
  Filled 2015-09-09: qty 1

## 2015-09-22 ENCOUNTER — Encounter: Payer: Self-pay | Admitting: Cardiology

## 2015-09-22 LAB — CUP PACEART REMOTE DEVICE CHECK
Battery Remaining Percentage: 95.5 %
Battery Voltage: 3.01 V
Brady Statistic AP VP Percent: 46 %
Brady Statistic AP VS Percent: 1 %
Brady Statistic AS VP Percent: 54 %
Brady Statistic RV Percent Paced: 99 %
Date Time Interrogation Session: 20170207070012
Implantable Lead Implant Date: 20150427
Lead Channel Impedance Value: 400 Ohm
Lead Channel Pacing Threshold Amplitude: 0.875 V
Lead Channel Sensing Intrinsic Amplitude: 2.7 mV
Lead Channel Sensing Intrinsic Amplitude: 7.8 mV
Lead Channel Setting Pacing Amplitude: 1.625
Lead Channel Setting Pacing Pulse Width: 0.4 ms
Lead Channel Setting Sensing Sensitivity: 4 mV
MDC IDC LEAD IMPLANT DT: 20150427
MDC IDC LEAD LOCATION: 753859
MDC IDC LEAD LOCATION: 753860
MDC IDC MSMT BATTERY REMAINING LONGEVITY: 123 mo
MDC IDC MSMT LEADCHNL RA IMPEDANCE VALUE: 400 Ohm
MDC IDC MSMT LEADCHNL RA PACING THRESHOLD AMPLITUDE: 0.625 V
MDC IDC MSMT LEADCHNL RA PACING THRESHOLD PULSEWIDTH: 0.4 ms
MDC IDC MSMT LEADCHNL RV PACING THRESHOLD PULSEWIDTH: 0.4 ms
MDC IDC PG SERIAL: 3013631
MDC IDC SET LEADCHNL RV PACING AMPLITUDE: 1.125
MDC IDC STAT BRADY AS VS PERCENT: 1 %
MDC IDC STAT BRADY RA PERCENT PACED: 44 %

## 2015-09-23 ENCOUNTER — Ambulatory Visit (HOSPITAL_BASED_OUTPATIENT_CLINIC_OR_DEPARTMENT_OTHER): Payer: Medicare Other

## 2015-09-23 ENCOUNTER — Other Ambulatory Visit (HOSPITAL_BASED_OUTPATIENT_CLINIC_OR_DEPARTMENT_OTHER): Payer: Medicare Other

## 2015-09-23 VITALS — BP 141/54 | HR 59 | Temp 98.1°F

## 2015-09-23 DIAGNOSIS — N183 Chronic kidney disease, stage 3 (moderate): Secondary | ICD-10-CM

## 2015-09-23 DIAGNOSIS — D631 Anemia in chronic kidney disease: Secondary | ICD-10-CM

## 2015-09-23 DIAGNOSIS — D469 Myelodysplastic syndrome, unspecified: Secondary | ICD-10-CM

## 2015-09-23 DIAGNOSIS — D630 Anemia in neoplastic disease: Secondary | ICD-10-CM

## 2015-09-23 LAB — CBC WITH DIFFERENTIAL/PLATELET
BASO%: 0.9 % (ref 0.0–2.0)
Basophils Absolute: 0 10*3/uL (ref 0.0–0.1)
EOS ABS: 0.2 10*3/uL (ref 0.0–0.5)
EOS%: 3.7 % (ref 0.0–7.0)
HCT: 25.8 % — ABNORMAL LOW (ref 38.4–49.9)
HEMOGLOBIN: 7.9 g/dL — AB (ref 13.0–17.1)
LYMPH%: 19.1 % (ref 14.0–49.0)
MCH: 30 pg (ref 27.2–33.4)
MCHC: 30.6 g/dL — ABNORMAL LOW (ref 32.0–36.0)
MCV: 98.1 fL — AB (ref 79.3–98.0)
MONO#: 0.4 10*3/uL (ref 0.1–0.9)
MONO%: 8.7 % (ref 0.0–14.0)
NEUT%: 67.6 % (ref 39.0–75.0)
NEUTROS ABS: 3 10*3/uL (ref 1.5–6.5)
Platelets: 68 10*3/uL — ABNORMAL LOW (ref 140–400)
RBC: 2.63 10*6/uL — AB (ref 4.20–5.82)
RDW: 28 % — AB (ref 11.0–14.6)
WBC: 4.5 10*3/uL (ref 4.0–10.3)
lymph#: 0.9 10*3/uL (ref 0.9–3.3)

## 2015-09-23 MED ORDER — DARBEPOETIN ALFA 500 MCG/ML IJ SOSY
500.0000 ug | PREFILLED_SYRINGE | Freq: Once | INTRAMUSCULAR | Status: AC
  Administered 2015-09-23: 500 ug via SUBCUTANEOUS
  Filled 2015-09-23: qty 1

## 2015-10-07 ENCOUNTER — Other Ambulatory Visit: Payer: Self-pay | Admitting: Hematology and Oncology

## 2015-10-07 ENCOUNTER — Ambulatory Visit (HOSPITAL_BASED_OUTPATIENT_CLINIC_OR_DEPARTMENT_OTHER): Payer: Medicare Other

## 2015-10-07 ENCOUNTER — Other Ambulatory Visit (HOSPITAL_BASED_OUTPATIENT_CLINIC_OR_DEPARTMENT_OTHER): Payer: Medicare Other

## 2015-10-07 VITALS — BP 134/51 | HR 60 | Temp 97.8°F | Resp 22

## 2015-10-07 DIAGNOSIS — N183 Chronic kidney disease, stage 3 (moderate): Secondary | ICD-10-CM

## 2015-10-07 DIAGNOSIS — D469 Myelodysplastic syndrome, unspecified: Secondary | ICD-10-CM

## 2015-10-07 DIAGNOSIS — D631 Anemia in chronic kidney disease: Secondary | ICD-10-CM | POA: Diagnosis not present

## 2015-10-07 DIAGNOSIS — D630 Anemia in neoplastic disease: Secondary | ICD-10-CM

## 2015-10-07 LAB — CBC & DIFF AND RETIC
BASO%: 0.2 % (ref 0.0–2.0)
BASOS ABS: 0 10*3/uL (ref 0.0–0.1)
EOS%: 2.8 % (ref 0.0–7.0)
Eosinophils Absolute: 0.1 10*3/uL (ref 0.0–0.5)
HEMATOCRIT: 24.1 % — AB (ref 38.4–49.9)
HEMOGLOBIN: 7.7 g/dL — AB (ref 13.0–17.1)
Immature Retic Fract: 9.7 % (ref 3.00–10.60)
LYMPH#: 1 10*3/uL (ref 0.9–3.3)
LYMPH%: 22.8 % (ref 14.0–49.0)
MCH: 30 pg (ref 27.2–33.4)
MCHC: 32 g/dL (ref 32.0–36.0)
MCV: 93.8 fL (ref 79.3–98.0)
MONO#: 0.3 10*3/uL (ref 0.1–0.9)
MONO%: 6.8 % (ref 0.0–14.0)
NEUT#: 2.9 10*3/uL (ref 1.5–6.5)
NEUT%: 67.4 % (ref 39.0–75.0)
NRBC: 0 % (ref 0–0)
Platelets: 56 10*3/uL — ABNORMAL LOW (ref 140–400)
RBC: 2.57 10*6/uL — ABNORMAL LOW (ref 4.20–5.82)
RDW: 24.7 % — AB (ref 11.0–14.6)
RETIC %: 2.16 % — AB (ref 0.80–1.80)
RETIC CT ABS: 55.51 10*3/uL (ref 34.80–93.90)
WBC: 4.3 10*3/uL (ref 4.0–10.3)

## 2015-10-07 MED ORDER — DARBEPOETIN ALFA 500 MCG/ML IJ SOSY
500.0000 ug | PREFILLED_SYRINGE | Freq: Once | INTRAMUSCULAR | Status: AC
  Administered 2015-10-07: 500 ug via SUBCUTANEOUS
  Filled 2015-10-07: qty 1

## 2015-10-07 NOTE — Patient Instructions (Signed)
Darbepoetin Alfa injection What is this medicine? DARBEPOETIN ALFA (dar be POE e tin AL fa) helps your body make more red blood cells. It is used to treat anemia caused by chronic kidney failure and chemotherapy. This medicine may be used for other purposes; ask your health care provider or pharmacist if you have questions. COMMON BRAND NAME(S): Aranesp What should I tell my health care provider before I take this medicine? They need to know if you have any of these conditions: -blood clotting disorders or history of blood clots -cancer patient not on chemotherapy -cystic fibrosis -heart disease, such as angina, heart failure, or a history of a heart attack -hemoglobin level of 12 g/dL or greater -high blood pressure -low levels of folate, iron, or vitamin B12 -seizures -an unusual or allergic reaction to darbepoetin, erythropoietin, albumin, hamster proteins, latex, other medicines, foods, dyes, or preservatives -pregnant or trying to get pregnant -breast-feeding How should I use this medicine? This medicine is for injection into a vein or under the skin. It is usually given by a health care professional in a hospital or clinic setting. If you get this medicine at home, you will be taught how to prepare and give this medicine. Do not shake the solution before you withdraw a dose. Use exactly as directed. Take your medicine at regular intervals. Do not take your medicine more often than directed. It is important that you put your used needles and syringes in a special sharps container. Do not put them in a trash can. If you do not have a sharps container, call your pharmacist or healthcare provider to get one. Talk to your pediatrician regarding the use of this medicine in children. While this medicine may be used in children as young as 1 year for selected conditions, precautions do apply. Overdosage: If you think you have taken too much of this medicine contact a poison control center or  emergency room at once. NOTE: This medicine is only for you. Do not share this medicine with others. What if I miss a dose? If you miss a dose, take it as soon as you can. If it is almost time for your next dose, take only that dose. Do not take double or extra doses. What may interact with this medicine? Do not take this medicine with any of the following medications: -epoetin alfa This list may not describe all possible interactions. Give your health care provider a list of all the medicines, herbs, non-prescription drugs, or dietary supplements you use. Also tell them if you smoke, drink alcohol, or use illegal drugs. Some items may interact with your medicine. What should I watch for while using this medicine? Visit your prescriber or health care professional for regular checks on your progress and for the needed blood tests and blood pressure measurements. It is especially important for the doctor to make sure your hemoglobin level is in the desired range, to limit the risk of potential side effects and to give you the best benefit. Keep all appointments for any recommended tests. Check your blood pressure as directed. Ask your doctor what your blood pressure should be and when you should contact him or her. As your body makes more red blood cells, you may need to take iron, folic acid, or vitamin B supplements. Ask your doctor or health care provider which products are right for you. If you have kidney disease continue dietary restrictions, even though this medication can make you feel better. Talk with your doctor or health   care professional about the foods you eat and the vitamins that you take. What side effects may I notice from receiving this medicine? Side effects that you should report to your doctor or health care professional as soon as possible: -allergic reactions like skin rash, itching or hives, swelling of the face, lips, or tongue -breathing problems -changes in vision -chest  pain -confusion, trouble speaking or understanding -feeling faint or lightheaded, falls -high blood pressure -muscle aches or pains -pain, swelling, warmth in the leg -rapid weight gain -severe headaches -sudden numbness or weakness of the face, arm or leg -trouble walking, dizziness, loss of balance or coordination -seizures (convulsions) -swelling of the ankles, feet, hands -unusually weak or tired Side effects that usually do not require medical attention (report to your doctor or health care professional if they continue or are bothersome): -diarrhea -fever, chills (flu-like symptoms) -headaches -nausea, vomiting -redness, stinging, or swelling at site where injected This list may not describe all possible side effects. Call your doctor for medical advice about side effects. You may report side effects to FDA at 1-800-FDA-1088. Where should I keep my medicine? Keep out of the reach of children. Store in a refrigerator between 2 and 8 degrees C (36 and 46 degrees F). Do not freeze. Do not shake. Throw away any unused portion if using a single-dose vial. Throw away any unused medicine after the expiration date. NOTE: This sheet is a summary. It may not cover all possible information. If you have questions about this medicine, talk to your doctor, pharmacist, or health care provider.  2015, [employer]vier/[employer] Standard. (2008-06-09 10:23:57)  

## 2015-10-21 ENCOUNTER — Ambulatory Visit (HOSPITAL_BASED_OUTPATIENT_CLINIC_OR_DEPARTMENT_OTHER): Payer: Medicare Other

## 2015-10-21 ENCOUNTER — Ambulatory Visit (HOSPITAL_COMMUNITY)
Admission: RE | Admit: 2015-10-21 | Discharge: 2015-10-21 | Disposition: A | Discharge status: Home / Self Care | Payer: Medicare Other | Source: Ambulatory Visit | Attending: Hematology and Oncology | Admitting: Hematology and Oncology

## 2015-10-21 ENCOUNTER — Other Ambulatory Visit: Payer: Self-pay | Admitting: Hematology and Oncology

## 2015-10-21 ENCOUNTER — Ambulatory Visit: Payer: Medicare Other

## 2015-10-21 ENCOUNTER — Other Ambulatory Visit (HOSPITAL_BASED_OUTPATIENT_CLINIC_OR_DEPARTMENT_OTHER): Payer: Medicare Other

## 2015-10-21 VITALS — BP 138/51 | HR 60 | Temp 96.9°F | Resp 17

## 2015-10-21 VITALS — BP 132/50 | HR 60 | Temp 97.5°F

## 2015-10-21 DIAGNOSIS — N183 Chronic kidney disease, stage 3 (moderate): Secondary | ICD-10-CM | POA: Diagnosis not present

## 2015-10-21 DIAGNOSIS — D469 Myelodysplastic syndrome, unspecified: Secondary | ICD-10-CM

## 2015-10-21 DIAGNOSIS — D630 Anemia in neoplastic disease: Secondary | ICD-10-CM

## 2015-10-21 DIAGNOSIS — D631 Anemia in chronic kidney disease: Secondary | ICD-10-CM | POA: Diagnosis not present

## 2015-10-21 LAB — CBC & DIFF AND RETIC
BASO%: 0.2 % (ref 0.0–2.0)
Basophils Absolute: 0 10*3/uL (ref 0.0–0.1)
EOS%: 2.9 % (ref 0.0–7.0)
Eosinophils Absolute: 0.1 10*3/uL (ref 0.0–0.5)
HCT: 23.3 % — ABNORMAL LOW (ref 38.4–49.9)
HGB: 7.5 g/dL — ABNORMAL LOW (ref 13.0–17.1)
IMMATURE RETIC FRACT: 15.4 % — AB (ref 3.00–10.60)
LYMPH%: 19.1 % (ref 14.0–49.0)
MCH: 30 pg (ref 27.2–33.4)
MCHC: 32.2 g/dL (ref 32.0–36.0)
MCV: 93.2 fL (ref 79.3–98.0)
MONO#: 0.4 10*3/uL (ref 0.1–0.9)
MONO%: 8.9 % (ref 0.0–14.0)
NEUT%: 68.9 % (ref 39.0–75.0)
NEUTROS ABS: 2.9 10*3/uL (ref 1.5–6.5)
PLATELETS: 60 10*3/uL — AB (ref 140–400)
RBC: 2.5 10*6/uL — AB (ref 4.20–5.82)
RDW: 25.2 % — AB (ref 11.0–14.6)
RETIC CT ABS: 80.25 10*3/uL (ref 34.80–93.90)
Retic %: 3.21 % — ABNORMAL HIGH (ref 0.80–1.80)
WBC: 4.2 10*3/uL (ref 4.0–10.3)
lymph#: 0.8 10*3/uL — ABNORMAL LOW (ref 0.9–3.3)

## 2015-10-21 LAB — PREPARE RBC (CROSSMATCH)

## 2015-10-21 LAB — TECHNOLOGIST REVIEW

## 2015-10-21 MED ORDER — DIPHENHYDRAMINE HCL 25 MG PO CAPS
25.0000 mg | ORAL_CAPSULE | Freq: Once | ORAL | Status: AC
  Administered 2015-10-21: 25 mg via ORAL

## 2015-10-21 MED ORDER — ACETAMINOPHEN 325 MG PO TABS
650.0000 mg | ORAL_TABLET | Freq: Once | ORAL | Status: AC
  Administered 2015-10-21: 650 mg via ORAL

## 2015-10-21 MED ORDER — DIPHENHYDRAMINE HCL 25 MG PO CAPS
ORAL_CAPSULE | ORAL | Status: AC
  Filled 2015-10-21: qty 1

## 2015-10-21 MED ORDER — DARBEPOETIN ALFA 500 MCG/ML IJ SOSY
500.0000 ug | PREFILLED_SYRINGE | Freq: Once | INTRAMUSCULAR | Status: AC
  Administered 2015-10-21: 500 ug via SUBCUTANEOUS
  Filled 2015-10-21: qty 1

## 2015-10-21 MED ORDER — ACETAMINOPHEN 325 MG PO TABS
ORAL_TABLET | ORAL | Status: AC
Start: 2015-10-21 — End: 2015-10-21
  Filled 2015-10-21: qty 2

## 2015-10-21 MED ORDER — SODIUM CHLORIDE 0.9 % IV SOLN
250.0000 mL | Freq: Once | INTRAVENOUS | Status: AC
  Administered 2015-10-21: 250 mL via INTRAVENOUS

## 2015-10-21 NOTE — Patient Instructions (Signed)

## 2015-10-21 NOTE — Progress Notes (Signed)
HGB continues to drop,  Will get him set up for 1 unit of RBC's per Dr Alvy Bimler

## 2015-10-22 LAB — TYPE AND SCREEN
ABO/RH(D): A POS
ANTIBODY SCREEN: NEGATIVE
UNIT DIVISION: 0

## 2015-10-26 ENCOUNTER — Ambulatory Visit: Payer: Medicare Other | Admitting: Podiatry

## 2015-11-04 ENCOUNTER — Ambulatory Visit (HOSPITAL_BASED_OUTPATIENT_CLINIC_OR_DEPARTMENT_OTHER): Payer: Medicare Other

## 2015-11-04 ENCOUNTER — Other Ambulatory Visit: Payer: Self-pay | Admitting: Internal Medicine

## 2015-11-04 ENCOUNTER — Other Ambulatory Visit (HOSPITAL_BASED_OUTPATIENT_CLINIC_OR_DEPARTMENT_OTHER): Payer: Medicare Other

## 2015-11-04 VITALS — BP 121/47 | HR 60 | Temp 98.1°F

## 2015-11-04 DIAGNOSIS — N183 Chronic kidney disease, stage 3 (moderate): Secondary | ICD-10-CM

## 2015-11-04 DIAGNOSIS — D630 Anemia in neoplastic disease: Secondary | ICD-10-CM

## 2015-11-04 DIAGNOSIS — D469 Myelodysplastic syndrome, unspecified: Secondary | ICD-10-CM

## 2015-11-04 DIAGNOSIS — D631 Anemia in chronic kidney disease: Secondary | ICD-10-CM

## 2015-11-04 LAB — CBC & DIFF AND RETIC
BASO%: 0.5 % (ref 0.0–2.0)
BASOS ABS: 0 10*3/uL (ref 0.0–0.1)
EOS%: 3.1 % (ref 0.0–7.0)
Eosinophils Absolute: 0.2 10*3/uL (ref 0.0–0.5)
HEMATOCRIT: 24.9 % — AB (ref 38.4–49.9)
HGB: 8 g/dL — ABNORMAL LOW (ref 13.0–17.1)
Immature Retic Fract: 12.2 % — ABNORMAL HIGH (ref 3.00–10.60)
LYMPH%: 20.2 % (ref 14.0–49.0)
MCH: 30 pg (ref 27.2–33.4)
MCHC: 32.1 g/dL (ref 32.0–36.0)
MCV: 93.3 fL (ref 79.3–98.0)
MONO#: 0.6 10*3/uL (ref 0.1–0.9)
MONO%: 9.2 % (ref 0.0–14.0)
NEUT#: 4.1 10*3/uL (ref 1.5–6.5)
NEUT%: 67 % (ref 39.0–75.0)
PLATELETS: 62 10*3/uL — AB (ref 140–400)
RBC: 2.67 10*6/uL — ABNORMAL LOW (ref 4.20–5.82)
RDW: 24.8 % — ABNORMAL HIGH (ref 11.0–14.6)
Retic %: 3.21 % — ABNORMAL HIGH (ref 0.80–1.80)
Retic Ct Abs: 85.71 10*3/uL (ref 34.80–93.90)
WBC: 6.1 10*3/uL (ref 4.0–10.3)
lymph#: 1.2 10*3/uL (ref 0.9–3.3)

## 2015-11-04 MED ORDER — DARBEPOETIN ALFA 500 MCG/ML IJ SOSY
500.0000 ug | PREFILLED_SYRINGE | Freq: Once | INTRAMUSCULAR | Status: AC
  Administered 2015-11-04: 500 ug via SUBCUTANEOUS
  Filled 2015-11-04: qty 1

## 2015-11-10 ENCOUNTER — Encounter: Payer: Self-pay | Admitting: Podiatry

## 2015-11-10 ENCOUNTER — Ambulatory Visit (INDEPENDENT_AMBULATORY_CARE_PROVIDER_SITE_OTHER): Payer: Medicare Other | Admitting: Podiatry

## 2015-11-10 DIAGNOSIS — M79673 Pain in unspecified foot: Secondary | ICD-10-CM | POA: Diagnosis not present

## 2015-11-10 DIAGNOSIS — G609 Hereditary and idiopathic neuropathy, unspecified: Secondary | ICD-10-CM

## 2015-11-10 DIAGNOSIS — B351 Tinea unguium: Secondary | ICD-10-CM

## 2015-11-10 NOTE — Progress Notes (Signed)
Patient ID: Anthony Hutchinson, male   DOB: Jun 08, 1922, 82 y.o.   MRN: [id number]  Subjective: This patient presents for scheduled visit complaining of thickened and elongated toenails and walking wearing shoes and requests nail debridement Patient's daughter present treatment room  Objective: Orientated 3 Toenails are brittle, discolored, elongated, deformed 6-10  Assessment: Mycotic toenails 6-10 Idiopathic peripheral neuropathy  Plan: Debridement toenails 6-10 mechanically and electrically without any bleeding  Reappoint 3 months

## 2015-11-18 ENCOUNTER — Ambulatory Visit (HOSPITAL_BASED_OUTPATIENT_CLINIC_OR_DEPARTMENT_OTHER): Payer: Medicare Other

## 2015-11-18 ENCOUNTER — Ambulatory Visit (HOSPITAL_BASED_OUTPATIENT_CLINIC_OR_DEPARTMENT_OTHER): Payer: Medicare Other | Admitting: Hematology and Oncology

## 2015-11-18 ENCOUNTER — Encounter: Payer: Self-pay | Admitting: Hematology and Oncology

## 2015-11-18 ENCOUNTER — Other Ambulatory Visit (HOSPITAL_BASED_OUTPATIENT_CLINIC_OR_DEPARTMENT_OTHER): Payer: Medicare Other

## 2015-11-18 ENCOUNTER — Telehealth: Payer: Self-pay | Admitting: Hematology and Oncology

## 2015-11-18 VITALS — BP 125/80 | HR 70 | Temp 97.5°F | Resp 17 | Ht 68.0 in | Wt 166.2 lb

## 2015-11-18 DIAGNOSIS — Z515 Encounter for palliative care: Secondary | ICD-10-CM | POA: Diagnosis not present

## 2015-11-18 DIAGNOSIS — K625 Hemorrhage of anus and rectum: Secondary | ICD-10-CM | POA: Diagnosis not present

## 2015-11-18 DIAGNOSIS — N183 Chronic kidney disease, stage 3 (moderate): Secondary | ICD-10-CM

## 2015-11-18 DIAGNOSIS — D631 Anemia in chronic kidney disease: Secondary | ICD-10-CM

## 2015-11-18 DIAGNOSIS — D469 Myelodysplastic syndrome, unspecified: Secondary | ICD-10-CM

## 2015-11-18 DIAGNOSIS — D696 Thrombocytopenia, unspecified: Secondary | ICD-10-CM | POA: Diagnosis not present

## 2015-11-18 DIAGNOSIS — D630 Anemia in neoplastic disease: Secondary | ICD-10-CM

## 2015-11-18 LAB — IRON AND TIBC
%SAT: 20 % (ref 20–55)
Iron: 47 ug/dL (ref 42–163)
TIBC: 234 ug/dL (ref 202–409)
UIBC: 187 ug/dL (ref 117–376)

## 2015-11-18 LAB — CBC & DIFF AND RETIC
BASO%: 0.8 % (ref 0.0–2.0)
BASOS ABS: 0 10*3/uL (ref 0.0–0.1)
EOS%: 2.9 % (ref 0.0–7.0)
Eosinophils Absolute: 0.1 10*3/uL (ref 0.0–0.5)
HEMATOCRIT: 25.3 % — AB (ref 38.4–49.9)
HEMOGLOBIN: 7.9 g/dL — AB (ref 13.0–17.1)
Immature Retic Fract: 13.2 % — ABNORMAL HIGH (ref 3.00–10.60)
LYMPH#: 0.9 10*3/uL (ref 0.9–3.3)
LYMPH%: 23.9 % (ref 14.0–49.0)
MCH: 30.2 pg (ref 27.2–33.4)
MCHC: 31.2 g/dL — ABNORMAL LOW (ref 32.0–36.0)
MCV: 96.8 fL (ref 79.3–98.0)
MONO#: 0.4 10*3/uL (ref 0.1–0.9)
MONO%: 9.9 % (ref 0.0–14.0)
NEUT%: 62.5 % (ref 39.0–75.0)
NEUTROS ABS: 2.4 10*3/uL (ref 1.5–6.5)
Platelets: 68 10*3/uL — ABNORMAL LOW (ref 140–400)
RBC: 2.61 10*6/uL — ABNORMAL LOW (ref 4.20–5.82)
RDW: 27.7 % — AB (ref 11.0–14.6)
Retic %: 2.62 % — ABNORMAL HIGH (ref 0.80–1.80)
Retic Ct Abs: 68.38 10*3/uL (ref 34.80–93.90)
WBC: 3.9 10*3/uL — ABNORMAL LOW (ref 4.0–10.3)

## 2015-11-18 LAB — FERRITIN: FERRITIN: 117 ng/mL (ref 22–316)

## 2015-11-18 MED ORDER — DARBEPOETIN ALFA 500 MCG/ML IJ SOSY
500.0000 ug | PREFILLED_SYRINGE | Freq: Once | INTRAMUSCULAR | Status: AC
  Administered 2015-11-18: 500 ug via SUBCUTANEOUS
  Filled 2015-11-18: qty 1

## 2015-11-18 NOTE — Assessment & Plan Note (Signed)
Given his multiple comorbidities, he may have an element of anemia chronic disease, likely myelodysplastic syndrome with intermittent bleeding He has responded very well to Aranesp until recently which I suspect the drop of hemoglobin and platelet was due to consumption I plan to continue his injection at 500 g every 2 weeks to keep hemoglobin above 11 g. The patient can also received blood transfusion as needed for symptomatic anemia. We discussed the risks and benefit of blood transfusion and ultimately we decided that he does not need blood transfusion today.  We discussed limitation of other treatment at his age and he is comfortable to continue conservative management with ESA and transfusion as needed

## 2015-11-18 NOTE — Telephone Encounter (Signed)
Gave and prnted appt sched and avs for pt for May thru [facility]

## 2015-11-18 NOTE — Assessment & Plan Note (Signed)
The most likely cause of this is due to consumption from bleeding and CT scan April 2016 showed liver cirrhosis and splenomegaly Low-grade myelodysplastic syndrome can also cause thrombocytopenia. The present time, he can be observed as long as platelet count is greater than 50,000.

## 2015-11-18 NOTE — Assessment & Plan Note (Signed)
We discussed goals of care, advanced directives and living will I gave him additional resources from the [facility].

## 2015-11-18 NOTE — Assessment & Plan Note (Signed)
He has chronic intermittent rectal bleeding. 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 His rectal bleeding has stopped recently. I recommend a trial of Amicar if he bleeds again If the bleeding resume again, I would recommend 500 mg every 8 hours Amicar for 7-10 days to stop the bleeding. With this low platelet count & radiation proctitis, I think he would be at risk for further bleeding in the future.

## 2015-11-18 NOTE — Progress Notes (Signed)
[facility] OFFICE PROGRESS NOTE  Patient Care Team: Merrilee Seashore, MD as PCP - General (Internal Medicine) Heath Lark, MD as Consulting Physician (Hematology and Oncology)  SUMMARY OF ONCOLOGIC HISTORY: Oncology History   MDS, low IPSS score     MDS (myelodysplastic syndrome) ([place])   12/09/2004 Bone Marrow Biopsy Bone marrow biopsy showed HYPERCELLULAR MARROW WITH DYSPOIETIC FEATURES AND RING SIDEROBLASTS,  FAVOR MYELODYSPLASTIC SYNDROME   05/26/2011 - 01/01/2014 Chemotherapy The patient was given Procrit every 3 weeks 40,000 units for anemia.   01/22/2014 -  Chemotherapy He is placed on Aranesp 300 mcg   03/26/2014 -  Chemotherapy Aranesp is increased to 500 mcg    INTERVAL HISTORY: Please see below for problem oriented charting. He returns for further follow-up. He continues to have rare intermittent rectal bleeding. He continues to have abdominal discomfort on and off from the abdominal aortic aneurysm. He is still able to function well at home. He denies recent hematuria or epistaxis. He bruises easily. Denies chest pain or shortness of breath  REVIEW OF SYSTEMS:   Constitutional: Denies fevers, chills or abnormal weight loss Eyes: Denies blurriness of vision Ears, nose, mouth, throat, and face: Denies mucositis or sore throat Respiratory: Denies cough, dyspnea or wheezes Cardiovascular: Denies palpitation, chest discomfort or lower extremity swelling Gastrointestinal:  Denies nausea, heartburn or change in bowel habits Skin: Denies abnormal skin rashes Lymphatics: Denies new lymphadenopathy  Neurological:Denies numbness, tingling or new weaknesses Behavioral/Psych: Mood is stable, no new changes  All other systems were reviewed with the patient and are negative.  I have reviewed the past medical history, past surgical history, social history and family history with the patient and they are unchanged from previous note.  ALLERGIES:  is allergic to codeine  sulfate and penicillins.  MEDICATIONS:  Current Outpatient Prescriptions  Medication Sig Dispense Refill  . acetaminophen (TYLENOL) 500 MG tablet Take 1 tablet (500 mg total) by mouth every 6 (six) hours as needed. (Patient taking differently: Take 500 mg by mouth every 6 (six) hours as needed for mild pain, moderate pain or headache. ) 30 tablet 0  . alfuzosin (UROXATRAL) 10 MG 24 hr tablet Take 10 mg by mouth every evening.     Marland Kitchen aminocaproic acid (AMICAR) 500 MG tablet Take 1 tablet (500 mg total) by mouth every 8 (eight) hours as needed for bleeding. 30 tablet 0  . calcium carbonate (TUMS - DOSED IN MG ELEMENTAL CALCIUM) 500 MG chewable tablet Chew 1 tablet by mouth daily as needed for indigestion or heartburn.    . furosemide (LASIX) 20 MG tablet Take 1 tablet (20 mg total) by mouth daily. 90 tablet 0  . ketoconazole (NIZORAL) 2 % cream Apply 1 application topically daily as needed for irritation (jock itch).     . loperamide (IMODIUM) 2 MG capsule Take 2 mg by mouth daily as needed for diarrhea or loose stools.    . Multiple Vitamin (MULTIVITAMIN WITH MINERALS) TABS Take 1 tablet by mouth every morning.    Marland Kitchen SYNTHROID 125 MCG tablet Take 125 mcg by mouth daily before breakfast.     . TOPROL XL 25 MG 24 hr tablet Take 25 mg by mouth daily.     No current facility-administered medications for this visit.    PHYSICAL EXAMINATION: ECOG PERFORMANCE STATUS: 1 - Symptomatic but completely ambulatory  Filed Vitals:   11/18/15 0918  BP: 125/80  Pulse: 70  Temp: 97.5 F (36.4 C)  Resp: 17   Filed Weights  11/18/15 0918  Weight: 166 lb 3.2 oz (75.388 kg)    GENERAL:alert, no distress and comfortable. He looks elderly and frail SKIN: Multiple bruises and scabs are noted EYES: normal, Conjunctiva are pale and non-injected, sclera clear OROPHARYNX: Poor dentition is noted Musculoskeletal:no cyanosis of digits and no clubbing  NEURO: alert & oriented x 3 with fluent speech, no focal  motor/sensory deficits  LABORATORY DATA:  I have reviewed the data as listed    Component Value Date/Time   NA 138 05/06/2015 1830   NA 138 01/05/2014 1159   K 4.5 05/06/2015 1830   K 4.6 01/05/2014 1159   CL 104 05/06/2015 1830   CO2 25 05/06/2015 1825   CO2 25 01/05/2014 1159   GLUCOSE 152* 05/06/2015 1830   GLUCOSE 94 01/05/2014 1159   BUN 39* 05/06/2015 1830   BUN 25.0 01/05/2014 1159   CREATININE 1.60* 05/06/2015 1830   CREATININE 1.7* 01/05/2014 1159   CALCIUM 9.1 05/06/2015 1825   CALCIUM 9.3 01/05/2014 1159   PROT 7.2 05/06/2015 1825   PROT 7.1 01/05/2014 1159   ALBUMIN 3.9 05/06/2015 1825   ALBUMIN 3.7 01/05/2014 1159   AST 78* 05/06/2015 1825   AST 64* 01/05/2014 1159   ALT 53 05/06/2015 1825   ALT 70* 01/05/2014 1159   ALKPHOS 224* 05/06/2015 1825   ALKPHOS 254* 01/05/2014 1159   BILITOT 1.3* 05/06/2015 1825   BILITOT 0.91 01/05/2014 1159   GFRNONAA 37* 05/06/2015 1825   GFRAA 43* 05/06/2015 1825    No results found for: SPEP, UPEP  Lab Results  Component Value Date   WBC 3.9* 11/18/2015   NEUTROABS 2.4 11/18/2015   HGB 7.9* 11/18/2015   HCT 25.3* 11/18/2015   MCV 96.8 11/18/2015   PLT 68* 11/18/2015      Chemistry      Component Value Date/Time   NA 138 05/06/2015 1830   NA 138 01/05/2014 1159   K 4.5 05/06/2015 1830   K 4.6 01/05/2014 1159   CL 104 05/06/2015 1830   CO2 25 05/06/2015 1825   CO2 25 01/05/2014 1159   BUN 39* 05/06/2015 1830   BUN 25.0 01/05/2014 1159   CREATININE 1.60* 05/06/2015 1830   CREATININE 1.7* 01/05/2014 1159      Component Value Date/Time   CALCIUM 9.1 05/06/2015 1825   CALCIUM 9.3 01/05/2014 1159   ALKPHOS 224* 05/06/2015 1825   ALKPHOS 254* 01/05/2014 1159   AST 78* 05/06/2015 1825   AST 64* 01/05/2014 1159   ALT 53 05/06/2015 1825   ALT 70* 01/05/2014 1159   BILITOT 1.3* 05/06/2015 1825   BILITOT 0.91 01/05/2014 1159      ASSESSMENT & PLAN:  MDS (myelodysplastic syndrome) Given his multiple  comorbidities, he may have an element of anemia chronic disease, likely myelodysplastic syndrome with intermittent bleeding He has responded very well to Aranesp until recently which I suspect the drop of hemoglobin and platelet was due to consumption I plan to continue his injection at 500 g every 2 weeks to keep hemoglobin above 11 g. The patient can also received blood transfusion as needed for symptomatic anemia. We discussed the risks and benefit of blood transfusion and ultimately we decided that he does not need blood transfusion today.  We discussed limitation of other treatment at his age and he is comfortable to continue conservative management with ESA and transfusion as needed   Thrombocytopenia The most likely cause of this is due to consumption from bleeding and CT scan April 2016  showed liver cirrhosis and splenomegaly Low-grade myelodysplastic syndrome can also cause thrombocytopenia. The present time, he can be observed as long as platelet count is greater than 50,000.   Rectal bleeding He has chronic intermittent rectal bleeding. 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 His rectal bleeding has stopped recently. I recommend a trial of Amicar if he bleeds again If the bleeding resume again, I would recommend 500 mg every 8 hours Amicar for 7-10 days to stop the bleeding. With this low platelet count & radiation proctitis, I think he would be at risk for further bleeding in the future.  Quality of life palliative care encounter We discussed goals of care, advanced directives and living will I gave him additional resources from the [facility].    Orders Placed This Encounter  Procedures  . Hold Tube, Blood Bank    Standing Status: Standing     Number of Occurrences: 33     Standing Expiration Date: 11/17/2016   All questions were answered. The patient knows to call the clinic with any problems,  questions or concerns. No barriers to learning was detected. I spent 20 minutes counseling the patient face to face. The total time spent in the appointment was 25 minutes and more than 50% was on counseling and review of test results     [facility], [place], MD 11/18/2015 10:06 AM

## 2015-11-19 LAB — VITAMIN B12

## 2015-11-19 LAB — ERYTHROPOIETIN: Erythropoietin: 70.5 m[IU]/mL — ABNORMAL HIGH (ref 2.6–18.5)

## 2015-11-26 ENCOUNTER — Other Ambulatory Visit: Payer: Self-pay | Admitting: *Deleted

## 2015-11-26 ENCOUNTER — Telehealth: Payer: Self-pay | Admitting: *Deleted

## 2015-11-26 MED ORDER — AMINOCAPROIC ACID 500 MG PO TABS: 500.0000 mg | ORAL_TABLET | Freq: Three times a day (TID) | ORAL | Status: DC | PRN

## 2015-11-26 MED FILL — AMICAR 500 MG TABLET: 500 | 10 days supply | Qty: 30 | Fill #0

## 2015-11-26 NOTE — Telephone Encounter (Signed)
Document received and sent to HIM to scan

## 2015-12-02 ENCOUNTER — Other Ambulatory Visit: Payer: Self-pay | Admitting: Hematology and Oncology

## 2015-12-02 ENCOUNTER — Other Ambulatory Visit (HOSPITAL_BASED_OUTPATIENT_CLINIC_OR_DEPARTMENT_OTHER): Payer: Medicare Other

## 2015-12-02 ENCOUNTER — Ambulatory Visit (HOSPITAL_BASED_OUTPATIENT_CLINIC_OR_DEPARTMENT_OTHER): Payer: Medicare Other

## 2015-12-02 ENCOUNTER — Ambulatory Visit: Payer: Medicare Other

## 2015-12-02 ENCOUNTER — Ambulatory Visit (HOSPITAL_COMMUNITY)
Admission: RE | Admit: 2015-12-02 | Discharge: 2015-12-02 | Disposition: A | Discharge status: Home / Self Care | Payer: Medicare Other | Source: Ambulatory Visit | Attending: Hematology and Oncology | Admitting: Hematology and Oncology

## 2015-12-02 VITALS — BP 132/62 | HR 59 | Temp 96.8°F | Resp 18

## 2015-12-02 DIAGNOSIS — D469 Myelodysplastic syndrome, unspecified: Secondary | ICD-10-CM | POA: Diagnosis present

## 2015-12-02 DIAGNOSIS — D631 Anemia in chronic kidney disease: Secondary | ICD-10-CM

## 2015-12-02 DIAGNOSIS — D630 Anemia in neoplastic disease: Secondary | ICD-10-CM

## 2015-12-02 DIAGNOSIS — N183 Chronic kidney disease, stage 3 (moderate): Secondary | ICD-10-CM

## 2015-12-02 LAB — CBC & DIFF AND RETIC
BASO%: 0.4 % (ref 0.0–2.0)
Basophils Absolute: 0 10*3/uL (ref 0.0–0.1)
EOS%: 2 % (ref 0.0–7.0)
Eosinophils Absolute: 0.1 10*3/uL (ref 0.0–0.5)
HCT: 21.7 % — ABNORMAL LOW (ref 38.4–49.9)
HGB: 7 g/dL — ABNORMAL LOW (ref 13.0–17.1)
Immature Retic Fract: 13.8 % — ABNORMAL HIGH (ref 3.00–10.60)
LYMPH#: 0.7 10*3/uL — AB (ref 0.9–3.3)
LYMPH%: 14.7 % (ref 14.0–49.0)
MCH: 30 pg (ref 27.2–33.4)
MCHC: 32.3 g/dL (ref 32.0–36.0)
MCV: 93.1 fL (ref 79.3–98.0)
MONO#: 0.4 10*3/uL (ref 0.1–0.9)
MONO%: 8.8 % (ref 0.0–14.0)
NEUT%: 74.1 % (ref 39.0–75.0)
NEUTROS ABS: 3.7 10*3/uL (ref 1.5–6.5)
Platelets: 53 10*3/uL — ABNORMAL LOW (ref 140–400)
RBC: 2.33 10*6/uL — AB (ref 4.20–5.82)
RDW: 25.2 % — ABNORMAL HIGH (ref 11.0–14.6)
RETIC %: 2.93 % — AB (ref 0.80–1.80)
RETIC CT ABS: 68.27 10*3/uL (ref 34.80–93.90)
WBC: 5 10*3/uL (ref 4.0–10.3)
nRBC: 0 % (ref 0–0)

## 2015-12-02 LAB — TECHNOLOGIST REVIEW

## 2015-12-02 LAB — PREPARE RBC (CROSSMATCH)

## 2015-12-02 MED ORDER — ACETAMINOPHEN 325 MG PO TABS
ORAL_TABLET | ORAL | Status: AC
  Filled 2015-12-02: qty 2

## 2015-12-02 MED ORDER — SODIUM CHLORIDE 0.9 % IV SOLN
250.0000 mL | Freq: Once | INTRAVENOUS | Status: AC
  Administered 2015-12-02: 250 mL via INTRAVENOUS

## 2015-12-02 MED ORDER — DIPHENHYDRAMINE HCL 25 MG PO CAPS
ORAL_CAPSULE | ORAL | Status: AC
  Filled 2015-12-02: qty 1

## 2015-12-02 MED ORDER — DARBEPOETIN ALFA 500 MCG/ML IJ SOSY
500.0000 ug | PREFILLED_SYRINGE | Freq: Once | INTRAMUSCULAR | Status: AC
  Administered 2015-12-02: 500 ug via SUBCUTANEOUS
  Filled 2015-12-02: qty 1

## 2015-12-02 MED ORDER — SODIUM CHLORIDE 0.9% FLUSH
3.0000 mL | INTRAVENOUS | Status: DC | PRN
  Filled 2015-12-02: qty 10

## 2015-12-02 MED ORDER — DIPHENHYDRAMINE HCL 25 MG PO CAPS
25.0000 mg | ORAL_CAPSULE | Freq: Once | ORAL | Status: AC
  Administered 2015-12-02: 25 mg via ORAL

## 2015-12-02 MED ORDER — ACETAMINOPHEN 325 MG PO TABS
650.0000 mg | ORAL_TABLET | Freq: Once | ORAL | Status: AC
  Administered 2015-12-02: 650 mg via ORAL

## 2015-12-02 NOTE — Patient Instructions (Addendum)
Blood Transfusion  A blood transfusion is a procedure that gives you donated blood through an IV tube. You may need blood because of illness, surgery, or injury. The blood may come from a donor. The blood may also be your own blood that you donated earlier. The blood you get is made up of different types of cells. You may get:   Red blood cells. These carry oxygen and replace lost blood.   Platelets. These control bleeding.   Plasma. This helps blood to clot. If you have a clotting disorder, you may also get other types of blood products.  BEFORE THE PROCEDURE  You may have a blood test. This finds out what type of blood you have. It also finds out what kind of blood your body will accept.   If you are going to have a planned surgery, you may donate your own blood. This is done in case you need to have a transfusion.   If you have had an allergic transfusion reaction before, you may be given medicine to help prevent a reaction. Take this medicine only as told by your doctor.  You will have your temperature, blood pressure, and pulse checked. PROCEDURE   An IV will be started in your hand or arm.   The bag of donated blood will be attached to your IV and run into your vein.   A doctor will regularly check your temperature, blood pressure, and pulse during the procedure. This is done to find any early signs of a transfusion reaction.  If you have any signs or symptoms of a reaction, the procedure may be stopped and you may be given medicine.   When the transfusion is over, your IV will be removed.   Pressure may be applied to the IV site for a few minutes.   A bandage (dressing) will be applied.  The procedure may vary among doctors and hospitals.  AFTER THE PROCEDURE  Your blood pressure, temperature, and pulse will be checked regularly.   This information is not intended to replace advice given to you by your health care provider. Make sure you discuss any questions  you have with your health care provider.   Document Released: 09/22/2008 Document Revised: 07/17/2014 Document Reviewed: 05/06/2014 Elsevier Interactive Patient Education 2016 Elsevier Inc. Darbepoetin Alfa injection What is this medicine? DARBEPOETIN ALFA (dar be POE e tin AL fa) helps your body make more red blood cells. It is used to treat anemia caused by chronic kidney failure and chemotherapy. This medicine may be used for other purposes; ask your health care provider or pharmacist if you have questions. What should I tell my health care provider before I take this medicine? They need to know if you have any of these conditions: -blood clotting disorders or history of blood clots -cancer patient not on chemotherapy -cystic fibrosis -heart disease, such as angina, heart failure, or a history of a heart attack -hemoglobin level of 12 g/dL or greater -high blood pressure -low levels of folate, iron, or vitamin B12 -seizures -an unusual or allergic reaction to darbepoetin, erythropoietin, albumin, hamster proteins, latex, other medicines, foods, dyes, or preservatives -pregnant or trying to get pregnant -breast-feeding How should I use this medicine? This medicine is for injection into a vein or under the skin. It is usually given by a health care professional in a hospital or clinic setting. If you get this medicine at home, you will be taught how to prepare and give this medicine. Do not shake  the solution before you withdraw a dose. Use exactly as directed. Take your medicine at regular intervals. Do not take your medicine more often than directed. It is important that you put your used needles and syringes in a special sharps container. Do not put them in a trash can. If you do not have a sharps container, call your pharmacist or healthcare provider to get one. Talk to your pediatrician regarding the use of this medicine in children. While this medicine may be used in children as  young as 1 year for selected conditions, precautions do apply. Overdosage: If you think you have taken too much of this medicine contact a poison control center or emergency room at once. NOTE: This medicine is only for you. Do not share this medicine with others. What if I miss a dose? If you miss a dose, take it as soon as you can. If it is almost time for your next dose, take only that dose. Do not take double or extra doses. What may interact with this medicine? Do not take this medicine with any of the following medications: -epoetin alfa This list may not describe all possible interactions. Give your health care provider a list of all the medicines, herbs, non-prescription drugs, or dietary supplements you use. Also tell them if you smoke, drink alcohol, or use illegal drugs. Some items may interact with your medicine. What should I watch for while using this medicine? Visit your prescriber or health care professional for regular checks on your progress and for the needed blood tests and blood pressure measurements. It is especially important for the doctor to make sure your hemoglobin level is in the desired range, to limit the risk of potential side effects and to give you the best benefit. Keep all appointments for any recommended tests. Check your blood pressure as directed. Ask your doctor what your blood pressure should be and when you should contact him or her. As your body makes more red blood cells, you may need to take iron, folic acid, or vitamin B supplements. Ask your doctor or health care provider which products are right for you. If you have kidney disease continue dietary restrictions, even though this medication can make you feel better. Talk with your doctor or health care professional about the foods you eat and the vitamins that you take. What side effects may I notice from receiving this medicine? Side effects that you should report to your doctor or health care professional  as soon as possible: -allergic reactions like skin rash, itching or hives, swelling of the face, lips, or tongue -breathing problems -changes in vision -chest pain -confusion, trouble speaking or understanding -feeling faint or lightheaded, falls -high blood pressure -muscle aches or pains -pain, swelling, warmth in the leg -rapid weight gain -severe headaches -sudden numbness or weakness of the face, arm or leg -trouble walking, dizziness, loss of balance or coordination -seizures (convulsions) -swelling of the ankles, feet, hands -unusually weak or tired Side effects that usually do not require medical attention (report to your doctor or health care professional if they continue or are bothersome): -diarrhea -fever, chills (flu-like symptoms) -headaches -nausea, vomiting -redness, stinging, or swelling at site where injected This list may not describe all possible side effects. Call your doctor for medical advice about side effects. You may report side effects to FDA at 1-800-FDA-1088. Where should I keep my medicine? Keep out of the reach of children. Store in a refrigerator between 2 and 8 degrees C (36  and 46 degrees F). Do not freeze. Do not shake. Throw away any unused portion if using a single-dose vial. Throw away any unused medicine after the expiration date. NOTE: This sheet is a summary. It may not cover all possible information. If you have questions about this medicine, talk to your doctor, pharmacist, or health care provider.    2016, Elsevier/Gold Standard. (2008-06-09 10:23:57)

## 2015-12-03 LAB — TYPE AND SCREEN
ABO/RH(D): A POS
ANTIBODY SCREEN: NEGATIVE
Unit division: 0

## 2015-12-14 ENCOUNTER — Encounter: Payer: Self-pay | Admitting: Internal Medicine

## 2015-12-14 ENCOUNTER — Ambulatory Visit (INDEPENDENT_AMBULATORY_CARE_PROVIDER_SITE_OTHER): Payer: Medicare Other | Admitting: Internal Medicine

## 2015-12-14 VITALS — BP 140/60 | HR 67 | Ht 68.0 in | Wt 169.2 lb

## 2015-12-14 DIAGNOSIS — I5022 Chronic systolic (congestive) heart failure: Secondary | ICD-10-CM

## 2015-12-14 MED ORDER — FUROSEMIDE 40 MG PO TABS: 40.0000 mg | ORAL_TABLET | Freq: Every day | ORAL | Status: DC

## 2015-12-14 NOTE — Progress Notes (Signed)
HPI Mr. Cotrone returns today for followup. He is a pleasant elderly man with complete heart block, s/p PPM insertion, chronic anemia on EPO, HTN, Myelodysplastic syndrome, and DM. He has worsening peripheral edema, which has been treated with very low dose diuretic therapy. He has had some increased dyspnea. He denies dietary indiscretion. Allergies  Allergen Reactions  . Codeine Sulfate     Itching on fingers   . Penicillins     Itching on fingers Has patient had a PCN reaction causing immediate rash, facial/tongue/throat swelling, SOB or lightheadedness with hypotension: Has patient had a PCN reaction causing severe rash involving mucus membranes or skin necrosis: Has patient had a PCN reaction that required hospitalization Has patient had a PCN reaction occurring within the last 10 years:  If all of the above answers are "NO", then may proceed with Cephalosporin use.      Current Outpatient Prescriptions  Medication Sig Dispense Refill  . acetaminophen (TYLENOL) 500 MG tablet Take 500 mg by mouth every 6 (six) hours as needed (pain).    Marland Kitchen alfuzosin (UROXATRAL) 10 MG 24 hr tablet Take 10 mg by mouth every evening.     Marland Kitchen aminocaproic acid (AMICAR) 500 MG tablet Take 1 tablet (500 mg total) by mouth every 8 (eight) hours as needed for bleeding. 30 tablet 0  . calcium carbonate (TUMS - DOSED IN MG ELEMENTAL CALCIUM) 500 MG chewable tablet Chew 1 tablet by mouth daily as needed for indigestion or heartburn.    . furosemide (LASIX) 40 MG tablet Take 1 tablet (40 mg total) by mouth daily. 90 tablet 3  . ketoconazole (NIZORAL) 2 % cream Apply 1 application topically daily as needed for irritation (jock itch).     . loperamide (IMODIUM) 2 MG capsule Take 2 mg by mouth daily as needed for diarrhea or loose stools.    . Multiple Vitamin (MULTIVITAMIN WITH MINERALS) TABS Take 1 tablet by mouth every morning.    Marland Kitchen SYNTHROID 125 MCG tablet Take 125 mcg by mouth daily before breakfast.     .  TOPROL XL 25 MG 24 hr tablet Take 25 mg by mouth daily.     No current facility-administered medications for this visit.     Past Medical History  Diagnosis Date  . Hypertension   . LBBB (left bundle branch block)   . Diabetes mellitus without complication ([place])   . Cancer ([place])   . Hypothyroidism     nodule  . Cataracts, bilateral   . Anemia     iron deficiency anemia  . Inguinal hernia   . Peripheral vascular disease ([place])     Peripheral neuropathy  . Complete heart block ([place])   . MDS (myelodysplastic syndrome) ([place]) 01/05/2014    ROS:   All systems reviewed and negative except as noted in the HPI.   Past Surgical History  Procedure Laterality Date  . Tonsillectomy    . Hiatal hernia repair    . Hernia repair  1979    bilateral inguinal hernia  . Thyroid lobectomy  6/85    right  . Acromionectomy  1998    rotator cuff repair  . Prostate surgery    . Abdominal aortic aneurysm repair    . Tonsillectomy    . Flexible sigmoidoscopy N/A 12/13/2012    Procedure: FLEXIBLE SIGMOIDOSCOPY;  Surgeon: Beryle Beams, MD;  Location: [facility] ENDOSCOPY;  Service: Endoscopy;  Laterality: N/A;  . Hot hemostasis N/A 12/13/2012    Procedure:  HOT HEMOSTASIS (ARGON PLASMA COAGULATION/BICAP);  Surgeon: Beryle Beams, MD;  Location: [facility] ENDOSCOPY;  Service: Endoscopy;  Laterality: N/A;  . Pacemaker insertion  11-03-13    STJ dual chamber pacemaker implanted by Dr Lovena Le for CHB  . Permanent pacemaker insertion N/A 11/03/2013    Procedure: PERMANENT PACEMAKER INSERTION;  Surgeon: Evans Lance, MD;  Location: [facility] CATH LAB;  Service: Cardiovascular;  Laterality: N/A;  . Flexible sigmoidoscopy N/A 03/12/2015    Procedure: FLEXIBLE SIGMOIDOSCOPY;  Surgeon: Carol Ada, MD;  Location: [facility] ENDOSCOPY;  Service: Endoscopy;  Laterality: N/A;  will need APC   . Hot hemostasis N/A 03/12/2015    Procedure: HOT HEMOSTASIS (ARGON PLASMA COAGULATION/BICAP);  Surgeon: Carol Ada, MD;  Location: [facility] ENDOSCOPY;   Service: Endoscopy;  Laterality: N/A;     Family History  Problem Relation Age of Onset  . Heart disease Mother   . Heart disease Father   . Heart disease Son   . Hyperlipidemia Son      Social History   Social History  . Marital Status: Married    Spouse Name: N/A  . Number of Children: N/A  . Years of Education: N/A   Occupational History  . Not on file.   Social History Main Topics  . Smoking status: Former Smoker    Types: Pipe    Quit date: 11/27/1963  . Smokeless tobacco: Never Used  . Alcohol Use: No  . Drug Use: No  . Sexual Activity: Not on file   Other Topics Concern  . Not on file   Social History Narrative     BP 140/60 mmHg  Pulse 67  Ht 5\' 8"  (1.727 m)  Wt 169 lb 3.2 oz (76.749 kg)  BMI 25.73 kg/m2  SpO2 98%  Physical Exam:  stable appearing 82 yo man, NAD HEENT: Unremarkable Neck:  7 cm JVD, no thyromegally Back:  No CVA tenderness Lungs:  Clear with no wheezes, well healed PM incision. HEART:  Regular rate rhythm, no murmurs, no rubs, no clicks Abd:  soft, positive bowel sounds, no organomegally, no rebound, no guarding Ext:  2 plus pulses, no edema, no cyanosis, no clubbing Skin:  No rashes no nodules Neuro:  CN II through XII intact, motor grossly intact  DEVICE  Normal device function.  See PaceArt for details.   Assess/Plan: 1. Chronic diastolic heart failure - his symptoms are class 2-3 and more right sided. I have asked him to increase his lasix to 40 mg daily. He will return for labs in a week. 2. HTN - his blood pressure is fairly well controlled. I have asked him to reduce his salt intake. 3. PPM - his [employer] DDD PM is working normally. 4. Atrial tachycardia - he has brief runs of atrial tachy at 170/min which has been asymptomatic.  Mikle Bosworth.D.

## 2015-12-14 NOTE — Progress Notes (Deleted)
Patient ID: Anthony Hutchinson, male   DOB: [birth year], 82 y.o.   MRN: [id number]

## 2015-12-14 NOTE — Patient Instructions (Signed)
Medication Instructions:  Your physician has recommended you make the following change in your medication:  1) Increase Furosemide to 40 mg daily   Labwork: Your physician recommends that you return for lab work in: 1 week BMP   Testing/Procedures: None ordered   Follow-Up: Your physician wants you to follow-up in: 12 months with Dr Knox Saliva will receive a reminder letter in the mail two months in advance. If you don't receive a letter, please call our office to schedule the follow-up appointment.  Remote monitoring is used to monitor your Pacemaker  from home. This monitoring reduces the number of office visits required to check your device to one time per year. It allows [place] to keep an eye on the functioning of your device to ensure it is working properly. You are scheduled for a device check from home on 03/15/16. You may send your transmission at any time that day. If you have a wireless device, the transmission will be sent automatically. After your physician reviews your transmission, you will receive a postcard with your next transmission date.    Any Other Special Instructions Will Be Listed Below (If Applicable).     If you need a refill on your cardiac medications before your next appointment, please call your pharmacy.

## 2015-12-16 ENCOUNTER — Ambulatory Visit: Payer: Medicare Other

## 2015-12-16 ENCOUNTER — Other Ambulatory Visit: Payer: Self-pay | Admitting: Medical Oncology

## 2015-12-16 ENCOUNTER — Other Ambulatory Visit (HOSPITAL_BASED_OUTPATIENT_CLINIC_OR_DEPARTMENT_OTHER): Payer: Medicare Other

## 2015-12-16 ENCOUNTER — Ambulatory Visit (HOSPITAL_BASED_OUTPATIENT_CLINIC_OR_DEPARTMENT_OTHER): Payer: Medicare Other

## 2015-12-16 VITALS — BP 172/57 | HR 68 | Temp 97.2°F | Resp 19

## 2015-12-16 DIAGNOSIS — D630 Anemia in neoplastic disease: Secondary | ICD-10-CM

## 2015-12-16 DIAGNOSIS — N183 Chronic kidney disease, stage 3 (moderate): Secondary | ICD-10-CM

## 2015-12-16 DIAGNOSIS — D631 Anemia in chronic kidney disease: Secondary | ICD-10-CM | POA: Diagnosis not present

## 2015-12-16 DIAGNOSIS — D469 Myelodysplastic syndrome, unspecified: Secondary | ICD-10-CM

## 2015-12-16 LAB — CBC & DIFF AND RETIC
BASO%: 0.4 % (ref 0.0–2.0)
BASOS ABS: 0 10*3/uL (ref 0.0–0.1)
EOS%: 2.9 % (ref 0.0–7.0)
Eosinophils Absolute: 0.2 10*3/uL (ref 0.0–0.5)
HEMATOCRIT: 26.2 % — AB (ref 38.4–49.9)
HGB: 8.5 g/dL — ABNORMAL LOW (ref 13.0–17.1)
Immature Retic Fract: 12.5 % — ABNORMAL HIGH (ref 3.00–10.60)
LYMPH%: 21.4 % (ref 14.0–49.0)
MCH: 30.6 pg (ref 27.2–33.4)
MCHC: 32.4 g/dL (ref 32.0–36.0)
MCV: 94.2 fL (ref 79.3–98.0)
MONO#: 0.6 10*3/uL (ref 0.1–0.9)
MONO%: 8.3 % (ref 0.0–14.0)
NEUT#: 5.1 10*3/uL (ref 1.5–6.5)
NEUT%: 67 % (ref 39.0–75.0)
PLATELETS: 71 10*3/uL — AB (ref 140–400)
RBC: 2.78 10*6/uL — ABNORMAL LOW (ref 4.20–5.82)
RDW: 25.3 % — AB (ref 11.0–14.6)
RETIC %: 3.94 % — AB (ref 0.80–1.80)
Retic Ct Abs: 109.53 10*3/uL — ABNORMAL HIGH (ref 34.80–93.90)
WBC: 7.6 10*3/uL (ref 4.0–10.3)
lymph#: 1.6 10*3/uL (ref 0.9–3.3)

## 2015-12-16 LAB — TECHNOLOGIST REVIEW

## 2015-12-16 MED ORDER — DARBEPOETIN ALFA 500 MCG/ML IJ SOSY
500.0000 ug | PREFILLED_SYRINGE | Freq: Once | INTRAMUSCULAR | Status: AC
  Administered 2015-12-16: 500 ug via SUBCUTANEOUS
  Filled 2015-12-16: qty 1

## 2015-12-16 NOTE — Progress Notes (Signed)
Completed in Infusion room 

## 2015-12-21 ENCOUNTER — Other Ambulatory Visit (INDEPENDENT_AMBULATORY_CARE_PROVIDER_SITE_OTHER): Payer: Medicare Other | Admitting: *Deleted

## 2015-12-21 DIAGNOSIS — I5022 Chronic systolic (congestive) heart failure: Secondary | ICD-10-CM | POA: Diagnosis not present

## 2015-12-21 LAB — BASIC METABOLIC PANEL
BUN: 59 mg/dL — AB (ref 7–25)
CALCIUM: 8 mg/dL — AB (ref 8.6–10.3)
CO2: 23 mmol/L (ref 20–31)
Chloride: 104 mmol/L (ref 98–110)
Creat: 2.22 mg/dL — ABNORMAL HIGH (ref 0.70–1.11)
GLUCOSE: 256 mg/dL — AB (ref 65–99)
Potassium: 4.4 mmol/L (ref 3.5–5.3)
SODIUM: 135 mmol/L (ref 135–146)

## 2015-12-21 NOTE — Addendum Note (Signed)
Addended by: Eulis Foster on: 12/21/2015 11:09 AM   Modules accepted: Orders

## 2015-12-22 ENCOUNTER — Other Ambulatory Visit: Payer: Self-pay | Admitting: *Deleted

## 2015-12-22 MED ORDER — FUROSEMIDE 20 MG PO TABS: 20.0000 mg | ORAL_TABLET | Freq: Every day | ORAL | Status: DC

## 2015-12-30 ENCOUNTER — Other Ambulatory Visit: Payer: Self-pay | Admitting: Hematology and Oncology

## 2015-12-30 ENCOUNTER — Ambulatory Visit: Payer: Medicare Other

## 2015-12-30 ENCOUNTER — Ambulatory Visit (HOSPITAL_COMMUNITY)
Admission: RE | Admit: 2015-12-30 | Discharge: 2015-12-30 | Disposition: A | Discharge status: Home / Self Care | Payer: Medicare Other | Source: Ambulatory Visit | Attending: Hematology and Oncology | Admitting: Hematology and Oncology

## 2015-12-30 ENCOUNTER — Ambulatory Visit (HOSPITAL_BASED_OUTPATIENT_CLINIC_OR_DEPARTMENT_OTHER): Payer: Medicare Other

## 2015-12-30 ENCOUNTER — Other Ambulatory Visit (HOSPITAL_BASED_OUTPATIENT_CLINIC_OR_DEPARTMENT_OTHER): Payer: Medicare Other

## 2015-12-30 VITALS — BP 156/63 | HR 60 | Temp 97.9°F | Resp 18

## 2015-12-30 DIAGNOSIS — N183 Chronic kidney disease, stage 3 (moderate): Secondary | ICD-10-CM

## 2015-12-30 DIAGNOSIS — D469 Myelodysplastic syndrome, unspecified: Secondary | ICD-10-CM | POA: Diagnosis not present

## 2015-12-30 DIAGNOSIS — D630 Anemia in neoplastic disease: Secondary | ICD-10-CM | POA: Diagnosis not present

## 2015-12-30 DIAGNOSIS — D631 Anemia in chronic kidney disease: Secondary | ICD-10-CM

## 2015-12-30 DIAGNOSIS — N189 Chronic kidney disease, unspecified: Secondary | ICD-10-CM

## 2015-12-30 LAB — PREPARE RBC (CROSSMATCH)

## 2015-12-30 LAB — CBC & DIFF AND RETIC
BASO%: 0.3 % (ref 0.0–2.0)
BASOS ABS: 0 10*3/uL (ref 0.0–0.1)
EOS%: 4.2 % (ref 0.0–7.0)
Eosinophils Absolute: 0.3 10*3/uL (ref 0.0–0.5)
HEMATOCRIT: 22.3 % — AB (ref 38.4–49.9)
HEMOGLOBIN: 7 g/dL — AB (ref 13.0–17.1)
Immature Retic Fract: 9.3 % (ref 3.00–10.60)
LYMPH#: 1.4 10*3/uL (ref 0.9–3.3)
LYMPH%: 21.2 % (ref 14.0–49.0)
MCH: 30.2 pg (ref 27.2–33.4)
MCHC: 31.4 g/dL — AB (ref 32.0–36.0)
MCV: 96.1 fL (ref 79.3–98.0)
MONO#: 0.6 10*3/uL (ref 0.1–0.9)
MONO%: 9.3 % (ref 0.0–14.0)
NEUT#: 4.2 10*3/uL (ref 1.5–6.5)
NEUT%: 65 % (ref 39.0–75.0)
Platelets: 63 10*3/uL — ABNORMAL LOW (ref 140–400)
RBC: 2.32 10*6/uL — ABNORMAL LOW (ref 4.20–5.82)
RDW: 25.8 % — AB (ref 11.0–14.6)
RETIC %: 3.28 % — AB (ref 0.80–1.80)
Retic Ct Abs: 76.1 10*3/uL (ref 34.80–93.90)
WBC: 6.5 10*3/uL (ref 4.0–10.3)
nRBC: 0 % (ref 0–0)

## 2015-12-30 MED ORDER — DIPHENHYDRAMINE HCL 25 MG PO CAPS
25.0000 mg | ORAL_CAPSULE | Freq: Once | ORAL | Status: AC
  Administered 2015-12-30: 25 mg via ORAL

## 2015-12-30 MED ORDER — DARBEPOETIN ALFA 500 MCG/ML IJ SOSY
500.0000 ug | PREFILLED_SYRINGE | Freq: Once | INTRAMUSCULAR | Status: AC
  Administered 2015-12-30: 500 ug via SUBCUTANEOUS
  Filled 2015-12-30: qty 1

## 2015-12-30 MED ORDER — SODIUM CHLORIDE 0.9 % IV SOLN
250.0000 mL | Freq: Once | INTRAVENOUS | Status: AC
  Administered 2015-12-30: 250 mL via INTRAVENOUS

## 2015-12-30 MED ORDER — DIPHENHYDRAMINE HCL 25 MG PO CAPS
ORAL_CAPSULE | ORAL | Status: AC
  Filled 2015-12-30: qty 1

## 2015-12-30 MED ORDER — ACETAMINOPHEN 325 MG PO TABS
ORAL_TABLET | ORAL | Status: AC
  Filled 2015-12-30: qty 2

## 2015-12-30 MED ORDER — ACETAMINOPHEN 325 MG PO TABS
650.0000 mg | ORAL_TABLET | Freq: Once | ORAL | Status: AC
  Administered 2015-12-30: 650 mg via ORAL

## 2015-12-30 NOTE — Patient Instructions (Signed)

## 2015-12-31 LAB — TYPE AND SCREEN
ABO/RH(D): A POS
ANTIBODY SCREEN: NEGATIVE
UNIT DIVISION: 0
UNIT DIVISION: 0

## 2016-01-06 ENCOUNTER — Telehealth: Payer: Self-pay | Admitting: Hematology and Oncology

## 2016-01-06 ENCOUNTER — Telehealth: Payer: Self-pay | Admitting: *Deleted

## 2016-01-06 ENCOUNTER — Other Ambulatory Visit: Payer: Self-pay | Admitting: *Deleted

## 2016-01-06 NOTE — Telephone Encounter (Signed)
Per staff message and POF I have scheduled appts. Advised scheduler of appts. JMW  

## 2016-01-06 NOTE — Telephone Encounter (Signed)
cld pt and left message to be here @8  for labs 6/30 before blood appt-cld before someone picked up phone and hung up. Messaage was left.

## 2016-01-06 NOTE — Telephone Encounter (Signed)
Son called and reports pt having intermittent rectal bleeding for 3 days now.  Pt is taking his Amicar as directed.  Notified Dr. Alvy Bimler and she instructs for pt to come in tomorrow for lab and transfusion.   Informed son to expect call from scheduler w/ time for tomorrow.  I sent urgent request.  He verbalized understanding.

## 2016-01-07 ENCOUNTER — Telehealth: Payer: Self-pay | Admitting: *Deleted

## 2016-01-07 ENCOUNTER — Ambulatory Visit (HOSPITAL_BASED_OUTPATIENT_CLINIC_OR_DEPARTMENT_OTHER): Payer: Medicare Other

## 2016-01-07 ENCOUNTER — Other Ambulatory Visit (HOSPITAL_BASED_OUTPATIENT_CLINIC_OR_DEPARTMENT_OTHER): Payer: Medicare Other

## 2016-01-07 DIAGNOSIS — D469 Myelodysplastic syndrome, unspecified: Secondary | ICD-10-CM | POA: Diagnosis not present

## 2016-01-07 LAB — CBC & DIFF AND RETIC
BASO%: 0.3 % (ref 0.0–2.0)
Basophils Absolute: 0 10*3/uL (ref 0.0–0.1)
EOS ABS: 0.2 10*3/uL (ref 0.0–0.5)
EOS%: 3.4 % (ref 0.0–7.0)
HCT: 26.9 % — ABNORMAL LOW (ref 38.4–49.9)
HEMOGLOBIN: 8.6 g/dL — AB (ref 13.0–17.1)
Immature Retic Fract: 18.7 % — ABNORMAL HIGH (ref 3.00–10.60)
LYMPH%: 18.1 % (ref 14.0–49.0)
MCH: 30.1 pg (ref 27.2–33.4)
MCHC: 32 g/dL (ref 32.0–36.0)
MCV: 94.1 fL (ref 79.3–98.0)
MONO#: 0.4 10*3/uL (ref 0.1–0.9)
MONO%: 6.8 % (ref 0.0–14.0)
NEUT%: 71.4 % (ref 39.0–75.0)
NEUTROS ABS: 4.7 10*3/uL (ref 1.5–6.5)
Platelets: 103 10*3/uL — ABNORMAL LOW (ref 140–400)
RBC: 2.86 10*6/uL — ABNORMAL LOW (ref 4.20–5.82)
RDW: 22.5 % — AB (ref 11.0–14.6)
RETIC %: 0.69 % — AB (ref 0.80–1.80)
Retic Ct Abs: 19.73 10*3/uL — ABNORMAL LOW (ref 34.80–93.90)
WBC: 6.5 10*3/uL (ref 4.0–10.3)
lymph#: 1.2 10*3/uL (ref 0.9–3.3)

## 2016-01-07 NOTE — Telephone Encounter (Signed)
Pt did not show up for his lab appt this morning.  Called pt's son and he was unaware of appt states no one called his cell phone as requested but he will bring pt in as soon as he can.  They should be here in 30 minutes. Notified Infusion RN.

## 2016-01-07 NOTE — Progress Notes (Signed)
Hemoglobin 8.6, Platelets 103, pt states he feels "good today", no dizziness, SOB or any other sympotoms at this time. Pt reports that he has blood in his stools on and off for three days this week and a few times blood from rectum with no BM. Pt states he had no blood in his stool today and that he has not had any signs of rectal bleeding today.  Dr. Alvy Bimler aware and states that pt does not need a transfusion today. Pt and son aware and educated to call clinic if symptoms worsen, pt and son verbalized understanding. Pt in stable condition at time of discharge.

## 2016-01-09 ENCOUNTER — Emergency Department (HOSPITAL_COMMUNITY)
Admission: EM | Admit: 2016-01-09 | Discharge: 2016-01-09 | Disposition: A | Discharge status: Home / Self Care | Payer: Medicare Other | Attending: Emergency Medicine | Admitting: Emergency Medicine

## 2016-01-09 ENCOUNTER — Encounter (HOSPITAL_COMMUNITY): Payer: Self-pay

## 2016-01-09 DIAGNOSIS — Z950 Presence of cardiac pacemaker: Secondary | ICD-10-CM | POA: Diagnosis not present

## 2016-01-09 DIAGNOSIS — K649 Unspecified hemorrhoids: Secondary | ICD-10-CM

## 2016-01-09 DIAGNOSIS — Z859 Personal history of malignant neoplasm, unspecified: Secondary | ICD-10-CM | POA: Insufficient documentation

## 2016-01-09 DIAGNOSIS — K644 Residual hemorrhoidal skin tags: Secondary | ICD-10-CM | POA: Insufficient documentation

## 2016-01-09 DIAGNOSIS — Z87891 Personal history of nicotine dependence: Secondary | ICD-10-CM | POA: Insufficient documentation

## 2016-01-09 DIAGNOSIS — I10 Essential (primary) hypertension: Secondary | ICD-10-CM | POA: Insufficient documentation

## 2016-01-09 DIAGNOSIS — K625 Hemorrhage of anus and rectum: Secondary | ICD-10-CM | POA: Diagnosis present

## 2016-01-09 DIAGNOSIS — E119 Type 2 diabetes mellitus without complications: Secondary | ICD-10-CM | POA: Diagnosis not present

## 2016-01-09 LAB — CBC WITH DIFFERENTIAL/PLATELET
BASOS ABS: 0 10*3/uL (ref 0.0–0.1)
Basophils Relative: 0 %
EOS ABS: 0.1 10*3/uL (ref 0.0–0.7)
Eosinophils Relative: 2 %
HEMATOCRIT: 24 % — AB (ref 39.0–52.0)
HEMOGLOBIN: 7.4 g/dL — AB (ref 13.0–17.0)
LYMPHS PCT: 19 %
Lymphs Abs: 1 10*3/uL (ref 0.7–4.0)
MCH: 29.5 pg (ref 26.0–34.0)
MCHC: 30.8 g/dL (ref 30.0–36.0)
MCV: 95.6 fL (ref 78.0–100.0)
MONOS PCT: 8 %
Monocytes Absolute: 0.4 10*3/uL (ref 0.1–1.0)
NEUTROS PCT: 71 %
Neutro Abs: 3.7 10*3/uL (ref 1.7–7.7)
Platelets: 70 10*3/uL — ABNORMAL LOW (ref 150–400)
RBC: 2.51 MIL/uL — AB (ref 4.22–5.81)
RDW: 22.9 % — AB (ref 11.5–15.5)
WBC: 5.2 10*3/uL (ref 4.0–10.5)

## 2016-01-09 MED ORDER — HYDROCORTISONE ACETATE 25 MG RE SUPP: 25.0000 mg | Freq: Two times a day (BID) | RECTAL | Status: DC

## 2016-01-09 NOTE — ED Notes (Signed)
Pt. Coming from home via [facility] for bleeding from hemorrhoids. Pt. Given blood transfusion 12/30/2015 for hemoglobin 7.0. Pt. Seen at [facility] long 2 days ago for same complaint with hemoglobin 8.6 and discharged. Pt. Reports episode of bleeding this morning 0700 and again at 1000 when he called EMS. Pt. Aox4 and ambulatory on scene.

## 2016-01-09 NOTE — ED Notes (Signed)
Pt. Assisted by RN to use urinal. RN noticed blood in brief. EDP made aware.

## 2016-01-09 NOTE — Discharge Instructions (Signed)
Get plenty of rest, drink a lot of fluids. Try to increase the fiber in your diet. Consider starting Colace, twice a day, next week after the steroid suppository treatment has completed. Return here, if needed, for problems.   Hemorrhoids Hemorrhoids are swollen veins around the rectum or anus. There are two types of hemorrhoids:   Internal hemorrhoids. These occur in the veins just inside the rectum. They may poke through to the outside and become irritated and painful.  External hemorrhoids. These occur in the veins outside the anus and can be felt as a painful swelling or hard lump near the anus. CAUSES  Pregnancy.   Obesity.   Constipation or diarrhea.   Straining to have a bowel movement.   Sitting for long periods on the toilet.  Heavy lifting or other activity that caused you to strain.  Anal intercourse. SYMPTOMS   Pain.   Anal itching or irritation.   Rectal bleeding.   Fecal leakage.   Anal swelling.   One or more lumps around the anus.  DIAGNOSIS  Your caregiver may be able to diagnose hemorrhoids by visual examination. Other examinations or tests that may be performed include:   Examination of the rectal area with a gloved hand (digital rectal exam).   Examination of anal canal using a small tube (scope).   A blood test if you have lost a significant amount of blood.  A test to look inside the colon (sigmoidoscopy or colonoscopy). TREATMENT Most hemorrhoids can be treated at home. However, if symptoms do not seem to be getting better or if you have a lot of rectal bleeding, your caregiver may perform a procedure to help make the hemorrhoids get smaller or remove them completely. Possible treatments include:   Placing a rubber band at the base of the hemorrhoid to cut off the circulation (rubber band ligation).   Injecting a chemical to shrink the hemorrhoid (sclerotherapy).   Using a tool to burn the hemorrhoid (infrared light  therapy).   Surgically removing the hemorrhoid (hemorrhoidectomy).   Stapling the hemorrhoid to block blood flow to the tissue (hemorrhoid stapling).  HOME CARE INSTRUCTIONS   Eat foods with fiber, such as whole grains, beans, nuts, fruits, and vegetables. Ask your doctor about taking products with added fiber in them (fibersupplements).  Increase fluid intake. Drink enough water and fluids to keep your urine clear or pale yellow.   Exercise regularly.   Go to the bathroom when you have the urge to have a bowel movement. Do not wait.   Avoid straining to have bowel movements.   Keep the anal area dry and clean. Use wet toilet paper or moist towelettes after a bowel movement.   Medicated creams and suppositories may be used or applied as directed.   Only take over-the-counter or prescription medicines as directed by your caregiver.   Take warm sitz baths for 15-20 minutes, 3-4 times a day to ease pain and discomfort.   Place ice packs on the hemorrhoids if they are tender and swollen. Using ice packs between sitz baths may be helpful.   Put ice in a plastic bag.   Place a towel between your skin and the bag.   Leave the ice on for 15-20 minutes, 3-4 times a day.   Do not use a donut-shaped pillow or sit on the toilet for long periods. This increases blood pooling and pain.  SEEK MEDICAL CARE IF:  You have increasing pain and swelling that is not controlled by  treatment or medicine.  You have uncontrolled bleeding.  You have difficulty or you are unable to have a bowel movement.  You have pain or inflammation outside the area of the hemorrhoids. MAKE SURE YOU:  Understand these instructions.  Will watch your condition.  Will get help right away if you are not doing well or get worse.   This information is not intended to replace advice given to you by your health care provider. Make sure you discuss any questions you have with your health care  provider.   Document Released: 06/23/2000 Document Revised: 06/12/2012 Document Reviewed: 04/30/2012 Elsevier Interactive Patient Education 2016 Elsevier Inc.  High-Fiber Diet Fiber, also called dietary fiber, is a type of carbohydrate found in fruits, vegetables, whole grains, and beans. A high-fiber diet can have many health benefits. Your health care provider may recommend a high-fiber diet to help:  Prevent constipation. Fiber can make your bowel movements more regular.  Lower your cholesterol.  Relieve hemorrhoids, uncomplicated diverticulosis, or irritable bowel syndrome.  Prevent overeating as part of a weight-loss plan.  Prevent heart disease, type 2 diabetes, and certain cancers. WHAT IS MY PLAN? The recommended daily intake of fiber includes:  38 grams for men under age 31.  25 grams for men over age 19.  80 grams for women under age 25.  4 grams for women over age 58. You can get the recommended daily intake of dietary fiber by eating a variety of fruits, vegetables, grains, and beans. Your health care provider may also recommend a fiber supplement if it is not possible to get enough fiber through your diet. WHAT DO I NEED TO KNOW ABOUT A HIGH-FIBER DIET?  Fiber supplements have not been widely studied for their effectiveness, so it is better to get fiber through food sources.  Always check the fiber content on thenutrition facts label of any prepackaged food. Look for foods that contain at least 5 grams of fiber per serving.  Ask your dietitian if you have questions about specific foods that are related to your condition, especially if those foods are not listed in the following section.  Increase your daily fiber consumption gradually. Increasing your intake of dietary fiber too quickly may cause bloating, cramping, or gas.  Drink plenty of water. Water helps you to digest fiber. WHAT FOODS CAN I EAT? Grains Whole-grain breads. Multigrain cereal. Oats and  oatmeal. Brown rice. Barley. Bulgur wheat. [place]. Bran muffins. Popcorn. Rye wafer crackers. Vegetables Sweet potatoes. Spinach. Kale. Artichokes. Cabbage. Broccoli. Green peas. Carrots. Squash. Fruits Berries. Pears. Apples. Oranges. Avocados. Prunes and raisins. Dried figs. Meats and Other Protein Sources [place], kidney, pinto, and soy beans. Split peas. Lentils. Nuts and seeds. Dairy Fiber-fortified yogurt. Beverages Fiber-fortified soy milk. Fiber-fortified orange juice. Other Fiber bars. The items listed above may not be a complete list of recommended foods or beverages. Contact your dietitian for more options. WHAT FOODS ARE NOT RECOMMENDED? Grains White bread. Pasta made with refined flour. White rice. Vegetables Fried potatoes. Canned vegetables. Well-cooked vegetables.  Fruits Fruit juice. Cooked, strained fruit. Meats and Other Protein Sources Fatty cuts of meat. Fried Sales executive or fried fish. Dairy Milk. Yogurt. Cream cheese. Sour cream. Beverages Soft drinks. Other Cakes and pastries. Butter and oils. The items listed above may not be a complete list of foods and beverages to avoid. Contact your dietitian for more information. WHAT ARE SOME TIPS FOR INCLUDING HIGH-FIBER FOODS IN MY DIET?  Eat a wide variety of high-fiber foods.  Make sure  that half of all grains consumed each day are whole grains.  Replace breads and cereals made from refined flour or white flour with whole-grain breads and cereals.  Replace white rice with brown rice, bulgur wheat, or millet.  Start the day with a breakfast that is high in fiber, such as a cereal that contains at least 5 grams of fiber per serving.  Use beans in place of meat in soups, salads, or pasta.  Eat high-fiber snacks, such as berries, raw vegetables, nuts, or popcorn.   This information is not intended to replace advice given to you by your health care provider. Make sure you discuss any questions you have with your  health care provider.   Document Released: 06/26/2005 Document Revised: 07/17/2014 Document Reviewed: 12/09/2013 Elsevier Interactive Patient Education [employer].

## 2016-01-09 NOTE — ED Provider Notes (Signed)
CSN: [id number]     Arrival date & time 01/09/16  1053 History   First MD Initiated Contact with Patient 01/09/16 1116     Chief Complaint  Patient presents with  . Rectal Bleeding     (Consider location/radiation/quality/duration/timing/severity/associated sxs/prior Treatment) HPI   Anthony Hutchinson is a 82 y.o. male who presents for evaluation of intermittent rectal bleeding associated with anal hemorrhoids. He sometimes notices bleeding with wiping, and occasionally has bleeding, spontaneously. He last saw a surgeon about it, December 2016, and was previously seen by GI for the same issue, in September 2016. He denies abdominal pain, back pain, weakness, dizziness, nausea or vomiting. He did receive a blood transfusion 2 weeks ago for myelodysplastic disorder. He is not currently treating himself with anything for hemorrhoids. He does get constipated intermittently. There are no other known modifying factors.   Past Medical History  Diagnosis Date  . Hypertension   . LBBB (left bundle branch block)   . Diabetes mellitus without complication ([place])   . Cancer ([place])   . Hypothyroidism     nodule  . Cataracts, bilateral   . Anemia     iron deficiency anemia  . Inguinal hernia   . Peripheral vascular disease ([place])     Peripheral neuropathy  . Complete heart block ([place])   . MDS (myelodysplastic syndrome) ([place]) 01/05/2014   Past Surgical History  Procedure Laterality Date  . Tonsillectomy    . Hiatal hernia repair    . Hernia repair  1979    bilateral inguinal hernia  . Thyroid lobectomy  6/85    right  . Acromionectomy  1998    rotator cuff repair  . Prostate surgery    . Abdominal aortic aneurysm repair    . Tonsillectomy    . Flexible sigmoidoscopy N/A 12/13/2012    Procedure: FLEXIBLE SIGMOIDOSCOPY;  Surgeon: Beryle Beams, MD;  Location: [facility] ENDOSCOPY;  Service: Endoscopy;  Laterality: N/A;  . Hot hemostasis N/A 12/13/2012    Procedure: HOT HEMOSTASIS (ARGON PLASMA  COAGULATION/BICAP);  Surgeon: Beryle Beams, MD;  Location: [facility];  Service: Endoscopy;  Laterality: N/A;  . Pacemaker insertion  11-03-13    STJ dual chamber pacemaker implanted by Dr Lovena Le for CHB  . Permanent pacemaker insertion N/A 11/03/2013    Procedure: PERMANENT PACEMAKER INSERTION;  Surgeon: Evans Lance, MD;  Location: [facility] CATH LAB;  Service: Cardiovascular;  Laterality: N/A;  . Flexible sigmoidoscopy N/A 03/12/2015    Procedure: FLEXIBLE SIGMOIDOSCOPY;  Surgeon: Carol Ada, MD;  Location: [facility] ENDOSCOPY;  Service: Endoscopy;  Laterality: N/A;  will need APC   . Hot hemostasis N/A 03/12/2015    Procedure: HOT HEMOSTASIS (ARGON PLASMA COAGULATION/BICAP);  Surgeon: Carol Ada, MD;  Location: [facility] ENDOSCOPY;  Service: Endoscopy;  Laterality: N/A;   Family History  Problem Relation Age of Onset  . Heart disease Mother   . Heart disease Father   . Heart disease Son   . Hyperlipidemia Son    Social History  Substance Use Topics  . Smoking status: Former Smoker    Types: Pipe    Quit date: 11/27/1963  . Smokeless tobacco: Never Used  . Alcohol Use: No    Review of Systems  All other systems reviewed and are negative.     Allergies  Codeine sulfate and Penicillins  Home Medications   Prior to Admission medications   Medication Sig Start Date End Date Taking? Authorizing Provider  acetaminophen (TYLENOL) 500 MG tablet Take 500  mg by mouth every 6 (six) hours as needed (pain).   Yes Historical Provider, MD  alfuzosin (UROXATRAL) 10 MG 24 hr tablet Take 10 mg by mouth every evening.    Yes Historical Provider, MD  aminocaproic acid (AMICAR) 500 MG tablet Take 1 tablet (500 mg total) by mouth every 8 (eight) hours as needed for bleeding. 11/26/15  Yes Heath Lark, MD  calcium carbonate (TUMS - DOSED IN MG ELEMENTAL CALCIUM) 500 MG chewable tablet Chew 1 tablet by mouth daily as needed for indigestion or heartburn.   Yes Historical Provider, MD  furosemide (LASIX) 20 MG  tablet Take 1 tablet (20 mg total) by mouth daily. 12/22/15  Yes Evans Lance, MD  loperamide (IMODIUM) 2 MG capsule Take 2 mg by mouth daily as needed for diarrhea or loose stools.   Yes Historical Provider, MD  Multiple Vitamin (MULTIVITAMIN WITH MINERALS) TABS Take 1 tablet by mouth every morning.   Yes Historical Provider, MD  pramoxine-hydrocortisone (ANALPRAM HC) cream Place 1 application rectally 4 (four) times daily.   Yes Historical Provider, MD  SYNTHROID 125 MCG tablet Take 125 mcg by mouth daily before breakfast.  09/15/14  Yes Historical Provider, MD  TOPROL XL 25 MG 24 hr tablet Take 25 mg by mouth daily. 12/24/13  Yes Historical Provider, MD  hydrocortisone (ANUSOL-HC) 25 MG suppository Place 1 suppository (25 mg total) rectally 2 (two) times daily. For 7 days 01/09/16   Daleen Bo, MD   BP 152/60 mmHg  Pulse 60  Temp(Src) 98.2 F (36.8 C) (Oral)  Resp 16  Ht 5\' 9"  (1.753 m)  Wt 169 lb (76.658 kg)  BMI 24.95 kg/m2  SpO2 99% Physical Exam  Constitutional: He is oriented to person, place, and time. He appears well-developed. No distress.  Elderly, frail  HENT:  Head: Normocephalic and atraumatic.  Right Ear: External ear normal.  Left Ear: External ear normal.  Eyes: Conjunctivae and EOM are normal. Pupils are equal, round, and reactive to light.  Neck: Normal range of motion and phonation normal. Neck supple.  Cardiovascular: Normal rate, regular rhythm and normal heart sounds.   Pulmonary/Chest: Effort normal and breath sounds normal. He exhibits no bony tenderness.  Abdominal: Soft. There is no tenderness.  Genitourinary:  External hemorrhoids, mostly left-sided, they are soft. There is a small amount of bright red blood on the perianal tissues. This blood appears to have come from the hemorrhoids. On internal exam, there is a small amount of soft brown rectal stool. There are no palpable internal hemorrhoids. There is no palpable rectal mass.  Musculoskeletal: Normal  range of motion.  Neurological: He is alert and oriented to person, place, and time. No cranial nerve deficit or sensory deficit. He exhibits normal muscle tone. Coordination normal.  Skin: Skin is warm, dry and intact.  Psychiatric: He has a normal mood and affect. His behavior is normal. Judgment and thought content normal.  Nursing note and vitals reviewed.   ED Course  Procedures (including critical care time)  Medications - No data to display  Patient Vitals for the past 24 hrs:  BP Temp Temp src Pulse Resp SpO2 Height Weight  01/09/16 1502 152/60 mmHg - - 60 16 99 % - -  01/09/16 1330 (!) 141/52 mmHg - - 62 15 97 % - -  01/09/16 1230 146/59 mmHg - - 65 - 99 % - -  01/09/16 1145 143/57 mmHg - - 62 - 98 % - -  01/09/16 1101 - 98.2  F (36.8 C) Oral 75 18 99 % 5\' 9"  (1.753 m) 169 lb (76.658 kg)    At discharge- Reevaluation with update and discussion. After initial assessment and treatment, an updated evaluation reveals he remains comfortable. Findings discussed with patient, and son, all questions were answered. [place] Review Labs Reviewed  CBC WITH DIFFERENTIAL/PLATELET - Abnormal; Notable for the following:    RBC 2.51 (*)    Hemoglobin 7.4 (*)    HCT 24.0 (*)    RDW 22.9 (*)    Platelets 70 (*)    All other components within normal limits    Imaging Review No results found. I have personally reviewed and evaluated these images and lab results as part of my medical decision-making.   EKG Interpretation None      MDM   Final diagnoses:  Hemorrhoids, unspecified hemorrhoid type    Hemorrhoids, with mild ongoing blood loss. Patient with known myelodysplastic disorder receiving frequent transfusions over the last several weeks. He is being followed closely by his hematologist, with planned reassessment, in 4 days time. He does not currently require immediate transfusion. The external hemorrhoids cannot be treated with incision and drainage at this  time, and can be managed expectantly. Patient can therefore be discharged with symptomatic treatment, close monitoring. Will give trial of hydrocortisone suppositories, to attempt to shrink hemorrhoids, and thereby improve the bleeding.  Nursing Notes Reviewed/ Care Coordinated Applicable Imaging Reviewed Interpretation of Laboratory Data incorporated into ED treatment  The patient appears reasonably screened and/or stabilized for discharge and I doubt any other medical condition or other [facility] requiring further screening, evaluation, or treatment in the ED at this time prior to discharge.  Plan: Home Medications- hydrocortisone suppositories 1 week; Home Treatments- rest, fluids; return here if the recommended treatment, does not improve the symptoms; Recommended follow up- contact hematology tomorrow by phone, to consider transfusion, prior to planned reassessment in 4 days time. Consider general surgery or PCP follow-up regarding the hemorrhoids.    Daleen Bo, MD 01/09/16 1620

## 2016-01-13 ENCOUNTER — Encounter (HOSPITAL_COMMUNITY): Payer: Self-pay

## 2016-01-13 ENCOUNTER — Ambulatory Visit (HOSPITAL_BASED_OUTPATIENT_CLINIC_OR_DEPARTMENT_OTHER): Payer: Medicare Other

## 2016-01-13 ENCOUNTER — Ambulatory Visit: Payer: Medicare Other

## 2016-01-13 ENCOUNTER — Other Ambulatory Visit (HOSPITAL_BASED_OUTPATIENT_CLINIC_OR_DEPARTMENT_OTHER): Payer: Medicare Other

## 2016-01-13 ENCOUNTER — Observation Stay (HOSPITAL_COMMUNITY)
Admission: EM | Admit: 2016-01-13 | Discharge: 2016-01-15 | Disposition: A | Discharge status: Home / Self Care | Payer: Medicare Other | Attending: Internal Medicine | Admitting: Internal Medicine

## 2016-01-13 ENCOUNTER — Other Ambulatory Visit: Payer: Self-pay | Admitting: Hematology and Oncology

## 2016-01-13 ENCOUNTER — Ambulatory Visit (HOSPITAL_COMMUNITY)
Admission: RE | Admit: 2016-01-13 | Discharge: 2016-01-13 | Disposition: A | Discharge status: Home / Self Care | Payer: Medicare Other | Source: Ambulatory Visit | Attending: Hematology and Oncology | Admitting: Hematology and Oncology

## 2016-01-13 VITALS — BP 151/60 | HR 59 | Temp 97.9°F | Resp 18

## 2016-01-13 DIAGNOSIS — Z950 Presence of cardiac pacemaker: Secondary | ICD-10-CM | POA: Diagnosis not present

## 2016-01-13 DIAGNOSIS — Z923 Personal history of irradiation: Secondary | ICD-10-CM | POA: Insufficient documentation

## 2016-01-13 DIAGNOSIS — D509 Iron deficiency anemia, unspecified: Secondary | ICD-10-CM | POA: Insufficient documentation

## 2016-01-13 DIAGNOSIS — I442 Atrioventricular block, complete: Secondary | ICD-10-CM | POA: Diagnosis not present

## 2016-01-13 DIAGNOSIS — E039 Hypothyroidism, unspecified: Secondary | ICD-10-CM | POA: Diagnosis not present

## 2016-01-13 DIAGNOSIS — K644 Residual hemorrhoidal skin tags: Secondary | ICD-10-CM | POA: Diagnosis not present

## 2016-01-13 DIAGNOSIS — E1140 Type 2 diabetes mellitus with diabetic neuropathy, unspecified: Secondary | ICD-10-CM | POA: Insufficient documentation

## 2016-01-13 DIAGNOSIS — D6959 Other secondary thrombocytopenia: Secondary | ICD-10-CM | POA: Insufficient documentation

## 2016-01-13 DIAGNOSIS — I714 Abdominal aortic aneurysm, without rupture, unspecified: Secondary | ICD-10-CM | POA: Diagnosis present

## 2016-01-13 DIAGNOSIS — K921 Melena: Principal | ICD-10-CM | POA: Insufficient documentation

## 2016-01-13 DIAGNOSIS — D469 Myelodysplastic syndrome, unspecified: Secondary | ICD-10-CM

## 2016-01-13 DIAGNOSIS — K648 Other hemorrhoids: Secondary | ICD-10-CM | POA: Insufficient documentation

## 2016-01-13 DIAGNOSIS — D631 Anemia in chronic kidney disease: Secondary | ICD-10-CM

## 2016-01-13 DIAGNOSIS — Z87891 Personal history of nicotine dependence: Secondary | ICD-10-CM | POA: Diagnosis not present

## 2016-01-13 DIAGNOSIS — N183 Chronic kidney disease, stage 3 unspecified: Secondary | ICD-10-CM | POA: Diagnosis present

## 2016-01-13 DIAGNOSIS — Z79899 Other long term (current) drug therapy: Secondary | ICD-10-CM | POA: Diagnosis not present

## 2016-01-13 DIAGNOSIS — K625 Hemorrhage of anus and rectum: Secondary | ICD-10-CM | POA: Diagnosis present

## 2016-01-13 DIAGNOSIS — E1151 Type 2 diabetes mellitus with diabetic peripheral angiopathy without gangrene: Secondary | ICD-10-CM | POA: Diagnosis not present

## 2016-01-13 DIAGNOSIS — I5022 Chronic systolic (congestive) heart failure: Secondary | ICD-10-CM | POA: Insufficient documentation

## 2016-01-13 DIAGNOSIS — I130 Hypertensive heart and chronic kidney disease with heart failure and stage 1 through stage 4 chronic kidney disease, or unspecified chronic kidney disease: Secondary | ICD-10-CM | POA: Insufficient documentation

## 2016-01-13 DIAGNOSIS — D696 Thrombocytopenia, unspecified: Secondary | ICD-10-CM | POA: Diagnosis present

## 2016-01-13 DIAGNOSIS — E1122 Type 2 diabetes mellitus with diabetic chronic kidney disease: Secondary | ICD-10-CM | POA: Diagnosis not present

## 2016-01-13 DIAGNOSIS — D630 Anemia in neoplastic disease: Secondary | ICD-10-CM

## 2016-01-13 LAB — CBC & DIFF AND RETIC
BASO%: 0.3 % (ref 0.0–2.0)
BASOS ABS: 0 10*3/uL (ref 0.0–0.1)
EOS%: 2.5 % (ref 0.0–7.0)
Eosinophils Absolute: 0.2 10*3/uL (ref 0.0–0.5)
HCT: 25.6 % — ABNORMAL LOW (ref 38.4–49.9)
HEMOGLOBIN: 8.2 g/dL — AB (ref 13.0–17.1)
Immature Retic Fract: 7.8 % (ref 3.00–10.60)
LYMPH#: 1 10*3/uL (ref 0.9–3.3)
LYMPH%: 17.1 % (ref 14.0–49.0)
MCH: 29.8 pg (ref 27.2–33.4)
MCHC: 32 g/dL (ref 32.0–36.0)
MCV: 93.1 fL (ref 79.3–98.0)
MONO#: 0.6 10*3/uL (ref 0.1–0.9)
MONO%: 9.5 % (ref 0.0–14.0)
NEUT#: 4.3 10*3/uL (ref 1.5–6.5)
NEUT%: 70.6 % (ref 39.0–75.0)
NRBC: 0 % (ref 0–0)
Platelets: 51 10*3/uL — ABNORMAL LOW (ref 140–400)
RBC: 2.75 10*6/uL — AB (ref 4.20–5.82)
RDW: 23 % — AB (ref 11.0–14.6)
RETIC %: 2.44 % — AB (ref 0.80–1.80)
Retic Ct Abs: 67.1 10*3/uL (ref 34.80–93.90)
WBC: 6.1 10*3/uL (ref 4.0–10.3)

## 2016-01-13 LAB — COMPREHENSIVE METABOLIC PANEL
ALK PHOS: 202 U/L — AB (ref 38–126)
ALT: 40 U/L (ref 17–63)
AST: 55 U/L — ABNORMAL HIGH (ref 15–41)
Albumin: 3.4 g/dL — ABNORMAL LOW (ref 3.5–5.0)
Anion gap: 5 (ref 5–15)
BUN: 36 mg/dL — ABNORMAL HIGH (ref 6–20)
CALCIUM: 8.5 mg/dL — AB (ref 8.9–10.3)
CO2: 24 mmol/L (ref 22–32)
CREATININE: 2.11 mg/dL — AB (ref 0.61–1.24)
Chloride: 107 mmol/L (ref 101–111)
GFR calc non Af Amer: 26 mL/min — ABNORMAL LOW (ref >60)
GFR, EST AFRICAN AMERICAN: 30 mL/min — AB (ref >60)
GLUCOSE: 124 mg/dL — AB (ref 65–99)
Potassium: 4.1 mmol/L (ref 3.5–5.1)
SODIUM: 136 mmol/L (ref 135–145)
Total Bilirubin: 1.3 mg/dL — ABNORMAL HIGH (ref 0.3–1.2)
Total Protein: 6.4 g/dL — ABNORMAL LOW (ref 6.5–8.1)

## 2016-01-13 LAB — CBC WITH DIFFERENTIAL/PLATELET
Basophils Absolute: 0 10*3/uL (ref 0.0–0.1)
Basophils Relative: 0 %
EOS ABS: 0.1 10*3/uL (ref 0.0–0.7)
Eosinophils Relative: 2 %
HCT: 27.1 % — ABNORMAL LOW (ref 39.0–52.0)
HEMOGLOBIN: 8.9 g/dL — AB (ref 13.0–17.0)
Lymphocytes Relative: 14 %
Lymphs Abs: 0.9 10*3/uL (ref 0.7–4.0)
MCH: 30.2 pg (ref 26.0–34.0)
MCHC: 32.8 g/dL (ref 30.0–36.0)
MCV: 91.9 fL (ref 78.0–100.0)
Monocytes Absolute: 0.7 10*3/uL (ref 0.1–1.0)
Monocytes Relative: 10 %
Neutro Abs: 4.8 10*3/uL (ref 1.7–7.7)
Neutrophils Relative %: 74 %
Platelets: 43 10*3/uL — ABNORMAL LOW (ref 150–400)
RBC: 2.95 MIL/uL — ABNORMAL LOW (ref 4.22–5.81)
RDW: 21.6 % — ABNORMAL HIGH (ref 11.5–15.5)
WBC: 6.5 10*3/uL (ref 4.0–10.5)

## 2016-01-13 LAB — POC OCCULT BLOOD, ED: FECAL OCCULT BLD: POSITIVE — AB

## 2016-01-13 LAB — TECHNOLOGIST REVIEW

## 2016-01-13 LAB — PREPARE RBC (CROSSMATCH)

## 2016-01-13 MED ORDER — SODIUM CHLORIDE 0.9 % IV SOLN
250.0000 mL | Freq: Once | INTRAVENOUS | Status: AC
  Administered 2016-01-13: 250 mL via INTRAVENOUS

## 2016-01-13 MED ORDER — DIPHENHYDRAMINE HCL 25 MG PO CAPS
ORAL_CAPSULE | ORAL | Status: AC
  Filled 2016-01-13: qty 1

## 2016-01-13 MED ORDER — ACETAMINOPHEN 325 MG PO TABS
650.0000 mg | ORAL_TABLET | Freq: Once | ORAL | Status: AC
Start: 2016-01-13 — End: 2016-01-13
  Administered 2016-01-13: 650 mg via ORAL

## 2016-01-13 MED ORDER — ACETAMINOPHEN 325 MG PO TABS
ORAL_TABLET | ORAL | Status: AC
  Filled 2016-01-13: qty 2

## 2016-01-13 MED ORDER — DIPHENHYDRAMINE HCL 25 MG PO CAPS
12.5000 mg | ORAL_CAPSULE | Freq: Once | ORAL | Status: AC
  Administered 2016-01-13: 25 mg via ORAL

## 2016-01-13 MED ORDER — DARBEPOETIN ALFA 500 MCG/ML IJ SOSY
500.0000 ug | PREFILLED_SYRINGE | Freq: Once | INTRAMUSCULAR | Status: AC
  Administered 2016-01-13: 500 ug via SUBCUTANEOUS
  Filled 2016-01-13: qty 1

## 2016-01-13 NOTE — Progress Notes (Signed)
Completed in Infusion Room

## 2016-01-13 NOTE — ED Notes (Addendum)
Patient c/o Hemorid  bleeding that began today.  Patient has a Hx of the same.  Per EMS patient was [facility] and was transfused.  Per EMS patient began having large amounts of blood loss.  Per EMS patient sitting on a towel on their arrival.  Per EMS patient was not straining when bleeding began.  Per EMS 20 g in [place].  Per EMS patient not expressing any pain.

## 2016-01-13 NOTE — ED Notes (Signed)
Bed: [id number] Expected date:  Expected time:  Means of arrival:  Comments: Rectal bleeding

## 2016-01-13 NOTE — Patient Instructions (Signed)

## 2016-01-13 NOTE — ED Provider Notes (Signed)
CSN: [id number]     Arrival date & time 01/13/16  1926 History   First MD Initiated Contact with Patient 01/13/16 2022     Chief Complaint  Patient presents with  . Rectal Bleeding     (Consider location/radiation/quality/duration/timing/severity/associated sxs/prior Treatment) HPI Anthony Hutchinson is a 82 y.o. male with PMH significant for HTN, DM, MDS BIB EMS who presents with sudden onset, now resolved, rectal bleeding that occurred around 5 PM this evening.  Patient states he was getting something out of the kitchen when he felt blood running down his leg.  Family member states there was a trail of blood in the kitchen.  Patient and family member states this has happened multiple times over the years and is characteristic of his hemorrhoids.  Patient reports a history of anal hemorrhoids, and states this has been ongoing over the last couple of years.  He was seen in ED 01/09/16 for this, he was hemodynamically stable and discharged home.  He is receiving treatment at the [facility] for MDS.  In fact, he received a transfusion today of PRBCs along with his injection of aranesp. He denies syncope, dizziness, lightheadedness, CP, SOB, abdominal pain, rectal pain.  Has been having normal BMs and taking Colace and using suppositories. 2-3 BMs today.  Past Medical History  Diagnosis Date  . Hypertension   . LBBB (left bundle branch block)   . Diabetes mellitus without complication ([place])   . Cancer ([place])   . Hypothyroidism     nodule  . Cataracts, bilateral   . Anemia     iron deficiency anemia  . Inguinal hernia   . Peripheral vascular disease ([place])     Peripheral neuropathy  . Complete heart block ([place])   . MDS (myelodysplastic syndrome) ([place]) 01/05/2014   Past Surgical History  Procedure Laterality Date  . Tonsillectomy    . Hiatal hernia repair    . Hernia repair  1979    bilateral inguinal hernia  . Thyroid lobectomy  6/85    right  . Acromionectomy  1998    rotator cuff repair   . Prostate surgery    . Abdominal aortic aneurysm repair    . Tonsillectomy    . Flexible sigmoidoscopy N/A 12/13/2012    Procedure: FLEXIBLE SIGMOIDOSCOPY;  Surgeon: Beryle Beams, MD;  Location: [facility] ENDOSCOPY;  Service: Endoscopy;  Laterality: N/A;  . Hot hemostasis N/A 12/13/2012    Procedure: HOT HEMOSTASIS (ARGON PLASMA COAGULATION/BICAP);  Surgeon: Beryle Beams, MD;  Location: [facility];  Service: Endoscopy;  Laterality: N/A;  . Pacemaker insertion  11-03-13    STJ dual chamber pacemaker implanted by Dr Lovena Le for CHB  . Permanent pacemaker insertion N/A 11/03/2013    Procedure: PERMANENT PACEMAKER INSERTION;  Surgeon: Evans Lance, MD;  Location: [facility] CATH LAB;  Service: Cardiovascular;  Laterality: N/A;  . Flexible sigmoidoscopy N/A 03/12/2015    Procedure: FLEXIBLE SIGMOIDOSCOPY;  Surgeon: Carol Ada, MD;  Location: [facility] ENDOSCOPY;  Service: Endoscopy;  Laterality: N/A;  will need APC   . Hot hemostasis N/A 03/12/2015    Procedure: HOT HEMOSTASIS (ARGON PLASMA COAGULATION/BICAP);  Surgeon: Carol Ada, MD;  Location: [facility]OS[facility];  Service: Endoscopy;  Laterality: N/A;   Family History  Problem Relation Age of Onset  . Heart disease Mother   . Heart disease Father   . Heart disease Son   . Hyperlipidemia Son    Social History  Substance Use Topics  . Smoking status: Former Smoker  Types: Pipe    Quit date: 11/27/1963  . Smokeless tobacco: Never Used  . Alcohol Use: No    Review of Systems All other systems negative unless otherwise stated in HPI    Allergies  Codeine sulfate and Penicillins  Home Medications   Prior to Admission medications   Medication Sig Start Date End Date Taking? Authorizing Provider  acetaminophen (TYLENOL) 500 MG tablet Take 500 mg by mouth every 6 (six) hours as needed (pain).   Yes Historical Provider, MD  alfuzosin (UROXATRAL) 10 MG 24 hr tablet Take 10 mg by mouth every evening.    Yes Historical Provider, MD  aminocaproic acid  (AMICAR) 500 MG tablet Take 1 tablet (500 mg total) by mouth every 8 (eight) hours as needed for bleeding. 11/26/15  Yes Heath Lark, MD  calcium carbonate (TUMS - DOSED IN MG ELEMENTAL CALCIUM) 500 MG chewable tablet Chew 1 tablet by mouth daily as needed for indigestion or heartburn.   Yes Historical Provider, MD  docusate sodium (COLACE) 100 MG capsule Take 100 mg by mouth 2 (two) times daily.   Yes Historical Provider, MD  furosemide (LASIX) 20 MG tablet Take 1 tablet (20 mg total) by mouth daily. 12/22/15  Yes Evans Lance, MD  hydrocortisone (ANUSOL-HC) 25 MG suppository Place 1 suppository (25 mg total) rectally 2 (two) times daily. For 7 days 01/09/16  Yes Daleen Bo, MD  loperamide (IMODIUM) 2 MG capsule Take 2 mg by mouth daily as needed for diarrhea or loose stools.   Yes Historical Provider, MD  Multiple Vitamin (MULTIVITAMIN WITH MINERALS) TABS Take 1 tablet by mouth every morning.   Yes Historical Provider, MD  pramoxine-hydrocortisone (ANALPRAM HC) cream Place 1 application rectally 4 (four) times daily.   Yes Historical Provider, MD  SYNTHROID 125 MCG tablet Take 125 mcg by mouth daily before breakfast.  09/15/14  Yes Historical Provider, MD  TOPROL XL 25 MG 24 hr tablet Take 25 mg by mouth daily. 12/24/13  Yes Historical Provider, MD   BP 148/60 mmHg  Pulse 59  Temp(Src) 98.1 F (36.7 C) (Oral)  Resp 16  SpO2 97% Physical Exam  Constitutional: He is oriented to person, place, and time. He appears well-developed and well-nourished.  Non-toxic appearance. He does not have a sickly appearance. He does not appear ill.  HENT:  Head: Normocephalic and atraumatic.  Mouth/Throat: Oropharynx is clear and moist.  Eyes: Conjunctivae are normal.  Neck: Normal range of motion. Neck supple.  Cardiovascular: Normal rate and regular rhythm.   Pulmonary/Chest: Effort normal and breath sounds normal. No accessory muscle usage or stridor. No respiratory distress. He has no wheezes. He has no  rhonchi. He has no rales.  Abdominal: Soft. Bowel sounds are normal. He exhibits no distension. There is no tenderness.  Genitourinary:  External hemorrhoids (L>R).  Scant blood on perianal tissues.  Small amount of soft brown stool in rectal vault.  Musculoskeletal: Normal range of motion.  Lymphadenopathy:    He has no cervical adenopathy.  Neurological: He is alert and oriented to person, place, and time.  Speech clear without dysarthria.  Skin: Skin is warm and dry.  Psychiatric: He has a normal mood and affect. His behavior is normal.    ED Course  Procedures (including critical care time) Labs Review Labs Reviewed  CBC WITH DIFFERENTIAL/PLATELET - Abnormal; Notable for the following:    RBC 2.95 (*)    Hemoglobin 8.9 (*)    HCT 27.1 (*)    RDW  21.6 (*)    Platelets 43 (*)    All other components within normal limits  POC OCCULT BLOOD, ED - Abnormal; Notable for the following:    Fecal Occult Bld POSITIVE (*)    All other components within normal limits  COMPREHENSIVE METABOLIC PANEL    Imaging Review No results found. I have personally reviewed and evaluated these images and lab results as part of my medical decision-making.   EKG Interpretation None      MDM   Final diagnoses:  External hemorrhoids  Rectal bleeding   Patient presents with hemorrhoids with rectal bleeding. Patient has long-standing history of hemorrhoids. Patient states he was standing in the kitchen when blood started running down his leg. He was in a pool of blood. Patient just received blood transfusion today at the cancer center. Hemoglobin 8.9 today, PLT 49. On exam, he does have hemorrhoids. They're nonthrombosed. There is scant blood on the perianal tissues. He is hemodynamically stable. Upon recheck, patient is sitting in a pool of blood again.  Appears to be from hemorrhoids.  At this time, I do not feel patient is safe to discharge home.  Plan to admit to medicine for rectal bleeding and  blood count monitoring.    Case has been discussed with and seen by Dr. Johnney Killian who agrees with the above plan for admission.       Gloriann Loan, PA-C 01/14/16 0005  Charlesetta Shanks, MD 01/14/16 [phone]

## 2016-01-13 NOTE — ED Notes (Signed)
Assisted patient to use urinal.

## 2016-01-14 ENCOUNTER — Encounter (HOSPITAL_COMMUNITY): Payer: Self-pay | Admitting: Family Medicine

## 2016-01-14 DIAGNOSIS — D696 Thrombocytopenia, unspecified: Secondary | ICD-10-CM

## 2016-01-14 DIAGNOSIS — K648 Other hemorrhoids: Secondary | ICD-10-CM | POA: Diagnosis not present

## 2016-01-14 DIAGNOSIS — I5022 Chronic systolic (congestive) heart failure: Secondary | ICD-10-CM | POA: Diagnosis not present

## 2016-01-14 DIAGNOSIS — D469 Myelodysplastic syndrome, unspecified: Secondary | ICD-10-CM

## 2016-01-14 DIAGNOSIS — K625 Hemorrhage of anus and rectum: Secondary | ICD-10-CM | POA: Diagnosis not present

## 2016-01-14 DIAGNOSIS — E039 Hypothyroidism, unspecified: Secondary | ICD-10-CM | POA: Diagnosis present

## 2016-01-14 DIAGNOSIS — E038 Other specified hypothyroidism: Secondary | ICD-10-CM | POA: Diagnosis not present

## 2016-01-14 DIAGNOSIS — N183 Chronic kidney disease, stage 3 (moderate): Secondary | ICD-10-CM

## 2016-01-14 DIAGNOSIS — K644 Residual hemorrhoidal skin tags: Secondary | ICD-10-CM | POA: Insufficient documentation

## 2016-01-14 LAB — CBC
HEMATOCRIT: 25.9 % — AB (ref 39.0–52.0)
HEMOGLOBIN: 8.5 g/dL — AB (ref 13.0–17.0)
MCH: 30.4 pg (ref 26.0–34.0)
MCHC: 32.8 g/dL (ref 30.0–36.0)
MCV: 92.5 fL (ref 78.0–100.0)
Platelets: 52 10*3/uL — ABNORMAL LOW (ref 150–400)
RBC: 2.8 MIL/uL — AB (ref 4.22–5.81)
RDW: 22.1 % — ABNORMAL HIGH (ref 11.5–15.5)
WBC: 5.2 10*3/uL (ref 4.0–10.5)

## 2016-01-14 LAB — TYPE AND SCREEN
ABO/RH(D): A POS
ABO/RH(D): A POS
ANTIBODY SCREEN: NEGATIVE
Antibody Screen: NEGATIVE
Unit division: 0

## 2016-01-14 LAB — BASIC METABOLIC PANEL
ANION GAP: 4 — AB (ref 5–15)
BUN: 34 mg/dL — ABNORMAL HIGH (ref 6–20)
CALCIUM: 8.3 mg/dL — AB (ref 8.9–10.3)
CO2: 25 mmol/L (ref 22–32)
Chloride: 106 mmol/L (ref 101–111)
Creatinine, Ser: 2.12 mg/dL — ABNORMAL HIGH (ref 0.61–1.24)
GFR, EST AFRICAN AMERICAN: 29 mL/min — AB (ref >60)
GFR, EST NON AFRICAN AMERICAN: 25 mL/min — AB (ref >60)
Glucose, Bld: 100 mg/dL — ABNORMAL HIGH (ref 65–99)
POTASSIUM: 3.9 mmol/L (ref 3.5–5.1)
Sodium: 135 mmol/L (ref 135–145)

## 2016-01-14 MED ORDER — FUROSEMIDE 40 MG PO TABS
20.0000 mg | ORAL_TABLET | Freq: Every day | ORAL | Status: DC
Start: 2016-01-14 — End: 2016-01-15
  Administered 2016-01-14 – 2016-01-15 (×2): 20 mg via ORAL
  Filled 2016-01-14 (×2): qty 1

## 2016-01-14 MED ORDER — CALCIUM CARBONATE ANTACID 500 MG PO CHEW: 1.0000 | CHEWABLE_TABLET | Freq: Every day | ORAL | Status: DC | PRN

## 2016-01-14 MED ORDER — HYDROCORTISONE ACE-PRAMOXINE 2.5-1 % RE CREA
1.0000 "application " | TOPICAL_CREAM | Freq: Four times a day (QID) | RECTAL | Status: DC
  Administered 2016-01-14 – 2016-01-15 (×4): 1 via RECTAL
  Filled 2016-01-14: qty 30

## 2016-01-14 MED ORDER — ONDANSETRON HCL 4 MG PO TABS: 4.0000 mg | ORAL_TABLET | Freq: Four times a day (QID) | ORAL | Status: DC | PRN

## 2016-01-14 MED ORDER — SODIUM CHLORIDE 0.9 % IV SOLN
Freq: Once | INTRAVENOUS | Status: AC
  Administered 2016-01-14: 04:00:00 via INTRAVENOUS

## 2016-01-14 MED ORDER — ALFUZOSIN HCL ER 10 MG PO TB24
10.0000 mg | ORAL_TABLET | Freq: Every evening | ORAL | Status: DC
  Administered 2016-01-14: 10 mg via ORAL
  Filled 2016-01-14 (×2): qty 1

## 2016-01-14 MED ORDER — METOPROLOL SUCCINATE ER 25 MG PO TB24
25.0000 mg | ORAL_TABLET | Freq: Every day | ORAL | Status: DC
  Administered 2016-01-14 – 2016-01-15 (×2): 25 mg via ORAL
  Filled 2016-01-14 (×2): qty 1

## 2016-01-14 MED ORDER — ADULT MULTIVITAMIN W/MINERALS CH
1.0000 | ORAL_TABLET | Freq: Every morning | ORAL | Status: DC
  Administered 2016-01-14 – 2016-01-15 (×2): 1 via ORAL
  Filled 2016-01-14 (×2): qty 1

## 2016-01-14 MED ORDER — ONDANSETRON HCL 4 MG/2ML IJ SOLN: 4.0000 mg | Freq: Four times a day (QID) | INTRAMUSCULAR | Status: DC | PRN

## 2016-01-14 MED ORDER — LEVOTHYROXINE SODIUM 125 MCG PO TABS
125.0000 ug | ORAL_TABLET | Freq: Every day | ORAL | Status: DC
  Administered 2016-01-14 – 2016-01-15 (×2): 125 ug via ORAL
  Filled 2016-01-14 (×2): qty 1

## 2016-01-14 MED ORDER — AMINOCAPROIC ACID 500 MG PO TABS
500.0000 mg | ORAL_TABLET | Freq: Three times a day (TID) | ORAL | Status: DC
  Administered 2016-01-14 – 2016-01-15 (×4): 500 mg via ORAL
  Filled 2016-01-14 (×5): qty 1

## 2016-01-14 MED ORDER — SODIUM CHLORIDE 0.9% FLUSH
3.0000 mL | Freq: Two times a day (BID) | INTRAVENOUS | Status: DC
  Administered 2016-01-14: 3 mL via INTRAVENOUS

## 2016-01-14 MED ORDER — ACETAMINOPHEN 500 MG PO TABS
500.0000 mg | ORAL_TABLET | Freq: Four times a day (QID) | ORAL | Status: DC | PRN
  Administered 2016-01-14 (×2): 500 mg via ORAL
  Filled 2016-01-14 (×2): qty 1

## 2016-01-14 NOTE — ED Notes (Signed)
Report given to [place], RN 5E

## 2016-01-14 NOTE — ED Notes (Signed)
Attempted report x1, RN unavailable  

## 2016-01-14 NOTE — H&P (Signed)
History and Physical    Anthony Hutchinson [id number] DOB: Apr 06, 1922 DOA: 01/13/2016  PCP: Merrilee Seashore, MD   Patient coming from: Home   Chief Complaint: Rectal bleeding   HPI: Anthony Hutchinson is a 82 y.o. male with medical history significant for complete heart block status post pacer, AAA status post stenting, hypertension, hypothyroidism, chronic kidney disease stage III, bleeding external hemorrhoids, and chronic anemia and thrombocytopenia suspected secondary to MDS who presents the emergency department following large volume of bright red blood per rectum. Patient reports being in his usual state of health with no abdominal pain, nausea, or vomiting. He reports recurrent rectal bleeding over the past 1-2 years which has been attributed to hemorrhoids. He is followed by oncology for management of chronic anemia and thrombocytopenia which is thought to be secondary to MDS. He receives Epogen and blood transfusions as needed. Patient was at the cancer center earlier today for transfusion of one unit of packed red blood cells. Back at home, the patient had a large volume bright red blood per rectum, painless, and came into the emergency department for evaluation of this.  ED Course: Upon arrival to the ED, patient is found to be afebrile, saturating well on room air, and with vital signs stable. CMP is notable for serum creatinine of 2.11 which appears consistent with his baseline, as well as chronic elevations in LFTs which appears stable. CBC is notable for hemoglobin of 8.9 and platelet count of 43,000 with normal MCV and WBC. FOBT was positive and the patient had a witnessed second large-volume rectal bleed. Patient has remained asymptomatic with this and hemodynamically stable, but given his active bleeding and low platelets, he will be observed on the telemetry unit for ongoing evaluation and management of rectal bleeding with thrombocytopenia.   Review of Systems:  All other systems  reviewed and apart from HPI, are negative.  Past Medical History  Diagnosis Date  . Hypertension   . LBBB (left bundle branch block)   . Diabetes mellitus without complication ([place])   . Cancer ([place])   . Hypothyroidism     nodule  . Cataracts, bilateral   . Anemia     iron deficiency anemia  . Inguinal hernia   . Peripheral vascular disease ([place])     Peripheral neuropathy  . Complete heart block ([place])   . MDS (myelodysplastic syndrome) ([place]) 01/05/2014    Past Surgical History  Procedure Laterality Date  . Tonsillectomy    . Hiatal hernia repair    . Hernia repair  1979    bilateral inguinal hernia  . Thyroid lobectomy  6/85    right  . Acromionectomy  1998    rotator cuff repair  . Prostate surgery    . Abdominal aortic aneurysm repair    . Tonsillectomy    . Flexible sigmoidoscopy N/A 12/13/2012    Procedure: FLEXIBLE SIGMOIDOSCOPY;  Surgeon: Beryle Beams, MD;  Location: [facility] ENDOSCOPY;  Service: Endoscopy;  Laterality: N/A;  . Hot hemostasis N/A 12/13/2012    Procedure: HOT HEMOSTASIS (ARGON PLASMA COAGULATION/BICAP);  Surgeon: Beryle Beams, MD;  Location: [facility];  Service: Endoscopy;  Laterality: N/A;  . Pacemaker insertion  11-03-13    STJ dual chamber pacemaker implanted by Dr Lovena Le for CHB  . Permanent pacemaker insertion N/A 11/03/2013    Procedure: PERMANENT PACEMAKER INSERTION;  Surgeon: Evans Lance, MD;  Location: [facility] CATH LAB;  Service: Cardiovascular;  Laterality: N/A;  . Flexible sigmoidoscopy N/A 03/12/2015  Procedure: FLEXIBLE SIGMOIDOSCOPY;  Surgeon: Carol Ada, MD;  Location: [facility] ENDOSCOPY;  Service: Endoscopy;  Laterality: N/A;  will need APC   . Hot hemostasis N/A 03/12/2015    Procedure: HOT HEMOSTASIS (ARGON PLASMA COAGULATION/BICAP);  Surgeon: Carol Ada, MD;  Location: [facility];  Service: Endoscopy;  Laterality: N/A;     reports that he quit smoking about 52 years ago. His smoking use included Pipe. He has never used smokeless tobacco. He  reports that he does not drink alcohol or use illicit drugs.  Allergies  Allergen Reactions  . Codeine Sulfate     Itching on fingers   . Penicillins     Itching on fingers Has patient had a PCN reaction causing immediate rash, facial/tongue/throat swelling, SOB or lightheadedness with hypotension: Has patient had a PCN reaction causing severe rash involving mucus membranes or skin necrosis: Has patient had a PCN reaction that required hospitalization Has patient had a PCN reaction occurring within the last 10 years:  If all of the above answers are "NO", then may proceed with Cephalosporin use.     Family History  Problem Relation Age of Onset  . Heart disease Mother   . Heart disease Father   . Heart disease Son   . Hyperlipidemia Son      Prior to Admission medications   Medication Sig Start Date End Date Taking? Authorizing Provider  acetaminophen (TYLENOL) 500 MG tablet Take 500 mg by mouth every 6 (six) hours as needed (pain).   Yes Historical Provider, MD  alfuzosin (UROXATRAL) 10 MG 24 hr tablet Take 10 mg by mouth every evening.    Yes Historical Provider, MD  aminocaproic acid (AMICAR) 500 MG tablet Take 1 tablet (500 mg total) by mouth every 8 (eight) hours as needed for bleeding. 11/26/15  Yes Heath Lark, MD  calcium carbonate (TUMS - DOSED IN MG ELEMENTAL CALCIUM) 500 MG chewable tablet Chew 1 tablet by mouth daily as needed for indigestion or heartburn.   Yes Historical Provider, MD  docusate sodium (COLACE) 100 MG capsule Take 100 mg by mouth 2 (two) times daily.   Yes Historical Provider, MD  furosemide (LASIX) 20 MG tablet Take 1 tablet (20 mg total) by mouth daily. 12/22/15  Yes Evans Lance, MD  hydrocortisone (ANUSOL-HC) 25 MG suppository Place 1 suppository (25 mg total) rectally 2 (two) times daily. For 7 days 01/09/16  Yes Daleen Bo, MD  loperamide (IMODIUM) 2 MG capsule Take 2 mg by mouth daily as needed for diarrhea or loose stools.   Yes Historical  Provider, MD  Multiple Vitamin (MULTIVITAMIN WITH MINERALS) TABS Take 1 tablet by mouth every morning.   Yes Historical Provider, MD  pramoxine-hydrocortisone (ANALPRAM HC) cream Place 1 application rectally 4 (four) times daily.   Yes Historical Provider, MD  SYNTHROID 125 MCG tablet Take 125 mcg by mouth daily before breakfast.  09/15/14  Yes Historical Provider, MD  TOPROL XL 25 MG 24 hr tablet Take 25 mg by mouth daily. 12/24/13  Yes Historical Provider, MD    Physical Exam: Filed Vitals:   01/13/16 2230 01/13/16 2300 01/13/16 2330 01/14/16 0000  BP: 148/60 155/65 162/64 158/65  Pulse: 59 60 61 60  Temp:      TempSrc:      Resp: 16 20 23 19   SpO2: 97% 96% 95% 97%      Constitutional: NAD, calm, comfortable Eyes: PERTLA, lids and conjunctivae normal ENMT: Mucous membranes are dry. Posterior pharynx clear of any exudate or  lesions.   Neck: normal, supple, no masses, no thyromegaly Respiratory: clear to auscultation bilaterally, no wheezing, no crackles. Normal respiratory effort.   Cardiovascular: S1 & S2 heard, regular rate and rhythm, soft systolic murmur at LSB. No significant JVD. Abdomen: No distension, no tenderness, no masses palpated. Bowel sounds normal.  Musculoskeletal: no clubbing / cyanosis. No joint deformity upper and lower extremities. Normal muscle tone.  Skin: no significant rashes, lesions, ulcers. Warm, dry, well-perfused. Neurologic: CN 2-12 grossly intact. Sensation intact, DTR normal. Strength 5/5 in all 4 limbs.  Psychiatric: Normal judgment and insight. Alert and oriented x 3. Normal mood and affect.     Labs on Admission: I have personally reviewed following labs and imaging studies  CBC:  Recent Labs Lab 01/07/16 1009 01/09/16 1255 01/13/16 1012 01/13/16 2110  WBC 6.5 5.2 6.1 6.5  NEUTROABS 4.7 3.7 4.3 4.8  HGB 8.6* 7.4* 8.2* 8.9*  HCT 26.9* 24.0* 25.6* 27.1*  MCV 94.1 95.6 93.1 91.9  PLT 103* 70* 51* 43*   Basic Metabolic Panel:  Recent  Labs Lab 01/13/16 2314  NA 136  K 4.1  CL 107  CO2 24  GLUCOSE 124*  BUN 36*  CREATININE 2.11*  CALCIUM 8.5*   GFR: Estimated Creatinine Clearance: 22.3 mL/min (by C-G formula based on Cr of 2.11). Liver Function Tests:  Recent Labs Lab 01/13/16 2314  AST 55*  ALT 40  ALKPHOS 202*  BILITOT 1.3*  PROT 6.4*  ALBUMIN 3.4*   No results for input(s): LIPASE, AMYLASE in the last 168 hours. No results for input(s): AMMONIA in the last 168 hours. Coagulation Profile: No results for input(s): INR, PROTIME in the last 168 hours. Cardiac Enzymes: No results for input(s): CKTOTAL, CKMB, CKMBINDEX, TROPONINI in the last 168 hours. BNP (last 3 results) No results for input(s): PROBNP in the last 8760 hours. HbA1C: No results for input(s): HGBA1C in the last 72 hours. CBG: No results for input(s): GLUCAP in the last 168 hours. Lipid Profile: No results for input(s): CHOL, HDL, LDLCALC, TRIG, CHOLHDL, LDLDIRECT in the last 72 hours. Thyroid Function Tests: No results for input(s): TSH, T4TOTAL, FREET4, T3FREE, THYROIDAB in the last 72 hours. Anemia Panel:  Recent Labs  01/13/16 1012  RETICCTPCT 2.44*   Urine analysis:    Component Value Date/Time   COLORURINE YELLOW 10/31/2013 1750   APPEARANCEUR CLEAR 10/31/2013 1750   LABSPEC 1.018 10/31/2013 1750   PHURINE 5.0 10/31/2013 1750   GLUCOSEU NEGATIVE 10/31/2013 1750   HGBUR NEGATIVE 10/31/2013 1750   BILIRUBINUR NEGATIVE 10/31/2013 1750   KETONESUR NEGATIVE 10/31/2013 1750   PROTEINUR NEGATIVE 10/31/2013 1750   UROBILINOGEN 0.2 10/31/2013 1750   NITRITE NEGATIVE 10/31/2013 1750   LEUKOCYTESUR NEGATIVE 10/31/2013 1750   Sepsis Labs: @LABRCNTIP (procalcitonin:4,lacticidven:4) ) Recent Results (from the past 240 hour(s))  TECHNOLOGIST REVIEW     Status: None   Collection Time: 01/13/16 10:12 AM  Result Value Ref Range Status   Technologist Review few RBC fragments  Final     Radiological Exams on Admission: No  results found.  EKG: Not performed, will obtain as appropriate.   Assessment/Plan  1. Rectal bleeding  - Pt has chronic intermittent rectal bleeding for more than 1 yr, attributed to hemorrhoids  - Had a large-vol BRBPR prior to arrival, and another in ED  - Hgb on arrival is 8.9, higher than priors, after receiving 1 unit pRBCs at cancer center on 01/13/16  - Platelet count is 43k on arrival, and with active bleeding, will  transfuse 1 pheresis pack  - RN asked to order post-transfusion H&H  - Amicar 500 mg q8h for 5-7 days   2. Thrombocytopenia, normocytic anemia  - Followed by oncology in outpatient setting and attributed to likely MDS  - Received 1 unit pRBCs earlier in the day at the cancer center  - 1 pheresis pack of platelets ordered for immediate transfusion given count <50k and active bleed  - RN to place order for post-transfusion H&H    3. CKD stage III  - SCr 2.11 on admission, consistent with his apparent baseline  - Avoid nephrotoxins, renally-dose medications   4. Hypothyroidism  - Stable, continue current-dose Synthroid    5. Chronic systolic CHF  - TTE ([id number]) with EF 45-50%, mod TR, mild LVH, mild LAE, PA pk pr 61 mmHg   - Appears euvolemic or mildly dehydrated on admission  - Will receive transfusion, will otherwise SLIV  - Follow daily wts, I/O's  - Plan to resume home-dose Lasix in am    DVT prophylaxis: SCD's Code Status: Full  Family Communication: Family updated at bedside  Disposition Plan: Observe on telemetry  Consults called: None Admission status: Observation     Vianne Bulls, MD [facility] Pager [phone]  If 7PM-7AM, please contact night-coverage [email or web address] Password [email or web address]  01/14/2016, 12:31 AM

## 2016-01-14 NOTE — Consult Note (Signed)
Reason for Consult: Hematochezia Referring Physician: [facility]  Anthony Hutchinson HPI: This is a 82 year old male well known to me for complaints of hematochezia secondary to hemorrhoids, radiation proctitis, MDS, s/p AAA with stenting, CRI, and HTN admitted for complaints of painless hematochezia.  He had a couple of episodes of hematochezia at home and then in the ER he was witnessed to have more hematochezia.  He underwent a FFS in 2014 and as recently as 03/2015 for complaints of hematochezia.  Initially it was thought that his bleeding was from radiation proctitis and APC was applied in 2014.  With recurrent hematochezia, the 2016 FFS was only significant for some mild residual radiation proctitis.  The area was treated and further examination of his hemorrhoids revealed that this was the most probable site of bleeding.  Proximal to the rectum no blood was identified.  Past Medical History  Diagnosis Date  . Hypertension   . LBBB (left bundle branch block)   . Diabetes mellitus without complication (Anthony Hutchinson)   . Cancer (Anthony Hutchinson)   . Hypothyroidism     nodule  . Cataracts, bilateral   . Anemia     iron deficiency anemia  . Inguinal hernia   . Peripheral vascular disease (Anthony Hutchinson)     Peripheral neuropathy  . Complete heart block (Anthony Hutchinson)   . MDS (myelodysplastic syndrome) (Anthony Hutchinson) 01/05/2014    Past Surgical History  Procedure Laterality Date  . Tonsillectomy    . Hiatal hernia repair    . Hernia repair  1979    bilateral inguinal hernia  . Thyroid lobectomy  6/85    right  . Acromionectomy  1998    rotator cuff repair  . Prostate surgery    . Abdominal aortic aneurysm repair    . Tonsillectomy    . Flexible sigmoidoscopy N/A 12/13/2012    Procedure: FLEXIBLE SIGMOIDOSCOPY;  Surgeon: Anthony Beams, MD;  Location: [facility] ENDOSCOPY;  Service: Endoscopy;  Laterality: N/A;  . Hot hemostasis N/A 12/13/2012    Procedure: HOT HEMOSTASIS (ARGON PLASMA COAGULATION/BICAP);  Surgeon: Anthony Beams, MD;   Location: [facility];  Service: Endoscopy;  Laterality: N/A;  . Pacemaker insertion  11-03-13    STJ dual chamber pacemaker implanted by Dr Lovena Le for CHB  . Permanent pacemaker insertion N/A 11/03/2013    Procedure: PERMANENT PACEMAKER INSERTION;  Surgeon: Hutchinson Lance, MD;  Location: [facility] CATH LAB;  Service: Cardiovascular;  Laterality: N/A;  . Flexible sigmoidoscopy N/A 03/12/2015    Procedure: FLEXIBLE SIGMOIDOSCOPY;  Surgeon: Anthony Ada, MD;  Location: [facility] ENDOSCOPY;  Service: Endoscopy;  Laterality: N/A;  will need APC   . Hot hemostasis N/A 03/12/2015    Procedure: HOT HEMOSTASIS (ARGON PLASMA COAGULATION/BICAP);  Surgeon: Anthony Ada, MD;  Location: [facility];  Service: Endoscopy;  Laterality: N/A;    Family History  Problem Relation Age of Onset  . Heart disease Mother   . Heart disease Father   . Heart disease Son   . Hyperlipidemia Son     Social History:  reports that he quit smoking about 52 years ago. His smoking use included Pipe. He has never used smokeless tobacco. He reports that he does not drink alcohol or use illicit drugs.  Allergies:  Allergies  Allergen Reactions  . Codeine Sulfate     Itching on fingers   . Penicillins     Itching on fingers Has patient had a PCN reaction causing immediate rash, facial/tongue/throat swelling, SOB or lightheadedness with hypotension: Has  patient had a PCN reaction causing severe rash involving mucus membranes or skin necrosis: Has patient had a PCN reaction that required hospitalization Has patient had a PCN reaction occurring within the last 10 years:  If all of the above answers are "NO", then may proceed with Cephalosporin use.     Medications:  Scheduled: . alfuzosin  10 mg Oral QPM  . aminocaproic acid  500 mg Oral Q8H  . furosemide  20 mg Oral Daily  . hydrocortisone-pramoxine  1 application Rectal QID  . levothyroxine  125 mcg Oral QAC breakfast  . metoprolol succinate  25 mg Oral Daily  . multivitamin with  minerals  1 tablet Oral q morning - 10a  . sodium chloride flush  3 mL Intravenous Q12H   Continuous:   Results for orders placed or performed during the hospital encounter of 01/13/16 (from the past 24 hour(s))  POC occult blood, ED     Status: Abnormal   Collection Time: 01/13/16  9:02 PM  Result Value Ref Range   Fecal Occult Bld POSITIVE (A) NEGATIVE  CBC with Differential     Status: Abnormal   Collection Time: 01/13/16  9:10 PM  Result Value Ref Range   WBC 6.5 4.0 - 10.5 K/uL   RBC 2.95 (L) 4.22 - 5.81 MIL/uL   Hemoglobin 8.9 (L) 13.0 - 17.0 g/dL   HCT 27.1 (L) 39.0 - 52.0 %   MCV 91.9 78.0 - 100.0 fL   MCH 30.2 26.0 - 34.0 pg   MCHC 32.8 30.0 - 36.0 g/dL   RDW 21.6 (H) 11.5 - 15.5 %   Platelets 43 (L) 150 - 400 K/uL   Neutrophils Relative % 74 %   Lymphocytes Relative 14 %   Monocytes Relative 10 %   Eosinophils Relative 2 %   Basophils Relative 0 %   Neutro Abs 4.8 1.7 - 7.7 K/uL   Lymphs Abs 0.9 0.7 - 4.0 K/uL   Monocytes Absolute 0.7 0.1 - 1.0 K/uL   Eosinophils Absolute 0.1 0.0 - 0.7 K/uL   Basophils Absolute 0.0 0.0 - 0.1 K/uL   RBC Morphology SCHISTOCYTES NOTED ON SMEAR   Comprehensive metabolic panel     Status: Abnormal   Collection Time: 01/13/16 11:14 PM  Result Value Ref Range   Sodium 136 135 - 145 mmol/L   Potassium 4.1 3.5 - 5.1 mmol/L   Chloride 107 101 - 111 mmol/L   CO2 24 22 - 32 mmol/L   Glucose, Bld 124 (H) 65 - 99 mg/dL   BUN 36 (H) 6 - 20 mg/dL   Creatinine, Ser 2.11 (H) 0.61 - 1.24 mg/dL   Calcium 8.5 (L) 8.9 - 10.3 mg/dL   Total Protein 6.4 (L) 6.5 - 8.1 g/dL   Albumin 3.4 (L) 3.5 - 5.0 g/dL   AST 55 (H) 15 - 41 U/L   ALT 40 17 - 63 U/L   Alkaline Phosphatase 202 (H) 38 - 126 U/L   Total Bilirubin 1.3 (H) 0.3 - 1.2 mg/dL   GFR calc non Af Amer 26 (L) >60 mL/min   GFR calc Af Amer 30 (L) >60 mL/min   Anion gap 5 5 - 15  Prepare Pheresed Platelets     Status: None (Preliminary result)   Collection Time: 01/14/16  1:00 AM  Result  Value Ref Range   Unit Number [id number]    Blood Component Type PLTPHER LI2    Unit division 00    Status of Unit ISSUED  Transfusion Status OK TO TRANSFUSE   Type and screen [place]     Status: None   Collection Time: 01/14/16  2:23 AM  Result Value Ref Range   ABO/RH(Hutchinson) A POS    Antibody Screen NEG    Sample Expiration [id number]   Basic metabolic panel     Status: Abnormal   Collection Time: 01/14/16  6:53 AM  Result Value Ref Range   Sodium 135 135 - 145 mmol/L   Potassium 3.9 3.5 - 5.1 mmol/L   Chloride 106 101 - 111 mmol/L   CO2 25 22 - 32 mmol/L   Glucose, Bld 100 (H) 65 - 99 mg/dL   BUN 34 (H) 6 - 20 mg/dL   Creatinine, Ser 2.12 (H) 0.61 - 1.24 mg/dL   Calcium 8.3 (L) 8.9 - 10.3 mg/dL   GFR calc non Af Amer 25 (L) >60 mL/min   GFR calc Af Amer 29 (L) >60 mL/min   Anion gap 4 (L) 5 - 15  CBC     Status: Abnormal   Collection Time: 01/14/16  6:53 AM  Result Value Ref Range   WBC 5.2 4.0 - 10.5 K/uL   RBC 2.80 (L) 4.22 - 5.81 MIL/uL   Hemoglobin 8.5 (L) 13.0 - 17.0 g/dL   HCT 25.9 (L) 39.0 - 52.0 %   MCV 92.5 78.0 - 100.0 fL   MCH 30.4 26.0 - 34.0 pg   MCHC 32.8 30.0 - 36.0 g/dL   RDW 22.1 (H) 11.5 - 15.5 %   Platelets 52 (L) 150 - 400 K/uL     No results found.  ROS:  As stated above in the HPI otherwise negative.  Blood pressure 145/50, pulse 61, temperature 98.7 F (37.1 C), temperature source Oral, resp. rate 16, height 5' 9.02" (1.753 m), weight 77.111 kg (170 lb), SpO2 99 %.    PE: Gen: NAD, Alert and Oriented HEENT:  [place]/AT, EOMI Neck: Supple, no LAD Lungs: CTA Bilaterally CV: RRR without M/G/R ABM: Soft, NTND, +BS Ext: No C/C/E Rectal: Three large mildly prolapsed internal/external hemorrhoids, no blood on the examination glove.  Assessment/Plan: 1) Hematochezia. 2) Anemia. 3) MDS.   I suspect that he is bleeding from his hemorrhoids again.  His HGB is relatively stable.  In the past I expressed a concern for an  aorto-colonic fistula, but the 2016 FFS did not suggest this etiology.  Reexamination of his anus showed an external hemorrhoidal thrombosis.  Examination today does reveal three prolapsed internal/external hemorrhoids.  No evidence of any blood with the digital examination.  An ice pack will help to decrease the size of the hemorrhoids to decrease the risk of bleeding.    Plan: 1) Ice pack to anus. 2) Follow HGB and transfuse as necessary.  Anthony Hutchinson 01/14/2016, 11:09 AM

## 2016-01-14 NOTE — Progress Notes (Signed)
PROGRESS NOTE        PATIENT DETAILS Name: Anthony Hutchinson Age: 82 y.o. Sex: male Date of Birth: 1922/02/28 Admit Date: 01/13/2016 Admitting Physician Vianne Bulls, MD [id number], MD  Brief Narrative: Patient is a 82 y.o. 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 Admitted for further supportive care and evaluation.  Subjective: No further hematochezia since this morning.  Assessment/Plan: Principal Problem: Painless hematochezia: Patient has a long-standing history of chronic intermittent hematochezia, however he was admitted for 2 episodes of large volume hematochezia that he had yesterday. has a history of radiation proctitis causing hematochezia in the past, suspect this is probably related to hemorrhoids. GI consulted, will continue ice pack to the anus and supportive care. We will monitor overnight, if he is stable without any major hematochezia-he likely will be discharged home on 7/8  Active Problems: Anemia: Chronic issue-likely secondary to myelodysplastic syndrome. Transfuse as needed.  Thrombocytopenia: Chronic issue secondary to myelodysplastic syndrome-transfused 1 unit of apheresed platelets last night. Continue to monitor. Thankfully GI bleeding seems to have resolved.  History of myelodysplastic syndrome: Followed by Dr. Alvy Bimler in the outpatient setting-continue to monitor CBC closely, continue Amicar  Chronic kidney disease stage III: Creatinine close to her usual baseline, follow.  Hypothyroidism: Continue Synthroid.  Chronic systolic heart failure: Clinically compensated. Resume Lasix, continue metoprolol. Follow.  History of complete heart block-permanent pacemaker in place  DVT Prophylaxis: SCD's  Code Status: Full code   Family Communication: None at bedside-patient  awake and alert, understanding of the above noted plan.  Disposition Plan: Remain inpatient-suspect should be able to discharge on 7/8.  Antimicrobial agents: None  Procedures: None  CONSULTS:  GI  Time spent: 25 minutes-Greater than 50% of this time was spent in counseling, explanation of diagnosis, planning of further management, and coordination of care.  MEDICATIONS: Anti-infectives    None      Scheduled Meds: . alfuzosin  10 mg Oral QPM  . aminocaproic acid  500 mg Oral Q8H  . furosemide  20 mg Oral Daily  . hydrocortisone-pramoxine  1 application Rectal QID  . levothyroxine  125 mcg Oral QAC breakfast  . metoprolol succinate  25 mg Oral Daily  . multivitamin with minerals  1 tablet Oral q morning - 10a  . sodium chloride flush  3 mL Intravenous Q12H   Continuous Infusions:  PRN Meds:.acetaminophen, calcium carbonate, ondansetron **OR** ondansetron (ZOFRAN) IV   PHYSICAL EXAM: Vital signs: Filed Vitals:   01/14/16 0432 01/14/16 0538 01/14/16 0958 01/14/16 1408  BP: 136/54 136/49 145/50 149/57  Pulse: 60 59 61 66  Temp: 98.2 F (36.8 C) 97.9 F (36.6 C) 98.7 F (37.1 C) 98.7 F (37.1 C)  TempSrc: Oral Oral Oral Oral  Resp: 18 18 16 14   Height:      Weight:      SpO2: 99% 98% 99% 98%   Filed Weights   01/14/16 0110  Weight: 77.111 kg (170 lb)   Body mass index is 25.09 kg/(m^2).   Gen Exam: Awake and alert with clear speech. Not in any distress  Neck: Supple, No JVD.   Chest: B/L Clear.   CVS: S1 S2 Regular  Abdomen: soft, BS +, non tender, non distended.  Extremities: no edema, lower extremities warm  to touch. Neurologic: Non Focal.   Skin: No Rash or lesions   Wounds: N/A.   LABORATORY DATA: CBC:  Recent Labs Lab 01/09/16 1255 01/13/16 1012 01/13/16 2110 01/14/16 0653  WBC 5.2 6.1 6.5 5.2  NEUTROABS 3.7 4.3 4.8  --   HGB 7.4* 8.2* 8.9* 8.5*  HCT 24.0* 25.6* 27.1* 25.9*  MCV 95.6 93.1 91.9 92.5  PLT 70* 51* 43* 52*    Basic  Metabolic Panel:  Recent Labs Lab 01/13/16 2314 01/14/16 0653  NA 136 135  K 4.1 3.9  CL 107 106  CO2 24 25  GLUCOSE 124* 100*  BUN 36* 34*  CREATININE 2.11* 2.12*  CALCIUM 8.5* 8.3*    GFR: Estimated Creatinine Clearance: 22.2 mL/min (by C-G formula based on Cr of 2.12).  Liver Function Tests:  Recent Labs Lab 01/13/16 2314  AST 55*  ALT 40  ALKPHOS 202*  BILITOT 1.3*  PROT 6.4*  ALBUMIN 3.4*   No results for input(s): LIPASE, AMYLASE in the last 168 hours. No results for input(s): AMMONIA in the last 168 hours.  Coagulation Profile: No results for input(s): INR, PROTIME in the last 168 hours.  Cardiac Enzymes: No results for input(s): CKTOTAL, CKMB, CKMBINDEX, TROPONINI in the last 168 hours.  BNP (last 3 results) No results for input(s): PROBNP in the last 8760 hours.  HbA1C: No results for input(s): HGBA1C in the last 72 hours.  CBG: No results for input(s): GLUCAP in the last 168 hours.  Lipid Profile: No results for input(s): CHOL, HDL, LDLCALC, TRIG, CHOLHDL, LDLDIRECT in the last 72 hours.  Thyroid Function Tests: No results for input(s): TSH, T4TOTAL, FREET4, T3FREE, THYROIDAB in the last 72 hours.  Anemia Panel:  Recent Labs  01/13/16 1012  RETICCTPCT 2.44*    Urine analysis:    Component Value Date/Time   COLORURINE YELLOW 10/31/2013 1750   APPEARANCEUR CLEAR 10/31/2013 1750   LABSPEC 1.018 10/31/2013 1750   PHURINE 5.0 10/31/2013 1750   GLUCOSEU NEGATIVE 10/31/2013 1750   HGBUR NEGATIVE 10/31/2013 1750   BILIRUBINUR NEGATIVE 10/31/2013 1750   KETONESUR NEGATIVE 10/31/2013 1750   PROTEINUR NEGATIVE 10/31/2013 1750   UROBILINOGEN 0.2 10/31/2013 1750   NITRITE NEGATIVE 10/31/2013 1750   LEUKOCYTESUR NEGATIVE 10/31/2013 1750    Sepsis Labs: Lactic Acid, Venous No results found for: LATICACIDVEN  MICROBIOLOGY: Recent Results (from the past 240 hour(s))  TECHNOLOGIST REVIEW     Status: None   Collection Time: 01/13/16 10:12  AM  Result Value Ref Range Status   Technologist Review few RBC fragments  Final    RADIOLOGY STUDIES/RESULTS: No results found.     Oren Binet, MD  [facility] Pager:[phone]  If 7PM-7AM, please contact night-coverage [email or web address] Password [email or web address] 01/14/2016, 2:31 PM

## 2016-01-15 DIAGNOSIS — K625 Hemorrhage of anus and rectum: Secondary | ICD-10-CM | POA: Diagnosis not present

## 2016-01-15 LAB — CBC
HEMATOCRIT: 26.7 % — AB (ref 39.0–52.0)
HEMOGLOBIN: 8.7 g/dL — AB (ref 13.0–17.0)
MCH: 30.3 pg (ref 26.0–34.0)
MCHC: 32.6 g/dL (ref 30.0–36.0)
MCV: 93 fL (ref 78.0–100.0)
Platelets: 44 10*3/uL — ABNORMAL LOW (ref 150–400)
RBC: 2.87 MIL/uL — ABNORMAL LOW (ref 4.22–5.81)
RDW: 22 % — AB (ref 11.5–15.5)
WBC: 3.6 10*3/uL — ABNORMAL LOW (ref 4.0–10.5)

## 2016-01-15 LAB — BASIC METABOLIC PANEL
Anion gap: 3 — ABNORMAL LOW (ref 5–15)
BUN: 35 mg/dL — ABNORMAL HIGH (ref 6–20)
CALCIUM: 8.2 mg/dL — AB (ref 8.9–10.3)
CHLORIDE: 107 mmol/L (ref 101–111)
CO2: 25 mmol/L (ref 22–32)
CREATININE: 2.04 mg/dL — AB (ref 0.61–1.24)
GFR calc Af Amer: 31 mL/min — ABNORMAL LOW (ref >60)
GFR calc non Af Amer: 27 mL/min — ABNORMAL LOW (ref >60)
GLUCOSE: 108 mg/dL — AB (ref 65–99)
Potassium: 4.2 mmol/L (ref 3.5–5.1)
Sodium: 135 mmol/L (ref 135–145)

## 2016-01-15 LAB — GLUCOSE, CAPILLARY: Glucose-Capillary: 116 mg/dL — ABNORMAL HIGH (ref 65–99)

## 2016-01-15 MED ORDER — DOCUSATE SODIUM 100 MG PO CAPS: 100.0000 mg | ORAL_CAPSULE | Freq: Two times a day (BID) | ORAL | Status: DC

## 2016-01-15 MED ORDER — HYDROCORTISONE ACETATE 25 MG RE SUPP: 25.0000 mg | Freq: Two times a day (BID) | RECTAL | Status: DC

## 2016-01-15 NOTE — Care Management Obs Status (Signed)
[facility] NOTIFICATION   Patient Details  Name: RUVEN MAHEU MRN: [id number] Date of Birth: [birth year]   Medicare Observation Status Notification Given:  Yes    Erenest Rasher, RN 01/15/2016, 11:00 AM

## 2016-01-15 NOTE — Progress Notes (Signed)
Discharged from floor via w/c for transport home by car, belongings & family with pt. No changes in assessment. Anthony Hutchinson, [employer]

## 2016-01-15 NOTE — Discharge Instructions (Signed)
Follow with Primary MD RAMACHANDRAN,AJITH, MD in 3-5 days , apply ice pack to the rectal area 3 times a day as needed for hemorrhoidal bleeding.  Get CBC, CMP, 2 view Chest X ray checked  by Primary MD or SNF MD in 5-7 days ( we routinely change or add medications that can affect your baseline labs and fluid status, therefore we recommend that you get the mentioned basic workup next visit with your PCP, your PCP may decide not to get them or add new tests based on their clinical decision)   Activity: As tolerated with Full fall precautions use walker/cane & assistance as needed   Disposition Home     Diet:   Heart Healthy ** , with feeding assistance and aspiration precautions.  For Heart failure patients - Check your Weight same time everyday, if you gain over 2 pounds, or you develop in leg swelling, experience more shortness of breath or chest pain, call your Primary MD immediately. Follow Cardiac Low Salt Diet and 1.5 lit/day fluid restriction.   On your next visit with your primary care physician please Get Medicines reviewed and adjusted.   Please request your Prim.MD to go over all Hospital Tests and Procedure/Radiological results at the follow up, please get all Hospital records sent to your Prim MD by signing hospital release before you go home.   If you experience worsening of your admission symptoms, develop shortness of breath, life threatening emergency, suicidal or homicidal thoughts you must seek medical attention immediately by calling 911 or calling your MD immediately  if symptoms less severe.  You Must read complete instructions/literature along with all the possible adverse reactions/side effects for all the Medicines you take and that have been prescribed to you. Take any new Medicines after you have completely understood and accpet all the possible adverse reactions/side effects.   Do not drive, operate heavy machinery, perform activities at heights, swimming or  participation in water activities or provide baby sitting services if your were admitted for syncope or siezures until you have seen by Primary MD or a Neurologist and advised to do so again.  Do not drive when taking Pain medications.    Do not take more than prescribed Pain, Sleep and Anxiety Medications  Special Instructions: If you have smoked or chewed Tobacco  in the last 2 yrs please stop smoking, stop any regular Alcohol  and or any Recreational drug use.  Wear Seat belts while driving.   Please note  You were cared for by a hospitalist during your hospital stay. If you have any questions about your discharge medications or the care you received while you were in the hospital after you are discharged, you can call the unit and asked to speak with the hospitalist on call if the hospitalist that took care of you is not available. Once you are discharged, your primary care physician will handle any further medical issues. Please note that NO REFILLS for any discharge medications will be authorized once you are discharged, as it is imperative that you return to your primary care physician (or establish a relationship with a primary care physician if you do not have one) for your aftercare needs so that they can reassess your need for medications and monitor your lab values.

## 2016-01-15 NOTE — Discharge Summary (Signed)
Anthony Hutchinson [id number] DOB: [birth year] DOA: 01/13/2016  PCP: Merrilee Seashore, MD  Admit date: 01/13/2016  Discharge date: 01/15/2016  Admitted From: Home  Disposition:  Home   Recommendations for Outpatient Follow-up:   Follow up with PCP in 1-2 weeks  PCP Please obtain BMP/CBC, 2 view CXR in 1week,  (see Discharge instructions)   PCP Please follow up on the following pending results: None   Home Health: PT   Equipment/Devices: None  Discharge Condition: Stable   CODE STATUS: Full  Diet Recommendation: Heart Healthy Consultations: None   Chief Complaint  Patient presents with  . Rectal Bleeding     Brief history of present illness from the day of admission and additional interim summary    Patient is a 82 y.o. 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 Admitted for further supportive care and evaluation.   Hospital issues addressed     Painless hematochezia: Patient has a long-standing history of chronic intermittent hematochezia, however he was admitted for 2 episodes of large volume hematochezia that he had yesterday. has a history of radiation proctitis causing hematochezia in the past, suspect this is probably related to hemorrhoids. GI consulted, And exam showed 3 prolapsed hemorrhoids, with one external hemorrhoid thrombosed, he received ice pack to his anorectal area with good relief and no further bleed. Hemoglobin stayed stable. Cleared for discharge by GI, continue Anusol cream and ice packs to the rectum as needed.    Anemia: Chronic issue-likely secondary to myelodysplastic syndrome. Transfuse as needed.  Thrombocytopenia: Chronic issue  secondary to myelodysplastic syndrome-transfused 1 unit of apheresed platelets on the day of admission, platelet count now stable without any further bleeding as above, PCP to Continue to monitor. Thankfully GI bleeding seems to have resolved.  History of myelodysplastic syndrome: Followed by Dr. Alvy Bimler in the outpatient setting-continue to monitor CBC closely, continue Amicar  Chronic kidney disease stage III: Creatinine close to her usual baseline, no acute issue.  Hypothyroidism: Continue Synthroid.  Chronic systolic heart failure: Clinically compensated. Resume Lasix, continue metoprolol. Follow.  History of complete heart block- permanent pacemaker in place   Discharge diagnosis     Principal Problem:   Rectal bleeding Active Problems:   Chronic systolic heart failure (HCC)   Complete heart block (HCC)   MDS (myelodysplastic syndrome) (HCC)   Thrombocytopenia (HCC)   AAA (abdominal aortic aneurysm) without rupture (HCC)   Stage III chronic kidney disease   Hypothyroidism    Discharge instructions        Discharge Instructions    Diet - low sodium heart healthy    Complete by:  As directed      Discharge instructions    Complete by:  As directed   Follow with Primary MD RAMACHANDRAN,AJITH, MD in 3-5 days , apply ice pack to the rectal area 3 times a day as needed for hemorrhoidal bleeding.  Get CBC, CMP, 2 view Chest X ray checked  by Primary MD or SNF MD in 5-7 days ( we routinely change or add medications that can affect your baseline labs and fluid status, therefore we recommend that you get the mentioned basic workup next visit with your PCP, your PCP may decide not to get them or add new tests based on their clinical decision)   Activity: As tolerated with Full fall precautions use walker/cane & assistance as needed   Disposition Home     Diet:   Heart Healthy ** , with feeding assistance and aspiration precautions.  For Heart failure patients - Check your  Weight same time everyday, if you gain over 2 pounds, or you develop in leg swelling, experience more shortness of breath or chest pain, call your Primary MD immediately. Follow Cardiac Low Salt Diet and 1.5 lit/day fluid restriction.   On your next visit with your primary care physician please Get Medicines reviewed and adjusted.   Please request your Prim.MD to go over all Hospital Tests and Procedure/Radiological results at the follow up, please get all Hospital records sent to your Prim MD by signing hospital release before you go home.   If you experience worsening of your admission symptoms, develop shortness of breath, life threatening emergency, suicidal or homicidal thoughts you must seek medical attention immediately by calling 911 or calling your MD immediately  if symptoms less severe.  You Must read complete instructions/literature along with all the possible adverse reactions/side effects for all the Medicines you take and that have been prescribed to you. Take any new Medicines after you have completely understood and accpet all the possible adverse reactions/side effects.   Do not drive, operate heavy machinery, perform activities at heights, swimming or participation in water activities or provide baby sitting services if your were admitted for syncope or siezures until you have seen by Primary MD or a Neurologist and advised to do so again.  Do not drive when taking Pain medications.    Do not take more than prescribed Pain, Sleep and Anxiety Medications  Special Instructions: If you have smoked or chewed Tobacco  in the last 2 yrs please stop smoking, stop any regular Alcohol  and or any Recreational drug use.  Wear Seat belts while driving.   Please note  You were cared for by a hospitalist during your hospital stay. If you have any questions about your discharge medications or the care you received while you were in the hospital after you are discharged, you can call  the unit and asked to speak with the hospitalist on call if the hospitalist that took care of you is not available. Once you are discharged, your primary care physician will handle any further medical issues. Please note that NO REFILLS for any discharge medications will be authorized once you are discharged, as it is imperative that you return to your primary care physician (or establish a relationship with a primary care physician if you do not have one) for your aftercare needs so that they can reassess your need for medications and monitor your lab values.     Increase activity slowly    Complete by:  As directed            Discharge Medications     Medication List    TAKE these medications        acetaminophen 500 MG tablet  Commonly known as:  TYLENOL  Take 500 mg by mouth every 6 (six) hours as needed (pain).     alfuzosin  10 MG 24 hr tablet  Commonly known as:  UROXATRAL  Take 10 mg by mouth every evening.     aminocaproic acid 500 MG tablet  Commonly known as:  AMICAR  Take 1 tablet (500 mg total) by mouth every 8 (eight) hours as needed for bleeding.     calcium carbonate 500 MG chewable tablet  Commonly known as:  TUMS - dosed in mg elemental calcium  Chew 1 tablet by mouth daily as needed for indigestion or heartburn.     docusate sodium 100 MG capsule  Commonly known as:  COLACE  Take 1 capsule (100 mg total) by mouth 2 (two) times daily.     furosemide 20 MG tablet  Commonly known as:  LASIX  Take 1 tablet (20 mg total) by mouth daily.     hydrocortisone 25 MG suppository  Commonly known as:  ANUSOL-HC  Place 1 suppository (25 mg total) rectally 2 (two) times daily. For 7 days     loperamide 2 MG capsule  Commonly known as:  IMODIUM  Take 2 mg by mouth daily as needed for diarrhea or loose stools.     multivitamin with minerals Tabs tablet  Take 1 tablet by mouth every morning.     pramoxine-hydrocortisone cream  Commonly known as:  ANALPRAM HC  Place  1 application rectally 4 (four) times daily.     SYNTHROID 125 MCG tablet  Generic drug:  levothyroxine  Take 125 mcg by mouth daily before breakfast.     TOPROL XL 25 MG 24 hr tablet  Generic drug:  metoprolol succinate  Take 25 mg by mouth daily.        Allergies  Allergen Reactions  . Codeine Sulfate     Itching on fingers   . Penicillins     Itching on fingers Has patient had a PCN reaction causing immediate rash, facial/tongue/throat swelling, SOB or lightheadedness with hypotension: Has patient had a PCN reaction causing severe rash involving mucus membranes or skin necrosis: Has patient had a PCN reaction that required hospitalization Has patient had a PCN reaction occurring within the last 10 years:  If all of the above answers are "NO", then may proceed with Cephalosporin use.     Follow-up Information    Follow up with [facility], MD. Schedule an appointment as soon as possible for a visit in 3 days.   Specialty:  Internal Medicine   Contact information:   [place] [address] [phone]       Follow up with [facility], [facility], MD. Schedule an appointment as soon as possible for a visit in 1 week.   Specialty:  Hematology and Oncology   Contact information:   [place] [address] [phone]       Major procedures and Radiology Reports - PLEASE review detailed and final reports thoroughly  -          No results found.  Micro Results      Recent Results (from the past 240 hour(s))  TECHNOLOGIST REVIEW     Status: None   Collection Time: 01/13/16 10:12 AM  Result Value Ref Range Status   Technologist Review few RBC fragments  Final    Today   Subjective    Anthony Hutchinson today has no headache,no chest abdominal pain,no new weakness tingling or numbness, feels much better wants to go home today.     Objective   Blood pressure 140/52, pulse 60, temperature 97.7  F (36.5 C), temperature  source Oral, resp. rate 14, height 5' 9.02" (1.753 m), weight 76.543 kg (168 lb 12 oz), SpO2 100 %.   Intake/Output Summary (Last 24 hours) at 01/15/16 1044 Last data filed at 01/15/16 0859  Gross per 24 hour  Intake    890 ml  Output   2377 ml  Net  -1487 ml    Exam  Awake Alert, Oriented x 3, No new F.N deficits, Normal affect [place].AT,PERRAL Supple Neck,No JVD, No cervical lymphadenopathy appriciated.  Symmetrical Chest wall movement, Good air movement bilaterally, CTAB RRR,No Gallops,Rubs or new Murmurs, No Parasternal Heave +ve B.Sounds, Abd Soft, Non tender, No organomegaly appriciated, No rebound -guarding or rigidity. No Cyanosis, Clubbing or edema, No new Rash or bruise    Data Review   CBC w Diff:  Lab Results  Component Value Date   WBC 3.6* 01/15/2016   WBC 6.1 01/13/2016   HGB 8.7* 01/15/2016   HGB 8.2* 01/13/2016   HCT 26.7* 01/15/2016   HCT 25.6* 01/13/2016   PLT 44* 01/15/2016   PLT 51* 01/13/2016   LYMPHOPCT 14 01/13/2016   LYMPHOPCT 17.1 01/13/2016   MONOPCT 10 01/13/2016   MONOPCT 9.5 01/13/2016   EOSPCT 2 01/13/2016   EOSPCT 2.5 01/13/2016   BASOPCT 0 01/13/2016   BASOPCT 0.3 01/13/2016    CMP:  Lab Results  Component Value Date   NA 135 01/15/2016   NA 138 01/05/2014   K 4.2 01/15/2016   K 4.6 01/05/2014   CL 107 01/15/2016   CO2 25 01/15/2016   CO2 25 01/05/2014   BUN 35* 01/15/2016   BUN 25.0 01/05/2014   CREATININE 2.04* 01/15/2016   CREATININE 2.22* 12/21/2015   CREATININE 1.7* 01/05/2014   PROT 6.4* 01/13/2016   PROT 7.1 01/05/2014   ALBUMIN 3.4* 01/13/2016   ALBUMIN 3.7 01/05/2014   BILITOT 1.3* 01/13/2016   BILITOT 0.91 01/05/2014   ALKPHOS 202* 01/13/2016   ALKPHOS 254* 01/05/2014   AST 55* 01/13/2016   AST 64* 01/05/2014   ALT 40 01/13/2016   ALT 70* 01/05/2014  .   Total Time in preparing paper work, data evaluation and todays exam - 35 minutes  Thurnell Lose M.D on 01/15/2016 at 10:44 AM  [facility]     Office  [phone]

## 2016-01-15 NOTE — Progress Notes (Signed)
Physical Therapy Treatment Patient Details Name: Anthony Hutchinson MRN: [id number] DOB: [birth year] Today's Date: 01/15/2016    History of Present Illness Pt admitted with rectal bleeding and hx of htn, MDS, CKD, PVD, AAA repair, Pacemaker and peripheral neuropathy Bil feet and finger tips    PT Comments    Pt admitted as above and mobilizing at Supervision level.  Pt states he ambulates similarly at home but "I can go faster with my 4 wheeled walker".  Pt eager for dc home this date with family.  Follow Up Recommendations  No PT follow up     Equipment Recommendations  None recommended by PT    Recommendations for Other Services       Precautions / Restrictions Precautions Precautions: Fall Precaution Comments: No sensation bilat feet Restrictions Weight Bearing Restrictions: No    Mobility  Bed Mobility Overal bed mobility: Modified Independent             General bed mobility comments: Pt unassisted into bed  Transfers Overall transfer level: Needs assistance Equipment used: Rolling walker (2 wheeled) Transfers: Sit to/from Stand Sit to Stand: Min guard;Supervision         General transfer comment: cues for use of UEs to assist for saftey  Ambulation/Gait Ambulation/Gait assistance: Min guard;Supervision Ambulation Distance (Feet): 222 Feet Assistive device: Rolling walker (2 wheeled) Gait Pattern/deviations: Step-through pattern;Shuffle;Trunk flexed     General Gait Details: cues for position from RW   Stairs Stairs: Yes Stairs assistance: Min guard Stair Management: Two rails;Step to pattern;Forwards Number of Stairs: 3    Wheelchair Mobility    Modified Rankin (Stroke Patients Only)       Balance Overall balance assessment: Needs assistance Sitting-balance support: No upper extremity supported;Feet supported Sitting balance-Leahy Scale: Good     Standing balance support: Bilateral upper extremity supported Standing balance-Leahy Scale:  Fair                      Cognition Arousal/Alertness: Awake/alert Behavior During Therapy: WFL for tasks assessed/performed Overall Cognitive Status: Within Functional Limits for tasks assessed                      Exercises      General Comments        Pertinent Vitals/Pain Pain Assessment: No/denies pain    Home Living Family/patient expects to be discharged to:: Private residence Living Arrangements: Children Available Help at Discharge: Family Type of Home: House Home Access: Stairs to enter Entrance Stairs-Rails: Right Home Layout: One level Home Equipment: Cane - single point;Walker - 4 wheels      Prior Function Level of Independence: Independent with assistive device(s)          PT Goals (current goals can now be found in the care plan section) Acute Rehab PT Goals Patient Stated Goal: HOME PT Goal Formulation: All assessment and education complete, DC therapy    Frequency  Min 1X/week    PT Plan      Co-evaluation             End of Session   Activity Tolerance: Patient tolerated treatment well Patient left: in bed;with call bell/phone within reach;with bed alarm set     Time: 1010-1032 PT Time Calculation (min) (ACUTE ONLY): 22 min  Charges:                       G Codes:  Functional Assessment Tool Used: Clinical  judgement Functional Limitation: Mobility: Walking and moving around Mobility: Walking and Moving Around Current Status [phone]): At least 1 percent but less than 20 percent impaired, limited or restricted Mobility: Walking and Moving Around Goal Status [phone]): At least 1 percent but less than 20 percent impaired, limited or restricted Mobility: Walking and Moving Around Discharge Status [phone]): At least 1 percent but less than 20 percent impaired, limited or restricted   Jacorie Ernsberger 01/15/2016, 12:47 PM

## 2016-01-17 ENCOUNTER — Telehealth: Payer: Self-pay | Admitting: *Deleted

## 2016-01-17 ENCOUNTER — Other Ambulatory Visit: Payer: Self-pay | Admitting: Hematology and Oncology

## 2016-01-17 ENCOUNTER — Other Ambulatory Visit: Payer: Self-pay | Admitting: *Deleted

## 2016-01-17 LAB — PREPARE PLATELET PHERESIS: Unit division: 0

## 2016-01-17 NOTE — Telephone Encounter (Signed)
TC from patient's son. He states his father was in the hospital last week and is now home. He was told upon discharge that he would need to see Dr. Alvy Bimler for follow up. He does have appointments for lab and transfusion and injection for 01/27/16 but not with Dr. Alvy Bimler.

## 2016-01-17 NOTE — Telephone Encounter (Signed)
Left VM message for son re: appt with Dr. Alvy Bimler on 01/27/16.   POF sent for MD appt and lab time change.

## 2016-01-17 NOTE — Telephone Encounter (Signed)
I can see him at 930 am on 7/20 He may need to come in earlier that day for labs Can you please call him, make sure it is OK and then send off a POF

## 2016-01-19 ENCOUNTER — Telehealth: Payer: Self-pay | Admitting: Hematology and Oncology

## 2016-01-19 NOTE — Telephone Encounter (Signed)
Added f/u to 7/20 appointments. Spoke with patient son re add on and new appointment time for 7/20 at 9 am.

## 2016-01-21 ENCOUNTER — Telehealth: Payer: Self-pay | Admitting: Internal Medicine

## 2016-01-21 NOTE — Telephone Encounter (Addendum)
Per Dr Tanna Furry note: 1. Chronic diastolic heart failure - his symptoms are class 2-3 and more right sided. I have asked him to increase his lasix to 40 mg daily. He will return for labs in a week.  I have left a message with the [facility] nurse that when the patient was in the office last he was having SOB and edema all CHF symptoms.  I have asked her to instruct patient on daily weights as well as other symptoms to look for when retaining fluid.  His weight is down and should follow up with PCP for crackles in lungs if they do not get better

## 2016-01-21 NOTE — Telephone Encounter (Signed)
Home health nurse, Lyons, states that this is her first visit with Anthony Hutchinson and she has orders from [place] to teach the patient about CHF and rectal bleeding. The patient and his son, Anthony Hutchinson, were unaware that the patient was diagnosed with CHF and they were calling our office to confirm this.  Additionally, Caitlyn wanted to let us know that a nurse saw [name] on Monday, 7/10 and reported his weight was up to 166lbs and he had "crackling in his lungs". The nurse instructed him to take 40mg  of Lasix for 3 days. As of today the patient's weight is at 164.6 and there is still some crackling in his lungs.  Please call Caitlyn (home health nurse) @ [phone] or the patient's son, Anthony Hutchinson, @ [phone] to discuss these two items.

## 2016-01-27 ENCOUNTER — Telehealth: Payer: Self-pay | Admitting: Hematology and Oncology

## 2016-01-27 ENCOUNTER — Other Ambulatory Visit (HOSPITAL_BASED_OUTPATIENT_CLINIC_OR_DEPARTMENT_OTHER): Payer: Medicare Other

## 2016-01-27 ENCOUNTER — Encounter: Payer: Self-pay | Admitting: Hematology and Oncology

## 2016-01-27 ENCOUNTER — Ambulatory Visit (HOSPITAL_BASED_OUTPATIENT_CLINIC_OR_DEPARTMENT_OTHER): Payer: Medicare Other | Admitting: Hematology and Oncology

## 2016-01-27 ENCOUNTER — Ambulatory Visit (HOSPITAL_BASED_OUTPATIENT_CLINIC_OR_DEPARTMENT_OTHER): Payer: Medicare Other

## 2016-01-27 VITALS — BP 146/50 | HR 69 | Temp 97.7°F | Resp 18 | Ht 65.0 in | Wt 161.6 lb

## 2016-01-27 DIAGNOSIS — K625 Hemorrhage of anus and rectum: Secondary | ICD-10-CM | POA: Diagnosis not present

## 2016-01-27 DIAGNOSIS — D696 Thrombocytopenia, unspecified: Secondary | ICD-10-CM

## 2016-01-27 DIAGNOSIS — L089 Local infection of the skin and subcutaneous tissue, unspecified: Secondary | ICD-10-CM

## 2016-01-27 DIAGNOSIS — D630 Anemia in neoplastic disease: Secondary | ICD-10-CM

## 2016-01-27 DIAGNOSIS — L03119 Cellulitis of unspecified part of limb: Secondary | ICD-10-CM | POA: Insufficient documentation

## 2016-01-27 DIAGNOSIS — D469 Myelodysplastic syndrome, unspecified: Secondary | ICD-10-CM

## 2016-01-27 DIAGNOSIS — D649 Anemia, unspecified: Secondary | ICD-10-CM

## 2016-01-27 DIAGNOSIS — N183 Chronic kidney disease, stage 3 (moderate): Secondary | ICD-10-CM

## 2016-01-27 DIAGNOSIS — D631 Anemia in chronic kidney disease: Secondary | ICD-10-CM

## 2016-01-27 DIAGNOSIS — L03116 Cellulitis of left lower limb: Secondary | ICD-10-CM

## 2016-01-27 LAB — CBC & DIFF AND RETIC
BASO%: 0.5 % (ref 0.0–2.0)
Basophils Absolute: 0 10*3/uL (ref 0.0–0.1)
EOS%: 2.3 % (ref 0.0–7.0)
Eosinophils Absolute: 0.1 10*3/uL (ref 0.0–0.5)
HCT: 26.7 % — ABNORMAL LOW (ref 38.4–49.9)
HGB: 8.6 g/dL — ABNORMAL LOW (ref 13.0–17.1)
Immature Retic Fract: 7 % (ref 3.00–10.60)
LYMPH%: 16.2 % (ref 14.0–49.0)
MCH: 29.9 pg (ref 27.2–33.4)
MCHC: 32.2 g/dL (ref 32.0–36.0)
MCV: 92.7 fL (ref 79.3–98.0)
MONO#: 0.5 10*3/uL (ref 0.1–0.9)
MONO%: 8.3 % (ref 0.0–14.0)
NEUT%: 72.7 % (ref 39.0–75.0)
NEUTROS ABS: 4.1 10*3/uL (ref 1.5–6.5)
Platelets: 58 10*3/uL — ABNORMAL LOW (ref 140–400)
RBC: 2.88 10*6/uL — AB (ref 4.20–5.82)
RDW: 22.6 % — AB (ref 11.0–14.6)
RETIC %: 2.03 % — AB (ref 0.80–1.80)
Retic Ct Abs: 58.46 10*3/uL (ref 34.80–93.90)
WBC: 5.6 10*3/uL (ref 4.0–10.3)
lymph#: 0.9 10*3/uL (ref 0.9–3.3)

## 2016-01-27 LAB — TECHNOLOGIST REVIEW

## 2016-01-27 MED ORDER — CEPHALEXIN 500 MG PO CAPS: 500.0000 mg | ORAL_CAPSULE | Freq: Three times a day (TID) | ORAL | Status: DC

## 2016-01-27 MED ORDER — AMINOCAPROIC ACID 500 MG PO TABS: 500.0000 mg | ORAL_TABLET | Freq: Three times a day (TID) | ORAL | Status: DC | PRN

## 2016-01-27 MED ORDER — DARBEPOETIN ALFA 500 MCG/ML IJ SOSY
500.0000 ug | PREFILLED_SYRINGE | Freq: Once | INTRAMUSCULAR | Status: AC
  Administered 2016-01-27: 500 ug via SUBCUTANEOUS
  Filled 2016-01-27: qty 1

## 2016-01-27 MED FILL — AMICAR 500 MG TABLET: 500 | 10 days supply | Qty: 30 | Fill #0

## 2016-01-27 MED FILL — CEPHALEXIN 500 MG CAPSULE: 500 | 7 days supply | Qty: 21 | Fill #0

## 2016-01-27 NOTE — Assessment & Plan Note (Signed)
He has chronic intermittent rectal bleeding. 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 His rectal bleeding has stopped recently. I recommend Amicar daily for minimum 3 days until it stops, preferably for 7-10 days . With this low platelet count & radiation proctitis, I think he would be at risk for further bleeding in the future. We have a long discussion about the risk and benefit of referring him back to gastroenterologist for possible banding of external hemorrhoids or injection of sclerotic agents. The patient wants to avoid further surgical procedure for now

## 2016-01-27 NOTE — Progress Notes (Signed)
[facility] OFFICE PROGRESS NOTE  Patient Care Team: Merrilee Seashore, MD as PCP - General (Internal Medicine) Heath Lark, MD as Consulting Physician (Hematology and Oncology)  SUMMARY OF ONCOLOGIC HISTORY: Oncology History   MDS, low IPSS score     MDS (myelodysplastic syndrome) ([place])   12/09/2004 Bone Marrow Biopsy Bone marrow biopsy showed HYPERCELLULAR MARROW WITH DYSPOIETIC FEATURES AND RING SIDEROBLASTS,  FAVOR MYELODYSPLASTIC SYNDROME   05/26/2011 - 01/01/2014 Chemotherapy The patient was given Procrit every 3 weeks 40,000 units for anemia.   01/22/2014 -  Chemotherapy He is placed on Aranesp 300 mcg   03/26/2014 -  Chemotherapy Aranesp is increased to 500 mcg    INTERVAL HISTORY: Please see below for problem oriented charting. He is seen for further follow-up. He was recently admitted to the hospital due to profuse rectal bleeding. He had another episode of rectal bleeding last night that resolved. He continues to have problems with external hemorrhoidal bleeding but is reluctant for further local therapy. He denies epistaxis or hematuria. While hospitalized, he had accidentally Injured his leg and now has a pressure sore. He denies significant pain No recent fever or chills  REVIEW OF SYSTEMS:   Constitutional: Denies fevers, chills or abnormal weight loss Eyes: Denies blurriness of vision Ears, nose, mouth, throat, and face: Denies mucositis or sore throat Respiratory: Denies cough, dyspnea or wheezes Cardiovascular: Denies palpitation, chest discomfort or lower extremity swelling Lymphatics: Denies new lymphadenopathy or easy bruising Neurological:Denies numbness, tingling or new weaknesses Behavioral/Psych: Mood is stable, no new changes  All other systems were reviewed with the patient and are negative.  I have reviewed the past medical history, past surgical history, social history and family history with the patient and they are unchanged from  previous note.  ALLERGIES:  is allergic to codeine sulfate and penicillins.  MEDICATIONS:  Current Outpatient Prescriptions  Medication Sig Dispense Refill  . acetaminophen (TYLENOL) 500 MG tablet Take 500 mg by mouth every 6 (six) hours as needed (pain).    Marland Kitchen alfuzosin (UROXATRAL) 10 MG 24 hr tablet Take 10 mg by mouth every evening.     Marland Kitchen aminocaproic acid (AMICAR) 500 MG tablet Take 1 tablet (500 mg total) by mouth every 8 (eight) hours as needed for bleeding. 30 tablet 9  . calcium carbonate (TUMS - DOSED IN MG ELEMENTAL CALCIUM) 500 MG chewable tablet Chew 1 tablet by mouth daily as needed for indigestion or heartburn.    . docusate sodium (COLACE) 100 MG capsule Take 1 capsule (100 mg total) by mouth 2 (two) times daily. (Patient taking differently: Take 100 mg by mouth daily. ) 30 capsule 0  . furosemide (LASIX) 20 MG tablet Take 1 tablet (20 mg total) by mouth daily. 90 tablet 3  . loperamide (IMODIUM) 2 MG capsule Take 2 mg by mouth daily as needed for diarrhea or loose stools.    . Multiple Vitamin (MULTIVITAMIN WITH MINERALS) TABS Take 1 tablet by mouth every morning.    . pramoxine-hydrocortisone (ANALPRAM HC) cream Place 1 application rectally 4 (four) times daily.    Marland Kitchen SYNTHROID 125 MCG tablet Take 125 mcg by mouth daily before breakfast.     . TOPROL XL 25 MG 24 hr tablet Take 25 mg by mouth daily.    . cephALEXin (KEFLEX) 500 MG capsule Take 1 capsule (500 mg total) by mouth 3 (three) times daily. 21 capsule 0   No current facility-administered medications for this visit.    PHYSICAL EXAMINATION: ECOG PERFORMANCE  STATUS: 2 - Symptomatic, <50% confined to bed  Filed Vitals:   01/27/16 0947  BP: 146/50  Pulse: 69  Temp: 97.7 F (36.5 C)  Resp: 18   Filed Weights   01/27/16 0947  Weight: 161 lb 9.6 oz (73.301 kg)    GENERAL:alert, no distress and comfortable SKIN:He has extensive bruises. No petechiae. Noted infected sore behind his left leg EYES: normal,  Conjunctiva are pink and non-injected, sclera clear Musculoskeletal:no cyanosis of digits and no clubbing  NEURO: alert & oriented x 3 with fluent speech, no focal motor/sensory deficits  LABORATORY DATA:  I have reviewed the data as listed    Component Value Date/Time   NA 135 01/15/2016 0528   NA 138 01/05/2014 1159   K 4.2 01/15/2016 0528   K 4.6 01/05/2014 1159   CL 107 01/15/2016 0528   CO2 25 01/15/2016 0528   CO2 25 01/05/2014 1159   GLUCOSE 108* 01/15/2016 0528   GLUCOSE 94 01/05/2014 1159   BUN 35* 01/15/2016 0528   BUN 25.0 01/05/2014 1159   CREATININE 2.04* 01/15/2016 0528   CREATININE 2.22* 12/21/2015 1110   CREATININE 1.7* 01/05/2014 1159   CALCIUM 8.2* 01/15/2016 0528   CALCIUM 9.3 01/05/2014 1159   PROT 6.4* 01/13/2016 2314   PROT 7.1 01/05/2014 1159   ALBUMIN 3.4* 01/13/2016 2314   ALBUMIN 3.7 01/05/2014 1159   AST 55* 01/13/2016 2314   AST 64* 01/05/2014 1159   ALT 40 01/13/2016 2314   ALT 70* 01/05/2014 1159   ALKPHOS 202* 01/13/2016 2314   ALKPHOS 254* 01/05/2014 1159   BILITOT 1.3* 01/13/2016 2314   BILITOT 0.91 01/05/2014 1159   GFRNONAA 27* 01/15/2016 0528   GFRAA 31* 01/15/2016 0528    No results found for: SPEP, UPEP  Lab Results  Component Value Date   WBC 5.6 01/27/2016   NEUTROABS 4.1 01/27/2016   HGB 8.6* 01/27/2016   HCT 26.7* 01/27/2016   MCV 92.7 01/27/2016   PLT 58* 01/27/2016      Chemistry      Component Value Date/Time   NA 135 01/15/2016 0528   NA 138 01/05/2014 1159   K 4.2 01/15/2016 0528   K 4.6 01/05/2014 1159   CL 107 01/15/2016 0528   CO2 25 01/15/2016 0528   CO2 25 01/05/2014 1159   BUN 35* 01/15/2016 0528   BUN 25.0 01/05/2014 1159   CREATININE 2.04* 01/15/2016 0528   CREATININE 2.22* 12/21/2015 1110   CREATININE 1.7* 01/05/2014 1159      Component Value Date/Time   CALCIUM 8.2* 01/15/2016 0528   CALCIUM 9.3 01/05/2014 1159   ALKPHOS 202* 01/13/2016 2314   ALKPHOS 254* 01/05/2014 1159   AST 55*  01/13/2016 2314   AST 64* 01/05/2014 1159   ALT 40 01/13/2016 2314   ALT 70* 01/05/2014 1159   BILITOT 1.3* 01/13/2016 2314   BILITOT 0.91 01/05/2014 1159        ASSESSMENT & PLAN:  MDS (myelodysplastic syndrome) (HCC) Given his multiple comorbidities, he may have an element of anemia chronic disease, likely myelodysplastic syndrome with intermittent bleeding He has responded very well to Aranesp until recently which I suspect the drop of hemoglobin and platelet was due to consumption I plan to continue his injection at 500 g every 2 weeks to keep hemoglobin above 11 g. The patient can also received blood transfusion as needed for symptomatic anemia. We discussed the risks and benefit of blood transfusion and ultimately we decided that he does not need  blood transfusion today.  We discussed limitation of other treatment at his age and he is comfortable to continue conservative management with ESA and transfusion as needed  Thrombocytopenia [facility]) The most likely cause of this is due to consumption from bleeding and CT scan April 2016 showed liver cirrhosis and splenomegaly Low-grade myelodysplastic syndrome can also cause thrombocytopenia. The present time, he can be observed as long as platelet count is greater than 50,000. He had recent worsening thrombocytopenia likely due to consumption. We also discussed the use of low-dose prednisone some time for chronic thrombocytopenia however that could potentially increase his risk of side effects. For now, we will observe and he does not need platelet transfusion today  Bacterial skin infection of leg He had recent pressure sore from recent hospitalization. The wound appears open and I'm worried about signs of redness around the edge of the wound that could represent early infection. I noted he had penicillin allergy. When I questioned his allergic reaction, it was mainly itching and hives. I discussed with him and his son the risks,  benefits, side effects of Keflex and he agreed to proceed. He has home health nurse coming to his health for wound dressing. I took some pictures today and will follow next week  Rectal bleeding He has chronic intermittent rectal bleeding. 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 His rectal bleeding has stopped recently. I recommend Amicar daily for minimum 3 days until it stops, preferably for 7-10 days . With this low platelet count & radiation proctitis, I think he would be at risk for further bleeding in the future. We have a long discussion about the risk and benefit of referring him back to gastroenterologist for possible banding of external hemorrhoids or injection of sclerotic agents. The patient wants to avoid further surgical procedure for now   No orders of the defined types were placed in this encounter.   All questions were answered. The patient knows to call the clinic with any problems, questions or concerns. No barriers to learning was detected. I spent 30 minutes counseling the patient face to face. The total time spent in the appointment was 40 minutes and more than 50% was on counseling and review of test results     [facility], [place], MD 01/27/2016 2:35 PM

## 2016-01-27 NOTE — Telephone Encounter (Signed)
Pt confirmed the changes to the scheduled and refused the avs.

## 2016-01-27 NOTE — Assessment & Plan Note (Signed)
The most likely cause of this is due to consumption from bleeding and CT scan April 2016 showed liver cirrhosis and splenomegaly Low-grade myelodysplastic syndrome can also cause thrombocytopenia. The present time, he can be observed as long as platelet count is greater than 50,000. He had recent worsening thrombocytopenia likely due to consumption. We also discussed the use of low-dose prednisone some time for chronic thrombocytopenia however that could potentially increase his risk of side effects. For now, we will observe and he does not need platelet transfusion today

## 2016-01-27 NOTE — Assessment & Plan Note (Signed)
He had recent pressure sore from recent hospitalization. The wound appears open and I'm worried about signs of redness around the edge of the wound that could represent early infection. I noted he had penicillin allergy. When I questioned his allergic reaction, it was mainly itching and hives. I discussed with him and his son the risks, benefits, side effects of Keflex and he agreed to proceed. He has home health nurse coming to his health for wound dressing. I took some pictures today and will follow next week

## 2016-01-27 NOTE — Assessment & Plan Note (Signed)
Given his multiple comorbidities, he may have an element of anemia chronic disease, likely myelodysplastic syndrome with intermittent bleeding He has responded very well to Aranesp until recently which I suspect the drop of hemoglobin and platelet was due to consumption I plan to continue his injection at 500 g every 2 weeks to keep hemoglobin above 11 g. The patient can also received blood transfusion as needed for symptomatic anemia. We discussed the risks and benefit of blood transfusion and ultimately we decided that he does not need blood transfusion today.  We discussed limitation of other treatment at his age and he is comfortable to continue conservative management with ESA and transfusion as needed

## 2016-01-27 NOTE — Patient Instructions (Signed)
Darbepoetin Alfa injection What is this medicine? DARBEPOETIN ALFA (dar be POE e tin AL fa) helps your body make more red blood cells. It is used to treat anemia caused by chronic kidney failure and chemotherapy. This medicine may be used for other purposes; ask your health care provider or pharmacist if you have questions. COMMON BRAND NAME(S): Aranesp What should I tell my health care provider before I take this medicine? They need to know if you have any of these conditions: -blood clotting disorders or history of blood clots -cancer patient not on chemotherapy -cystic fibrosis -heart disease, such as angina, heart failure, or a history of a heart attack -hemoglobin level of 12 g/dL or greater -high blood pressure -low levels of folate, iron, or vitamin B12 -seizures -an unusual or allergic reaction to darbepoetin, erythropoietin, albumin, hamster proteins, latex, other medicines, foods, dyes, or preservatives -pregnant or trying to get pregnant -breast-feeding How should I use this medicine? This medicine is for injection into a vein or under the skin. It is usually given by a health care professional in a hospital or clinic setting. If you get this medicine at home, you will be taught how to prepare and give this medicine. Do not shake the solution before you withdraw a dose. Use exactly as directed. Take your medicine at regular intervals. Do not take your medicine more often than directed. It is important that you put your used needles and syringes in a special sharps container. Do not put them in a trash can. If you do not have a sharps container, call your pharmacist or healthcare provider to get one. Talk to your pediatrician regarding the use of this medicine in children. While this medicine may be used in children as young as 1 year for selected conditions, precautions do apply. Overdosage: If you think you have taken too much of this medicine contact a poison control center or  emergency room at once. NOTE: This medicine is only for you. Do not share this medicine with others. What if I miss a dose? If you miss a dose, take it as soon as you can. If it is almost time for your next dose, take only that dose. Do not take double or extra doses. What may interact with this medicine? Do not take this medicine with any of the following medications: -epoetin alfa This list may not describe all possible interactions. Give your health care provider a list of all the medicines, herbs, non-prescription drugs, or dietary supplements you use. Also tell them if you smoke, drink alcohol, or use illegal drugs. Some items may interact with your medicine. What should I watch for while using this medicine? Visit your prescriber or health care professional for regular checks on your progress and for the needed blood tests and blood pressure measurements. It is especially important for the doctor to make sure your hemoglobin level is in the desired range, to limit the risk of potential side effects and to give you the best benefit. Keep all appointments for any recommended tests. Check your blood pressure as directed. Ask your doctor what your blood pressure should be and when you should contact him or her. As your body makes more red blood cells, you may need to take iron, folic acid, or vitamin B supplements. Ask your doctor or health care provider which products are right for you. If you have kidney disease continue dietary restrictions, even though this medication can make you feel better. Talk with your doctor or health   care professional about the foods you eat and the vitamins that you take. What side effects may I notice from receiving this medicine? Side effects that you should report to your doctor or health care professional as soon as possible: -allergic reactions like skin rash, itching or hives, swelling of the face, lips, or tongue -breathing problems -changes in vision -chest  pain -confusion, trouble speaking or understanding -feeling faint or lightheaded, falls -high blood pressure -muscle aches or pains -pain, swelling, warmth in the leg -rapid weight gain -severe headaches -sudden numbness or weakness of the face, arm or leg -trouble walking, dizziness, loss of balance or coordination -seizures (convulsions) -swelling of the ankles, feet, hands -unusually weak or tired Side effects that usually do not require medical attention (report to your doctor or health care professional if they continue or are bothersome): -diarrhea -fever, chills (flu-like symptoms) -headaches -nausea, vomiting -redness, stinging, or swelling at site where injected This list may not describe all possible side effects. Call your doctor for medical advice about side effects. You may report side effects to FDA at 1-800-FDA-1088. Where should I keep my medicine? Keep out of the reach of children. Store in a refrigerator between 2 and 8 degrees C (36 and 46 degrees F). Do not freeze. Do not shake. Throw away any unused portion if using a single-dose vial. Throw away any unused medicine after the expiration date. NOTE: This sheet is a summary. It may not cover all possible information. If you have questions about this medicine, talk to your doctor, pharmacist, or health care provider.  2015, [employer]vier/[employer] Standard. (2008-06-09 10:23:57)  

## 2016-01-28 ENCOUNTER — Telehealth: Payer: Self-pay | Admitting: *Deleted

## 2016-01-28 NOTE — Telephone Encounter (Signed)
Hospice nurse called for orders for dressing change. They consulted wound nurse for direction. Verbal order given.

## 2016-02-02 ENCOUNTER — Other Ambulatory Visit: Payer: Self-pay | Admitting: Internal Medicine

## 2016-02-02 ENCOUNTER — Ambulatory Visit (HOSPITAL_BASED_OUTPATIENT_CLINIC_OR_DEPARTMENT_OTHER): Payer: Medicare Other | Admitting: Hematology and Oncology

## 2016-02-02 ENCOUNTER — Other Ambulatory Visit (HOSPITAL_BASED_OUTPATIENT_CLINIC_OR_DEPARTMENT_OTHER): Payer: Medicare Other

## 2016-02-02 ENCOUNTER — Encounter: Payer: Self-pay | Admitting: Hematology and Oncology

## 2016-02-02 DIAGNOSIS — L089 Local infection of the skin and subcutaneous tissue, unspecified: Secondary | ICD-10-CM

## 2016-02-02 DIAGNOSIS — D696 Thrombocytopenia, unspecified: Secondary | ICD-10-CM | POA: Diagnosis not present

## 2016-02-02 DIAGNOSIS — D469 Myelodysplastic syndrome, unspecified: Secondary | ICD-10-CM

## 2016-02-02 DIAGNOSIS — K625 Hemorrhage of anus and rectum: Secondary | ICD-10-CM | POA: Diagnosis not present

## 2016-02-02 DIAGNOSIS — L03116 Cellulitis of left lower limb: Secondary | ICD-10-CM

## 2016-02-02 LAB — CBC & DIFF AND RETIC
BASO%: 0.5 % (ref 0.0–2.0)
Basophils Absolute: 0 10*3/uL (ref 0.0–0.1)
EOS ABS: 0.2 10*3/uL (ref 0.0–0.5)
EOS%: 4.3 % (ref 0.0–7.0)
HEMATOCRIT: 24.2 % — AB (ref 38.4–49.9)
HGB: 7.7 g/dL — ABNORMAL LOW (ref 13.0–17.1)
IMMATURE RETIC FRACT: 10.1 % (ref 3.00–10.60)
LYMPH%: 16.3 % (ref 14.0–49.0)
MCH: 29.6 pg (ref 27.2–33.4)
MCHC: 31.8 g/dL — AB (ref 32.0–36.0)
MCV: 93.1 fL (ref 79.3–98.0)
MONO#: 0.4 10*3/uL (ref 0.1–0.9)
MONO%: 9.9 % (ref 0.0–14.0)
NEUT#: 2.9 10*3/uL (ref 1.5–6.5)
NEUT%: 69 % (ref 39.0–75.0)
Platelets: 86 10*3/uL — ABNORMAL LOW (ref 140–400)
RBC: 2.6 10*6/uL — ABNORMAL LOW (ref 4.20–5.82)
RDW: 23.1 % — ABNORMAL HIGH (ref 11.0–14.6)
RETIC CT ABS: 9.62 10*3/uL — AB (ref 34.80–93.90)
Retic %: <0.38 % — ABNORMAL LOW (ref 0.5–1.6)
WBC: 4.2 10*3/uL (ref 4.0–10.3)
lymph#: 0.7 10*3/uL — ABNORMAL LOW (ref 0.9–3.3)
nRBC: 0 % (ref 0–0)

## 2016-02-02 LAB — TECHNOLOGIST REVIEW

## 2016-02-02 NOTE — Assessment & Plan Note (Signed)
He has chronic intermittent rectal bleeding. 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 His rectal bleeding has stopped recently. I recommend Amicar daily for minimum 3 days until it stops, preferably for 7-10 days With this low platelet count & radiation proctitis, I think he would be at risk for further bleeding in the future. We have a long discussion about the risk and benefit of referring him back to gastroenterologist for possible banding of external hemorrhoids or injection of sclerotic agents. The patient wants to avoid further surgical procedure for now His bleeding has stopped with regular Amicar use. Continue to same.

## 2016-02-02 NOTE — Assessment & Plan Note (Signed)
Given his multiple comorbidities, he may have an element of anemia chronic disease, likely myelodysplastic syndrome with intermittent bleeding He has responded very well to Aranesp until recently which I suspect the drop of hemoglobin and platelet was due to consumption I plan to continue his injection at 500 g every 2 weeks to keep hemoglobin above 11 g. The patient can also received blood transfusion as needed for symptomatic anemia. We discussed the risks and benefit of blood transfusion and ultimately we decided that he does not need blood transfusion today.  We discussed limitation of other treatment at his age and he is comfortable to continue conservative management with ESA and transfusion as needed

## 2016-02-02 NOTE — Progress Notes (Signed)
[place] OFFICE PROGRESS NOTE  Patient Care Team: Merrilee Seashore, MD as PCP - General (Internal Medicine) Heath Lark, MD as Consulting Physician (Hematology and Oncology)  SUMMARY OF ONCOLOGIC HISTORY: Oncology History   MDS, low IPSS score     MDS (myelodysplastic syndrome) ([place])   12/09/2004 Bone Marrow Biopsy    Bone marrow biopsy showed HYPERCELLULAR MARROW WITH DYSPOIETIC FEATURES AND RING SIDEROBLASTS,  FAVOR MYELODYSPLASTIC SYNDROME     05/26/2011 - 01/01/2014 Chemotherapy    The patient was given Procrit every 3 weeks 40,000 units for anemia.     01/22/2014 -  Chemotherapy    He is placed on Aranesp 300 mcg     03/26/2014 -  Chemotherapy    Aranesp is increased to 500 mcg      INTERVAL HISTORY: Please see below for problem oriented charting. He returns today with his son. He tolerated antibiotic therapy well without side effects. His rectal bleeding has stopped since last week. Denies nosebleeds or hematuria. Overall, he feels well. Denies dizziness, chest pain or shortness of breath  REVIEW OF SYSTEMS:   Constitutional: Denies fevers, chills or abnormal weight loss Eyes: Denies blurriness of vision Ears, nose, mouth, throat, and face: Denies mucositis or sore throat Respiratory: Denies cough, dyspnea or wheezes Cardiovascular: Denies palpitation, chest discomfort or lower extremity swelling Gastrointestinal:  Denies nausea, heartburn or change in bowel habits Lymphatics: Denies new lymphadenopathy  Neurological:Denies numbness, tingling or new weaknesses Behavioral/Psych: Mood is stable, no new changes  All other systems were reviewed with the patient and are negative.  I have reviewed the past medical history, past surgical history, social history and family history with the patient and they are unchanged from previous note.  ALLERGIES:  is allergic to codeine sulfate and penicillins.  MEDICATIONS:  Current Outpatient Prescriptions   Medication Sig Dispense Refill  . acetaminophen (TYLENOL) 500 MG tablet Take 500 mg by mouth every 6 (six) hours as needed (pain).    Marland Kitchen alfuzosin (UROXATRAL) 10 MG 24 hr tablet Take 10 mg by mouth every evening.     Marland Kitchen aminocaproic acid (AMICAR) 500 MG tablet Take 1 tablet (500 mg total) by mouth every 8 (eight) hours as needed for bleeding. 30 tablet 9  . calcium carbonate (TUMS - DOSED IN MG ELEMENTAL CALCIUM) 500 MG chewable tablet Chew 1 tablet by mouth daily as needed for indigestion or heartburn.    . cephALEXin (KEFLEX) 500 MG capsule Take 1 capsule (500 mg total) by mouth 3 (three) times daily. 21 capsule 0  . docusate sodium (COLACE) 100 MG capsule Take 1 capsule (100 mg total) by mouth 2 (two) times daily. (Patient taking differently: Take 100 mg by mouth daily. ) 30 capsule 0  . furosemide (LASIX) 20 MG tablet Take 1 tablet (20 mg total) by mouth daily. 90 tablet 3  . loperamide (IMODIUM) 2 MG capsule Take 2 mg by mouth daily as needed for diarrhea or loose stools.    . Multiple Vitamin (MULTIVITAMIN WITH MINERALS) TABS Take 1 tablet by mouth every morning.    . pramoxine-hydrocortisone (ANALPRAM HC) cream Place 1 application rectally 4 (four) times daily.    Marland Kitchen SYNTHROID 125 MCG tablet Take 125 mcg by mouth daily before breakfast.     . TOPROL XL 25 MG 24 hr tablet Take 25 mg by mouth daily.     No current facility-administered medications for this visit.     PHYSICAL EXAMINATION: ECOG PERFORMANCE STATUS: 1 - Symptomatic but completely  ambulatory  Vitals:   02/02/16 0853  BP: (!) 120/45  Pulse: 62  Resp: 16  Temp: 97.7 F (36.5 C)   Filed Weights   02/02/16 0853  Weight: 167 lb 14.4 oz (76.2 kg)    GENERAL:alert, no distress and comfortable SKIN: Noted skin bruising. Previous skin ulcer is healing and signs of cellulitis had resolved EYES: normal, Conjunctiva are pale and non-injected, sclera clear  Musculoskeletal:no cyanosis of digits and no clubbing  NEURO: alert  & oriented x 3 with fluent speech, no focal motor/sensory deficits  LABORATORY DATA:  I have reviewed the data as listed    Component Value Date/Time   NA 135 01/15/2016 0528   NA 138 01/05/2014 1159   K 4.2 01/15/2016 0528   K 4.6 01/05/2014 1159   CL 107 01/15/2016 0528   CO2 25 01/15/2016 0528   CO2 25 01/05/2014 1159   GLUCOSE 108 (H) 01/15/2016 0528   GLUCOSE 94 01/05/2014 1159   BUN 35 (H) 01/15/2016 0528   BUN 25.0 01/05/2014 1159   CREATININE 2.04 (H) 01/15/2016 0528   CREATININE 2.22 (H) 12/21/2015 1110   CREATININE 1.7 (H) 01/05/2014 1159   CALCIUM 8.2 (L) 01/15/2016 0528   CALCIUM 9.3 01/05/2014 1159   PROT 6.4 (L) 01/13/2016 2314   PROT 7.1 01/05/2014 1159   ALBUMIN 3.4 (L) 01/13/2016 2314   ALBUMIN 3.7 01/05/2014 1159   AST 55 (H) 01/13/2016 2314   AST 64 (H) 01/05/2014 1159   ALT 40 01/13/2016 2314   ALT 70 (H) 01/05/2014 1159   ALKPHOS 202 (H) 01/13/2016 2314   ALKPHOS 254 (H) 01/05/2014 1159   BILITOT 1.3 (H) 01/13/2016 2314   BILITOT 0.91 01/05/2014 1159   GFRNONAA 27 (L) 01/15/2016 0528   GFRAA 31 (L) 01/15/2016 0528    No results found for: SPEP, UPEP  Lab Results  Component Value Date   WBC 4.2 02/02/2016   NEUTROABS 2.9 02/02/2016   HGB 7.7 (L) 02/02/2016   HCT 24.2 (L) 02/02/2016   MCV 93.1 02/02/2016   PLT 86 (L) 02/02/2016      Chemistry      Component Value Date/Time   NA 135 01/15/2016 0528   NA 138 01/05/2014 1159   K 4.2 01/15/2016 0528   K 4.6 01/05/2014 1159   CL 107 01/15/2016 0528   CO2 25 01/15/2016 0528   CO2 25 01/05/2014 1159   BUN 35 (H) 01/15/2016 0528   BUN 25.0 01/05/2014 1159   CREATININE 2.04 (H) 01/15/2016 0528   CREATININE 2.22 (H) 12/21/2015 1110   CREATININE 1.7 (H) 01/05/2014 1159      Component Value Date/Time   CALCIUM 8.2 (L) 01/15/2016 0528   CALCIUM 9.3 01/05/2014 1159   ALKPHOS 202 (H) 01/13/2016 2314   ALKPHOS 254 (H) 01/05/2014 1159   AST 55 (H) 01/13/2016 2314   AST 64 (H) 01/05/2014 1159    ALT 40 01/13/2016 2314   ALT 70 (H) 01/05/2014 1159   BILITOT 1.3 (H) 01/13/2016 2314   BILITOT 0.91 01/05/2014 1159      ASSESSMENT & PLAN:  MDS (myelodysplastic syndrome) (HCC) Given his multiple comorbidities, he may have an element of anemia chronic disease, likely myelodysplastic syndrome with intermittent bleeding He has responded very well to Aranesp until recently which I suspect the drop of hemoglobin and platelet was due to consumption I plan to continue his injection at 500 g every 2 weeks to keep hemoglobin above 11 g. The patient can also received blood  transfusion as needed for symptomatic anemia. We discussed the risks and benefit of blood transfusion and ultimately we decided that he does not need blood transfusion today.  We discussed limitation of other treatment at his age and he is comfortable to continue conservative management with ESA and transfusion as needed  Thrombocytopenia [facility]) The most likely cause of this is due to consumption from bleeding and CT scan April 2016 showed liver cirrhosis and splenomegaly Low-grade myelodysplastic syndrome can also cause thrombocytopenia. The present time, he can be observed as long as platelet count is greater than 50,000. He had recent worsening thrombocytopenia likely due to consumption. Today, his platelet count has improved, mainly because his bleeding has stopped We also discussed the use of low-dose prednisone some time for chronic thrombocytopenia however that could potentially increase his risk of side effects. For now, we will observe and he does not need platelet transfusion today  Rectal bleeding He has chronic intermittent rectal bleeding. 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 His rectal bleeding has stopped recently. I recommend Amicar daily for minimum 3 days until it stops, preferably for 7-10 days With this low platelet count & radiation proctitis,  I think he would be at risk for further bleeding in the future. We have a long discussion about the risk and benefit of referring him back to gastroenterologist for possible banding of external hemorrhoids or injection of sclerotic agents. The patient wants to avoid further surgical procedure for now His bleeding has stopped with regular Amicar use. Continue to same.  Bacterial skin infection of leg He had recent pressure sore from recent hospitalization. He was started on Keflex prescription last week and tolerated that without side effects. His wound is healing He has home health nurse coming to his health for wound dressing.   No orders of the defined types were placed in this encounter.  All questions were answered. The patient knows to call the clinic with any problems, questions or concerns. No barriers to learning was detected. I spent 15 minutes counseling the patient face to face. The total time spent in the appointment was 20 minutes and more than 50% was on counseling and review of test results     [facility], Navpreet Szczygiel, MD 02/02/2016 11:12 AM

## 2016-02-02 NOTE — Assessment & Plan Note (Signed)
The most likely cause of this is due to consumption from bleeding and CT scan April 2016 showed liver cirrhosis and splenomegaly Low-grade myelodysplastic syndrome can also cause thrombocytopenia. The present time, he can be observed as long as platelet count is greater than 50,000. He had recent worsening thrombocytopenia likely due to consumption. Today, his platelet count has improved, mainly because his bleeding has stopped We also discussed the use of low-dose prednisone some time for chronic thrombocytopenia however that could potentially increase his risk of side effects. For now, we will observe and he does not need platelet transfusion today

## 2016-02-02 NOTE — Assessment & Plan Note (Signed)
He had recent pressure sore from recent hospitalization. He was started on Keflex prescription last week and tolerated that without side effects. His wound is healing He has home health nurse coming to his health for wound dressing.

## 2016-02-08 ENCOUNTER — Ambulatory Visit (HOSPITAL_COMMUNITY)
Admission: RE | Admit: 2016-02-08 | Discharge: 2016-02-08 | Disposition: A | Discharge status: Home / Self Care | Payer: Medicare Other | Source: Ambulatory Visit | Attending: Hematology and Oncology | Admitting: Hematology and Oncology

## 2016-02-08 DIAGNOSIS — D469 Myelodysplastic syndrome, unspecified: Secondary | ICD-10-CM | POA: Insufficient documentation

## 2016-02-09 ENCOUNTER — Ambulatory Visit: Payer: Medicare Other | Admitting: Podiatry

## 2016-02-10 ENCOUNTER — Ambulatory Visit: Payer: Medicare Other

## 2016-02-10 ENCOUNTER — Ambulatory Visit (HOSPITAL_BASED_OUTPATIENT_CLINIC_OR_DEPARTMENT_OTHER): Payer: Medicare Other

## 2016-02-10 ENCOUNTER — Other Ambulatory Visit (HOSPITAL_BASED_OUTPATIENT_CLINIC_OR_DEPARTMENT_OTHER): Payer: Medicare Other

## 2016-02-10 ENCOUNTER — Other Ambulatory Visit: Payer: Self-pay | Admitting: Hematology and Oncology

## 2016-02-10 VITALS — BP 146/66 | HR 64 | Temp 98.0°F | Resp 18

## 2016-02-10 DIAGNOSIS — N183 Chronic kidney disease, stage 3 (moderate): Secondary | ICD-10-CM

## 2016-02-10 DIAGNOSIS — D631 Anemia in chronic kidney disease: Secondary | ICD-10-CM | POA: Diagnosis not present

## 2016-02-10 DIAGNOSIS — D469 Myelodysplastic syndrome, unspecified: Secondary | ICD-10-CM

## 2016-02-10 DIAGNOSIS — D630 Anemia in neoplastic disease: Secondary | ICD-10-CM

## 2016-02-10 LAB — CBC & DIFF AND RETIC
BASO%: 0.5 % (ref 0.0–2.0)
Basophils Absolute: 0 10*3/uL (ref 0.0–0.1)
EOS%: 4.6 % (ref 0.0–7.0)
Eosinophils Absolute: 0.2 10*3/uL (ref 0.0–0.5)
HCT: 23.7 % — ABNORMAL LOW (ref 38.4–49.9)
HGB: 7.5 g/dL — ABNORMAL LOW (ref 13.0–17.1)
Immature Retic Fract: 10.4 % (ref 3.00–10.60)
LYMPH%: 15.9 % (ref 14.0–49.0)
MCH: 30 pg (ref 27.2–33.4)
MCHC: 31.6 g/dL — ABNORMAL LOW (ref 32.0–36.0)
MCV: 94.8 fL (ref 79.3–98.0)
MONO#: 0.4 10*3/uL (ref 0.1–0.9)
MONO%: 8.9 % (ref 0.0–14.0)
NEUT#: 2.9 10*3/uL (ref 1.5–6.5)
NEUT%: 70.1 % (ref 39.0–75.0)
Platelets: 54 10*3/uL — ABNORMAL LOW (ref 140–400)
RBC: 2.5 10*6/uL — ABNORMAL LOW (ref 4.20–5.82)
RDW: 24.1 % — ABNORMAL HIGH (ref 11.0–14.6)
Retic %: 2.42 % — ABNORMAL HIGH (ref 0.80–1.80)
Retic Ct Abs: 60.5 10*3/uL (ref 34.80–93.90)
WBC: 4.2 10*3/uL (ref 4.0–10.3)
lymph#: 0.7 10*3/uL — ABNORMAL LOW (ref 0.9–3.3)

## 2016-02-10 LAB — TECHNOLOGIST REVIEW

## 2016-02-10 LAB — PREPARE RBC (CROSSMATCH)

## 2016-02-10 MED ORDER — ACETAMINOPHEN 325 MG PO TABS
ORAL_TABLET | ORAL | Status: AC
  Filled 2016-02-10: qty 2

## 2016-02-10 MED ORDER — DIPHENHYDRAMINE HCL 25 MG PO CAPS
ORAL_CAPSULE | ORAL | Status: AC
  Filled 2016-02-10: qty 1

## 2016-02-10 MED ORDER — DIPHENHYDRAMINE HCL 25 MG PO CAPS
25.0000 mg | ORAL_CAPSULE | Freq: Once | ORAL | Status: AC
  Administered 2016-02-10: 25 mg via ORAL

## 2016-02-10 MED ORDER — SODIUM CHLORIDE 0.9% FLUSH
3.0000 mL | INTRAVENOUS | Status: DC | PRN
  Filled 2016-02-10: qty 10

## 2016-02-10 MED ORDER — ACETAMINOPHEN 325 MG PO TABS
650.0000 mg | ORAL_TABLET | Freq: Once | ORAL | Status: AC
  Administered 2016-02-10: 650 mg via ORAL

## 2016-02-10 MED ORDER — DARBEPOETIN ALFA 500 MCG/ML IJ SOSY
500.0000 ug | PREFILLED_SYRINGE | Freq: Once | INTRAMUSCULAR | Status: AC
  Administered 2016-02-10: 500 ug via SUBCUTANEOUS
  Filled 2016-02-10: qty 1

## 2016-02-10 NOTE — Progress Notes (Signed)
Aranesp Injection given in Infusion Room

## 2016-02-11 ENCOUNTER — Telehealth: Payer: Self-pay | Admitting: Hematology and Oncology

## 2016-02-11 ENCOUNTER — Telehealth: Payer: Self-pay | Admitting: *Deleted

## 2016-02-11 ENCOUNTER — Other Ambulatory Visit: Payer: Self-pay | Admitting: Hematology and Oncology

## 2016-02-11 DIAGNOSIS — D469 Myelodysplastic syndrome, unspecified: Secondary | ICD-10-CM

## 2016-02-11 LAB — TYPE AND SCREEN
ABO/RH(D): A POS
Antibody Screen: NEGATIVE
UNIT DIVISION: 0

## 2016-02-11 NOTE — Telephone Encounter (Signed)
left msg confirming 8/11 & 8/14 apt times

## 2016-02-11 NOTE — Telephone Encounter (Signed)
Per staff message and POF I have scheduled appts. Advised scheduler of appts. JMW  

## 2016-02-11 NOTE — Telephone Encounter (Signed)
Son called to say patient developed a rash yesterday after being on cephalexin for 1 week and wanted to make Dr Alvy Bimler aware. RN noticed it yesterday prior to starting blood transfusion.  Son says they are also considering a port. Pt was stuck 3 times yesterday for IV access. Is going to show Mr Miskin a family member's port and let them tell him about it. Will let us know what they decide.

## 2016-02-11 NOTE — Telephone Encounter (Signed)
1) Stop Keflex (I think he is done) 2) If the rash is not bad, recommend OTC benadryl 12.5 mg TID/prn for itching 3) I will order port but needs to coordinate with IR due to his pancytopenia. He may need platelet transfusion before port. I will place order for IR port for Friday 8/11 but please call and ask what is the platelet threshold sop we can arrange for possible transfusion before the port

## 2016-02-11 NOTE — Telephone Encounter (Signed)
Pt's son notified to be here at 0800, 8/14 for labs and platelets prior to port placement. Verbalized understanding

## 2016-02-18 ENCOUNTER — Other Ambulatory Visit: Payer: Self-pay | Admitting: Radiology

## 2016-02-21 ENCOUNTER — Telehealth: Payer: Self-pay | Admitting: *Deleted

## 2016-02-21 ENCOUNTER — Ambulatory Visit: Payer: Medicare Other

## 2016-02-21 ENCOUNTER — Encounter (HOSPITAL_COMMUNITY): Payer: Self-pay

## 2016-02-21 ENCOUNTER — Other Ambulatory Visit: Payer: Self-pay | Admitting: Hematology and Oncology

## 2016-02-21 ENCOUNTER — Other Ambulatory Visit (HOSPITAL_BASED_OUTPATIENT_CLINIC_OR_DEPARTMENT_OTHER): Payer: Medicare Other

## 2016-02-21 ENCOUNTER — Ambulatory Visit (HOSPITAL_COMMUNITY)
Admission: RE | Admit: 2016-02-21 | Discharge: 2016-02-21 | Disposition: A | Discharge status: Home / Self Care | Payer: Medicare Other | Source: Ambulatory Visit | Attending: Hematology and Oncology | Admitting: Hematology and Oncology

## 2016-02-21 DIAGNOSIS — E039 Hypothyroidism, unspecified: Secondary | ICD-10-CM | POA: Insufficient documentation

## 2016-02-21 DIAGNOSIS — D509 Iron deficiency anemia, unspecified: Secondary | ICD-10-CM | POA: Diagnosis not present

## 2016-02-21 DIAGNOSIS — Z950 Presence of cardiac pacemaker: Secondary | ICD-10-CM | POA: Insufficient documentation

## 2016-02-21 DIAGNOSIS — D696 Thrombocytopenia, unspecified: Secondary | ICD-10-CM | POA: Insufficient documentation

## 2016-02-21 DIAGNOSIS — Z8249 Family history of ischemic heart disease and other diseases of the circulatory system: Secondary | ICD-10-CM | POA: Insufficient documentation

## 2016-02-21 DIAGNOSIS — Z87891 Personal history of nicotine dependence: Secondary | ICD-10-CM | POA: Diagnosis not present

## 2016-02-21 DIAGNOSIS — E1142 Type 2 diabetes mellitus with diabetic polyneuropathy: Secondary | ICD-10-CM | POA: Insufficient documentation

## 2016-02-21 DIAGNOSIS — I442 Atrioventricular block, complete: Secondary | ICD-10-CM | POA: Insufficient documentation

## 2016-02-21 DIAGNOSIS — Z880 Allergy status to penicillin: Secondary | ICD-10-CM | POA: Diagnosis not present

## 2016-02-21 DIAGNOSIS — D469 Myelodysplastic syndrome, unspecified: Secondary | ICD-10-CM | POA: Insufficient documentation

## 2016-02-21 DIAGNOSIS — I447 Left bundle-branch block, unspecified: Secondary | ICD-10-CM | POA: Insufficient documentation

## 2016-02-21 DIAGNOSIS — I10 Essential (primary) hypertension: Secondary | ICD-10-CM | POA: Insufficient documentation

## 2016-02-21 DIAGNOSIS — E1151 Type 2 diabetes mellitus with diabetic peripheral angiopathy without gangrene: Secondary | ICD-10-CM | POA: Insufficient documentation

## 2016-02-21 DIAGNOSIS — Z882 Allergy status to sulfonamides status: Secondary | ICD-10-CM | POA: Diagnosis not present

## 2016-02-21 HISTORY — PX: IR GENERIC HISTORICAL: IMG1180011

## 2016-02-21 LAB — CBC & DIFF AND RETIC
BASO%: 0.5 % (ref 0.0–2.0)
BASOS ABS: 0 10*3/uL (ref 0.0–0.1)
EOS ABS: 0.2 10*3/uL (ref 0.0–0.5)
EOS%: 5.1 % (ref 0.0–7.0)
HEMATOCRIT: 25.4 % — AB (ref 38.4–49.9)
HEMOGLOBIN: 8 g/dL — AB (ref 13.0–17.1)
IMMATURE RETIC FRACT: 14.5 % — AB (ref 3.00–10.60)
LYMPH%: 19.8 % (ref 14.0–49.0)
MCH: 30.1 pg (ref 27.2–33.4)
MCHC: 31.5 g/dL — ABNORMAL LOW (ref 32.0–36.0)
MCV: 95.5 fL (ref 79.3–98.0)
MONO#: 0.5 10*3/uL (ref 0.1–0.9)
MONO%: 12.4 % (ref 0.0–14.0)
NEUT%: 62.2 % (ref 39.0–75.0)
NEUTROS ABS: 2.6 10*3/uL (ref 1.5–6.5)
PLATELETS: 76 10*3/uL — AB (ref 140–400)
RBC: 2.66 10*6/uL — ABNORMAL LOW (ref 4.20–5.82)
RDW: 24 % — ABNORMAL HIGH (ref 11.0–14.6)
Retic %: 2.84 % — ABNORMAL HIGH (ref 0.80–1.80)
Retic Ct Abs: 75.54 10*3/uL (ref 34.80–93.90)
WBC: 4.1 10*3/uL (ref 4.0–10.3)
lymph#: 0.8 10*3/uL — ABNORMAL LOW (ref 0.9–3.3)

## 2016-02-21 LAB — PROTIME-INR
INR: 1.26
PROTHROMBIN TIME: 15.9 s — AB (ref 11.4–15.2)

## 2016-02-21 LAB — APTT: APTT: 33 s (ref 24–36)

## 2016-02-21 LAB — TECHNOLOGIST REVIEW

## 2016-02-21 MED ORDER — MIDAZOLAM HCL 2 MG/2ML IJ SOLN
INTRAMUSCULAR | Status: AC | PRN
  Administered 2016-02-21: 0.5 mg via INTRAVENOUS

## 2016-02-21 MED ORDER — VANCOMYCIN HCL IN DEXTROSE 1-5 GM/200ML-% IV SOLN
1000.0000 mg | INTRAVENOUS | Status: AC
  Administered 2016-02-21: 1000 mg via INTRAVENOUS
  Filled 2016-02-21: qty 200

## 2016-02-21 MED ORDER — FLUMAZENIL 0.5 MG/5ML IV SOLN
INTRAVENOUS | Status: AC
  Filled 2016-02-21: qty 5

## 2016-02-21 MED ORDER — FENTANYL CITRATE (PF) 100 MCG/2ML IJ SOLN
INTRAMUSCULAR | Status: AC | PRN
  Administered 2016-02-21: 25 ug via INTRAVENOUS

## 2016-02-21 MED ORDER — MIDAZOLAM HCL 2 MG/2ML IJ SOLN
INTRAMUSCULAR | Status: AC
  Filled 2016-02-21: qty 2

## 2016-02-21 MED ORDER — HEPARIN SOD (PORK) LOCK FLUSH 100 UNIT/ML IV SOLN
INTRAVENOUS | Status: AC
  Filled 2016-02-21: qty 5

## 2016-02-21 MED ORDER — FENTANYL CITRATE (PF) 100 MCG/2ML IJ SOLN
INTRAMUSCULAR | Status: AC
  Filled 2016-02-21: qty 2

## 2016-02-21 MED ORDER — NALOXONE HCL 0.4 MG/ML IJ SOLN
INTRAMUSCULAR | Status: AC
  Filled 2016-02-21: qty 1

## 2016-02-21 NOTE — Consult Note (Signed)
Chief Complaint: Patient was seen in consultation today for port a cath placement  Referring Physician(s): [place]  Supervising Physician: Arne Cleveland  Patient Status: Outpatient  History of Present Illness: Anthony Hutchinson is a 82 y.o. male with history of MDS/thrombocytoenia and poor venous access who presents today for Port-A-Cath placement for medication administration and prn blood transfusions. Additional medical history as listed below.  Past Medical History:  Diagnosis Date  . Anemia    iron deficiency anemia  . Cancer ([place])   . Cataracts, bilateral   . Complete heart block ([place])   . Diabetes mellitus without complication ([place])   . Hypertension   . Hypothyroidism    nodule  . Inguinal hernia   . LBBB (left bundle branch block)   . MDS (myelodysplastic syndrome) ([place]) 01/05/2014  . Peripheral vascular disease ([place])    Peripheral neuropathy    Past Surgical History:  Procedure Laterality Date  . ABDOMINAL AORTIC ANEURYSM REPAIR    . ACROMIONECTOMY  1998   rotator cuff repair  . FLEXIBLE SIGMOIDOSCOPY N/A 12/13/2012   Procedure: FLEXIBLE SIGMOIDOSCOPY;  Surgeon: Beryle Beams, MD;  Location: [facility];  Service: Endoscopy;  Laterality: N/A;  . FLEXIBLE SIGMOIDOSCOPY N/A 03/12/2015   Procedure: FLEXIBLE SIGMOIDOSCOPY;  Surgeon: Carol Ada, MD;  Location: [facility] ENDOS[facility];  Service: Endoscopy;  Laterality: N/A;  will need APC   . HERNIA REPAIR  1979   bilateral inguinal hernia  . HIATAL HERNIA REPAIR    . HOT HEMOSTASIS N/A 12/13/2012   Procedure: HOT HEMOSTASIS (ARGON PLASMA COAGULATION/BICAP);  Surgeon: Beryle Beams, MD;  Location: [facility];  Service: Endoscopy;  Laterality: N/A;  . HOT HEMOSTASIS N/A 03/12/2015   Procedure: HOT HEMOSTASIS (ARGON PLASMA COAGULATION/BICAP);  Surgeon: Carol Ada, MD;  Location: [facility];  Service: Endoscopy;  Laterality: N/A;  . PACEMAKER INSERTION  11-03-13   STJ dual chamber pacemaker implanted by Dr Lovena Le for  [place]  . PERMANENT PACEMAKER INSERTION N/A 11/03/2013   Procedure: PERMANENT PACEMAKER INSERTION;  Surgeon: Evans Lance, MD;  Location: [facility];  Service: Cardiovascular;  Laterality: N/A;  . PROSTATE SURGERY    . THYROID LOBECTOMY  6/85   right  . TONSILLECTOMY    . TONSILLECTOMY      Allergies: Cephalosporins; Codeine sulfate; and Penicillins  Medications: Prior to Admission medications   Medication Sig Start Date End Date Taking? Authorizing Provider  acetaminophen (TYLENOL) 500 MG tablet Take 500 mg by mouth every 6 (six) hours as needed (pain).   Yes Historical Provider, MD  aminocaproic acid (AMICAR) 500 MG tablet Take 1 tablet (500 mg total) by mouth every 8 (eight) hours as needed for bleeding. 01/27/16  Yes Heath Lark, MD  calcium carbonate (TUMS - DOSED IN MG ELEMENTAL CALCIUM) 500 MG chewable tablet Chew 1 tablet by mouth daily as needed for indigestion or heartburn.   Yes Historical Provider, MD  furosemide (LASIX) 20 MG tablet Take 1 tablet (20 mg total) by mouth daily. 02/02/16  Yes Evans Lance, MD  loperamide (IMODIUM) 2 MG capsule Take 2 mg by mouth daily as needed for diarrhea or loose stools.   Yes Historical Provider, MD  Multiple Vitamin (MULTIVITAMIN WITH MINERALS) TABS Take 1 tablet by mouth every morning.   Yes Historical Provider, MD  pramoxine-hydrocortisone (ANALPRAM HC) cream Place 1 application rectally 4 (four) times daily.   Yes Historical Provider, MD  SYNTHROID 125 MCG tablet Take 125 mcg by mouth daily before breakfast.  09/15/14  Yes Historical Provider, MD  TOPROL XL 25 MG 24 hr tablet Take 25 mg by mouth daily. 12/24/13  Yes Historical Provider, MD  alfuzosin (UROXATRAL) 10 MG 24 hr tablet Take 10 mg by mouth every evening.     Historical Provider, MD  cephALEXin (KEFLEX) 500 MG capsule Take 1 capsule (500 mg total) by mouth 3 (three) times daily. 01/27/16   Heath Lark, MD  docusate sodium (COLACE) 100 MG capsule Take 1 capsule (100 mg total) by mouth 2  (two) times daily. Patient taking differently: Take 100 mg by mouth daily.  01/15/16   Thurnell Lose, MD  furosemide (LASIX) 20 MG tablet Take 1 tablet (20 mg total) by mouth daily. 12/22/15   Evans Lance, MD     Family History  Problem Relation Age of Onset  . Heart disease Mother   . Heart disease Father   . Heart disease Son   . Hyperlipidemia Son     Social History   Social History  . Marital status: Married    Spouse name: N/A  . Number of children: N/A  . Years of education: N/A   Social History Main Topics  . Smoking status: Former Smoker    Types: Pipe    Quit date: 11/27/1963  . Smokeless tobacco: Never Used  . Alcohol use No  . Drug use: No  . Sexual activity: Not Asked   Other Topics Concern  . None   Social History Narrative  . None      Review of Systems currently denies fever, headache, chest pain, abdominal pain, back pain, nausea, vomiting; does report some dyspnea with exertion, cough and rectal bleeding; HOH  Vital Signs: BP (!) 157/61   Pulse 67   Temp 97.7 F (36.5 C) (Oral)   Resp 16   SpO2 100%   Physical Exam patient awake, alert. Chest with basilar crackles and occ wheezes; left chest wall pacer noted; heart with regular rate and rhythm, positive murmur; abdomen soft, positive bowel sounds, nontender. Lower extremities with 2-3+ edema bilaterally. Mallampati Score:     Imaging: No results found.  Labs:  CBC:  Recent Labs  01/27/16 0926 02/02/16 0831 02/10/16 0944 02/21/16 0814  WBC 5.6 4.2 4.2 4.1  HGB 8.6* 7.7* 7.5* 8.0*  HCT 26.7* 24.2* 23.7* 25.4*  PLT 58* 86* 54* 76*    COAGS: No results for input(s): INR, APTT in the last 8760 hours.  BMP:  Recent Labs  05/06/15 1825  12/21/15 1110 01/13/16 2314 01/14/16 0653 01/15/16 0528  NA 139  < > 135 136 135 135  K 4.5  < > 4.4 4.1 3.9 4.2  CL 107  < > 104 107 106 107  CO2 25  --  23 24 25 25   GLUCOSE 154*  < > 256* 124* 100* 108*  BUN 32*  < > 59* 36* 34*  35*  CALCIUM 9.1  --  8.0* 8.5* 8.3* 8.2*  CREATININE 1.56*  < > 2.22* 2.11* 2.12* 2.04*  GFRNONAA 37*  --   --  26* 25* 27*  GFRAA 43*  --   --  30* 29* 31*  < > = values in this interval not displayed.  LIVER FUNCTION TESTS:  Recent Labs  03/13/15 0055 04/25/15 1657 05/06/15 1825 01/13/16 2314  BILITOT 1.7* 1.7* 1.3* 1.3*  AST 48* 48* 78* 55*  ALT 43 42 53 40  ALKPHOS 207* 177* 224* 202*  PROT 6.5 7.1 7.2 6.4*  ALBUMIN  3.2* 3.6 3.9 3.4*    TUMOR MARKERS: No results for input(s): AFPTM, CEA, CA199, CHROMGRNA in the last 8760 hours.  Assessment and Plan:  82 y.o. male with history of MDS/thrombocytoenia and poor venous access who presents today for Port-A-Cath placement for medication administration and prn blood transfusions.Risks and benefits discussed with the patient/son including, but not limited to bleeding, infection, pneumothorax, or fibrin sheath development and need for additional procedures.All of the patient's questions were answered, patient is agreeable to proceed.Consent signed and in chart. Patient currently afebrile with normal WBC and platelet count of 76k.     Thank you for this interesting consult.  I greatly enjoyed meeting Anthony Hutchinson and look forward to participating in their care.  A copy of this report was sent to the requesting provider on this date.  Electronically Signed: D. Rowe Robert 02/21/2016, 1:38 PM   I spent a total of 20 minutes  in face to face in clinical consultation, greater than 50% of which was counseling/coordinating care for port a cath placement

## 2016-02-21 NOTE — Procedures (Signed)
R IJ Port cathter placement with US and fluoroscopy No complication No blood loss. See complete dictation in [facility] PACS.  

## 2016-02-21 NOTE — Telephone Encounter (Signed)
-----   Message from Heath Lark, MD sent at 02/21/2016  4:03 PM EDT ----- Regarding: call in EMLA Can you give directions and call in prescription EMLA cream for him to use in the future? Thanks ----- Message ----- From: Interface, Rad Results In Sent: 02/21/2016   3:40 PM To: Heath Lark, MD

## 2016-02-21 NOTE — Discharge Instructions (Signed)
Implanted Port Insertion An implanted port is a central line that has a round shape and is placed under the skin. It is used as a long-term IV access for:   Medicines, such as chemotherapy.   Fluids.   Liquid nutrition, such as total parenteral nutrition (TPN).   Blood samples.  LET [facility] CARE PROVIDER KNOW ABOUT:  Allergies to food or medicine.   Medicines taken, including vitamins, herbs, eye drops, creams, and over-the-counter medicines.   Any allergies to heparin.  Use of steroids (by mouth or creams).   Previous problems with anesthetics or numbing medicines.   History of bleeding problems or blood clots.   Previous surgery.   Other health problems, including diabetes and kidney problems.   Possibility of pregnancy, if this applies. RISKS AND COMPLICATIONS Generally, this is a safe procedure. However, as with any procedure, problems can occur. Possible problems include:  Damage to the blood vessel, bruising, or bleeding at the puncture site.   Infection.  Blood clot in the vessel that the port is in.  Breakdown of the skin over your port.  Very rarely a person may develop a condition called a pneumothorax, a collection of air in the chest that may cause one of the lungs to collapse. The placement of these catheters with the appropriate imaging guidance significantly decreases the risk of a pneumothorax.  BEFORE THE PROCEDURE   Your health care provider may want you to have blood tests. These tests can help tell how well your kidneys and liver are working. They can also show how well your blood clots.   If you take blood thinners (anticoagulant medicines), ask your health care provider when you should stop taking them.   Make arrangements for someone to drive you home. This is necessary if you have been sedated for your procedure.  PROCEDURE  Port insertion usually takes about 30-45 minutes.   An IV needle will be inserted in your arm.  Medicine for pain and medicine to help relax you (sedative) will flow directly into your body through this needle.   You will lie on an exam table, and you will be connected to monitors to keep track of your heart rate, blood pressure, and breathing throughout the procedure.  An oxygen monitoring device may be attached to your finger. Oxygen will be given.   Everything will be kept as germ free (sterile) as possible during the procedure. The skin near the point of the incision will be cleansed with antiseptic, and the area will be draped with sterile towels. The skin and deeper tissues over the port area will be made numb with a local anesthetic.  Two small cuts (incisions) will be made in the skin to insert the port. One will be made in the neck to obtain access to the vein where the catheter will lie.   Because the port reservoir will be placed under the skin, a small skin incision will be made in the upper chest, and a small pocket for the port will be made under the skin. The catheter that will be connected to the port tunnels to a large central vein in the chest. A small, raised area will remain on your body at the site of the reservoir when the procedure is complete.  The port placement will be done under imaging guidance to ensure the proper placement.  The reservoir has a silicone covering that can be punctured with a special needle.   The port will be flushed with normal  saline, and blood will be drawn to make sure it is working properly.  There will be nothing remaining outside the skin when the procedure is finished.   Incisions will be held together by stitches, surgical glue, or a special tape. AFTER THE PROCEDURE  You will stay in a recovery area until the anesthesia has worn off. Your blood pressure and pulse will be checked.  A final chest X-ray will be taken to check the placement of the port and to ensure that there is no injury to your lung.   This information is  not intended to replace advice given to you by your health care provider. Make sure you discuss any questions you have with your health care provider.   Document Released: 04/16/2013 Document Revised: 07/17/2014 Document Reviewed: 04/16/2013 Elsevier Interactive Patient Education 2016 [employer] Insertion, Care After Refer to this sheet in the next few weeks. These instructions provide you with information on caring for yourself after your procedure. Your health care provider may also give you more specific instructions. Your treatment has been planned according to current medical practices, but problems sometimes occur. Call your health care provider if you have any problems or questions after your procedure. WHAT TO EXPECT AFTER THE PROCEDURE After your procedure, it is typical to have the following:   Discomfort at the port insertion site. Ice packs to the area will help.  Bruising on the skin over the port. This will subside in 3-4 days. HOME CARE INSTRUCTIONS  After your port is placed, you will get a manufacturer's information card. The card has information about your port. Keep this card with you at all times.   Know what kind of port you have. There are many types of ports available.   Wear a medical alert bracelet in case of an emergency. This can help alert health care workers that you have a port.   The port can stay in for as long as your health care provider believes it is necessary.   A home health care nurse may give medicines and take care of the port.   You or a family member can get special training and directions for giving medicine and taking care of the port at home.  SEEK MEDICAL CARE IF:   Your port does not flush or you are unable to get a blood return.   You have a fever or chills. SEEK IMMEDIATE MEDICAL CARE IF:  You have new fluid or pus coming from your incision.   You notice a bad smell coming from your incision site.    You have swelling, pain, or more redness at the incision or port site.   You have chest pain or shortness of breath.   This information is not intended to replace advice given to you by your health care provider. Make sure you discuss any questions you have with your health care provider.   Document Released: 04/16/2013 Document Revised: 07/01/2013 Document Reviewed: 04/16/2013 Elsevier Interactive Patient Education 2016 Elsevier Inc.  Moderate Conscious Sedation, Adult, Care After Refer to this sheet in the next few weeks. These instructions provide you with information on caring for yourself after your procedure. Your health care provider may also give you more specific instructions. Your treatment has been planned according to current medical practices, but problems sometimes occur. Call your health care provider if you have any problems or questions after your procedure. WHAT TO EXPECT AFTER THE PROCEDURE  After your procedure:  You may  feel sleepy, clumsy, and have poor balance for several hours.  Vomiting may occur if you eat too soon after the procedure. HOME CARE INSTRUCTIONS  Do not participate in any activities where you could become injured for at least 24 hours. Do not:  Drive.  Swim.  Ride a bicycle.  Operate heavy machinery.  Cook.  Use power tools.  Climb ladders.  Work from a high place.  Do not make important decisions or sign legal documents until you are improved.  If you vomit, drink water, juice, or soup when you can drink without vomiting. Make sure you have little or no nausea before eating solid foods.  Only take over-the-counter or prescription medicines for pain, discomfort, or fever as directed by your health care provider.  Make sure you and your family fully understand everything about the medicines given to you, including what side effects may occur.  You should not drink alcohol, take sleeping pills, or take medicines that cause  drowsiness for at least 24 hours.  If you smoke, do not smoke without supervision.  If you are feeling better, you may resume normal activities 24 hours after you were sedated.  Keep all appointments with your health care provider. SEEK MEDICAL CARE IF:  Your skin is pale or bluish in color.  You continue to feel nauseous or vomit.  Your pain is getting worse and is not helped by medicine.  You have bleeding or swelling.  You are still sleepy or feeling clumsy after 24 hours. SEEK IMMEDIATE MEDICAL CARE IF:  You develop a rash.  You have difficulty breathing.  You develop any type of allergic problem.  You have a fever. MAKE SURE YOU:  Understand these instructions.  Will watch your condition.  Will get help right away if you are not doing well or get worse.   This information is not intended to replace advice given to you by your health care provider. Make sure you discuss any questions you have with your health care provider.   Document Released: 04/16/2013 Document Revised: 07/17/2014 Document Reviewed: 04/16/2013 Elsevier Interactive Patient Education [employer].

## 2016-02-21 NOTE — Progress Notes (Signed)
Pt platelets was 76. Notified Dr. Alvy Bimler per Charlies Silvers. Pt does not require any platelets today. Confirmed with IR. Pt okay to go home for now until his appt for port placement this afternoon.

## 2016-02-22 MED ORDER — LIDOCAINE-PRILOCAINE 2.5-2.5 % EX CREA: 1.0000 "application " | TOPICAL_CREAM | CUTANEOUS | 0 refills | Status: DC | PRN

## 2016-02-22 MED FILL — LIDOCAINE-PRILOCAINE CREAM: 2.5-2.5 | 14 days supply | Qty: 30 | Fill #0

## 2016-02-22 NOTE — Telephone Encounter (Signed)
Son notified of new rx for EMLA cream. Instructed on use. Will send to [facility].  Flush appts to be added to lab days

## 2016-02-24 ENCOUNTER — Other Ambulatory Visit (HOSPITAL_BASED_OUTPATIENT_CLINIC_OR_DEPARTMENT_OTHER): Payer: Medicare Other

## 2016-02-24 ENCOUNTER — Other Ambulatory Visit: Payer: Self-pay | Admitting: Hematology and Oncology

## 2016-02-24 ENCOUNTER — Ambulatory Visit (HOSPITAL_BASED_OUTPATIENT_CLINIC_OR_DEPARTMENT_OTHER): Payer: Medicare Other

## 2016-02-24 ENCOUNTER — Ambulatory Visit: Payer: Medicare Other

## 2016-02-24 VITALS — BP 146/59 | HR 59 | Temp 98.2°F | Resp 20

## 2016-02-24 DIAGNOSIS — D469 Myelodysplastic syndrome, unspecified: Secondary | ICD-10-CM

## 2016-02-24 DIAGNOSIS — N189 Chronic kidney disease, unspecified: Secondary | ICD-10-CM

## 2016-02-24 DIAGNOSIS — D631 Anemia in chronic kidney disease: Secondary | ICD-10-CM

## 2016-02-24 DIAGNOSIS — D630 Anemia in neoplastic disease: Secondary | ICD-10-CM

## 2016-02-24 DIAGNOSIS — N183 Chronic kidney disease, stage 3 (moderate): Secondary | ICD-10-CM

## 2016-02-24 LAB — CBC & DIFF AND RETIC
BASO%: 0.5 % (ref 0.0–2.0)
BASOS ABS: 0 10*3/uL (ref 0.0–0.1)
EOS ABS: 0.2 10*3/uL (ref 0.0–0.5)
EOS%: 3.9 % (ref 0.0–7.0)
HCT: 24.1 % — ABNORMAL LOW (ref 38.4–49.9)
HEMOGLOBIN: 7.7 g/dL — AB (ref 13.0–17.1)
IMMATURE RETIC FRACT: 7.7 % (ref 3.00–10.60)
LYMPH%: 22.7 % (ref 14.0–49.0)
MCH: 30.1 pg (ref 27.2–33.4)
MCHC: 32 g/dL (ref 32.0–36.0)
MCV: 94.1 fL (ref 79.3–98.0)
MONO#: 0.3 10*3/uL (ref 0.1–0.9)
MONO%: 6.3 % (ref 0.0–14.0)
NEUT%: 66.6 % (ref 39.0–75.0)
NEUTROS ABS: 2.8 10*3/uL (ref 1.5–6.5)
NRBC: 0 % (ref 0–0)
Platelets: 42 10*3/uL — ABNORMAL LOW (ref 140–400)
RBC: 2.56 10*6/uL — AB (ref 4.20–5.82)
RDW: 23.5 % — AB (ref 11.0–14.6)
RETIC CT ABS: 39.17 10*3/uL (ref 34.80–93.90)
Retic %: 1.53 % (ref 0.80–1.80)
WBC: 4.2 10*3/uL (ref 4.0–10.3)
lymph#: 0.9 10*3/uL (ref 0.9–3.3)

## 2016-02-24 LAB — TECHNOLOGIST REVIEW

## 2016-02-24 LAB — PREPARE RBC (CROSSMATCH)

## 2016-02-24 MED ORDER — HEPARIN SOD (PORK) LOCK FLUSH 100 UNIT/ML IV SOLN
500.0000 [IU] | Freq: Every day | INTRAVENOUS | Status: AC | PRN
  Administered 2016-02-24: 500 [IU]
  Filled 2016-02-24: qty 5

## 2016-02-24 MED ORDER — DIPHENHYDRAMINE HCL 25 MG PO CAPS
ORAL_CAPSULE | ORAL | Status: AC
  Filled 2016-02-24: qty 1

## 2016-02-24 MED ORDER — DARBEPOETIN ALFA 500 MCG/ML IJ SOSY
500.0000 ug | PREFILLED_SYRINGE | Freq: Once | INTRAMUSCULAR | Status: AC
  Administered 2016-02-24: 500 ug via SUBCUTANEOUS
  Filled 2016-02-24: qty 1

## 2016-02-24 MED ORDER — SODIUM CHLORIDE 0.9% FLUSH
10.0000 mL | INTRAVENOUS | Status: AC | PRN
  Administered 2016-02-24: 10 mL
  Filled 2016-02-24: qty 10

## 2016-02-24 MED ORDER — SODIUM CHLORIDE 0.9% FLUSH
10.0000 mL | INTRAVENOUS | Status: DC | PRN
  Filled 2016-02-24: qty 10

## 2016-02-24 MED ORDER — ACETAMINOPHEN 325 MG PO TABS
ORAL_TABLET | ORAL | Status: AC
  Filled 2016-02-24: qty 2

## 2016-02-24 MED ORDER — ACETAMINOPHEN 325 MG PO TABS
650.0000 mg | ORAL_TABLET | Freq: Once | ORAL | Status: AC
  Administered 2016-02-24: 650 mg via ORAL

## 2016-02-24 MED ORDER — SODIUM CHLORIDE 0.9% FLUSH
3.0000 mL | INTRAVENOUS | Status: DC | PRN
  Filled 2016-02-24: qty 10

## 2016-02-24 MED ORDER — SODIUM CHLORIDE 0.9 % IV SOLN
250.0000 mL | Freq: Once | INTRAVENOUS | Status: AC
  Administered 2016-02-24: 250 mL via INTRAVENOUS

## 2016-02-24 MED ORDER — HEPARIN SOD (PORK) LOCK FLUSH 100 UNIT/ML IV SOLN
250.0000 [IU] | INTRAVENOUS | Status: DC | PRN
  Filled 2016-02-24: qty 5

## 2016-02-24 MED ORDER — DIPHENHYDRAMINE HCL 25 MG PO CAPS
25.0000 mg | ORAL_CAPSULE | Freq: Once | ORAL | Status: AC
  Administered 2016-02-24: 25 mg via ORAL

## 2016-02-24 NOTE — Patient Instructions (Signed)
Blood Transfusion, Care After  These instructions give you information about caring for yourself after your procedure. Your doctor may also give you more specific instructions. Call your doctor if you have any problems or questions after your procedure.   HOME CARE   Take medicines only as told by your doctor. Ask your doctor if you can take an over-the-counter pain reliever if you have a fever or headache a day or two after your procedure.   Return to your normal activities as told by your doctor.  GET HELP IF:    You develop redness or irritation at your IV site.   You have a fever, chills, or a headache that does not go away.   Your pee (urine) is darker than normal.   Your urine turns:    Pink.    Red.    Brown.   The white part of your eye turns yellow (jaundice).   You feel weak after doing your normal activities.  GET HELP RIGHT AWAY IF:    You have trouble breathing.   You have fever and chills and you also have:    Anxiety.    Chest or back pain.    Flushed or pink skin.    Clammy or sweaty skin.    A fast heartbeat.    A sick feeling in your stomach (nausea).     This information is not intended to replace advice given to you by your health care provider. Make sure you discuss any questions you have with your health care provider.     Document Released: 07/17/2014 Document Reviewed: 07/17/2014  Elsevier Interactive Patient Education 2016 Elsevier Inc.

## 2016-02-24 NOTE — Progress Notes (Signed)
Aranesp injection to be given in Infusion room

## 2016-02-25 LAB — TYPE AND SCREEN
ABO/RH(D): A POS
ANTIBODY SCREEN: NEGATIVE
Unit division: 0

## 2016-03-08 ENCOUNTER — Ambulatory Visit (INDEPENDENT_AMBULATORY_CARE_PROVIDER_SITE_OTHER): Payer: Medicare Other | Admitting: Podiatry

## 2016-03-08 DIAGNOSIS — M79676 Pain in unspecified toe(s): Secondary | ICD-10-CM | POA: Diagnosis not present

## 2016-03-08 DIAGNOSIS — B351 Tinea unguium: Secondary | ICD-10-CM | POA: Diagnosis not present

## 2016-03-08 DIAGNOSIS — G609 Hereditary and idiopathic neuropathy, unspecified: Secondary | ICD-10-CM

## 2016-03-09 ENCOUNTER — Ambulatory Visit: Payer: Medicare Other

## 2016-03-09 ENCOUNTER — Other Ambulatory Visit (HOSPITAL_BASED_OUTPATIENT_CLINIC_OR_DEPARTMENT_OTHER): Payer: Medicare Other

## 2016-03-09 ENCOUNTER — Ambulatory Visit (HOSPITAL_BASED_OUTPATIENT_CLINIC_OR_DEPARTMENT_OTHER): Payer: Medicare Other

## 2016-03-09 VITALS — BP 129/46 | HR 61 | Temp 97.7°F | Resp 18

## 2016-03-09 VITALS — BP 134/55 | HR 65 | Temp 98.3°F | Resp 18

## 2016-03-09 DIAGNOSIS — D630 Anemia in neoplastic disease: Secondary | ICD-10-CM

## 2016-03-09 DIAGNOSIS — D469 Myelodysplastic syndrome, unspecified: Secondary | ICD-10-CM

## 2016-03-09 DIAGNOSIS — D631 Anemia in chronic kidney disease: Secondary | ICD-10-CM

## 2016-03-09 DIAGNOSIS — N183 Chronic kidney disease, stage 3 (moderate): Secondary | ICD-10-CM

## 2016-03-09 DIAGNOSIS — Z95828 Presence of other vascular implants and grafts: Secondary | ICD-10-CM | POA: Insufficient documentation

## 2016-03-09 LAB — CBC & DIFF AND RETIC
BASO%: 0.4 % (ref 0.0–2.0)
BASOS ABS: 0 10*3/uL (ref 0.0–0.1)
EOS ABS: 0.1 10*3/uL (ref 0.0–0.5)
EOS%: 3 % (ref 0.0–7.0)
HCT: 25 % — ABNORMAL LOW (ref 38.4–49.9)
HGB: 8.1 g/dL — ABNORMAL LOW (ref 13.0–17.1)
IMMATURE RETIC FRACT: 10.2 % (ref 3.00–10.60)
LYMPH#: 0.9 10*3/uL (ref 0.9–3.3)
LYMPH%: 19.5 % (ref 14.0–49.0)
MCH: 30 pg (ref 27.2–33.4)
MCHC: 32.4 g/dL (ref 32.0–36.0)
MCV: 92.6 fL (ref 79.3–98.0)
MONO#: 0.5 10*3/uL (ref 0.1–0.9)
MONO%: 10.4 % (ref 0.0–14.0)
NEUT%: 66.7 % (ref 39.0–75.0)
NEUTROS ABS: 3.2 10*3/uL (ref 1.5–6.5)
NRBC: 0 % (ref 0–0)
Platelets: 46 10*3/uL — ABNORMAL LOW (ref 140–400)
RBC: 2.7 10*6/uL — AB (ref 4.20–5.82)
RDW: 22.3 % — AB (ref 11.0–14.6)
RETIC %: 1.9 % — AB (ref 0.80–1.80)
RETIC CT ABS: 51.3 10*3/uL (ref 34.80–93.90)
WBC: 4.7 10*3/uL (ref 4.0–10.3)

## 2016-03-09 LAB — PREPARE RBC (CROSSMATCH)

## 2016-03-09 MED ORDER — DIPHENHYDRAMINE HCL 25 MG PO CAPS
25.0000 mg | ORAL_CAPSULE | Freq: Once | ORAL | Status: AC
  Administered 2016-03-09: 25 mg via ORAL

## 2016-03-09 MED ORDER — SODIUM CHLORIDE 0.9 % IJ SOLN
10.0000 mL | INTRAMUSCULAR | Status: DC | PRN
  Administered 2016-03-09: 10 mL via INTRAVENOUS
  Filled 2016-03-09: qty 10

## 2016-03-09 MED ORDER — ACETAMINOPHEN 325 MG PO TABS
ORAL_TABLET | ORAL | Status: AC
  Filled 2016-03-09: qty 2

## 2016-03-09 MED ORDER — HEPARIN SOD (PORK) LOCK FLUSH 100 UNIT/ML IV SOLN
500.0000 [IU] | Freq: Every day | INTRAVENOUS | Status: AC | PRN
  Administered 2016-03-09: 500 [IU]
  Filled 2016-03-09: qty 5

## 2016-03-09 MED ORDER — DARBEPOETIN ALFA 500 MCG/ML IJ SOSY
500.0000 ug | PREFILLED_SYRINGE | Freq: Once | INTRAMUSCULAR | Status: AC
  Administered 2016-03-09: 500 ug via SUBCUTANEOUS
  Filled 2016-03-09: qty 1

## 2016-03-09 MED ORDER — SODIUM CHLORIDE 0.9% FLUSH
10.0000 mL | INTRAVENOUS | Status: AC | PRN
  Administered 2016-03-09: 10 mL
  Filled 2016-03-09: qty 10

## 2016-03-09 MED ORDER — ACETAMINOPHEN 325 MG PO TABS
650.0000 mg | ORAL_TABLET | Freq: Once | ORAL | Status: AC
  Administered 2016-03-09: 650 mg via ORAL

## 2016-03-09 MED ORDER — DIPHENHYDRAMINE HCL 25 MG PO CAPS
ORAL_CAPSULE | ORAL | Status: AC
  Filled 2016-03-09: qty 1

## 2016-03-09 MED ORDER — SODIUM CHLORIDE 0.9 % IV SOLN
250.0000 mL | Freq: Once | INTRAVENOUS | Status: AC
  Administered 2016-03-09: 250 mL via INTRAVENOUS

## 2016-03-09 NOTE — Progress Notes (Signed)
Patient ID: Anthony Hutchinson, male   DOB: [birth year], 82 y.o.   MRN: [id number]     Subjective: This patient presents for scheduled visit complaining of thickened and elongated toenails and walking wearing shoes and requests nail debridement Patient's daughter present treatment room  Objective: Orientated 3 No open skin lesions bilaterally Spastic lower extremity Toenails are brittle, discolored, elongated, deformed 6-10  Assessment: Mycotic toenails 6-10 Idiopathic peripheral neuropathy  Plan: Debridement toenails 6-10 mechanically and electrically without any bleeding  Reappoint 3 months

## 2016-03-09 NOTE — Patient Instructions (Signed)
Blood Transfusion, Care After °Refer to this sheet in the next few weeks. These instructions provide you with information about caring for yourself after your procedure. Your health care provider may also give you more specific instructions. Your treatment has been planned according to current medical practices, but problems sometimes occur. Call your health care provider if you have any problems or questions after your procedure. °WHAT TO EXPECT AFTER THE PROCEDURE °After your procedure, it is common to have: °· Bruising and soreness at the IV site. °· Chills or fever. °· Headache. °HOME CARE INSTRUCTIONS °· Take medicines only as directed by your health care provider. Ask your health care provider if you can take an over-the-counter pain reliever in case you have a fever or headache a day or two after your transfusion. °· Return to your normal activities as directed by your health care provider. °SEEK MEDICAL CARE IF:  °· You develop redness or irritation at your IV site. °· You have persistent fever, chills, or headache. °· Your urine is darker than normal. °· Your urine turns pink, red, or brown.   °· The white part of your eye turns yellow (jaundice).   °· You feel weak after doing your normal activities.   °SEEK IMMEDIATE MEDICAL CARE IF:  °· You have trouble breathing. °· You have fever and chills along with: °¨ Anxiety. °¨ Chest or back pain. °¨ Flushed skin. °¨ Clammy skin. °¨ A rapid heartbeat. °¨ Nausea. °  °This information is not intended to replace advice given to you by your health care provider. Make sure you discuss any questions you have with your health care provider. °  °Document Released: 07/17/2014 Document Reviewed: 07/17/2014 °Elsevier Interactive Patient Education ©2016 Elsevier Inc. ° °

## 2016-03-09 NOTE — Patient Instructions (Signed)
Darbepoetin Alfa injection What is this medicine? DARBEPOETIN ALFA (dar be POE e tin AL fa) helps your body make more red blood cells. It is used to treat anemia caused by chronic kidney failure and chemotherapy. This medicine may be used for other purposes; ask your health care provider or pharmacist if you have questions. COMMON BRAND NAME(S): Aranesp What should I tell my health care provider before I take this medicine? They need to know if you have any of these conditions: -blood clotting disorders or history of blood clots -cancer patient not on chemotherapy -cystic fibrosis -heart disease, such as angina, heart failure, or a history of a heart attack -hemoglobin level of 12 g/dL or greater -high blood pressure -low levels of folate, iron, or vitamin B12 -seizures -an unusual or allergic reaction to darbepoetin, erythropoietin, albumin, hamster proteins, latex, other medicines, foods, dyes, or preservatives -pregnant or trying to get pregnant -breast-feeding How should I use this medicine? This medicine is for injection into a vein or under the skin. It is usually given by a health care professional in a hospital or clinic setting. If you get this medicine at home, you will be taught how to prepare and give this medicine. Do not shake the solution before you withdraw a dose. Use exactly as directed. Take your medicine at regular intervals. Do not take your medicine more often than directed. It is important that you put your used needles and syringes in a special sharps container. Do not put them in a trash can. If you do not have a sharps container, call your pharmacist or healthcare provider to get one. Talk to your pediatrician regarding the use of this medicine in children. While this medicine may be used in children as young as 1 year for selected conditions, precautions do apply. Overdosage: If you think you have taken too much of this medicine contact a poison control center or  emergency room at once. NOTE: This medicine is only for you. Do not share this medicine with others. What if I miss a dose? If you miss a dose, take it as soon as you can. If it is almost time for your next dose, take only that dose. Do not take double or extra doses. What may interact with this medicine? Do not take this medicine with any of the following medications: -epoetin alfa This list may not describe all possible interactions. Give your health care provider a list of all the medicines, herbs, non-prescription drugs, or dietary supplements you use. Also tell them if you smoke, drink alcohol, or use illegal drugs. Some items may interact with your medicine. What should I watch for while using this medicine? Visit your prescriber or health care professional for regular checks on your progress and for the needed blood tests and blood pressure measurements. It is especially important for the doctor to make sure your hemoglobin level is in the desired range, to limit the risk of potential side effects and to give you the best benefit. Keep all appointments for any recommended tests. Check your blood pressure as directed. Ask your doctor what your blood pressure should be and when you should contact him or her. As your body makes more red blood cells, you may need to take iron, folic acid, or vitamin B supplements. Ask your doctor or health care provider which products are right for you. If you have kidney disease continue dietary restrictions, even though this medication can make you feel better. Talk with your doctor or health   care professional about the foods you eat and the vitamins that you take. What side effects may I notice from receiving this medicine? Side effects that you should report to your doctor or health care professional as soon as possible: -allergic reactions like skin rash, itching or hives, swelling of the face, lips, or tongue -breathing problems -changes in vision -chest  pain -confusion, trouble speaking or understanding -feeling faint or lightheaded, falls -high blood pressure -muscle aches or pains -pain, swelling, warmth in the leg -rapid weight gain -severe headaches -sudden numbness or weakness of the face, arm or leg -trouble walking, dizziness, loss of balance or coordination -seizures (convulsions) -swelling of the ankles, feet, hands -unusually weak or tired Side effects that usually do not require medical attention (report to your doctor or health care professional if they continue or are bothersome): -diarrhea -fever, chills (flu-like symptoms) -headaches -nausea, vomiting -redness, stinging, or swelling at site where injected This list may not describe all possible side effects. Call your doctor for medical advice about side effects. You may report side effects to FDA at 1-800-FDA-1088. Where should I keep my medicine? Keep out of the reach of children. Store in a refrigerator between 2 and 8 degrees C (36 and 46 degrees F). Do not freeze. Do not shake. Throw away any unused portion if using a single-dose vial. Throw away any unused medicine after the expiration date. NOTE: This sheet is a summary. It may not cover all possible information. If you have questions about this medicine, talk to your doctor, pharmacist, or health care provider.  2015, [employer]vier/[employer] Standard. (2008-06-09 10:23:57)  

## 2016-03-10 LAB — TYPE AND SCREEN
ABO/RH(D): A POS
Antibody Screen: NEGATIVE
Unit division: 0

## 2016-03-14 ENCOUNTER — Telehealth: Payer: Self-pay | Admitting: Cardiology

## 2016-03-14 ENCOUNTER — Encounter: Payer: Medicare Other | Admitting: *Deleted

## 2016-03-14 NOTE — Telephone Encounter (Signed)
LMOVM reminding pt to send remote transmission.   

## 2016-03-17 ENCOUNTER — Encounter: Payer: Self-pay | Admitting: Cardiology

## 2016-03-17 MED FILL — AMICAR 500 MG TABLET: 500 | 10 days supply | Qty: 30 | Fill #1

## 2016-03-23 ENCOUNTER — Ambulatory Visit (INDEPENDENT_AMBULATORY_CARE_PROVIDER_SITE_OTHER): Payer: Medicare Other | Admitting: *Deleted

## 2016-03-23 ENCOUNTER — Ambulatory Visit (HOSPITAL_BASED_OUTPATIENT_CLINIC_OR_DEPARTMENT_OTHER): Payer: Medicare Other

## 2016-03-23 ENCOUNTER — Ambulatory Visit (HOSPITAL_COMMUNITY)
Admission: RE | Admit: 2016-03-23 | Discharge: 2016-03-23 | Disposition: A | Discharge status: Home / Self Care | Payer: Medicare Other | Source: Ambulatory Visit | Attending: Hematology and Oncology | Admitting: Hematology and Oncology

## 2016-03-23 ENCOUNTER — Ambulatory Visit: Payer: Medicare Other

## 2016-03-23 ENCOUNTER — Other Ambulatory Visit (HOSPITAL_BASED_OUTPATIENT_CLINIC_OR_DEPARTMENT_OTHER): Payer: Medicare Other

## 2016-03-23 ENCOUNTER — Other Ambulatory Visit: Payer: Self-pay | Admitting: Hematology and Oncology

## 2016-03-23 VITALS — BP 138/88 | HR 60 | Temp 98.0°F | Resp 16

## 2016-03-23 DIAGNOSIS — I442 Atrioventricular block, complete: Secondary | ICD-10-CM

## 2016-03-23 DIAGNOSIS — N183 Chronic kidney disease, stage 3 (moderate): Secondary | ICD-10-CM

## 2016-03-23 DIAGNOSIS — D469 Myelodysplastic syndrome, unspecified: Secondary | ICD-10-CM

## 2016-03-23 DIAGNOSIS — D631 Anemia in chronic kidney disease: Secondary | ICD-10-CM

## 2016-03-23 DIAGNOSIS — Z95828 Presence of other vascular implants and grafts: Secondary | ICD-10-CM

## 2016-03-23 DIAGNOSIS — D630 Anemia in neoplastic disease: Secondary | ICD-10-CM

## 2016-03-23 LAB — CBC & DIFF AND RETIC
BASO%: 0.3 % (ref 0.0–2.0)
Basophils Absolute: 0 10*3/uL (ref 0.0–0.1)
EOS%: 2.2 % (ref 0.0–7.0)
Eosinophils Absolute: 0.1 10*3/uL (ref 0.0–0.5)
HCT: 23.6 % — ABNORMAL LOW (ref 38.4–49.9)
HGB: 7.6 g/dL — ABNORMAL LOW (ref 13.0–17.1)
Immature Retic Fract: 8.7 % (ref 3.00–10.60)
LYMPH%: 20.3 % (ref 14.0–49.0)
MCH: 30.2 pg (ref 27.2–33.4)
MCHC: 32.2 g/dL (ref 32.0–36.0)
MCV: 93.7 fL (ref 79.3–98.0)
MONO#: 0.3 10*3/uL (ref 0.1–0.9)
MONO%: 8.4 % (ref 0.0–14.0)
NEUT%: 68.8 % (ref 39.0–75.0)
NEUTROS ABS: 2.6 10*3/uL (ref 1.5–6.5)
PLATELETS: 39 10*3/uL — AB (ref 140–400)
RBC: 2.52 10*6/uL — AB (ref 4.20–5.82)
RDW: 22.1 % — ABNORMAL HIGH (ref 11.0–14.6)
Retic %: 1.59 % (ref 0.80–1.80)
Retic Ct Abs: 40.07 10*3/uL (ref 34.80–93.90)
WBC: 3.7 10*3/uL — AB (ref 4.0–10.3)
lymph#: 0.8 10*3/uL — ABNORMAL LOW (ref 0.9–3.3)

## 2016-03-23 LAB — TECHNOLOGIST REVIEW

## 2016-03-23 MED ORDER — SODIUM CHLORIDE 0.9 % IJ SOLN
10.0000 mL | INTRAMUSCULAR | Status: DC | PRN
  Administered 2016-03-23: 10 mL via INTRAVENOUS
  Filled 2016-03-23: qty 10

## 2016-03-23 MED ORDER — ACETAMINOPHEN 325 MG PO TABS
650.0000 mg | ORAL_TABLET | Freq: Once | ORAL | Status: AC
  Administered 2016-03-23: 650 mg via ORAL

## 2016-03-23 MED ORDER — ACETAMINOPHEN 325 MG PO TABS
ORAL_TABLET | ORAL | Status: AC
  Filled 2016-03-23: qty 2

## 2016-03-23 MED ORDER — DARBEPOETIN ALFA 500 MCG/ML IJ SOSY
500.0000 ug | PREFILLED_SYRINGE | Freq: Once | INTRAMUSCULAR | Status: AC
  Administered 2016-03-23: 500 ug via SUBCUTANEOUS
  Filled 2016-03-23: qty 1

## 2016-03-23 MED ORDER — SODIUM CHLORIDE 0.9 % IV SOLN
250.0000 mL | Freq: Once | INTRAVENOUS | Status: AC
  Administered 2016-03-23: 250 mL via INTRAVENOUS

## 2016-03-23 MED ORDER — HEPARIN SOD (PORK) LOCK FLUSH 100 UNIT/ML IV SOLN
500.0000 [IU] | Freq: Every day | INTRAVENOUS | Status: AC | PRN
  Administered 2016-03-23: 500 [IU]
  Filled 2016-03-23: qty 5

## 2016-03-23 MED ORDER — SODIUM CHLORIDE 0.9% FLUSH
10.0000 mL | INTRAVENOUS | Status: AC | PRN
  Administered 2016-03-23: 10 mL
  Filled 2016-03-23: qty 10

## 2016-03-23 NOTE — Progress Notes (Signed)
Aranesp Injection given in Infusion Room.

## 2016-03-23 NOTE — Patient Instructions (Signed)

## 2016-03-23 NOTE — Patient Instructions (Signed)

## 2016-03-24 ENCOUNTER — Encounter: Payer: Self-pay | Admitting: Cardiology

## 2016-03-24 LAB — TYPE AND SCREEN
ABO/RH(D): A POS
Antibody Screen: NEGATIVE
UNIT DIVISION: 0

## 2016-03-24 NOTE — Progress Notes (Signed)
Remote pacemaker transmission.   

## 2016-04-04 ENCOUNTER — Emergency Department (HOSPITAL_COMMUNITY): Payer: Medicare Other

## 2016-04-04 ENCOUNTER — Other Ambulatory Visit: Payer: Self-pay

## 2016-04-04 ENCOUNTER — Encounter (HOSPITAL_COMMUNITY): Payer: Self-pay

## 2016-04-04 ENCOUNTER — Inpatient Hospital Stay (HOSPITAL_COMMUNITY)
Admission: EM | Admit: 2016-04-04 | Discharge: 2016-04-07 | DRG: 872 | Disposition: A | Discharge status: Home Health | Payer: Medicare Other | Attending: Internal Medicine | Admitting: Internal Medicine

## 2016-04-04 DIAGNOSIS — E1122 Type 2 diabetes mellitus with diabetic chronic kidney disease: Secondary | ICD-10-CM | POA: Diagnosis present

## 2016-04-04 DIAGNOSIS — F0390 Unspecified dementia without behavioral disturbance: Secondary | ICD-10-CM | POA: Diagnosis present

## 2016-04-04 DIAGNOSIS — N183 Chronic kidney disease, stage 3 unspecified: Secondary | ICD-10-CM | POA: Diagnosis present

## 2016-04-04 DIAGNOSIS — N400 Enlarged prostate without lower urinary tract symptoms: Secondary | ICD-10-CM | POA: Diagnosis present

## 2016-04-04 DIAGNOSIS — A419 Sepsis, unspecified organism: Secondary | ICD-10-CM | POA: Diagnosis not present

## 2016-04-04 DIAGNOSIS — Z880 Allergy status to penicillin: Secondary | ICD-10-CM

## 2016-04-04 DIAGNOSIS — D696 Thrombocytopenia, unspecified: Secondary | ICD-10-CM | POA: Diagnosis present

## 2016-04-04 DIAGNOSIS — R531 Weakness: Secondary | ICD-10-CM

## 2016-04-04 DIAGNOSIS — E1142 Type 2 diabetes mellitus with diabetic polyneuropathy: Secondary | ICD-10-CM | POA: Diagnosis present

## 2016-04-04 DIAGNOSIS — Z950 Presence of cardiac pacemaker: Secondary | ICD-10-CM | POA: Diagnosis present

## 2016-04-04 DIAGNOSIS — N390 Urinary tract infection, site not specified: Secondary | ICD-10-CM | POA: Diagnosis present

## 2016-04-04 DIAGNOSIS — I5022 Chronic systolic (congestive) heart failure: Secondary | ICD-10-CM | POA: Diagnosis present

## 2016-04-04 DIAGNOSIS — Z87891 Personal history of nicotine dependence: Secondary | ICD-10-CM

## 2016-04-04 DIAGNOSIS — Z923 Personal history of irradiation: Secondary | ICD-10-CM

## 2016-04-04 DIAGNOSIS — R509 Fever, unspecified: Secondary | ICD-10-CM | POA: Diagnosis not present

## 2016-04-04 DIAGNOSIS — N184 Chronic kidney disease, stage 4 (severe): Secondary | ICD-10-CM | POA: Diagnosis present

## 2016-04-04 DIAGNOSIS — Z8546 Personal history of malignant neoplasm of prostate: Secondary | ICD-10-CM

## 2016-04-04 DIAGNOSIS — Z885 Allergy status to narcotic agent status: Secondary | ICD-10-CM

## 2016-04-04 DIAGNOSIS — D6959 Other secondary thrombocytopenia: Secondary | ICD-10-CM | POA: Diagnosis present

## 2016-04-04 DIAGNOSIS — R7989 Other specified abnormal findings of blood chemistry: Secondary | ICD-10-CM | POA: Diagnosis present

## 2016-04-04 DIAGNOSIS — Z881 Allergy status to other antibiotic agents status: Secondary | ICD-10-CM

## 2016-04-04 DIAGNOSIS — D469 Myelodysplastic syndrome, unspecified: Secondary | ICD-10-CM | POA: Diagnosis present

## 2016-04-04 DIAGNOSIS — I130 Hypertensive heart and chronic kidney disease with heart failure and stage 1 through stage 4 chronic kidney disease, or unspecified chronic kidney disease: Secondary | ICD-10-CM | POA: Diagnosis present

## 2016-04-04 DIAGNOSIS — Z882 Allergy status to sulfonamides status: Secondary | ICD-10-CM

## 2016-04-04 DIAGNOSIS — E039 Hypothyroidism, unspecified: Secondary | ICD-10-CM | POA: Diagnosis present

## 2016-04-04 DIAGNOSIS — K7460 Unspecified cirrhosis of liver: Secondary | ICD-10-CM | POA: Diagnosis present

## 2016-04-04 DIAGNOSIS — K644 Residual hemorrhoidal skin tags: Secondary | ICD-10-CM | POA: Diagnosis present

## 2016-04-04 DIAGNOSIS — N189 Chronic kidney disease, unspecified: Secondary | ICD-10-CM | POA: Diagnosis present

## 2016-04-04 DIAGNOSIS — E1151 Type 2 diabetes mellitus with diabetic peripheral angiopathy without gangrene: Secondary | ICD-10-CM | POA: Diagnosis present

## 2016-04-04 DIAGNOSIS — R161 Splenomegaly, not elsewhere classified: Secondary | ICD-10-CM | POA: Diagnosis present

## 2016-04-04 DIAGNOSIS — I442 Atrioventricular block, complete: Secondary | ICD-10-CM | POA: Diagnosis present

## 2016-04-04 DIAGNOSIS — D631 Anemia in chronic kidney disease: Secondary | ICD-10-CM | POA: Diagnosis present

## 2016-04-04 DIAGNOSIS — Z66 Do not resuscitate: Secondary | ICD-10-CM | POA: Diagnosis present

## 2016-04-04 HISTORY — DX: Chronic kidney disease, unspecified: N18.9

## 2016-04-04 HISTORY — DX: Presence of cardiac pacemaker: Z95.0

## 2016-04-04 HISTORY — DX: Heart failure, unspecified: I50.9

## 2016-04-04 LAB — CBC WITH DIFFERENTIAL/PLATELET
Basophils Absolute: 0 10*3/uL (ref 0.0–0.1)
Basophils Relative: 0 %
EOS ABS: 0 10*3/uL (ref 0.0–0.7)
EOS PCT: 0 %
HEMATOCRIT: 28.8 % — AB (ref 39.0–52.0)
Hemoglobin: 9.1 g/dL — ABNORMAL LOW (ref 13.0–17.0)
LYMPHS ABS: 0.7 10*3/uL (ref 0.7–4.0)
Lymphocytes Relative: 4 %
MCH: 29.9 pg (ref 26.0–34.0)
MCHC: 31.6 g/dL (ref 30.0–36.0)
MCV: 94.7 fL (ref 78.0–100.0)
MONO ABS: 0.7 10*3/uL (ref 0.1–1.0)
Monocytes Relative: 4 %
Neutro Abs: 15.1 10*3/uL — ABNORMAL HIGH (ref 1.7–7.7)
Neutrophils Relative %: 92 %
Platelets: 58 10*3/uL — ABNORMAL LOW (ref 150–400)
RBC: 3.04 MIL/uL — AB (ref 4.22–5.81)
RDW: 22.1 % — AB (ref 11.5–15.5)
WBC Morphology: INCREASED
WBC: 16.5 10*3/uL — AB (ref 4.0–10.5)

## 2016-04-04 LAB — URINALYSIS, ROUTINE W REFLEX MICROSCOPIC
Bilirubin Urine: NEGATIVE
Glucose, UA: NEGATIVE mg/dL
Ketones, ur: NEGATIVE mg/dL
Nitrite: NEGATIVE
PROTEIN: 30 mg/dL — AB
Specific Gravity, Urine: 1.02 (ref 1.005–1.030)
pH: 7 (ref 5.0–8.0)

## 2016-04-04 LAB — URINE MICROSCOPIC-ADD ON

## 2016-04-04 LAB — COMPREHENSIVE METABOLIC PANEL
ALBUMIN: 3 g/dL — AB (ref 3.5–5.0)
ALT: 35 U/L (ref 17–63)
ANION GAP: 9 (ref 5–15)
AST: 44 U/L — ABNORMAL HIGH (ref 15–41)
Alkaline Phosphatase: 195 U/L — ABNORMAL HIGH (ref 38–126)
BILIRUBIN TOTAL: 1.7 mg/dL — AB (ref 0.3–1.2)
BUN: 32 mg/dL — ABNORMAL HIGH (ref 6–20)
CO2: 22 mmol/L (ref 22–32)
Calcium: 8.4 mg/dL — ABNORMAL LOW (ref 8.9–10.3)
Chloride: 107 mmol/L (ref 101–111)
Creatinine, Ser: 2.2 mg/dL — ABNORMAL HIGH (ref 0.61–1.24)
GFR calc Af Amer: 28 mL/min — ABNORMAL LOW (ref >60)
GFR, EST NON AFRICAN AMERICAN: 24 mL/min — AB (ref >60)
Glucose, Bld: 147 mg/dL — ABNORMAL HIGH (ref 65–99)
POTASSIUM: 4.3 mmol/L (ref 3.5–5.1)
Sodium: 138 mmol/L (ref 135–145)
TOTAL PROTEIN: 6.6 g/dL (ref 6.5–8.1)

## 2016-04-04 LAB — I-STAT CG4 LACTIC ACID, ED: LACTIC ACID, VENOUS: 1.26 mmol/L (ref 0.5–1.9)

## 2016-04-04 MED ORDER — LEVOFLOXACIN IN D5W 500 MG/100ML IV SOLN
500.0000 mg | Freq: Once | INTRAVENOUS | Status: AC
  Administered 2016-04-04: 500 mg via INTRAVENOUS
  Filled 2016-04-04: qty 100

## 2016-04-04 NOTE — ED Triage Notes (Signed)
Pt arrived via [facility] c/o urinary frequency, burning and fever.  Temp 101.9F.  EMS reports family states altered from baseline.

## 2016-04-04 NOTE — ED Notes (Signed)
IN/OUT at bedside.

## 2016-04-04 NOTE — ED Provider Notes (Signed)
[facility] DEPT Provider Note   CSN: [id number] Arrival date & time: 04/04/16  1958     History   Chief Complaint Chief Complaint  Patient presents with  . Fever  . Urinary Frequency    HPI Anthony Hutchinson is a 82 y.o. male.  HPI Patient is a poor historianAnd confused. Level V caveat applies. Comes from home by EMS. Family notes patient had fever while at home. Has had increased urinary frequency, generalized weakness and increased confusion. No known trauma. Currently he denies any pain. Past Medical History:  Diagnosis Date  . Anemia    iron deficiency anemia  . Cancer ([place])   . Cataracts, bilateral   . Complete heart block ([place])   . Diabetes mellitus without complication ([place])   . Hypertension   . Hypothyroidism    nodule  . Inguinal hernia   . LBBB (left bundle branch block)   . MDS (myelodysplastic syndrome) ([place]) 01/05/2014  . Peripheral vascular disease ([place])    Peripheral neuropathy    Patient Active Problem List   Diagnosis Date Noted  . UTI (lower urinary tract infection) 04/05/2016  . Port catheter in place 03/09/2016  . Bacterial skin infection of leg 01/27/2016  . Hypothyroidism 01/14/2016  . External hemorrhoids   . Quality of life palliative care encounter 11/18/2015  . Stage III chronic kidney disease 03/13/2015  . Rectal bleeding 03/11/2015  . AAA (abdominal aortic aneurysm) without rupture ([place]) 11/20/2014  . Pacemaker 11/17/2014  . Thrombocytopenia ([place]) 11/13/2014  . Leaking abdominal aortic aneurysm ([place]) 11/13/2014  . Splenomegaly 11/13/2014  . MDS (myelodysplastic syndrome) ([place]) 01/05/2014  . Atrioventricular block, complete ([place]) 11/03/2013  . Chronic systolic heart failure ([place]) 10/31/2013  . Bradycardia 10/31/2013  . Complete heart block ([place]) 10/31/2013  . Cholelithiasis 05/14/2012  . Anemia associated with chronic renal failure 05/25/2011  . Anemia in neoplastic disease 05/25/2011    Past Surgical History:  Procedure  Laterality Date  . ABDOMINAL AORTIC ANEURYSM REPAIR    . ACROMIONECTOMY  1998   rotator cuff repair  . FLEXIBLE SIGMOIDOSCOPY N/A 12/13/2012   Procedure: FLEXIBLE SIGMOIDOSCOPY;  Surgeon: Beryle Beams, MD;  Location: [facility] ENDOSCOPY;  Service: Endoscopy;  Laterality: N/A;  . FLEXIBLE SIGMOIDOSCOPY N/A 03/12/2015   Procedure: FLEXIBLE SIGMOIDOSCOPY;  Surgeon: Carol Ada, MD;  Location: [facility] ENDOSCOPY;  Service: Endoscopy;  Laterality: N/A;  will need APC   . HERNIA REPAIR  1979   bilateral inguinal hernia  . HIATAL HERNIA REPAIR    . HOT HEMOSTASIS N/A 12/13/2012   Procedure: HOT HEMOSTASIS (ARGON PLASMA COAGULATION/BICAP);  Surgeon: Beryle Beams, MD;  Location: [facility];  Service: Endoscopy;  Laterality: N/A;  . HOT HEMOSTASIS N/A 03/12/2015   Procedure: HOT HEMOSTASIS (ARGON PLASMA COAGULATION/BICAP);  Surgeon: Carol Ada, MD;  Location: [facility] ENDOS[facility];  Service: Endoscopy;  Laterality: N/A;  . IR GENERIC HISTORICAL  02/21/2016   IR US GUIDE VASC ACCESS RIGHT 02/21/2016 Arne Cleveland, MD [facility]  . IR GENERIC HISTORICAL  02/21/2016   IR FLUORO GUIDE CV LINE RIGHT 02/21/2016 Arne Cleveland, MD [facility]  . PACEMAKER INSERTION  11-03-13   STJ dual chamber pacemaker implanted by Dr Lovena Le for CHB  . PERMANENT PACEMAKER INSERTION N/A 11/03/2013   Procedure: PERMANENT PACEMAKER INSERTION;  Surgeon: Evans Lance, MD;  Location: [facility] CATH LAB;  Service: Cardiovascular;  Laterality: N/A;  . PROSTATE SURGERY    . THYROID LOBECTOMY  6/85   right  . TONSILLECTOMY    .  TONSILLECTOMY         Home Medications    Prior to Admission medications   Medication Sig Start Date End Date Taking? Authorizing Provider  acetaminophen (TYLENOL) 500 MG tablet Take 500 mg by mouth every 6 (six) hours as needed (pain).   Yes Historical Provider, MD  alfuzosin (UROXATRAL) 10 MG 24 hr tablet Take 10 mg by mouth every evening.    Yes Historical Provider, MD  aminocaproic acid (AMICAR) 500 MG tablet Take 1  tablet (500 mg total) by mouth every 8 (eight) hours as needed for bleeding. 01/27/16  Yes Heath Lark, MD  calcium carbonate (TUMS - DOSED IN MG ELEMENTAL CALCIUM) 500 MG chewable tablet Chew 1 tablet by mouth daily as needed for indigestion or heartburn.   Yes Historical Provider, MD  furosemide (LASIX) 20 MG tablet Take 1 tablet (20 mg total) by mouth daily. 12/22/15  Yes Evans Lance, MD  lidocaine-prilocaine (EMLA) cream Apply 1 application topically as needed. Apply to port 1 hour prior to access. 02/22/16  Yes Heath Lark, MD  loperamide (IMODIUM) 2 MG capsule Take 2 mg by mouth daily as needed for diarrhea or loose stools.   Yes Historical Provider, MD  Multiple Vitamin (MULTIVITAMIN WITH MINERALS) TABS Take 1 tablet by mouth every morning.   Yes Historical Provider, MD  SYNTHROID 125 MCG tablet Take 125 mcg by mouth daily before breakfast.  09/15/14  Yes Historical Provider, MD  TOPROL XL 25 MG 24 hr tablet Take 25 mg by mouth daily. 12/24/13  Yes Historical Provider, MD  cephALEXin (KEFLEX) 500 MG capsule Take 1 capsule (500 mg total) by mouth 3 (three) times daily. Patient not taking: Reported on 04/04/2016 01/27/16   Heath Lark, MD  docusate sodium (COLACE) 100 MG capsule Take 1 capsule (100 mg total) by mouth 2 (two) times daily. Patient not taking: Reported on 04/04/2016 01/15/16   Thurnell Lose, MD  furosemide (LASIX) 20 MG tablet Take 1 tablet (20 mg total) by mouth daily. Patient not taking: Reported on 04/04/2016 02/02/16   Evans Lance, MD    Family History Family History  Problem Relation Age of Onset  . Heart disease Mother   . Heart disease Father   . Heart disease Son   . Hyperlipidemia Son     Social History Social History  Substance Use Topics  . Smoking status: Former Smoker    Types: Pipe    Quit date: 11/27/1963  . Smokeless tobacco: Never Used  . Alcohol use No     Allergies   Cephalosporins; Codeine sulfate; and Penicillins   Review of Systems Review of  Systems  Unable to perform ROS: Dementia  Constitutional: Positive for fever.  All other systems reviewed and are negative.    Physical Exam Updated Vital Signs BP (!) 127/48   Pulse 77   Temp 102.3 F (39.1 C) (Rectal)   Resp (!) 28   SpO2 98%   Physical Exam  Constitutional: He appears well-developed and well-nourished. No distress.  HENT:  Head: Normocephalic and atraumatic.  Mouth/Throat: Oropharynx is clear and moist.  Eyes: EOM are normal. Pupils are equal, round, and reactive to light.  Neck: Normal range of motion. Neck supple.  Cardiovascular: Normal rate.   Irregular rhythm  Pulmonary/Chest: Effort normal. He has rales.  Crackles in bilateral bases  Abdominal: Soft. Bowel sounds are normal. There is no tenderness. There is no rebound and no guarding.  Musculoskeletal: Normal range of motion. He exhibits edema.  He exhibits no tenderness.  2+ bilateral lower extremity pitting edema.  Neurological: He is alert.  Confused but following commands. Moving all extremities without deficit. Sensation intact.  Skin: Skin is warm and dry. Capillary refill takes less than 2 seconds. No rash noted. No erythema. No pallor.  Psychiatric: He has a normal mood and affect. His behavior is normal.  Nursing note and vitals reviewed.    ED Treatments / Results  Labs (all labs ordered are listed, but only abnormal results are displayed) Labs Reviewed  CBC WITH DIFFERENTIAL/PLATELET - Abnormal; Notable for the following:       Result Value   WBC 16.5 (*)    RBC 3.04 (*)    Hemoglobin 9.1 (*)    HCT 28.8 (*)    RDW 22.1 (*)    Platelets 58 (*)    Neutro Abs 15.1 (*)    All other components within normal limits  COMPREHENSIVE METABOLIC PANEL - Abnormal; Notable for the following:    Glucose, Bld 147 (*)    BUN 32 (*)    Creatinine, Ser 2.20 (*)    Calcium 8.4 (*)    Albumin 3.0 (*)    AST 44 (*)    Alkaline Phosphatase 195 (*)    Total Bilirubin 1.7 (*)    GFR calc non Af  Amer 24 (*)    GFR calc Af Amer 28 (*)    All other components within normal limits  URINALYSIS, ROUTINE W REFLEX MICROSCOPIC (NOT AT [facility]) - Abnormal; Notable for the following:    APPearance CLOUDY (*)    Hgb urine dipstick SMALL (*)    Protein, ur 30 (*)    Leukocytes, UA MODERATE (*)    All other components within normal limits  URINE MICROSCOPIC-ADD ON - Abnormal; Notable for the following:    Squamous Epithelial / LPF 6-30 (*)    Bacteria, UA MANY (*)    All other components within normal limits  I-STAT CG4 LACTIC ACID, ED    EKG  EKG Interpretation  Date/Time:  Tuesday April 04 2016 20:17:57 EDT Ventricular Rate:  93 PR Interval:    QRS Duration: 165 QT Interval:  413 QTC Calculation: 514 R Axis:   -66 Text Interpretation:  Sinus rhythm LVH with IVCD, LAD and secondary repol abnrm Anterolateral infarct, old Prolonged QT interval Confirmed by Lita Mains  MD, Jostin Rue ([id number]) on 04/04/2016 8:31:41 PM       Radiology No results found.  Procedures Procedures (including critical care time)  Medications Ordered in ED Medications  levofloxacin (LEVAQUIN) IVPB 500 mg (500 mg Intravenous New Bag/Given 04/04/16 2357)     Initial Impression / Assessment and Plan / ED Course  I have reviewed the triage vital signs and the nursing notes.  Pertinent labs & imaging results that were available during my care of the patient were reviewed by me and considered in my medical decision making (see chart for details).  Clinical Course  Discussed with hospitalist and we'll admit to MedSurg bed. IV antibiotics initiated in the emergency department.    Final Clinical Impressions(s) / ED Diagnoses   Final diagnoses:  UTI (lower urinary tract infection)  Generalized weakness    New Prescriptions New Prescriptions   No medications on file     Julianne Rice, MD 04/05/16 0009

## 2016-04-05 ENCOUNTER — Telehealth: Payer: Self-pay | Admitting: *Deleted

## 2016-04-05 ENCOUNTER — Encounter (HOSPITAL_COMMUNITY): Payer: Self-pay | Admitting: Family Medicine

## 2016-04-05 DIAGNOSIS — K644 Residual hemorrhoidal skin tags: Secondary | ICD-10-CM | POA: Diagnosis present

## 2016-04-05 DIAGNOSIS — N390 Urinary tract infection, site not specified: Secondary | ICD-10-CM | POA: Diagnosis present

## 2016-04-05 DIAGNOSIS — F0390 Unspecified dementia without behavioral disturbance: Secondary | ICD-10-CM | POA: Diagnosis present

## 2016-04-05 DIAGNOSIS — D469 Myelodysplastic syndrome, unspecified: Secondary | ICD-10-CM

## 2016-04-05 DIAGNOSIS — N184 Chronic kidney disease, stage 4 (severe): Secondary | ICD-10-CM

## 2016-04-05 DIAGNOSIS — Z881 Allergy status to other antibiotic agents status: Secondary | ICD-10-CM | POA: Diagnosis not present

## 2016-04-05 DIAGNOSIS — D631 Anemia in chronic kidney disease: Secondary | ICD-10-CM

## 2016-04-05 DIAGNOSIS — E1151 Type 2 diabetes mellitus with diabetic peripheral angiopathy without gangrene: Secondary | ICD-10-CM | POA: Diagnosis present

## 2016-04-05 DIAGNOSIS — R509 Fever, unspecified: Secondary | ICD-10-CM | POA: Diagnosis present

## 2016-04-05 DIAGNOSIS — I5022 Chronic systolic (congestive) heart failure: Secondary | ICD-10-CM | POA: Diagnosis present

## 2016-04-05 DIAGNOSIS — E1142 Type 2 diabetes mellitus with diabetic polyneuropathy: Secondary | ICD-10-CM | POA: Diagnosis present

## 2016-04-05 DIAGNOSIS — Z950 Presence of cardiac pacemaker: Secondary | ICD-10-CM | POA: Diagnosis not present

## 2016-04-05 DIAGNOSIS — K7460 Unspecified cirrhosis of liver: Secondary | ICD-10-CM | POA: Diagnosis present

## 2016-04-05 DIAGNOSIS — E039 Hypothyroidism, unspecified: Secondary | ICD-10-CM | POA: Diagnosis present

## 2016-04-05 DIAGNOSIS — I442 Atrioventricular block, complete: Secondary | ICD-10-CM | POA: Diagnosis not present

## 2016-04-05 DIAGNOSIS — A419 Sepsis, unspecified organism: Secondary | ICD-10-CM | POA: Diagnosis present

## 2016-04-05 DIAGNOSIS — I130 Hypertensive heart and chronic kidney disease with heart failure and stage 1 through stage 4 chronic kidney disease, or unspecified chronic kidney disease: Secondary | ICD-10-CM | POA: Diagnosis present

## 2016-04-05 DIAGNOSIS — R7989 Other specified abnormal findings of blood chemistry: Secondary | ICD-10-CM | POA: Diagnosis present

## 2016-04-05 DIAGNOSIS — Z8546 Personal history of malignant neoplasm of prostate: Secondary | ICD-10-CM | POA: Diagnosis not present

## 2016-04-05 DIAGNOSIS — Z87891 Personal history of nicotine dependence: Secondary | ICD-10-CM | POA: Diagnosis not present

## 2016-04-05 DIAGNOSIS — D6959 Other secondary thrombocytopenia: Secondary | ICD-10-CM | POA: Diagnosis present

## 2016-04-05 DIAGNOSIS — E1122 Type 2 diabetes mellitus with diabetic chronic kidney disease: Secondary | ICD-10-CM | POA: Diagnosis present

## 2016-04-05 DIAGNOSIS — R161 Splenomegaly, not elsewhere classified: Secondary | ICD-10-CM | POA: Diagnosis present

## 2016-04-05 DIAGNOSIS — D696 Thrombocytopenia, unspecified: Secondary | ICD-10-CM

## 2016-04-05 DIAGNOSIS — N400 Enlarged prostate without lower urinary tract symptoms: Secondary | ICD-10-CM | POA: Diagnosis present

## 2016-04-05 DIAGNOSIS — Z923 Personal history of irradiation: Secondary | ICD-10-CM | POA: Diagnosis not present

## 2016-04-05 DIAGNOSIS — Z66 Do not resuscitate: Secondary | ICD-10-CM | POA: Diagnosis present

## 2016-04-05 LAB — CUP PACEART REMOTE DEVICE CHECK
Brady Statistic AP VP Percent: 41 %
Brady Statistic AP VS Percent: 1 %
Brady Statistic AS VP Percent: 59 %
Brady Statistic AS VS Percent: 1 %
Implantable Lead Implant Date: 20150427
Implantable Lead Location: 753859
Lead Channel Impedance Value: 380 Ohm
Lead Channel Pacing Threshold Amplitude: 1 V
Lead Channel Pacing Threshold Pulse Width: 0.4 ms
Lead Channel Sensing Intrinsic Amplitude: 9.4 mV
Lead Channel Setting Pacing Amplitude: 1.25 V
Lead Channel Setting Pacing Amplitude: 1.5 V
Lead Channel Setting Pacing Pulse Width: 0.4 ms
MDC IDC LEAD IMPLANT DT: 20150427
MDC IDC LEAD LOCATION: 753860
MDC IDC MSMT BATTERY REMAINING LONGEVITY: 121 mo
MDC IDC MSMT BATTERY REMAINING PERCENTAGE: 95.5 %
MDC IDC MSMT BATTERY VOLTAGE: 2.99 V
MDC IDC MSMT LEADCHNL RA IMPEDANCE VALUE: 380 Ohm
MDC IDC MSMT LEADCHNL RA PACING THRESHOLD AMPLITUDE: 0.5 V
MDC IDC MSMT LEADCHNL RA PACING THRESHOLD PULSEWIDTH: 0.4 ms
MDC IDC MSMT LEADCHNL RA SENSING INTR AMPL: 3 mV
MDC IDC SESS DTM: 20170914195841
MDC IDC SET LEADCHNL RV SENSING SENSITIVITY: 4 mV
MDC IDC STAT BRADY RA PERCENT PACED: 39 %
MDC IDC STAT BRADY RV PERCENT PACED: 99 %
Pulse Gen Model: 2240
Pulse Gen Serial Number: 3013631

## 2016-04-05 LAB — COMPREHENSIVE METABOLIC PANEL
ALBUMIN: 2.4 g/dL — AB (ref 3.5–5.0)
ALK PHOS: 150 U/L — AB (ref 38–126)
ALT: 28 U/L (ref 17–63)
ANION GAP: 11 (ref 5–15)
AST: 44 U/L — ABNORMAL HIGH (ref 15–41)
BILIRUBIN TOTAL: 1.5 mg/dL — AB (ref 0.3–1.2)
BUN: 33 mg/dL — ABNORMAL HIGH (ref 6–20)
CALCIUM: 8 mg/dL — AB (ref 8.9–10.3)
CO2: 18 mmol/L — ABNORMAL LOW (ref 22–32)
Chloride: 107 mmol/L (ref 101–111)
Creatinine, Ser: 2.09 mg/dL — ABNORMAL HIGH (ref 0.61–1.24)
GFR, EST AFRICAN AMERICAN: 30 mL/min — AB (ref >60)
GFR, EST NON AFRICAN AMERICAN: 26 mL/min — AB (ref >60)
GLUCOSE: 134 mg/dL — AB (ref 65–99)
Potassium: 4.5 mmol/L (ref 3.5–5.1)
Sodium: 136 mmol/L (ref 135–145)
TOTAL PROTEIN: 5.7 g/dL — AB (ref 6.5–8.1)

## 2016-04-05 LAB — CBC
HEMATOCRIT: 24.5 % — AB (ref 39.0–52.0)
HEMOGLOBIN: 7.8 g/dL — AB (ref 13.0–17.0)
MCH: 29.8 pg (ref 26.0–34.0)
MCHC: 31.8 g/dL (ref 30.0–36.0)
MCV: 93.5 fL (ref 78.0–100.0)
Platelets: 38 10*3/uL — ABNORMAL LOW (ref 150–400)
RBC: 2.62 MIL/uL — ABNORMAL LOW (ref 4.22–5.81)
RDW: 22.3 % — ABNORMAL HIGH (ref 11.5–15.5)
WBC: 11.3 10*3/uL — ABNORMAL HIGH (ref 4.0–10.5)

## 2016-04-05 MED ORDER — HYDROCORTISONE ACETATE 25 MG RE SUPP
25.0000 mg | Freq: Two times a day (BID) | RECTAL | Status: DC
  Administered 2016-04-06: 25 mg via RECTAL
  Filled 2016-04-05 (×3): qty 1

## 2016-04-05 MED ORDER — ALFUZOSIN HCL ER 10 MG PO TB24
10.0000 mg | ORAL_TABLET | Freq: Every evening | ORAL | Status: DC
  Administered 2016-04-05 – 2016-04-06 (×2): 10 mg via ORAL
  Filled 2016-04-05 (×2): qty 1

## 2016-04-05 MED ORDER — SODIUM CHLORIDE 0.9 % IV SOLN
INTRAVENOUS | Status: AC
  Administered 2016-04-05: 03:00:00 via INTRAVENOUS

## 2016-04-05 MED ORDER — METOPROLOL SUCCINATE ER 25 MG PO TB24
25.0000 mg | ORAL_TABLET | Freq: Every day | ORAL | Status: DC
  Administered 2016-04-05 – 2016-04-06 (×2): 25 mg via ORAL
  Filled 2016-04-05 (×2): qty 1

## 2016-04-05 MED ORDER — LEVOFLOXACIN IN D5W 500 MG/100ML IV SOLN
500.0000 mg | INTRAVENOUS | Status: DC
  Administered 2016-04-06: 500 mg via INTRAVENOUS
  Filled 2016-04-05: qty 100

## 2016-04-05 MED ORDER — LEVOTHYROXINE SODIUM 25 MCG PO TABS
125.0000 ug | ORAL_TABLET | Freq: Every day | ORAL | Status: DC
  Administered 2016-04-05 – 2016-04-07 (×3): 125 ug via ORAL
  Filled 2016-04-05 (×3): qty 1

## 2016-04-05 MED ORDER — AZTREONAM 1 G IJ SOLR
1.0000 g | Freq: Three times a day (TID) | INTRAMUSCULAR | Status: AC
  Administered 2016-04-05 – 2016-04-06 (×5): 1 g via INTRAVENOUS
  Filled 2016-04-05 (×5): qty 1

## 2016-04-05 MED ORDER — LOPERAMIDE HCL 2 MG PO CAPS
4.0000 mg | ORAL_CAPSULE | Freq: Once | ORAL | Status: AC
  Administered 2016-04-06: 4 mg via ORAL
  Filled 2016-04-05: qty 2

## 2016-04-05 MED ORDER — ACETAMINOPHEN 325 MG PO TABS
650.0000 mg | ORAL_TABLET | Freq: Four times a day (QID) | ORAL | Status: DC | PRN
  Filled 2016-04-05: qty 2

## 2016-04-05 MED ORDER — DEXTROSE 5 % IV SOLN
2.0000 g | Freq: Once | INTRAVENOUS | Status: AC
  Administered 2016-04-05: 2 g via INTRAVENOUS
  Filled 2016-04-05: qty 2

## 2016-04-05 MED ORDER — DARBEPOETIN ALFA 500 MCG/ML IJ SOSY
500.0000 ug | PREFILLED_SYRINGE | Freq: Once | INTRAMUSCULAR | Status: AC
  Administered 2016-04-06: 500 ug via SUBCUTANEOUS
  Filled 2016-04-05: qty 1

## 2016-04-05 MED ORDER — DARBEPOETIN ALFA 100 MCG/0.5ML IJ SOSY: 500.0000 ug | PREFILLED_SYRINGE | Freq: Once | INTRAMUSCULAR | Status: DC

## 2016-04-05 MED ORDER — ACETAMINOPHEN 650 MG RE SUPP: 650.0000 mg | Freq: Four times a day (QID) | RECTAL | Status: DC | PRN

## 2016-04-05 NOTE — H&P (Signed)
History and Physical  Patient Name: Anthony Hutchinson     [id number]    DOB: 30-Mar-1922    DOA: 04/04/2016 PCP: Merrilee Seashore, MD   Patient coming from: Home  Chief Complaint: Confusion, fever, chills  HPI: Anthony Hutchinson is a 82 y.o. male with a past medical history significant for myelodysplastic syndrome, CKD 4, CHF EF 45%, hypothyroidism, possible cirrhosis, history of prostate cancer with radiation proctitis, and CHB with pacer who presents with fever.  History is primarily collected from son at the bedside, as well as the patient.  The patient was in his usual state of health until about 2 weeks ago when he started to develop increased urinary incontinence.  Then today, after lunch the patient went to the bathroom, and called out to his son who found him with "shaking chills" and weak. He helped his father back to the chair covered with blankets, but that his father had to go back to the bathroom again, where he again had shaking chills, weakness, and this time some confusion. The confusion persisted to the afternoon (being disoriented, feeling that the curtains were "trying to get him", having difficulty directing his walker) until his family brought him to the ER.  At no point was there cough, sputum, dyspnea.  ED course: -Febrile to 102.66F, heart rate 97, respirations 20s, blood pressure and pulse oximetry normal -Na 138, K 4.3, Cr 2.2 (baseline 2-2.2), WBC 16.5 K, Hgb 9.1 -There is a significant bandemia, lactate 1.2 -Urinalysis with bacteriuria and pyuria -Chest x-ray possible infiltrate -Mild elevation in AST and total bilirubin -Cultures were obtained and he was administered Levaquin and TRH were asked to evaluate for admission for UTI sepsis   The patient has myelodysplastic syndrome, managed by Dr. Alvy Bimler (last blood transfusion and darbapoietin infusion on 9/14, getting transfused about every 2 weeks).    He had a recent rash from Keflex.  He has a history of prostate  cancer treated with radiation, leading to radiation proctitis and fecal incontinence and rectal bleeding, last in July of this year he was admitted for three days for rectal bleeding, requiring only 1 unit PRBCs.  He also has some urinary hesitancy for which he takes uroxatral.      ROS: Review of Systems  Constitutional: Positive for chills and fever.  Respiratory: Negative for cough, sputum production and shortness of breath.   Gastrointestinal: Negative for abdominal pain, blood in stool, nausea and vomiting.  Genitourinary: Positive for frequency (and foul urine) and urgency. Negative for flank pain and hematuria.  All other systems reviewed and are negative.         Past Medical History:  Diagnosis Date  . Anemia    iron deficiency anemia  . Cancer ([place])   . Cataracts, bilateral   . Complete heart block ([place])   . Diabetes mellitus without complication ([place])   . Hypertension   . Hypothyroidism    nodule  . Inguinal hernia   . LBBB (left bundle branch block)   . MDS (myelodysplastic syndrome) ([place]) 01/05/2014  . Peripheral vascular disease ([place])    Peripheral neuropathy    Past Surgical History:  Procedure Laterality Date  . ABDOMINAL AORTIC ANEURYSM REPAIR    . ACROMIONECTOMY  1998   rotator cuff repair  . FLEXIBLE SIGMOIDOSCOPY N/A 12/13/2012   Procedure: FLEXIBLE SIGMOIDOSCOPY;  Surgeon: Beryle Beams, MD;  Location: [facility] ENDOSCOPY;  Service: Endoscopy;  Laterality: N/A;  . FLEXIBLE SIGMOIDOSCOPY N/A 03/12/2015   Procedure: FLEXIBLE SIGMOIDOSCOPY;  Surgeon: Carol Ada, MD;  Location: [facility];  Service: Endoscopy;  Laterality: N/A;  will need APC   . HERNIA REPAIR  1979   bilateral inguinal hernia  . HIATAL HERNIA REPAIR    . HOT HEMOSTASIS N/A 12/13/2012   Procedure: HOT HEMOSTASIS (ARGON PLASMA COAGULATION/BICAP);  Surgeon: Beryle Beams, MD;  Location: [facility];  Service: Endoscopy;  Laterality: N/A;  . HOT HEMOSTASIS N/A 03/12/2015   Procedure: HOT  HEMOSTASIS (ARGON PLASMA COAGULATION/BICAP);  Surgeon: Carol Ada, MD;  Location: [facility];  Service: Endoscopy;  Laterality: N/A;  . IR GENERIC HISTORICAL  02/21/2016   IR US GUIDE VASC ACCESS RIGHT 02/21/2016 Arne Cleveland, MD [facility]  . IR GENERIC HISTORICAL  02/21/2016   IR FLUORO GUIDE CV LINE RIGHT 02/21/2016 Arne Cleveland, MD [facility]  . PACEMAKER INSERTION  11-03-13   STJ dual chamber pacemaker implanted by Dr Lovena Le for CHB  . PERMANENT PACEMAKER INSERTION N/A 11/03/2013   Procedure: PERMANENT PACEMAKER INSERTION;  Surgeon: Evans Lance, MD;  Location: [facility] CATH LAB;  Service: Cardiovascular;  Laterality: N/A;  . PROSTATE SURGERY    . THYROID LOBECTOMY  6/85   right  . TONSILLECTOMY    . TONSILLECTOMY      Social History: Patient lives with his son.  The patient walks with a walker.  He is from [place].  He worked for [employer] in [place].  He is a remote former smoker.  He has mild dementia or MCI at baseline.    Allergies  Allergen Reactions  . Cephalosporins Rash  . Codeine Sulfate Itching    Itching on fingers   . Penicillins Other (See Comments)    Itching on fingers Has patient had a PCN reaction causing immediate rash, facial/tongue/throat swelling, SOB or lightheadedness with hypotension: Has patient had a PCN reaction causing severe rash involving mucus membranes or skin necrosis: Has patient had a PCN reaction that required hospitalization Has patient had a PCN reaction occurring within the last 10 years:  If all of the above answers are "NO", then may proceed with Cephalosporin use.     Family history: family history includes Congestive Heart Failure in his father; Emphysema in his mother; Heart disease in his mother and son; Hyperlipidemia in his son.  Prior to Admission medications   Medication Sig Start Date End Date Taking? Authorizing Provider  acetaminophen (TYLENOL) 500 MG tablet Take 500 mg by mouth every 6 (six)  hours as needed (pain).   Yes Historical Provider, MD  alfuzosin (UROXATRAL) 10 MG 24 hr tablet Take 10 mg by mouth every evening.    Yes Historical Provider, MD  aminocaproic acid (AMICAR) 500 MG tablet Take 1 tablet (500 mg total) by mouth every 8 (eight) hours as needed for bleeding. 01/27/16  Yes Heath Lark, MD  calcium carbonate (TUMS - DOSED IN MG ELEMENTAL CALCIUM) 500 MG chewable tablet Chew 1 tablet by mouth daily as needed for indigestion or heartburn.   Yes Historical Provider, MD  furosemide (LASIX) 20 MG tablet Take 1 tablet (20 mg total) by mouth daily. 12/22/15  Yes Evans Lance, MD  lidocaine-prilocaine (EMLA) cream Apply 1 application topically as needed. Apply to port 1 hour prior to access. 02/22/16  Yes Heath Lark, MD  loperamide (IMODIUM) 2 MG capsule Take 2 mg by mouth daily as needed for diarrhea or loose stools.   Yes Historical Provider, MD  Multiple Vitamin (MULTIVITAMIN WITH MINERALS) TABS Take 1 tablet by mouth every morning.  Yes Historical Provider, MD  SYNTHROID 125 MCG tablet Take 125 mcg by mouth daily before breakfast.  09/15/14  Yes Historical Provider, MD  TOPROL XL 25 MG 24 hr tablet Take 25 mg by mouth daily. 12/24/13  Yes Historical Provider, MD  cephALEXin (KEFLEX) 500 MG capsule Take 1 capsule (500 mg total) by mouth 3 (three) times daily. Patient not taking: Reported on 04/04/2016 01/27/16   Heath Lark, MD  docusate sodium (COLACE) 100 MG capsule Take 1 capsule (100 mg total) by mouth 2 (two) times daily. Patient not taking: Reported on 04/04/2016 01/15/16   Thurnell Lose, MD  furosemide (LASIX) 20 MG tablet Take 1 tablet (20 mg total) by mouth daily. Patient not taking: Reported on 04/04/2016 02/02/16   Evans Lance, MD       Physical Exam: BP (!) 122/51   Pulse 81   Temp 102.3 F (39.1 C) (Rectal)   Resp 26   SpO2 97%  General appearance: Thin elderly adult male, alert and in no acute distress.   Eyes: Anicteric, conjunctiva pink, lids and lashes  normal. PERRL.    ENT: No nasal deformity, discharge, epistaxis.  Hearing normal. OP tacky dry without lesions.   Neck: No neck masses.  Trachea midline.  No thyromegaly/tenderness. Lymph: No cervical or supraclavicular lymphadenopathy. Skin: Warm and dry.  No jaundice.  Some scattered bruising. Cardiac: RRR, nl S1-S2, no murmurs appreciated.  Capillary refill is brisk.  No JVD.  No LE edema.  Radial and DP pulses 2+ and symmetric. Respiratory: Normal respiratory rate and rhythm.  No rales.  Some rhonchi and scattered wheezes with expiration. Abdomen: Abdomen soft.  No TTP or gaurding. No ascites, distension, hepatosplenomegaly.   MSK: No deformities or effusions.  No cyanosis or clubbing. Neuro: Cranial nerves normal.  Sensation intact to light touch. Speech is fluent.  Muscle strength symmetric but globally diminished.    Psych: Sensorium intact and responding to questions, attention normal.  Behavior appropriate.  Affect pleasant.     Labs on Admission:  I have personally reviewed following labs and imaging studies: CBC:  Recent Labs Lab 04/04/16 2242  WBC 16.5*  NEUTROABS 15.1*  HGB 9.1*  HCT 28.8*  MCV 94.7  PLT 58*   Basic Metabolic Panel:  Recent Labs Lab 04/04/16 2242  NA 138  K 4.3  CL 107  CO2 22  GLUCOSE 147*  BUN 32*  CREATININE 2.20*  CALCIUM 8.4*   GFR: CrCl cannot be calculated (Unknown ideal weight.).  Liver Function Tests:  Recent Labs Lab 04/04/16 2242  AST 44*  ALT 35  ALKPHOS 195*  BILITOT 1.7*  PROT 6.6  ALBUMIN 3.0*   No results for input(s): LIPASE, AMYLASE in the last 168 hours. No results for input(s): AMMONIA in the last 168 hours. Coagulation Profile: No results for input(s): INR, PROTIME in the last 168 hours. Cardiac Enzymes: No results for input(s): CKTOTAL, CKMB, CKMBINDEX, TROPONINI in the last 168 hours. BNP (last 3 results) No results for input(s): PROBNP in the last 8760 hours. HbA1C: No results for input(s): HGBA1C  in the last 72 hours. CBG: No results for input(s): GLUCAP in the last 168 hours. Lipid Profile: No results for input(s): CHOL, HDL, LDLCALC, TRIG, CHOLHDL, LDLDIRECT in the last 72 hours. Thyroid Function Tests: No results for input(s): TSH, T4TOTAL, FREET4, T3FREE, THYROIDAB in the last 72 hours. Anemia Panel: No results for input(s): VITAMINB12, FOLATE, FERRITIN, TIBC, IRON, RETICCTPCT in the last 72 hours. Sepsis Labs: Lactic acid  1.2 Invalid input(s): PROCALCITONIN, LACTICIDVEN No results found for this or any previous visit (from the past 240 hour(s)).       Radiological Exams on Admission: Personally reviewed: Dg Chest 2 View  Result Date: 04/05/2016 CLINICAL DATA:  Fever, hypertension, diabetic EXAM: CHEST  2 VIEW COMPARISON:  02/18/2015 FINDINGS: AP semi-erect chest. Left-sided pacing device with leads overlying the right atrium and right ventricle. Additional right-sided central venous port with tip overlying the SVC. There are low lung volumes. There is interval enlargement of the heart size, now mildly enlarged. Central vascular congestion is present. Diffuse interstitial opacities are present, likely related to edema. There are tiny bilateral effusions. There is ill-defined opacity within the left greater than right lung bases, possible infiltrate. There is atherosclerosis of the aorta. There is no pneumothorax. Surgical clips at the base of the right neck. IMPRESSION: 1. Low lung volumes. Interval enlargement of the cardiomediastinal silhouette with central vascular congestion present. Diffuse interstitial opacities likely reflect edema upon chronic change. 2. Tiny bilateral effusions. Ill-defined bibasilar opacities, left greater than right could relate to atelectasis or infiltrates. Linear scarring or atelectasis in the right mid lung zone. Electronically Signed   By: Donavan Foil M.D.   On: 04/05/2016 00:09    EKG: Independently reviewed. Rate 93, old IVCD (pacing spikes not  visualized), unchanged from previous.   Echocardiogram 2015: EF 45-50% LVH Moderate TR PA pressure estimated 60 mmHg    Assessment/Plan 1. UTI with early sepsis:  Suspected source urine. Organism unknown. Patient meets criteria given tachycardia, tachypnea, fever, leukocytosis, but without evidence of organ dysfunction other than confusion.  Lactate normal.  Antibiotics delivered in the ED.    -Sepsis bundle utilized:  -Blood and urine cultures drawn  -Start targeted antibiotics with levaquin and aztreonam, based on suspected source of infection    -Repeat renal function and complete blood count in AM  -Code SEPSIS called to E-link    2. MDS with anemia and thrombocytopenia:  Platelets >50K and hemoglobin stable -Daily CBC  3. CKD III-IV:  Stable at baseline.  4. Chronic systolic CHF:  Appears euvolemic to hypovolemic.  EF 45% -Hold furosemide for now -Continue metoprolol -I/Os and daily weights  5. Hypothyroidism:  -Continue levothyroxine  6. CHB with pacer:  Stable  7. Urinary hesitancy: -Continue Uroxatral  8. Abnormal LFTs: History of cirrhosis by CT previously, and known splenomegaly.  Previously LFTs slightly abnormal. -Repeat LFT to confirm not worsening     DVT prophylaxis: SCDs  Code Status: DO NOT RESUSCITATE  Family Communication: Son and daughter at bedside, overnight plan discussed, CODE STATUS confirmed. Disposition Plan: Anticipate fluids gently overnight, antibiotics and follow culture data.   Consults called: None Admission status: INAPTIENT, med surg       Medical decision making: Patient seen at 12:29 AM on 04/05/2016.  The patient was discussed with Dr. Lita Mains.  What exists of the patient's chart was reviewed in depth and summarized above.  Clinical condition: stable from hemodynamic standpoint.        Edwin Dada [facility] Pager [phone]      At the time of admission, it appears that the appropriate  admission status for this patient is INPATIENT. This is judged to be reasonable and necessary in order to provide the required intensity of service to ensure the patient's safety given the presenting symptoms, physical exam findings, and initial radiographic and laboratory data in the context of their chronic comorbidities.  Together, these circumstances are felt to  place her/him at high risk for further clinical deterioration threatening life, limb, or organ. The following factors support the admission status of inpatient:   A. The patient's presenting symptoms include confusion, fever B. The worrisome physical exam findings include tachycardia, tachpypnea, fever C. The initial radiographic and laboratory data are worrisome because of anemia, thrombocytopenia, leukocytosis, elevated liver function tests, chronic kidney disease, pyuria D. The chronic co-morbidities include complete heart block with pacemaker, myelodysplastic syndrome with anemia and thrombocytoenia, cirrhosis, chronic kidney disease, hypothyroidism, congestive heart failure with reduced EF E. Patient requires inpatient status due to high intensity of service, high risk for further deterioration and high frequency of surveillance required because of this acute illness that poses a threat to life or bodily function. F. I certify that at the point of admission it is my clinical judgment that the patient will require inpatient hospital care spanning beyond 2 midnights from the point of admission.

## 2016-04-05 NOTE — Telephone Encounter (Signed)
Son asks if pt can get Aranesp while he is in hospital?  It is due tomorrow.  He also wonders if transfusion is needed?  He asks if Dr. Alvy Bimler can talk to MD at [facility] to give orders for Aranesp and also blood transfusion while pt is there and since he is missing his appt for both at our office tomorrow.

## 2016-04-05 NOTE — Progress Notes (Signed)
PROGRESS NOTE        PATIENT DETAILS Name: Anthony Hutchinson Age: 82 y.o. Sex: male Date of Birth: 1921/11/16 Admit Date: 04/04/2016 Admitting Physician Edwin Dada, MD [id number], MD  Brief Narrative: Patient is a 82 y.o. male transfusion dependent myelodysplastic syndrome, chronic kidney disease stage IV, chronic systolic heart failure, treated heart block status post pacemaker placement who was admitted to the hospital with early sepsis likely secondary to UTI.  Subjective: Feels much better, no more fever with chills this morning.  Assessment/Plan: Principal Problem: Sepsis secondary to presumed UTI: Sepsis pathophysiology seems to have rapidly improved. Follow cultures, continue empiric antibiotics  Active Problems: Anemia: Secondary to underlying MDS and chronic kidney disease. Hemoglobin of 9.1-was likely secondary to hemoconcentration-hemoglobin currently 7.8, follow and transfuse accordingly. He is apparently due for darbepoetin injection on 9/28, will order. He is currently asymptomatic, if hemoglobin decreases further, may need PRBC transfusion. Discussed with his primary hematologist-Dr Alvy Bimler over the phone  Thrombocytopenia: Secondary to myelodysplastic syndrome-?sepsis also causing further worsening of thrombocytopenia, no evidence of bleeding. Follow and transfuse accordingly. Discussed with his primary hematologist-Dr Alvy Bimler over the phone  Chronic kidney disease stage IV: Creatinine close to usual baseline, follow.  Chronic systolic heart failure (EF 45-50% by echo in 2015): Euvolemic on exam, follow volume status closely. Continue metoprolol, Resume furosemide in the next few days.  Hypothyroidism: Continue Synthroid  [id number] Uroxatral  History of complete heart block-status post pacemaker placement  Minimally elevated liver enzymes: Appears to be a chronic issue and stable for outpatient follow-up  Minimal  hematochezia: Per his primary hematologist-patient has been having hemorrhoidal bleeds over the past few weeks-start Anusol suppository.  DVT Prophylaxis: SCD's  Code Status:  DNR  Family Communication: Son at bedside   Disposition Plan: Remain inpatient-but will plan on Home health vs SNF on discharge  Antimicrobial agents: Aztreonam 9/27>> Levofloxacin 9/27>>  Procedures: None  CONSULTS:  None  Time spent: 25 minutes-Greater than 50% of this time was spent in counseling, explanation of diagnosis, planning of further management, and coordination of care.  MEDICATIONS: Anti-infectives    Start     Dose/Rate Route Frequency Ordered Stop   04/06/16 2000  levofloxacin (LEVAQUIN) IVPB 500 mg     500 mg 100 mL/hr over 60 Minutes Intravenous Every 48 hours 04/05/16 0146     04/05/16 1000  aztreonam (AZACTAM) 1 g in dextrose 5 % 50 mL IVPB     1 g 100 mL/hr over 30 Minutes Intravenous Every 8 hours 04/05/16 0146     04/05/16 0145  aztreonam (AZACTAM) 2 g in dextrose 5 % 50 mL IVPB     2 g 100 mL/hr over 30 Minutes Intravenous  Once 04/05/16 0137 04/05/16 0304   04/04/16 2330  levofloxacin (LEVAQUIN) IVPB 500 mg     500 mg 100 mL/hr over 60 Minutes Intravenous  Once 04/04/16 2323 04/05/16 0104      Scheduled Meds: . alfuzosin  10 mg Oral QPM  . aztreonam  1 g Intravenous Q8H  . [START ON 04/06/2016] levofloxacin (LEVAQUIN) IV  500 mg Intravenous Q48H  . levothyroxine  125 mcg Oral QAC breakfast  . metoprolol succinate  25 mg Oral Daily   Continuous Infusions:  PRN Meds:.acetaminophen **OR** acetaminophen   PHYSICAL EXAM: Vital signs: Vitals:   04/05/16 0137 04/05/16 0502 04/05/16 1115 04/05/16 1329  BP:  (!) 126/52 (!) 122/48 (!) 119/42  Pulse:  78 64 64  Resp:  18  17  Temp:  98.3 F (36.8 C)  98.4 F (36.9 C)  TempSrc:  Oral  Oral  SpO2:  96%  93%  Weight: 74.4 kg (164 lb 0.4 oz)      Filed Weights   04/05/16 0137  Weight: 74.4 kg (164 lb 0.4 oz)    Body mass index is 27.29 kg/m.   General appearance :Awake, alert, not in any distress. Speech Clear. Not toxic Looking Eyes:, pupils equally reactive to light and accomodation,no scleral icterus. HEENT: Atraumatic and Normocephalic Neck: supple, no JVD. No cervical lymphadenopathy. No thyromegaly Resp:Good air entry bilaterally, no added sounds  CVS: S1 S2 regular, no murmurs.  GI: Bowel sounds present, Non tender and not distended with no gaurding, rigidity or rebound. Extremities: B/L Lower Ext shows no edema, both legs are warm to touch Neurology:  speech clear,Non focal, sensation is grossly intact. Psychiatric: Normal judgment and insight. Alert and oriented x 3. Normal mood. Musculoskeletal:gait appears to be slow but normal.No digital cyanosis Skin:No Rash, warm and dry Wounds:N/A  I have personally reviewed following labs and imaging studies  LABORATORY DATA: CBC:  Recent Labs Lab 04/04/16 2242 04/05/16 0525  WBC 16.5* 11.3*  NEUTROABS 15.1*  --   HGB 9.1* 7.8*  HCT 28.8* 24.5*  MCV 94.7 93.5  PLT 58* 38*    Basic Metabolic Panel:  Recent Labs Lab 04/04/16 2242 04/05/16 0525  NA 138 136  K 4.3 4.5  CL 107 107  CO2 22 18*  GLUCOSE 147* 134*  BUN 32* 33*  CREATININE 2.20* 2.09*  CALCIUM 8.4* 8.0*    GFR: Estimated Creatinine Clearance: 21.3 mL/min (by C-G formula based on SCr of 2.09 mg/dL (H)).  Liver Function Tests:  Recent Labs Lab 04/04/16 2242 04/05/16 0525  AST 44* 44*  ALT 35 28  ALKPHOS 195* 150*  BILITOT 1.7* 1.5*  PROT 6.6 5.7*  ALBUMIN 3.0* 2.4*   No results for input(s): LIPASE, AMYLASE in the last 168 hours. No results for input(s): AMMONIA in the last 168 hours.  Coagulation Profile: No results for input(s): INR, PROTIME in the last 168 hours.  Cardiac Enzymes: No results for input(s): CKTOTAL, CKMB, CKMBINDEX, TROPONINI in the last 168 hours.  BNP (last 3 results) No results for input(s): PROBNP in the last 8760  hours.  HbA1C: No results for input(s): HGBA1C in the last 72 hours.  CBG: No results for input(s): GLUCAP in the last 168 hours.  Lipid Profile: No results for input(s): CHOL, HDL, LDLCALC, TRIG, CHOLHDL, LDLDIRECT in the last 72 hours.  Thyroid Function Tests: No results for input(s): TSH, T4TOTAL, FREET4, T3FREE, THYROIDAB in the last 72 hours.  Anemia Panel: No results for input(s): VITAMINB12, FOLATE, FERRITIN, TIBC, IRON, RETICCTPCT in the last 72 hours.  Urine analysis:    Component Value Date/Time   COLORURINE YELLOW 04/04/2016 2231   APPEARANCEUR CLOUDY (A) 04/04/2016 2231   LABSPEC 1.020 04/04/2016 2231   PHURINE 7.0 04/04/2016 2231   GLUCOSEU NEGATIVE 04/04/2016 2231   HGBUR SMALL (A) 04/04/2016 2231   BILIRUBINUR NEGATIVE 04/04/2016 2231   KETONESUR NEGATIVE 04/04/2016 2231   PROTEINUR 30 (A) 04/04/2016 2231   UROBILINOGEN 0.2 10/31/2013 1750   NITRITE NEGATIVE 04/04/2016 2231   LEUKOCYTESUR MODERATE (A) 04/04/2016 2231    Sepsis Labs: Lactic Acid, Venous    Component Value Date/Time   LATICACIDVEN 1.26 04/04/2016 2255  MICROBIOLOGY: No results found for this or any previous visit (from the past 240 hour(s)).  RADIOLOGY STUDIES/RESULTS: Dg Chest 2 View  Result Date: 04/05/2016 CLINICAL DATA:  Fever, hypertension, diabetic EXAM: CHEST  2 VIEW COMPARISON:  02/18/2015 FINDINGS: AP semi-erect chest. Left-sided pacing device with leads overlying the right atrium and right ventricle. Additional right-sided central venous port with tip overlying the SVC. There are low lung volumes. There is interval enlargement of the heart size, now mildly enlarged. Central vascular congestion is present. Diffuse interstitial opacities are present, likely related to edema. There are tiny bilateral effusions. There is ill-defined opacity within the left greater than right lung bases, possible infiltrate. There is atherosclerosis of the aorta. There is no pneumothorax. Surgical  clips at the base of the right neck. IMPRESSION: 1. Low lung volumes. Interval enlargement of the cardiomediastinal silhouette with central vascular congestion present. Diffuse interstitial opacities likely reflect edema upon chronic change. 2. Tiny bilateral effusions. Ill-defined bibasilar opacities, left greater than right could relate to atelectasis or infiltrates. Linear scarring or atelectasis in the right mid lung zone. Electronically Signed   By: Donavan Foil M.D.   On: 04/05/2016 00:09     LOS: 0 days   Oren Binet, MD  [facility] Pager:[phone]  If 7PM-7AM, please contact night-coverage [email or web address] Password [facility] 04/05/2016, 4:06 PM

## 2016-04-05 NOTE — Progress Notes (Signed)
Pharmacy Antibiotic Note  Anthony Hutchinson is a 82 y.o. male admitted on 04/04/2016 with UTI.  Pharmacy has been consulted for Levaquin/Aztreonam dosing. WBC is elevated. Noted renal dysfunction.   Plan: -Levaquin 500 mg IV q48h -Aztreonam 1g IV q8h -Trend WBC, temp, renal function  -Can likely de-escalate to monotherapy quickly  Weight: 164 lb 0.4 oz (74.4 kg)  Temp (24hrs), Avg:99.6 F (37.6 C), Min:98.4 F (36.9 C), Max:102.3 F (39.1 C)   Recent Labs Lab 04/04/16 2242 04/04/16 2255  WBC 16.5*  --   CREATININE 2.20*  --   LATICACIDVEN  --  1.26    Estimated Creatinine Clearance: 20.2 mL/min (by C-G formula based on SCr of 2.2 mg/dL (H)).    Allergies  Allergen Reactions  . Cephalosporins Rash  . Codeine Sulfate Itching    Itching on fingers   . Penicillins Other (See Comments)    Itching on fingers Has patient had a PCN reaction causing immediate rash, facial/tongue/throat swelling, SOB or lightheadedness with hypotension: Has patient had a PCN reaction causing severe rash involving mucus membranes or skin necrosis: Has patient had a PCN reaction that required hospitalization Has patient had a PCN reaction occurring within the last 10 years:  If all of the above answers are "NO", then may proceed with Cephalosporin use.     Anthony Hutchinson 04/05/2016 1:52 AM

## 2016-04-06 ENCOUNTER — Other Ambulatory Visit: Payer: Medicare Other

## 2016-04-06 ENCOUNTER — Ambulatory Visit: Payer: Medicare Other

## 2016-04-06 LAB — CBC
HCT: 25.4 % — ABNORMAL LOW (ref 39.0–52.0)
Hemoglobin: 7.9 g/dL — ABNORMAL LOW (ref 13.0–17.0)
MCH: 29.4 pg (ref 26.0–34.0)
MCHC: 31.1 g/dL (ref 30.0–36.0)
MCV: 94.4 fL (ref 78.0–100.0)
PLATELETS: 41 10*3/uL — AB (ref 150–400)
RBC: 2.69 MIL/uL — ABNORMAL LOW (ref 4.22–5.81)
RDW: 22.2 % — ABNORMAL HIGH (ref 11.5–15.5)
WBC: 3.9 10*3/uL — ABNORMAL LOW (ref 4.0–10.5)

## 2016-04-06 MED ORDER — FUROSEMIDE 20 MG PO TABS
20.0000 mg | ORAL_TABLET | Freq: Every day | ORAL | Status: DC
  Administered 2016-04-06: 20 mg via ORAL
  Filled 2016-04-06: qty 1

## 2016-04-06 NOTE — Progress Notes (Addendum)
PROGRESS NOTE        PATIENT DETAILS Name: Anthony Hutchinson Age: 82 y.o. Sex: male Date of Birth: 04-23-1922 Admit Date: 04/04/2016 Admitting Physician Edwin Dada, MD [id number], MD  Brief Narrative: Patient is a 82 y.o. male transfusion dependent myelodysplastic syndrome, chronic kidney disease stage IV, chronic systolic heart failure, treated heart block status post pacemaker placement who was admitted to the hospital with early sepsis likely secondary to UTI.  Subjective: Feels much better, fever overnight.  No chest pain or shortness of breath.  Assessment/Plan: Principal Problem: Sepsis secondary to presumed UTI: Sepsis pathophysiology seems to have rapidly improved. Unfortunately, cultures were never sent at admission-I will discontinue Azactam and just continue levofloxacin. If continues to do well, home in the next few days.  Active Problems: Anemia: Secondary to underlying MDS and chronic kidney disease. Hemoglobin of 9.1 on admission-was likely secondary to hemoconcentration-hemoglobin currently 7.9, and close to his usual baseline. He is asymptomatic at this time. Have ordered darbepoetin injections after speaking with primary hematologist over the phone on 9/27. We'll transfuse if hemoglobin decreases significantly  Thrombocytopenia: Secondary to myelodysplastic syndrome-?sepsis also causing further worsening of thrombocytopenia-however now stabilizing and actually improving. No evidence of bleeding. Follow and transfuse if needed  Chronic kidney disease stage IV: Creatinine close to usual baseline, follow.  Chronic systolic heart failure (EF 45-50% by echo in 2015): Euvolemic on exam, follow volume status closely. Continue metoprolol, Resume furosemide today.  Hypothyroidism: Continue Synthroid  [id number] Uroxatral  History of complete heart block-status post pacemaker placement  Minimally elevated liver enzymes:  Appears to be a chronic issue and stable for outpatient follow-up  Minimal hematochezia: Per his primary hematologist-patient has been having hemorrhoidal bleeds over the past few weeks-start Anusol suppository.  DVT Prophylaxis: SCD's  Code Status:  DNR  Family Communication: None at bedside  Disposition Plan: Remain inpatient-but will plan on Home health vs SNF on discharge-await PT eval  Antimicrobial agents: Aztreonam 9/27>> 9/28 Levofloxacin 9/27>>  Procedures: None  CONSULTS:  None  Time spent: 25 minutes-Greater than 50% of this time was spent in counseling, explanation of diagnosis, planning of further management, and coordination of care.  MEDICATIONS: Anti-infectives    Start     Dose/Rate Route Frequency Ordered Stop   04/06/16 2000  levofloxacin (LEVAQUIN) IVPB 500 mg     500 mg 100 mL/hr over 60 Minutes Intravenous Every 48 hours 04/05/16 0146     04/05/16 1000  aztreonam (AZACTAM) 1 g in dextrose 5 % 50 mL IVPB     1 g 100 mL/hr over 30 Minutes Intravenous Every 8 hours 04/05/16 0146     04/05/16 0145  aztreonam (AZACTAM) 2 g in dextrose 5 % 50 mL IVPB     2 g 100 mL/hr over 30 Minutes Intravenous  Once 04/05/16 0137 04/05/16 0304   04/04/16 2330  levofloxacin (LEVAQUIN) IVPB 500 mg     500 mg 100 mL/hr over 60 Minutes Intravenous  Once 04/04/16 2323 04/05/16 0104      Scheduled Meds: . alfuzosin  10 mg Oral QPM  . aztreonam  1 g Intravenous Q8H  . Darbepoetin Alfa  500 mcg Subcutaneous Once  . hydrocortisone  25 mg Rectal BID  . levofloxacin (LEVAQUIN) IV  500 mg Intravenous Q48H  . levothyroxine  125 mcg Oral QAC breakfast  . metoprolol succinate  25 mg Oral Daily   Continuous Infusions:  PRN Meds:.acetaminophen **OR** acetaminophen   PHYSICAL EXAM: Vital signs: Vitals:   04/05/16 1115 04/05/16 1329 04/05/16 2100 04/06/16 0518  BP: (!) 122/48 (!) 119/42 (!) 113/50 (!) 140/59  Pulse: 64 64 77 68  Resp:  17 16 16   Temp:  98.4 F (36.9  C) 98.1 F (36.7 C) 98.2 F (36.8 C)  TempSrc:  Oral Oral   SpO2:  93% 99% 96%  Weight:    77.1 kg (169 lb 15.6 oz)   Filed Weights   04/05/16 0137 04/06/16 0518  Weight: 74.4 kg (164 lb 0.4 oz) 77.1 kg (169 lb 15.6 oz)   Body mass index is 28.29 kg/m.   General appearance :Awake, alert, not in any distress. Speech Clear. Not toxic Looking frail and chronically ill-appearing Eyes:, pupils equally reactive to light and accomodation,no scleral icterus. HEENT: Atraumatic and Normocephalic Neck: supple, no JVD. No cervical lymphadenopathy. No thyromegaly Resp:Good air entry bilaterally, no added sounds  CVS: S1 S2 regular, no murmurs.  GI: Bowel sounds present, Non tender and not distended with no gaurding, rigidity or rebound. Extremities: B/L Lower Ext shows no edema, both legs are warm to touch Neurology:  speech clear,Non focal, sensation is grossly intact. Psychiatric: Normal judgment and insight. Alert and oriented x 3. Normal mood. Musculoskeletal:gait appears to be slow but normal.No digital cyanosis Skin:No Rash, warm and dry Wounds:N/A  I have personally reviewed following labs and imaging studies  LABORATORY DATA: CBC:  Recent Labs Lab 04/04/16 2242 04/05/16 0525 04/06/16 0729  WBC 16.5* 11.3* 3.9*  NEUTROABS 15.1*  --   --   HGB 9.1* 7.8* 7.9*  HCT 28.8* 24.5* 25.4*  MCV 94.7 93.5 94.4  PLT 58* 38* 41*    Basic Metabolic Panel:  Recent Labs Lab 04/04/16 2242 04/05/16 0525  NA 138 136  K 4.3 4.5  CL 107 107  CO2 22 18*  GLUCOSE 147* 134*  BUN 32* 33*  CREATININE 2.20* 2.09*  CALCIUM 8.4* 8.0*    GFR: Estimated Creatinine Clearance: 21.6 mL/min (by C-G formula based on SCr of 2.09 mg/dL (H)).  Liver Function Tests:  Recent Labs Lab 04/04/16 2242 04/05/16 0525  AST 44* 44*  ALT 35 28  ALKPHOS 195* 150*  BILITOT 1.7* 1.5*  PROT 6.6 5.7*  ALBUMIN 3.0* 2.4*   No results for input(s): LIPASE, AMYLASE in the last 168 hours. No results  for input(s): AMMONIA in the last 168 hours.  Coagulation Profile: No results for input(s): INR, PROTIME in the last 168 hours.  Cardiac Enzymes: No results for input(s): CKTOTAL, CKMB, CKMBINDEX, TROPONINI in the last 168 hours.  BNP (last 3 results) No results for input(s): PROBNP in the last 8760 hours.  HbA1C: No results for input(s): HGBA1C in the last 72 hours.  CBG: No results for input(s): GLUCAP in the last 168 hours.  Lipid Profile: No results for input(s): CHOL, HDL, LDLCALC, TRIG, CHOLHDL, LDLDIRECT in the last 72 hours.  Thyroid Function Tests: No results for input(s): TSH, T4TOTAL, FREET4, T3FREE, THYROIDAB in the last 72 hours.  Anemia Panel: No results for input(s): VITAMINB12, FOLATE, FERRITIN, TIBC, IRON, RETICCTPCT in the last 72 hours.  Urine analysis:    Component Value Date/Time   COLORURINE YELLOW 04/04/2016 2231   APPEARANCEUR CLOUDY (A) 04/04/2016 2231   LABSPEC 1.020 04/04/2016 2231   PHURINE 7.0 04/04/2016 2231   GLUCOSEU NEGATIVE 04/04/2016 2231   HGBUR SMALL (A) 04/04/2016 2231   BILIRUBINUR  NEGATIVE 04/04/2016 2231   KETONESUR NEGATIVE 04/04/2016 2231   PROTEINUR 30 (A) 04/04/2016 2231   UROBILINOGEN 0.2 10/31/2013 1750   NITRITE NEGATIVE 04/04/2016 2231   LEUKOCYTESUR MODERATE (A) 04/04/2016 2231    Sepsis Labs: Lactic Acid, Venous    Component Value Date/Time   LATICACIDVEN 1.26 04/04/2016 2255    MICROBIOLOGY: No results found for this or any previous visit (from the past 240 hour(s)).  RADIOLOGY STUDIES/RESULTS: Dg Chest 2 View  Result Date: 04/05/2016 CLINICAL DATA:  Fever, hypertension, diabetic EXAM: CHEST  2 VIEW COMPARISON:  02/18/2015 FINDINGS: AP semi-erect chest. Left-sided pacing device with leads overlying the right atrium and right ventricle. Additional right-sided central venous port with tip overlying the SVC. There are low lung volumes. There is interval enlargement of the heart size, now mildly enlarged. Central  vascular congestion is present. Diffuse interstitial opacities are present, likely related to edema. There are tiny bilateral effusions. There is ill-defined opacity within the left greater than right lung bases, possible infiltrate. There is atherosclerosis of the aorta. There is no pneumothorax. Surgical clips at the base of the right neck. IMPRESSION: 1. Low lung volumes. Interval enlargement of the cardiomediastinal silhouette with central vascular congestion present. Diffuse interstitial opacities likely reflect edema upon chronic change. 2. Tiny bilateral effusions. Ill-defined bibasilar opacities, left greater than right could relate to atelectasis or infiltrates. Linear scarring or atelectasis in the right mid lung zone. Electronically Signed   By: Donavan Foil M.D.   On: 04/05/2016 00:09     LOS: 1 day   Oren Binet, MD  [facility] Pager:[phone]  If 7PM-7AM, please contact night-coverage [email or web address] Password [facility] 04/06/2016, 11:56 AM

## 2016-04-06 NOTE — Telephone Encounter (Signed)
I spoke with hospitalist and he will prescribe

## 2016-04-06 NOTE — Evaluation (Signed)
Physical Therapy Evaluation Patient Details Name: Anthony Hutchinson MRN: [id number] DOB: [birth year] Today's Date: 04/06/2016   History of Present Illness  Pt is a pleasant 82 y/o male admitted with a UTI and confusion. PMH including but not limited to mild dementia, CKD, CHF, HTN, DM, PVD and hx of pacemaker placement in 2015.  Clinical Impression  Pt presented supine in bed with HOB elevated, awake and willing to participate in therapy session. Pt's daughter was present throughout session as well. Prior to admission, pt reported using a 4-wheeled RW all the time during ambulation. He stated that he lives with his son who works as a Geophysicist/field seismologist and does not have a regular work schedule; therefore, some days he is available to provide 24 hr supervision and other days intermittent supervision. Pt stated that prior to admission he was independent with all ADLs. Pt and daughter very firmly stated that pt will be d/c'ing home from the hospital versus going to rehab/SNF. PT recommending pt receive HH PT services and have 24 hr supervision. Pt would continue to benefit from skilled physical therapy services at this time while admitted and after d/c to address his below listed limitations in order to improve his overall safety and independence with functional mobility.     Follow Up Recommendations Home health PT;Supervision/Assistance - 24 hour    Equipment Recommendations  None recommended by PT;Other (comment) (pt reported having all necessary DME at home)    Recommendations for Other Services       Precautions / Restrictions Precautions Precautions: Fall;ICD/Pacemaker Restrictions Weight Bearing Restrictions: No      Mobility  Bed Mobility Overal bed mobility: Needs Assistance Bed Mobility: Supine to Sit     Supine to sit: Min guard;HOB elevated     General bed mobility comments: pt required increased time, use of bed rails and min guard for safety  Transfers Overall transfer level:  Needs assistance Equipment used: Rolling walker (2 wheeled) Transfers: Sit to/from [facility] Sit to Stand: Min guard Stand pivot transfers: Min assist       General transfer comment: pt required increased time, VC'ing for bilateral hand placement, and min A with pivotal steps and movement of RW to transfer to recliner  Ambulation/Gait                Stairs            Wheelchair Mobility    Modified Rankin (Stroke Patients Only)       Balance Overall balance assessment: Needs assistance Sitting-balance support: Feet supported;Bilateral upper extremity supported Sitting balance-Leahy Scale: Poor     Standing balance support: During functional activity;Bilateral upper extremity supported Standing balance-Leahy Scale: Poor Standing balance comment: pt reliant on bilateral UEs on RW                             Pertinent Vitals/Pain Pain Assessment: No/denies pain    Home Living Family/patient expects to be discharged to:: Private residence Living Arrangements: Children Available Help at Discharge: Family;Available 24 hours/day;Available PRN/intermittently;Other (Comment) (lives with son who works as a Geophysicist/field seismologist, no regular sched) Type of Home: House Home Access: Stairs to enter May: Can reach both [place] of Steps: [place]: One [place]: Bath - 4 wheels;Cane - single point;Shower seat;Grab bars - tub/shower;Wheelchair - manual      Prior Function Level of Independence: Independent with assistive device(s)  Comments: Pt stated that he always uses a RW to ambulate      Hand Dominance        Extremity/Trunk Assessment   Upper Extremity Assessment: Generalized weakness           Lower Extremity Assessment: Generalized weakness         Communication   Communication: HOH  Cognition Arousal/Alertness: Awake/alert Behavior During Therapy: WFL for  tasks assessed/performed Overall Cognitive Status: History of cognitive impairments - at baseline       Memory: Decreased short-term memory              General Comments      Exercises     Assessment/Plan    PT Assessment Patient needs continued PT services  PT Problem List Decreased strength;Decreased range of motion;Decreased activity tolerance;Decreased balance;Decreased mobility;Decreased coordination;Decreased knowledge of use of DME          PT Treatment Interventions DME instruction;Gait training;Stair training;Functional mobility training;Therapeutic activities;Therapeutic exercise;Balance training;Neuromuscular re-education;Patient/family education    PT Goals (Current goals can be found in the Care Plan section)  Acute Rehab PT Goals Patient Stated Goal: return home PT Goal Formulation: With patient/family Time For Goal Achievement: 04/20/16 Potential to Achieve Goals: Good    Frequency Min 3X/week   Barriers to discharge        Co-evaluation               End of Session Equipment Utilized During Treatment: Gait belt Activity Tolerance: Patient limited by fatigue Patient left: in chair;with call bell/phone within reach;with chair alarm set;with family/visitor present Nurse Communication: Mobility status         Time: 1634-1710 PT Time Calculation (min) (ACUTE ONLY): 36 min   Charges:   PT Evaluation $PT Eval Moderate Complexity: 1 Procedure PT Treatments $Therapeutic Activity: 8-22 mins   PT G CodesClearnce Sorrel Yishai Rehfeld 04/06/2016, 5:59 PM Sherie Don, [place], DPT [phone]

## 2016-04-07 ENCOUNTER — Encounter: Payer: Self-pay | Admitting: Cardiology

## 2016-04-07 LAB — CBC
HCT: 26.4 % — ABNORMAL LOW (ref 39.0–52.0)
HEMOGLOBIN: 8.3 g/dL — AB (ref 13.0–17.0)
MCH: 29.4 pg (ref 26.0–34.0)
MCHC: 31.4 g/dL (ref 30.0–36.0)
MCV: 93.6 fL (ref 78.0–100.0)
PLATELETS: 51 10*3/uL — AB (ref 150–400)
RBC: 2.82 MIL/uL — ABNORMAL LOW (ref 4.22–5.81)
RDW: 21.9 % — AB (ref 11.5–15.5)
WBC: 4.9 10*3/uL (ref 4.0–10.5)

## 2016-04-07 MED ORDER — LEVOFLOXACIN 500 MG PO TABS: 500.0000 mg | ORAL_TABLET | ORAL | 0 refills | Status: DC

## 2016-04-07 MED ORDER — HYDROCORTISONE ACETATE 25 MG RE SUPP: 25.0000 mg | Freq: Two times a day (BID) | RECTAL | 0 refills | Status: DC | PRN

## 2016-04-07 NOTE — Progress Notes (Signed)
Pt still with productive cough, no SOB. No complaints. Discharge instructions given to pt's son, verbalized understanding. Discharged home accompanied by son.

## 2016-04-07 NOTE — Discharge Summary (Signed)
PATIENT DETAILS Name: Anthony Hutchinson Age: 82 y.o. Sex: male Date of Birth: February 03, 1922 MRN: [id number]. Admitting Physician: Edwin Dada, MD [id number], MD  Admit Date: 04/04/2016 Discharge date: 04/07/2016  Recommendations for Outpatient Follow-up:  1. Follow up with PCP in 1-2 weeks 2. Please obtain BMP/CBC in one week 3. Ensure follow up with Hem/Onc   Admitted From:  Home  Disposition: Home with home health services   [place]:  Ye  Equipment/Devices: None  Discharge Condition: Stable  CODE STATUS: DNR  Diet recommendation:  Heart Health  Brief Summary: See H&P, Labs, Consult and Test reports for all details in brief, Patient is a 82 y.o. male transfusion dependent myelodysplastic syndrome, chronic kidney disease stage IV, chronic systolic heart failure, treated heart block status post pacemaker placement who was admitted to the hospital with early sepsis likely secondary to UTI.   Brief Hospital Course: Sepsis secondary to presumed UTI: Sepsis pathophysiology has resolved. He was empirically started on Azactam and Levaquin. He has been afebrile since admission, he feels great and wants to go home today. Unfortunately, cultures were never sent at admission-since he had received multiple doses of Abx-and had clinically improved-will empirically treat with Levaquin for a few more doses on discharge.   Anemia: Secondary to underlying MDS and chronic kidney disease. Hemoglobin of 9.1 on admission-was likely secondary to hemoconcentration-hemoglobin currently 7.9, and close to his usual baseline. He is asymptomatic at this time.Was given darbepoetin injection after speaking with primary hematologist over the phone on 9/27. He does require periodic transfusions as outpatient.  Thrombocytopenia: Secondary to myelodysplastic syndrome-?sepsis also causing further worsening of thrombocytopenia-however now stabilizing and actually improving. No  evidence of bleeding. Follow closely as outpatient with hematology.  Chronic kidney disease stage IV: Creatinine close to usual baseline, follow.  Chronic systolic heart failure (EF 45-50% by echo in 2015): Euvolemic on exam, follow volume status closely. Continue metoprolol and  furosemide   Hypothyroidism: Continue Synthroid  [id number] Uroxatral  History of complete heart block-status post pacemaker placement  Minimally elevated liver enzymes: Appears to be a chronic issue and stable for outpatient follow-up  Minimal hematochezia: Per his primary hematologist-patient has been having hemorrhoidal bleeds over the past few weeks-continue prn Anusol suppository.  Procedures/Studies: None  Discharge Diagnoses:  Principal Problem:   UTI (lower urinary tract infection) Active Problems:   Anemia associated with chronic renal failure   Chronic systolic heart failure (HCC)   Complete heart block (HCC)   MDS (myelodysplastic syndrome) (HCC)   Thrombocytopenia (HCC)   Pacemaker   Stage III chronic kidney disease   Discharge Instructions:  Activity:  As tolerated with Full fall precautions use walker/cane & assistance as needed  Discharge Instructions    Call MD for:  persistant nausea and vomiting    Complete by:  As directed    Call MD for:  temperature >100.4    Complete by:  As directed    Diet - low sodium heart healthy    Complete by:  As directed    Discharge instructions    Complete by:  As directed    Follow with Primary MD  Merrilee Seashore, MD  and other consultant's as instructed your Hospitalist MD  Please get a complete blood count and chemistry panel checked by your Primary MD at your next visit, and again as instructed by your Primary MD.  Get Medicines reviewed and adjusted: Please take all your medications with you for your next visit with your Primary MD  Laboratory/radiological data: Please request your Primary MD to go over all hospital  tests and procedure/radiological results at the follow up, please ask your Primary MD to get all Hospital records sent to his/her office.  In some cases, they will be blood work, cultures and biopsy results pending at the time of your discharge. Please request that your primary care M.D. follows up on these results.  Also Note the following: If you experience worsening of your admission symptoms, develop shortness of breath, life threatening emergency, suicidal or homicidal thoughts you must seek medical attention immediately by calling 911 or calling your MD immediately  if symptoms less severe.  You must read complete instructions/literature along with all the possible adverse reactions/side effects for all the Medicines you take and that have been prescribed to you. Take any new Medicines after you have completely understood and accpet all the possible adverse reactions/side effects.   Do not drive when taking Pain medications or sleeping medications (Benzodaizepines)  Do not take more than prescribed Pain, Sleep and Anxiety Medications. It is not advisable to combine anxiety,sleep and pain medications without talking with your primary care practitioner  Special Instructions: If you have smoked or chewed Tobacco  in the last 2 yrs please stop smoking, stop any regular Alcohol  and or any Recreational drug use.  Wear Seat belts while driving.  Please note: You were cared for by a hospitalist during your hospital stay. Once you are discharged, your primary care physician will handle any further medical issues. Please note that NO REFILLS for any discharge medications will be authorized once you are discharged, as it is imperative that you return to your primary care physician (or establish a relationship with a primary care physician if you do not have one) for your post hospital discharge needs so that they can reassess your need for medications and monitor your lab values.   Increase activity  slowly    Complete by:  As directed        Medication List    TAKE these medications   acetaminophen 500 MG tablet Commonly known as:  TYLENOL Take 500 mg by mouth every 6 (six) hours as needed (pain).   alfuzosin 10 MG 24 hr tablet Commonly known as:  UROXATRAL Take 10 mg by mouth every evening.   aminocaproic acid 500 MG tablet Commonly known as:  AMICAR Take 1 tablet (500 mg total) by mouth every 8 (eight) hours as needed for bleeding.   calcium carbonate 500 MG chewable tablet Commonly known as:  TUMS - dosed in mg elemental calcium Chew 1 tablet by mouth daily as needed for indigestion or heartburn.   furosemide 20 MG tablet Commonly known as:  LASIX Take 1 tablet (20 mg total) by mouth daily.   hydrocortisone 25 MG suppository Commonly known as:  ANUSOL-HC Place 1 suppository (25 mg total) rectally 2 (two) times daily as needed for hemorrhoids or itching.   levofloxacin 500 MG tablet Commonly known as:  LEVAQUIN Take 1 tablet (500 mg total) by mouth every other day. Start taking on:  04/08/2016   lidocaine-prilocaine cream Commonly known as:  EMLA Apply 1 application topically as needed. Apply to port 1 hour prior to access.   loperamide 2 MG capsule Commonly known as:  IMODIUM Take 2 mg by mouth daily as needed for diarrhea or loose stools.   multivitamin with minerals Tabs tablet Take 1 tablet by mouth every morning.   SYNTHROID 125 MCG tablet Generic drug:  levothyroxine  Take 125 mcg by mouth daily before breakfast.   TOPROL XL 25 MG 24 hr tablet Generic drug:  metoprolol succinate Take 25 mg by mouth daily.      Follow-up Information    RAMACHANDRAN,AJITH, MD. Schedule an appointment as soon as possible for a visit in 1 week(s).   Specialty:  Internal Medicine Contact information: [place] [address] [phone]        Heath Lark, MD .   Specialty:  Hematology and Oncology Why:  keep next  appointment Contact information: [place] [address] [phone]          Allergies  Allergen Reactions  . Cephalosporins Rash  . Codeine Sulfate Itching    Itching on fingers   . Penicillins Other (See Comments)    Itching on fingers Has patient had a PCN reaction causing immediate rash, facial/tongue/throat swelling, SOB or lightheadedness with hypotension: Has patient had a PCN reaction causing severe rash involving mucus membranes or skin necrosis: Has patient had a PCN reaction that required hospitalization Has patient had a PCN reaction occurring within the last 10 years:  If all of the above answers are "NO", then may proceed with Cephalosporin use.       Consultations:   None   Other Procedures/Studies: Dg Chest 2 View  Result Date: 04/05/2016 CLINICAL DATA:  Fever, hypertension, diabetic EXAM: CHEST  2 VIEW COMPARISON:  02/18/2015 FINDINGS: AP semi-erect chest. Left-sided pacing device with leads overlying the right atrium and right ventricle. Additional right-sided central venous port with tip overlying the SVC. There are low lung volumes. There is interval enlargement of the heart size, now mildly enlarged. Central vascular congestion is present. Diffuse interstitial opacities are present, likely related to edema. There are tiny bilateral effusions. There is ill-defined opacity within the left greater than right lung bases, possible infiltrate. There is atherosclerosis of the aorta. There is no pneumothorax. Surgical clips at the base of the right neck. IMPRESSION: 1. Low lung volumes. Interval enlargement of the cardiomediastinal silhouette with central vascular congestion present. Diffuse interstitial opacities likely reflect edema upon chronic change. 2. Tiny bilateral effusions. Ill-defined bibasilar opacities, left greater than right could relate to atelectasis or infiltrates. Linear scarring or atelectasis in the right mid lung zone.  Electronically Signed   By: Donavan Foil M.D.   On: 04/05/2016 00:09     TODAY-DAY OF DISCHARGE:  Subjective:   Anthony Hutchinson today has no headache,no chest abdominal pain,no new weakness tingling or numbness, feels much better wants to go home today.   Objective:   Blood pressure (!) 119/57, pulse 71, temperature 98.6 F (37 C), resp. rate 17, weight 77.7 kg (171 lb 4.8 oz), SpO2 97 %.  Intake/Output Summary (Last 24 hours) at 04/07/16 0803 Last data filed at 04/07/16 0500  Gross per 24 hour  Intake              340 ml  Output             1250 ml  Net             -910 ml   Filed Weights   04/05/16 0137 04/06/16 0518 04/07/16 0500  Weight: 74.4 kg (164 lb 0.4 oz) 77.1 kg (169 lb 15.6 oz) 77.7 kg (171 lb 4.8 oz)    Exam: Awake Alert, Oriented *3, No new F.N deficits, Normal affect Fisher.AT,PERRAL Supple Neck,No JVD, No cervical lymphadenopathy appriciated.  Symmetrical Chest wall movement, Good  air movement bilaterally, CTAB RRR,No Gallops,Rubs or new Murmurs, No Parasternal Heave +ve B.Sounds, Abd Soft, Non tender, No organomegaly appriciated, No rebound -guarding or rigidity. No Cyanosis, Clubbing or edema, No new Rash or bruise   PERTINENT RADIOLOGIC STUDIES: Dg Chest 2 View  Result Date: 04/05/2016 CLINICAL DATA:  Fever, hypertension, diabetic EXAM: CHEST  2 VIEW COMPARISON:  02/18/2015 FINDINGS: AP semi-erect chest. Left-sided pacing device with leads overlying the right atrium and right ventricle. Additional right-sided central venous port with tip overlying the SVC. There are low lung volumes. There is interval enlargement of the heart size, now mildly enlarged. Central vascular congestion is present. Diffuse interstitial opacities are present, likely related to edema. There are tiny bilateral effusions. There is ill-defined opacity within the left greater than right lung bases, possible infiltrate. There is atherosclerosis of the aorta. There is no pneumothorax. Surgical  clips at the base of the right neck. IMPRESSION: 1. Low lung volumes. Interval enlargement of the cardiomediastinal silhouette with central vascular congestion present. Diffuse interstitial opacities likely reflect edema upon chronic change. 2. Tiny bilateral effusions. Ill-defined bibasilar opacities, left greater than right could relate to atelectasis or infiltrates. Linear scarring or atelectasis in the right mid lung zone. Electronically Signed   By: Donavan Foil M.D.   On: 04/05/2016 00:09     PERTINENT LAB RESULTS: CBC:  Recent Labs  04/06/16 0729 04/07/16 0647  WBC 3.9* 4.9  HGB 7.9* 8.3*  HCT 25.4* 26.4*  PLT 41* 51*   CMET CMP     Component Value Date/Time   NA 136 04/05/2016 0525   NA 138 01/05/2014 1159   K 4.5 04/05/2016 0525   K 4.6 01/05/2014 1159   CL 107 04/05/2016 0525   CO2 18 (L) 04/05/2016 0525   CO2 25 01/05/2014 1159   GLUCOSE 134 (H) 04/05/2016 0525   GLUCOSE 94 01/05/2014 1159   BUN 33 (H) 04/05/2016 0525   BUN 25.0 01/05/2014 1159   CREATININE 2.09 (H) 04/05/2016 0525   CREATININE 2.22 (H) 12/21/2015 1110   CREATININE 1.7 (H) 01/05/2014 1159   CALCIUM 8.0 (L) 04/05/2016 0525   CALCIUM 9.3 01/05/2014 1159   PROT 5.7 (L) 04/05/2016 0525   PROT 7.1 01/05/2014 1159   ALBUMIN 2.4 (L) 04/05/2016 0525   ALBUMIN 3.7 01/05/2014 1159   AST 44 (H) 04/05/2016 0525   AST 64 (H) 01/05/2014 1159   ALT 28 04/05/2016 0525   ALT 70 (H) 01/05/2014 1159   ALKPHOS 150 (H) 04/05/2016 0525   ALKPHOS 254 (H) 01/05/2014 1159   BILITOT 1.5 (H) 04/05/2016 0525   BILITOT 0.91 01/05/2014 1159   GFRNONAA 26 (L) 04/05/2016 0525   GFRAA 30 (L) 04/05/2016 0525    GFR Estimated Creatinine Clearance: 21.7 mL/min (by C-G formula based on SCr of 2.09 mg/dL (H)). No results for input(s): LIPASE, AMYLASE in the last 72 hours. No results for input(s): CKTOTAL, CKMB, CKMBINDEX, TROPONINI in the last 72 hours. Invalid input(s): POCBNP No results for input(s): DDIMER in the  last 72 hours. No results for input(s): HGBA1C in the last 72 hours. No results for input(s): CHOL, HDL, LDLCALC, TRIG, CHOLHDL, LDLDIRECT in the last 72 hours. No results for input(s): TSH, T4TOTAL, T3FREE, THYROIDAB in the last 72 hours.  Invalid input(s): FREET3 No results for input(s): VITAMINB12, FOLATE, FERRITIN, TIBC, IRON, RETICCTPCT in the last 72 hours. Coags: No results for input(s): INR in the last 72 hours.  Invalid input(s): PT Microbiology: No results found for this or  any previous visit (from the past 240 hour(s)).  FURTHER DISCHARGE INSTRUCTIONS:  Get Medicines reviewed and adjusted: Please take all your medications with you for your next visit with your Primary MD  Laboratory/radiological data: Please request your Primary MD to go over all hospital tests and procedure/radiological results at the follow up, please ask your Primary MD to get all Hospital records sent to his/her office.  In some cases, they will be blood work, cultures and biopsy results pending at the time of your discharge. Please request that your primary care M.D. goes through all the records of your hospital data and follows up on these results.  Also Note the following: If you experience worsening of your admission symptoms, develop shortness of breath, life threatening emergency, suicidal or homicidal thoughts you must seek medical attention immediately by calling 911 or calling your MD immediately  if symptoms less severe.  You must read complete instructions/literature along with all the possible adverse reactions/side effects for all the Medicines you take and that have been prescribed to you. Take any new Medicines after you have completely understood and accpet all the possible adverse reactions/side effects.   Do not drive when taking Pain medications or sleeping medications (Benzodaizepines)  Do not take more than prescribed Pain, Sleep and Anxiety Medications. It is not advisable to  combine anxiety,sleep and pain medications without talking with your primary care practitioner  Special Instructions: If you have smoked or chewed Tobacco  in the last 2 yrs please stop smoking, stop any regular Alcohol  and or any Recreational drug use.  Wear Seat belts while driving.  Please note: You were cared for by a hospitalist during your hospital stay. Once you are discharged, your primary care physician will handle any further medical issues. Please note that NO REFILLS for any discharge medications will be authorized once you are discharged, as it is imperative that you return to your primary care physician (or establish a relationship with a primary care physician if you do not have one) for your post hospital discharge needs so that they can reassess your need for medications and monitor your lab values.  Total Time spent coordinating discharge including counseling, education and face to face time equals 45 minutes.  SignedOren Binet 04/07/2016 8:03 AM

## 2016-04-07 NOTE — Care Management Note (Signed)
Case Management Note  Patient Details  Name: Anthony Hutchinson MRN: [id number] Date of Birth: 1921/10/24  Subjective/Objective:                    Action/Plan:  Confirmed face sheet information  Expected Discharge Date:                  Expected Discharge Plan:  [place]  In-House Referral:     Discharge planning Services  CM Consult  Post Acute Care Choice:  Home Health Choice offered to:  Patient, Adult Children  DME Arranged:    DME Agency:     HH Arranged:  PT [place]:  [place]  Status of Service:  Completed, signed off  If discussed at [place] of Stay Meetings, dates discussed:    Additional Comments:  Marilu Favre, RN 04/07/2016, 9:47 AM

## 2016-04-20 ENCOUNTER — Ambulatory Visit: Payer: Medicare Other

## 2016-04-20 ENCOUNTER — Ambulatory Visit (HOSPITAL_COMMUNITY)
Admission: RE | Admit: 2016-04-20 | Discharge: 2016-04-20 | Disposition: A | Discharge status: Home / Self Care | Payer: Medicare Other | Source: Ambulatory Visit | Attending: Hematology and Oncology | Admitting: Hematology and Oncology

## 2016-04-20 ENCOUNTER — Other Ambulatory Visit (HOSPITAL_BASED_OUTPATIENT_CLINIC_OR_DEPARTMENT_OTHER): Payer: Medicare Other

## 2016-04-20 ENCOUNTER — Ambulatory Visit (HOSPITAL_BASED_OUTPATIENT_CLINIC_OR_DEPARTMENT_OTHER): Payer: Medicare Other

## 2016-04-20 VITALS — BP 130/48 | HR 62 | Temp 97.8°F | Resp 18

## 2016-04-20 DIAGNOSIS — N189 Chronic kidney disease, unspecified: Secondary | ICD-10-CM

## 2016-04-20 DIAGNOSIS — N183 Chronic kidney disease, stage 3 (moderate): Secondary | ICD-10-CM | POA: Diagnosis not present

## 2016-04-20 DIAGNOSIS — D631 Anemia in chronic kidney disease: Secondary | ICD-10-CM

## 2016-04-20 DIAGNOSIS — D469 Myelodysplastic syndrome, unspecified: Secondary | ICD-10-CM

## 2016-04-20 DIAGNOSIS — D630 Anemia in neoplastic disease: Secondary | ICD-10-CM

## 2016-04-20 DIAGNOSIS — Z95828 Presence of other vascular implants and grafts: Secondary | ICD-10-CM

## 2016-04-20 LAB — CBC & DIFF AND RETIC
BASO%: 0.2 % (ref 0.0–2.0)
BASOS ABS: 0 10*3/uL (ref 0.0–0.1)
EOS%: 2.3 % (ref 0.0–7.0)
Eosinophils Absolute: 0.1 10*3/uL (ref 0.0–0.5)
HEMATOCRIT: 23 % — AB (ref 38.4–49.9)
HEMOGLOBIN: 7.4 g/dL — AB (ref 13.0–17.1)
Immature Retic Fract: 9.4 % (ref 3.00–10.60)
LYMPH%: 14.5 % (ref 14.0–49.0)
MCH: 29.5 pg (ref 27.2–33.4)
MCHC: 32.2 g/dL (ref 32.0–36.0)
MCV: 91.6 fL (ref 79.3–98.0)
MONO#: 0.4 10*3/uL (ref 0.1–0.9)
MONO%: 8.2 % (ref 0.0–14.0)
NEUT#: 3.2 10*3/uL (ref 1.5–6.5)
NEUT%: 74.8 % (ref 39.0–75.0)
NRBC: 0 % (ref 0–0)
Platelets: 47 10*3/uL — ABNORMAL LOW (ref 140–400)
RBC: 2.51 10*6/uL — ABNORMAL LOW (ref 4.20–5.82)
RDW: 23.2 % — AB (ref 11.0–14.6)
Retic %: 1.85 % — ABNORMAL HIGH (ref 0.80–1.80)
Retic Ct Abs: 46.44 10*3/uL (ref 34.80–93.90)
WBC: 4.3 10*3/uL (ref 4.0–10.3)
lymph#: 0.6 10*3/uL — ABNORMAL LOW (ref 0.9–3.3)

## 2016-04-20 LAB — TECHNOLOGIST REVIEW

## 2016-04-20 LAB — PREPARE RBC (CROSSMATCH)

## 2016-04-20 MED ORDER — ACETAMINOPHEN 325 MG PO TABS
ORAL_TABLET | ORAL | Status: AC
  Filled 2016-04-20: qty 2

## 2016-04-20 MED ORDER — HEPARIN SOD (PORK) LOCK FLUSH 100 UNIT/ML IV SOLN
500.0000 [IU] | Freq: Every day | INTRAVENOUS | Status: AC | PRN
  Administered 2016-04-20: 500 [IU]
  Filled 2016-04-20: qty 5

## 2016-04-20 MED ORDER — SODIUM CHLORIDE 0.9 % IV SOLN
250.0000 mL | Freq: Once | INTRAVENOUS | Status: AC
  Administered 2016-04-20: 250 mL via INTRAVENOUS

## 2016-04-20 MED ORDER — SODIUM CHLORIDE 0.9% FLUSH
10.0000 mL | INTRAVENOUS | Status: AC | PRN
  Administered 2016-04-20: 10 mL
  Filled 2016-04-20: qty 10

## 2016-04-20 MED ORDER — HEPARIN SOD (PORK) LOCK FLUSH 100 UNIT/ML IV SOLN
500.0000 [IU] | INTRAVENOUS | Status: DC | PRN
  Filled 2016-04-20: qty 5

## 2016-04-20 MED ORDER — ACETAMINOPHEN 325 MG PO TABS
650.0000 mg | ORAL_TABLET | Freq: Once | ORAL | Status: AC
  Administered 2016-04-20: 650 mg via ORAL

## 2016-04-20 MED ORDER — SODIUM CHLORIDE 0.9 % IJ SOLN
10.0000 mL | INTRAMUSCULAR | Status: AC | PRN
Start: 2016-04-20 — End: 2016-04-20
  Administered 2016-04-20: 10 mL
  Filled 2016-04-20: qty 10

## 2016-04-20 MED ORDER — DARBEPOETIN ALFA 500 MCG/ML IJ SOSY
500.0000 ug | PREFILLED_SYRINGE | Freq: Once | INTRAMUSCULAR | Status: AC
  Administered 2016-04-20: 500 ug via SUBCUTANEOUS
  Filled 2016-04-20: qty 1

## 2016-04-20 MED ORDER — DIPHENHYDRAMINE HCL 25 MG PO CAPS
25.0000 mg | ORAL_CAPSULE | Freq: Once | ORAL | Status: AC
  Administered 2016-04-20: 25 mg via ORAL

## 2016-04-20 MED ORDER — DIPHENHYDRAMINE HCL 25 MG PO CAPS
ORAL_CAPSULE | ORAL | Status: AC
  Filled 2016-04-20: qty 1

## 2016-04-20 NOTE — Patient Instructions (Signed)

## 2016-04-20 NOTE — Progress Notes (Signed)
Pt and VS stable at discharge. 

## 2016-04-21 LAB — TYPE AND SCREEN
ABO/RH(D): A POS
Antibody Screen: NEGATIVE
UNIT DIVISION: 0

## 2016-05-04 ENCOUNTER — Ambulatory Visit (HOSPITAL_BASED_OUTPATIENT_CLINIC_OR_DEPARTMENT_OTHER): Payer: Medicare Other

## 2016-05-04 ENCOUNTER — Ambulatory Visit: Payer: Medicare Other

## 2016-05-04 ENCOUNTER — Other Ambulatory Visit (HOSPITAL_BASED_OUTPATIENT_CLINIC_OR_DEPARTMENT_OTHER): Payer: Medicare Other

## 2016-05-04 VITALS — BP 132/58 | HR 65 | Temp 97.8°F | Resp 16

## 2016-05-04 DIAGNOSIS — N189 Chronic kidney disease, unspecified: Secondary | ICD-10-CM

## 2016-05-04 DIAGNOSIS — D631 Anemia in chronic kidney disease: Secondary | ICD-10-CM

## 2016-05-04 DIAGNOSIS — D469 Myelodysplastic syndrome, unspecified: Secondary | ICD-10-CM

## 2016-05-04 DIAGNOSIS — N183 Chronic kidney disease, stage 3 unspecified: Secondary | ICD-10-CM

## 2016-05-04 DIAGNOSIS — Z95828 Presence of other vascular implants and grafts: Secondary | ICD-10-CM

## 2016-05-04 DIAGNOSIS — D630 Anemia in neoplastic disease: Secondary | ICD-10-CM

## 2016-05-04 LAB — CBC & DIFF AND RETIC
BASO%: 0.2 % (ref 0.0–2.0)
BASOS ABS: 0 10*3/uL (ref 0.0–0.1)
EOS%: 2.4 % (ref 0.0–7.0)
Eosinophils Absolute: 0.1 10*3/uL (ref 0.0–0.5)
HEMATOCRIT: 23.6 % — AB (ref 38.4–49.9)
HEMOGLOBIN: 7.5 g/dL — AB (ref 13.0–17.1)
IMMATURE RETIC FRACT: 6.3 % (ref 3.00–10.60)
LYMPH%: 12.1 % — AB (ref 14.0–49.0)
MCH: 29.2 pg (ref 27.2–33.4)
MCHC: 31.8 g/dL — ABNORMAL LOW (ref 32.0–36.0)
MCV: 91.8 fL (ref 79.3–98.0)
MONO#: 0.5 10*3/uL (ref 0.1–0.9)
MONO%: 9.3 % (ref 0.0–14.0)
NEUT#: 3.9 10*3/uL (ref 1.5–6.5)
NEUT%: 76 % — AB (ref 39.0–75.0)
PLATELETS: 42 10*3/uL — AB (ref 140–400)
RBC: 2.57 10*6/uL — ABNORMAL LOW (ref 4.20–5.82)
RDW: 23.5 % — ABNORMAL HIGH (ref 11.0–14.6)
RETIC CT ABS: 38.04 10*3/uL (ref 34.80–93.90)
Retic %: 1.48 % (ref 0.80–1.80)
WBC: 5.1 10*3/uL (ref 4.0–10.3)
lymph#: 0.6 10*3/uL — ABNORMAL LOW (ref 0.9–3.3)

## 2016-05-04 LAB — TECHNOLOGIST REVIEW

## 2016-05-04 LAB — PREPARE RBC (CROSSMATCH)

## 2016-05-04 MED ORDER — SODIUM CHLORIDE 0.9% FLUSH
10.0000 mL | INTRAVENOUS | Status: AC | PRN
  Administered 2016-05-04: 10 mL
  Filled 2016-05-04: qty 10

## 2016-05-04 MED ORDER — DIPHENHYDRAMINE HCL 25 MG PO CAPS
ORAL_CAPSULE | ORAL | Status: AC
  Filled 2016-05-04: qty 1

## 2016-05-04 MED ORDER — HEPARIN SOD (PORK) LOCK FLUSH 100 UNIT/ML IV SOLN
250.0000 [IU] | INTRAVENOUS | Status: DC | PRN
Start: 2016-05-04 — End: 2016-05-04
  Filled 2016-05-04: qty 5

## 2016-05-04 MED ORDER — DIPHENHYDRAMINE HCL 25 MG PO CAPS
25.0000 mg | ORAL_CAPSULE | Freq: Once | ORAL | Status: AC
Start: 2016-05-04 — End: 2016-05-04
  Administered 2016-05-04: 25 mg via ORAL

## 2016-05-04 MED ORDER — ACETAMINOPHEN 325 MG PO TABS
650.0000 mg | ORAL_TABLET | Freq: Once | ORAL | Status: AC
Start: 2016-05-04 — End: 2016-05-04
  Administered 2016-05-04: 650 mg via ORAL

## 2016-05-04 MED ORDER — SODIUM CHLORIDE 0.9% FLUSH
3.0000 mL | INTRAVENOUS | Status: DC | PRN
  Filled 2016-05-04: qty 10

## 2016-05-04 MED ORDER — HEPARIN SOD (PORK) LOCK FLUSH 100 UNIT/ML IV SOLN
250.0000 [IU] | INTRAVENOUS | Status: DC | PRN
  Filled 2016-05-04: qty 5

## 2016-05-04 MED ORDER — DARBEPOETIN ALFA 500 MCG/ML IJ SOSY
500.0000 ug | PREFILLED_SYRINGE | Freq: Once | INTRAMUSCULAR | Status: AC
  Administered 2016-05-04: 500 ug via SUBCUTANEOUS
  Filled 2016-05-04: qty 1

## 2016-05-04 MED ORDER — SODIUM CHLORIDE 0.9 % IJ SOLN
10.0000 mL | INTRAMUSCULAR | Status: DC | PRN
  Administered 2016-05-04: 10 mL via INTRAVENOUS
  Filled 2016-05-04: qty 10

## 2016-05-04 MED ORDER — SODIUM CHLORIDE 0.9 % IV SOLN
250.0000 mL | Freq: Once | INTRAVENOUS | Status: AC
  Administered 2016-05-04: 250 mL via INTRAVENOUS

## 2016-05-04 MED ORDER — HEPARIN SOD (PORK) LOCK FLUSH 100 UNIT/ML IV SOLN
500.0000 [IU] | Freq: Every day | INTRAVENOUS | Status: AC | PRN
  Administered 2016-05-04: 500 [IU]
  Filled 2016-05-04: qty 5

## 2016-05-04 MED ORDER — ACETAMINOPHEN 325 MG PO TABS
ORAL_TABLET | ORAL | Status: AC
  Filled 2016-05-04: qty 2

## 2016-05-04 NOTE — Addendum Note (Signed)
Addended by: Ricarda Frame C on: 05/04/2016 10:16 AM   Modules accepted: Orders, SmartSet

## 2016-05-05 LAB — TYPE AND SCREEN
ABO/RH(D): A POS
Antibody Screen: NEGATIVE
Unit division: 0

## 2016-05-17 ENCOUNTER — Other Ambulatory Visit: Payer: Self-pay | Admitting: Hematology and Oncology

## 2016-05-17 ENCOUNTER — Ambulatory Visit (HOSPITAL_COMMUNITY)
Admission: RE | Admit: 2016-05-17 | Discharge: 2016-05-17 | Disposition: A | Discharge status: Home / Self Care | Payer: Medicare Other | Source: Ambulatory Visit | Attending: Hematology and Oncology | Admitting: Hematology and Oncology

## 2016-05-17 DIAGNOSIS — D469 Myelodysplastic syndrome, unspecified: Secondary | ICD-10-CM | POA: Insufficient documentation

## 2016-05-18 ENCOUNTER — Other Ambulatory Visit (HOSPITAL_BASED_OUTPATIENT_CLINIC_OR_DEPARTMENT_OTHER): Payer: Medicare Other

## 2016-05-18 ENCOUNTER — Ambulatory Visit (HOSPITAL_BASED_OUTPATIENT_CLINIC_OR_DEPARTMENT_OTHER): Payer: Medicare Other | Admitting: Hematology and Oncology

## 2016-05-18 ENCOUNTER — Ambulatory Visit (HOSPITAL_BASED_OUTPATIENT_CLINIC_OR_DEPARTMENT_OTHER): Payer: Medicare Other

## 2016-05-18 ENCOUNTER — Encounter: Payer: Self-pay | Admitting: Hematology and Oncology

## 2016-05-18 ENCOUNTER — Ambulatory Visit: Payer: Medicare Other

## 2016-05-18 ENCOUNTER — Telehealth: Payer: Self-pay | Admitting: *Deleted

## 2016-05-18 ENCOUNTER — Telehealth: Payer: Self-pay | Admitting: Hematology and Oncology

## 2016-05-18 VITALS — BP 143/55 | HR 74 | Temp 97.8°F | Resp 16

## 2016-05-18 VITALS — BP 116/44 | HR 88 | Temp 97.7°F | Resp 18 | Ht 65.0 in | Wt 160.9 lb

## 2016-05-18 DIAGNOSIS — R300 Dysuria: Secondary | ICD-10-CM | POA: Diagnosis not present

## 2016-05-18 DIAGNOSIS — D631 Anemia in chronic kidney disease: Secondary | ICD-10-CM

## 2016-05-18 DIAGNOSIS — D469 Myelodysplastic syndrome, unspecified: Secondary | ICD-10-CM | POA: Diagnosis not present

## 2016-05-18 DIAGNOSIS — R059 Cough, unspecified: Secondary | ICD-10-CM

## 2016-05-18 DIAGNOSIS — N183 Chronic kidney disease, stage 3 unspecified: Secondary | ICD-10-CM

## 2016-05-18 DIAGNOSIS — D696 Thrombocytopenia, unspecified: Secondary | ICD-10-CM

## 2016-05-18 DIAGNOSIS — R05 Cough: Secondary | ICD-10-CM

## 2016-05-18 DIAGNOSIS — R4182 Altered mental status, unspecified: Secondary | ICD-10-CM

## 2016-05-18 DIAGNOSIS — N189 Chronic kidney disease, unspecified: Secondary | ICD-10-CM

## 2016-05-18 DIAGNOSIS — D630 Anemia in neoplastic disease: Secondary | ICD-10-CM

## 2016-05-18 DIAGNOSIS — Z95828 Presence of other vascular implants and grafts: Secondary | ICD-10-CM

## 2016-05-18 DIAGNOSIS — R404 Transient alteration of awareness: Secondary | ICD-10-CM

## 2016-05-18 LAB — CBC & DIFF AND RETIC
BASO%: 0.1 % (ref 0.0–2.0)
BASOS ABS: 0 10*3/uL (ref 0.0–0.1)
EOS ABS: 0.1 10*3/uL (ref 0.0–0.5)
EOS%: 0.9 % (ref 0.0–7.0)
HCT: 24.3 % — ABNORMAL LOW (ref 38.4–49.9)
HEMOGLOBIN: 7.7 g/dL — AB (ref 13.0–17.1)
IMMATURE RETIC FRACT: 10.9 % — AB (ref 3.00–10.60)
LYMPH%: 11.2 % — AB (ref 14.0–49.0)
MCH: 29.1 pg (ref 27.2–33.4)
MCHC: 31.7 g/dL — ABNORMAL LOW (ref 32.0–36.0)
MCV: 91.7 fL (ref 79.3–98.0)
MONO#: 0.5 10*3/uL (ref 0.1–0.9)
MONO%: 6.9 % (ref 0.0–14.0)
NEUT#: 5.4 10*3/uL (ref 1.5–6.5)
NEUT%: 80.9 % — ABNORMAL HIGH (ref 39.0–75.0)
PLATELETS: 56 10*3/uL — AB (ref 140–400)
RBC: 2.65 10*6/uL — ABNORMAL LOW (ref 4.20–5.82)
RDW: 22.5 % — ABNORMAL HIGH (ref 11.0–14.6)
Retic %: 2.43 % — ABNORMAL HIGH (ref 0.80–1.80)
Retic Ct Abs: 64.4 10*3/uL (ref 34.80–93.90)
WBC: 6.7 10*3/uL (ref 4.0–10.3)
lymph#: 0.8 10*3/uL — ABNORMAL LOW (ref 0.9–3.3)
nRBC: 0 % (ref 0–0)

## 2016-05-18 LAB — URINALYSIS, MICROSCOPIC - CHCC
BILIRUBIN (URINE): NEGATIVE
BLOOD: NEGATIVE
GLUCOSE UR CHCC: NEGATIVE mg/dL
Ketones: NEGATIVE mg/dL
LEUKOCYTE ESTERASE: NEGATIVE
NITRITE: NEGATIVE
PH: 6 (ref 4.6–8.0)
PROTEIN: NEGATIVE mg/dL
RBC / HPF: NEGATIVE (ref 0–2)
Specific Gravity, Urine: 1.01 (ref 1.003–1.035)
Urobilinogen, UR: 0.2 mg/dL (ref 0.2–1)

## 2016-05-18 LAB — PREPARE RBC (CROSSMATCH)

## 2016-05-18 LAB — TECHNOLOGIST REVIEW

## 2016-05-18 MED ORDER — ACETAMINOPHEN 325 MG PO TABS
650.0000 mg | ORAL_TABLET | Freq: Once | ORAL | Status: AC
  Administered 2016-05-18: 650 mg via ORAL

## 2016-05-18 MED ORDER — SODIUM CHLORIDE 0.9 % IJ SOLN
10.0000 mL | INTRAMUSCULAR | Status: DC | PRN
  Administered 2016-05-18: 10 mL via INTRAVENOUS
  Filled 2016-05-18: qty 10

## 2016-05-18 MED ORDER — DIPHENHYDRAMINE HCL 12.5 MG/5ML PO ELIX
12.5000 mg | ORAL_SOLUTION | Freq: Once | ORAL | Status: AC
  Administered 2016-05-18: 12.5 mg via ORAL
  Filled 2016-05-18: qty 5

## 2016-05-18 MED ORDER — ACETAMINOPHEN 325 MG PO TABS
ORAL_TABLET | ORAL | Status: AC
  Filled 2016-05-18: qty 2

## 2016-05-18 MED ORDER — DARBEPOETIN ALFA 500 MCG/ML IJ SOSY
500.0000 ug | PREFILLED_SYRINGE | Freq: Once | INTRAMUSCULAR | Status: AC
  Administered 2016-05-18: 500 ug via SUBCUTANEOUS
  Filled 2016-05-18: qty 1

## 2016-05-18 MED ORDER — HEPARIN SOD (PORK) LOCK FLUSH 100 UNIT/ML IV SOLN
500.0000 [IU] | Freq: Every day | INTRAVENOUS | Status: AC | PRN
Start: 2016-05-18 — End: 2016-05-18
  Administered 2016-05-18: 500 [IU]
  Filled 2016-05-18: qty 5

## 2016-05-18 MED ORDER — DIPHENHYDRAMINE HCL 25 MG PO CAPS: 12.5000 mg | ORAL_CAPSULE | Freq: Once | ORAL | Status: DC

## 2016-05-18 MED ORDER — SODIUM CHLORIDE 0.9% FLUSH
10.0000 mL | INTRAVENOUS | Status: AC | PRN
  Administered 2016-05-18: 10 mL
  Filled 2016-05-18: qty 10

## 2016-05-18 MED ORDER — SODIUM CHLORIDE 0.9 % IV SOLN
250.0000 mL | Freq: Once | INTRAVENOUS | Status: AC
  Administered 2016-05-18: 250 mL via INTRAVENOUS

## 2016-05-18 NOTE — Assessment & Plan Note (Signed)
His son is concerned about recent confusion episode. Today, he appears fully oriented His son is concerned he may have a recurrent urinary tract infection I will proceed to order urinalysis and urine culture Preliminary results from urinalysis is not compatible with recurrent UTI Recommend observation only at this point -Perhaps the altered mental status could be related to anemia and recent hypotension

## 2016-05-18 NOTE — Patient Instructions (Signed)

## 2016-05-18 NOTE — Telephone Encounter (Signed)
Per LOS I have scheduled appts and notified the scheduler 

## 2016-05-18 NOTE — Assessment & Plan Note (Signed)
Given his multiple comorbidities, he may have an element of anemia chronic disease, likely myelodysplastic syndrome with intermittent bleeding He has responded very well to Aranesp until recently which I suspect the drop of hemoglobin and platelet was due to consumption I plan to continue his injection at 500 g every 2 weeks to keep hemoglobin above 11 g. The patient can also received blood transfusion as needed for symptomatic anemia. We discussed limitation of other treatment at his age and he is comfortable to continue conservative management with ESA and transfusion as needed We discussed some of the risks, benefits, and alternatives of blood transfusions. The patient is symptomatic from anemia and the hemoglobin level is critically low.  Some of the side-effects to be expected including risks of transfusion reactions, chills, infection, syndrome of volume overload and risk of hospitalization from various reasons and the patient is willing to proceed and went ahead to sign consent today.

## 2016-05-18 NOTE — Progress Notes (Signed)
[place] OFFICE PROGRESS NOTE  Patient Care Team: Merrilee Seashore, MD as PCP - General (Internal Medicine) Heath Lark, MD as Consulting Physician (Hematology and Oncology)  SUMMARY OF ONCOLOGIC HISTORY: Oncology History   MDS, low IPSS score     MDS (myelodysplastic syndrome) ([place])   12/09/2004 Bone Marrow Biopsy    Bone marrow biopsy showed HYPERCELLULAR MARROW WITH DYSPOIETIC FEATURES AND RING SIDEROBLASTS,  FAVOR MYELODYSPLASTIC SYNDROME      05/26/2011 - 01/01/2014 Chemotherapy    The patient was given Procrit every 3 weeks 40,000 units for anemia.      01/22/2014 -  Chemotherapy    He is placed on Aranesp 300 mcg      03/26/2014 -  Chemotherapy    Aranesp is increased to 500 mcg       INTERVAL HISTORY: Please see below for problem oriented charting. He returns for follow-up. He had recent episode of confusion and urinary tract infection Since he completed his course of antibiotic therapy, his son noted transient altered mental status recently. He also have one episode of fall related to tripping over objects without presyncopal episode. He did not have major injuries He continued to have occasional rectal bleeding He has occasional cough. Denies recent sore throat, fever or chills. Denies dysuria, frequency or urgency  REVIEW OF SYSTEMS:   Constitutional: Denies fevers, chills or abnormal weight loss Eyes: Denies blurriness of vision Ears, nose, mouth, throat, and face: Denies mucositis or sore throat Cardiovascular: Denies palpitation, chest discomfort or lower extremity swelling Gastrointestinal:  Denies nausea, heartburn or change in bowel habits Skin: Denies abnormal skin rashes Lymphatics: Denies new lymphadenopathy  Neurological:Denies numbness, tingling or new weaknesses Behavioral/Psych: Mood is stable, no new changes  All other systems were reviewed with the patient and are negative.  I have reviewed the past medical history, past  surgical history, social history and family history with the patient and they are unchanged from previous note.  ALLERGIES:  is allergic to cephalosporins; codeine sulfate; and penicillins.  MEDICATIONS:  Current Outpatient Prescriptions  Medication Sig Dispense Refill  . acetaminophen (TYLENOL) 500 MG tablet Take 500 mg by mouth every 6 (six) hours as needed (pain).    Marland Kitchen alfuzosin (UROXATRAL) 10 MG 24 hr tablet Take 10 mg by mouth every evening.     Marland Kitchen aminocaproic acid (AMICAR) 500 MG tablet Take 1 tablet (500 mg total) by mouth every 8 (eight) hours as needed for bleeding. 30 tablet 9  . calcium carbonate (TUMS - DOSED IN MG ELEMENTAL CALCIUM) 500 MG chewable tablet Chew 1 tablet by mouth daily as needed for indigestion or heartburn.    . furosemide (LASIX) 20 MG tablet Take 1 tablet (20 mg total) by mouth daily. 90 tablet 3  . hydrocortisone (ANUSOL-HC) 25 MG suppository Place 1 suppository (25 mg total) rectally 2 (two) times daily as needed for hemorrhoids or itching. 12 suppository 0  . levofloxacin (LEVAQUIN) 500 MG tablet Take 1 tablet (500 mg total) by mouth every other day. 3 tablet 0  . lidocaine-prilocaine (EMLA) cream Apply 1 application topically as needed. Apply to port 1 hour prior to access. 30 g 0  . loperamide (IMODIUM) 2 MG capsule Take 2 mg by mouth daily as needed for diarrhea or loose stools.    . Multiple Vitamin (MULTIVITAMIN WITH MINERALS) TABS Take 1 tablet by mouth every morning.    Marland Kitchen SYNTHROID 125 MCG tablet Take 125 mcg by mouth daily before breakfast.     .  TOPROL XL 25 MG 24 hr tablet Take 25 mg by mouth daily.     No current facility-administered medications for this visit.    Facility-Administered Medications Ordered in Other Visits  Medication Dose Route Frequency Provider Last Rate Last Dose  . 0.9 %  sodium chloride infusion  250 mL Intravenous Once Heath Lark, MD      . Darbepoetin Alfa (ARANESP) injection 500 mcg  500 mcg Subcutaneous Once Heath Lark,  MD      . heparin lock flush 100 unit/mL  500 Units Intracatheter Daily PRN Heath Lark, MD      . sodium chloride flush (NS) 0.9 % injection 10 mL  10 mL Intracatheter PRN Heath Lark, MD        PHYSICAL EXAMINATION: ECOG PERFORMANCE STATUS: 2 - Symptomatic, <50% confined to bed  Vitals:   05/18/16 1003  BP: (!) 116/44  Pulse: 88  Resp: 18  Temp: 97.7 F (36.5 C)   Filed Weights   05/18/16 1003  Weight: 160 lb 14.4 oz (73 kg)    GENERAL:alert, no distress and comfortable. He looks thin, poor personal hygiene SKIN: He looks pale. Noticed skin bruises EYES: normal, Conjunctiva are pink and non-injected, sclera clear OROPHARYNX:no exudate, no erythema and lips, buccal mucosa, and tongue normal . Poor oral dentition NECK: supple, thyroid normal size, non-tender, without nodularity LYMPH:  no palpable lymphadenopathy in the cervical, axillary or inguinal LUNGS: Normal breathing effort. Reduced breath sounds on the right lower base HEART: regular rate & rhythm and no murmurs and no lower extremity edema ABDOMEN:abdomen soft, non-tender and normal bowel sounds Musculoskeletal:no cyanosis of digits and no clubbing  NEURO: alert & oriented x 3 with fluent speech, no focal motor/sensory deficits  LABORATORY DATA:  I have reviewed the data as listed    Component Value Date/Time   NA 136 04/05/2016 0525   NA 138 01/05/2014 1159   K 4.5 04/05/2016 0525   K 4.6 01/05/2014 1159   CL 107 04/05/2016 0525   CO2 18 (L) 04/05/2016 0525   CO2 25 01/05/2014 1159   GLUCOSE 134 (H) 04/05/2016 0525   GLUCOSE 94 01/05/2014 1159   BUN 33 (H) 04/05/2016 0525   BUN 25.0 01/05/2014 1159   CREATININE 2.09 (H) 04/05/2016 0525   CREATININE 2.22 (H) 12/21/2015 1110   CREATININE 1.7 (H) 01/05/2014 1159   CALCIUM 8.0 (L) 04/05/2016 0525   CALCIUM 9.3 01/05/2014 1159   PROT 5.7 (L) 04/05/2016 0525   PROT 7.1 01/05/2014 1159   ALBUMIN 2.4 (L) 04/05/2016 0525   ALBUMIN 3.7 01/05/2014 1159   AST 44  (H) 04/05/2016 0525   AST 64 (H) 01/05/2014 1159   ALT 28 04/05/2016 0525   ALT 70 (H) 01/05/2014 1159   ALKPHOS 150 (H) 04/05/2016 0525   ALKPHOS 254 (H) 01/05/2014 1159   BILITOT 1.5 (H) 04/05/2016 0525   BILITOT 0.91 01/05/2014 1159   GFRNONAA 26 (L) 04/05/2016 0525   GFRAA 30 (L) 04/05/2016 0525    No results found for: SPEP, UPEP  Lab Results  Component Value Date   WBC 6.7 05/18/2016   NEUTROABS 5.4 05/18/2016   HGB 7.7 (L) 05/18/2016   HCT 24.3 (L) 05/18/2016   MCV 91.7 05/18/2016   PLT 56 (L) 05/18/2016      Chemistry      Component Value Date/Time   NA 136 04/05/2016 0525   NA 138 01/05/2014 1159   K 4.5 04/05/2016 0525   K 4.6 01/05/2014 1159  CL 107 04/05/2016 0525   CO2 18 (L) 04/05/2016 0525   CO2 25 01/05/2014 1159   BUN 33 (H) 04/05/2016 0525   BUN 25.0 01/05/2014 1159   CREATININE 2.09 (H) 04/05/2016 0525   CREATININE 2.22 (H) 12/21/2015 1110   CREATININE 1.7 (H) 01/05/2014 1159      Component Value Date/Time   CALCIUM 8.0 (L) 04/05/2016 0525   CALCIUM 9.3 01/05/2014 1159   ALKPHOS 150 (H) 04/05/2016 0525   ALKPHOS 254 (H) 01/05/2014 1159   AST 44 (H) 04/05/2016 0525   AST 64 (H) 01/05/2014 1159   ALT 28 04/05/2016 0525   ALT 70 (H) 01/05/2014 1159   BILITOT 1.5 (H) 04/05/2016 0525   BILITOT 0.91 01/05/2014 1159     ASSESSMENT & PLAN:  MDS (myelodysplastic syndrome) ([facility]) Given his multiple comorbidities, he may have an element of anemia chronic disease, likely myelodysplastic syndrome with intermittent bleeding He has responded very well to Aranesp until recently which I suspect the drop of hemoglobin and platelet was due to consumption I plan to continue his injection at 500 g every 2 weeks to keep hemoglobin above 11 g. The patient can also received blood transfusion as needed for symptomatic anemia. We discussed limitation of other treatment at his age and he is comfortable to continue conservative management with ESA and transfusion as  needed We discussed some of the risks, benefits, and alternatives of blood transfusions. The patient is symptomatic from anemia and the hemoglobin level is critically low.  Some of the side-effects to be expected including risks of transfusion reactions, chills, infection, syndrome of volume overload and risk of hospitalization from various reasons and the patient is willing to proceed and went ahead to sign consent today.   Thrombocytopenia ([place]) The most likely cause of this is due to consumption from bleeding and CT scan April 2016 showed liver cirrhosis and splenomegaly Low-grade myelodysplastic syndrome can also cause thrombocytopenia. The present time, he can be observed as long as platelet count is greater than 50,000. For now, we will observe and he does not need platelet transfusion today  Altered mental state His son is concerned about recent confusion episode. Today, he appears fully oriented His son is concerned he may have a recurrent urinary tract infection I will proceed to order urinalysis and urine culture Preliminary results from urinalysis is not compatible with recurrent UTI Recommend observation only at this point -Perhaps the altered mental status could be related to anemia and recent hypotension  Cough Could be related to recent congestive heart failure Examination revealed poor breath sounds over the right lung base but clinically, he does not appear to have signs of infection His vital signs are stable Continue close observation and medical management   Orders Placed This Encounter  Procedures  . Urine culture    Standing Status:   Future    Number of Occurrences:   1    Standing Expiration Date:   06/22/2017  . Urinalysis, Microscopic - [facility]    Standing Status:   Future    Number of Occurrences:   1    Standing Expiration Date:   06/22/2017   All questions were answered. The patient knows to call the clinic with any problems, questions or concerns. No  barriers to learning was detected. I spent 20 minutes counseling the patient face to face. The total time spent in the appointment was 25 minutes and more than 50% was on counseling and review of test results  Heath Lark, MD 05/18/2016 2:26 PM

## 2016-05-18 NOTE — Assessment & Plan Note (Signed)
Could be related to recent congestive heart failure Examination revealed poor breath sounds over the right lung base but clinically, he does not appear to have signs of infection His vital signs are stable Continue close observation and medical management

## 2016-05-18 NOTE — Telephone Encounter (Signed)
Appointments scheduled per 11/9 LOS. Patient given AVS report and calendars with future scheduled appointments. °

## 2016-05-18 NOTE — Assessment & Plan Note (Signed)
The most likely cause of this is due to consumption from bleeding and CT scan April 2016 showed liver cirrhosis and splenomegaly Low-grade myelodysplastic syndrome can also cause thrombocytopenia. The present time, he can be observed as long as platelet count is greater than 50,000. For now, we will observe and he does not need platelet transfusion today

## 2016-05-19 LAB — TYPE AND SCREEN
ABO/RH(D): A POS
Antibody Screen: NEGATIVE
UNIT DIVISION: 0

## 2016-05-19 LAB — URINE CULTURE

## 2016-05-22 ENCOUNTER — Telehealth: Payer: Self-pay | Admitting: *Deleted

## 2016-05-22 NOTE — Telephone Encounter (Signed)
Pt's son notified of urine culture negative.  He says pt still having confusion, anxiety and some paranoia.  His PCP started him on Xanax prn, but the xanax makes him weaker.  Son says a family member suggested Hospice Referral which he is interested in getting some more help/support at home as pt's condition is declining.  Son says he thinks pt still wants to have transfusions and injections for now.  He is not quite ready to stop the transfusions yet.  Informed son Dr. Alvy Bimler agrees to Hospice Referral.  We can make referral to [facility].  They cover Transfusions.   Son agreed to [facility] referral and Home visit for Hospice to explain their services.   Will keep appts here at [facility] as scheduled.  Not all appts are scheduled as Dr. Alvy Bimler requested them so I sent a new request to Scheduling to correct pt's schedule.  Son verbalized understanding.  [facility] to request referral. [facility] Sousa called back requests Physicians Order, Insurance information, Recent office note be faxed to them at [phone].  Will fax information/ referral to them in the morning.

## 2016-05-22 NOTE — Telephone Encounter (Signed)
-----   Message from Heath Lark, MD sent at 05/20/2016 12:21 PM EST ----- Please let his son know Urine culture is neg ----- Message ----- From: Interface, Lab In Three Zero One Sent: 05/18/2016  10:08 AM To: Heath Lark, MD

## 2016-05-24 ENCOUNTER — Telehealth: Payer: Self-pay

## 2016-05-24 NOTE — Telephone Encounter (Signed)
Anthony Hutchinson with [facility] visited pt and family. They want to think about it for a day or so. Pt wants to continue blood transfusions. Andee Poles will be contacting them in a few days to follow up.

## 2016-05-28 ENCOUNTER — Other Ambulatory Visit: Payer: Self-pay

## 2016-05-28 ENCOUNTER — Emergency Department (HOSPITAL_COMMUNITY): Payer: Medicare Other

## 2016-05-28 ENCOUNTER — Encounter (HOSPITAL_COMMUNITY): Payer: Self-pay | Admitting: *Deleted

## 2016-05-28 ENCOUNTER — Inpatient Hospital Stay (HOSPITAL_COMMUNITY)
Admission: EM | Admit: 2016-05-28 | Discharge: 2016-05-30 | DRG: 177 | Disposition: A | Discharge status: Hospice | Payer: Medicare Other | Attending: Internal Medicine | Admitting: Internal Medicine

## 2016-05-28 DIAGNOSIS — D631 Anemia in chronic kidney disease: Secondary | ICD-10-CM

## 2016-05-28 DIAGNOSIS — I5022 Chronic systolic (congestive) heart failure: Secondary | ICD-10-CM

## 2016-05-28 DIAGNOSIS — Z8546 Personal history of malignant neoplasm of prostate: Secondary | ICD-10-CM

## 2016-05-28 DIAGNOSIS — D696 Thrombocytopenia, unspecified: Secondary | ICD-10-CM | POA: Diagnosis present

## 2016-05-28 DIAGNOSIS — N184 Chronic kidney disease, stage 4 (severe): Secondary | ICD-10-CM | POA: Diagnosis present

## 2016-05-28 DIAGNOSIS — E1151 Type 2 diabetes mellitus with diabetic peripheral angiopathy without gangrene: Secondary | ICD-10-CM | POA: Diagnosis present

## 2016-05-28 DIAGNOSIS — R1312 Dysphagia, oropharyngeal phase: Secondary | ICD-10-CM | POA: Diagnosis present

## 2016-05-28 DIAGNOSIS — L8990 Pressure ulcer of unspecified site, unspecified stage: Secondary | ICD-10-CM | POA: Insufficient documentation

## 2016-05-28 DIAGNOSIS — R05 Cough: Secondary | ICD-10-CM | POA: Diagnosis not present

## 2016-05-28 DIAGNOSIS — I130 Hypertensive heart and chronic kidney disease with heart failure and stage 1 through stage 4 chronic kidney disease, or unspecified chronic kidney disease: Secondary | ICD-10-CM | POA: Diagnosis present

## 2016-05-28 DIAGNOSIS — R404 Transient alteration of awareness: Secondary | ICD-10-CM

## 2016-05-28 DIAGNOSIS — E1122 Type 2 diabetes mellitus with diabetic chronic kidney disease: Secondary | ICD-10-CM | POA: Diagnosis present

## 2016-05-28 DIAGNOSIS — Z6822 Body mass index (BMI) 22.0-22.9, adult: Secondary | ICD-10-CM

## 2016-05-28 DIAGNOSIS — R059 Cough, unspecified: Secondary | ICD-10-CM

## 2016-05-28 DIAGNOSIS — J690 Pneumonitis due to inhalation of food and vomit: Principal | ICD-10-CM | POA: Diagnosis present

## 2016-05-28 DIAGNOSIS — Z923 Personal history of irradiation: Secondary | ICD-10-CM

## 2016-05-28 DIAGNOSIS — Z7189 Other specified counseling: Secondary | ICD-10-CM

## 2016-05-28 DIAGNOSIS — N189 Chronic kidney disease, unspecified: Secondary | ICD-10-CM

## 2016-05-28 DIAGNOSIS — Z8249 Family history of ischemic heart disease and other diseases of the circulatory system: Secondary | ICD-10-CM

## 2016-05-28 DIAGNOSIS — J189 Pneumonia, unspecified organism: Secondary | ICD-10-CM

## 2016-05-28 DIAGNOSIS — I5023 Acute on chronic systolic (congestive) heart failure: Secondary | ICD-10-CM | POA: Diagnosis present

## 2016-05-28 DIAGNOSIS — R410 Disorientation, unspecified: Secondary | ICD-10-CM | POA: Diagnosis present

## 2016-05-28 DIAGNOSIS — R1310 Dysphagia, unspecified: Secondary | ICD-10-CM

## 2016-05-28 DIAGNOSIS — N183 Chronic kidney disease, stage 3 unspecified: Secondary | ICD-10-CM | POA: Diagnosis present

## 2016-05-28 DIAGNOSIS — R0902 Hypoxemia: Secondary | ICD-10-CM

## 2016-05-28 DIAGNOSIS — N390 Urinary tract infection, site not specified: Secondary | ICD-10-CM | POA: Diagnosis present

## 2016-05-28 DIAGNOSIS — D469 Myelodysplastic syndrome, unspecified: Secondary | ICD-10-CM | POA: Diagnosis present

## 2016-05-28 DIAGNOSIS — E1142 Type 2 diabetes mellitus with diabetic polyneuropathy: Secondary | ICD-10-CM | POA: Diagnosis present

## 2016-05-28 DIAGNOSIS — Z87440 Personal history of urinary (tract) infections: Secondary | ICD-10-CM

## 2016-05-28 DIAGNOSIS — Z515 Encounter for palliative care: Secondary | ICD-10-CM | POA: Diagnosis not present

## 2016-05-28 DIAGNOSIS — E038 Other specified hypothyroidism: Secondary | ICD-10-CM

## 2016-05-28 DIAGNOSIS — Z950 Presence of cardiac pacemaker: Secondary | ICD-10-CM

## 2016-05-28 DIAGNOSIS — R627 Adult failure to thrive: Secondary | ICD-10-CM | POA: Diagnosis present

## 2016-05-28 DIAGNOSIS — Z881 Allergy status to other antibiotic agents status: Secondary | ICD-10-CM

## 2016-05-28 DIAGNOSIS — Z87891 Personal history of nicotine dependence: Secondary | ICD-10-CM

## 2016-05-28 DIAGNOSIS — R4182 Altered mental status, unspecified: Secondary | ICD-10-CM | POA: Diagnosis present

## 2016-05-28 DIAGNOSIS — Z885 Allergy status to narcotic agent status: Secondary | ICD-10-CM

## 2016-05-28 DIAGNOSIS — E039 Hypothyroidism, unspecified: Secondary | ICD-10-CM | POA: Diagnosis present

## 2016-05-28 DIAGNOSIS — L89152 Pressure ulcer of sacral region, stage 2: Secondary | ICD-10-CM | POA: Diagnosis present

## 2016-05-28 DIAGNOSIS — Z880 Allergy status to penicillin: Secondary | ICD-10-CM

## 2016-05-28 DIAGNOSIS — Z66 Do not resuscitate: Secondary | ICD-10-CM | POA: Diagnosis present

## 2016-05-28 LAB — URINALYSIS, ROUTINE W REFLEX MICROSCOPIC
Bilirubin Urine: NEGATIVE
Glucose, UA: NEGATIVE mg/dL
Ketones, ur: NEGATIVE mg/dL
Nitrite: NEGATIVE
Protein, ur: 30 mg/dL — AB
Specific Gravity, Urine: 1.021 (ref 1.005–1.030)
pH: 5.5 (ref 5.0–8.0)

## 2016-05-28 LAB — STREP PNEUMONIAE URINARY ANTIGEN: Strep Pneumo Urinary Antigen: NEGATIVE

## 2016-05-28 LAB — CBC WITH DIFFERENTIAL/PLATELET
Basophils Absolute: 0 10*3/uL (ref 0.0–0.1)
Basophils Relative: 0 %
Eosinophils Absolute: 0.1 10*3/uL (ref 0.0–0.7)
Eosinophils Relative: 1 %
HCT: 27.2 % — ABNORMAL LOW (ref 39.0–52.0)
Hemoglobin: 8.8 g/dL — ABNORMAL LOW (ref 13.0–17.0)
Lymphocytes Relative: 10 %
Lymphs Abs: 1.1 10*3/uL (ref 0.7–4.0)
MCH: 29.6 pg (ref 26.0–34.0)
MCHC: 32.4 g/dL (ref 30.0–36.0)
MCV: 91.6 fL (ref 78.0–100.0)
Monocytes Absolute: 0.8 10*3/uL (ref 0.1–1.0)
Monocytes Relative: 7 %
Neutro Abs: 9.1 10*3/uL — ABNORMAL HIGH (ref 1.7–7.7)
Neutrophils Relative %: 82 %
Platelets: 110 10*3/uL — ABNORMAL LOW (ref 150–400)
RBC: 2.97 MIL/uL — ABNORMAL LOW (ref 4.22–5.81)
RDW: 21.5 % — ABNORMAL HIGH (ref 11.5–15.5)
WBC: 11.1 10*3/uL — ABNORMAL HIGH (ref 4.0–10.5)

## 2016-05-28 LAB — BRAIN NATRIURETIC PEPTIDE: B NATRIURETIC PEPTIDE 5: 145.9 pg/mL — AB (ref 0.0–100.0)

## 2016-05-28 LAB — BASIC METABOLIC PANEL
Anion gap: 7 (ref 5–15)
BUN: 33 mg/dL — ABNORMAL HIGH (ref 6–20)
CO2: 26 mmol/L (ref 22–32)
Calcium: 8.4 mg/dL — ABNORMAL LOW (ref 8.9–10.3)
Chloride: 102 mmol/L (ref 101–111)
Creatinine, Ser: 2.08 mg/dL — ABNORMAL HIGH (ref 0.61–1.24)
GFR calc Af Amer: 30 mL/min — ABNORMAL LOW (ref >60)
GFR calc non Af Amer: 26 mL/min — ABNORMAL LOW (ref >60)
Glucose, Bld: 118 mg/dL — ABNORMAL HIGH (ref 65–99)
Potassium: 4.3 mmol/L (ref 3.5–5.1)
Sodium: 135 mmol/L (ref 135–145)

## 2016-05-28 LAB — URINE MICROSCOPIC-ADD ON

## 2016-05-28 MED ORDER — METRONIDAZOLE IN NACL 5-0.79 MG/ML-% IV SOLN
500.0000 mg | Freq: Three times a day (TID) | INTRAVENOUS | Status: DC
  Administered 2016-05-28 – 2016-05-29 (×3): 500 mg via INTRAVENOUS
  Filled 2016-05-28 (×4): qty 100

## 2016-05-28 MED ORDER — ACETAMINOPHEN 500 MG PO TABS
500.0000 mg | ORAL_TABLET | Freq: Four times a day (QID) | ORAL | Status: DC | PRN
  Administered 2016-05-29: 500 mg via ORAL
  Filled 2016-05-28: qty 1

## 2016-05-28 MED ORDER — METOPROLOL SUCCINATE ER 25 MG PO TB24: 25.0000 mg | ORAL_TABLET | Freq: Every day | ORAL | Status: DC

## 2016-05-28 MED ORDER — HYDROCORTISONE ACETATE 25 MG RE SUPP
25.0000 mg | Freq: Two times a day (BID) | RECTAL | Status: DC | PRN
  Filled 2016-05-28: qty 1

## 2016-05-28 MED ORDER — FLUTICASONE PROPIONATE 50 MCG/ACT NA SUSP
2.0000 | Freq: Every day | NASAL | Status: DC
  Filled 2016-05-28: qty 16

## 2016-05-28 MED ORDER — VANCOMYCIN HCL IN DEXTROSE 1-5 GM/200ML-% IV SOLN
1000.0000 mg | INTRAVENOUS | Status: DC
  Administered 2016-05-28: 1000 mg via INTRAVENOUS
  Filled 2016-05-28: qty 200

## 2016-05-28 MED ORDER — ALPRAZOLAM 0.25 MG PO TABS
0.2500 mg | ORAL_TABLET | Freq: Two times a day (BID) | ORAL | Status: DC
  Administered 2016-05-28: 0.25 mg via ORAL
  Filled 2016-05-28: qty 1

## 2016-05-28 MED ORDER — FUROSEMIDE 10 MG/ML IJ SOLN
40.0000 mg | Freq: Once | INTRAMUSCULAR | Status: AC
  Administered 2016-05-28: 40 mg via INTRAVENOUS
  Filled 2016-05-28: qty 4

## 2016-05-28 MED ORDER — DEXTROSE 5 % IV SOLN
500.0000 mg | Freq: Three times a day (TID) | INTRAVENOUS | Status: DC
  Administered 2016-05-28 – 2016-05-29 (×2): 500 mg via INTRAVENOUS
  Filled 2016-05-28 (×5): qty 0.5

## 2016-05-28 MED ORDER — LOPERAMIDE HCL 2 MG PO CAPS: 2.0000 mg | ORAL_CAPSULE | Freq: Every day | ORAL | Status: DC | PRN

## 2016-05-28 MED ORDER — ALFUZOSIN HCL ER 10 MG PO TB24
10.0000 mg | ORAL_TABLET | Freq: Every evening | ORAL | Status: DC
  Administered 2016-05-28: 10 mg via ORAL
  Filled 2016-05-28 (×2): qty 1

## 2016-05-28 MED ORDER — CALCIUM CARBONATE ANTACID 500 MG PO CHEW: 1.0000 | CHEWABLE_TABLET | Freq: Every day | ORAL | Status: DC | PRN

## 2016-05-28 MED ORDER — LEVOTHYROXINE SODIUM 25 MCG PO TABS
125.0000 ug | ORAL_TABLET | Freq: Every day | ORAL | Status: DC
  Administered 2016-05-29: 125 ug via ORAL
  Filled 2016-05-28: qty 1

## 2016-05-28 MED ORDER — LEVOFLOXACIN IN D5W 500 MG/100ML IV SOLN
500.0000 mg | Freq: Once | INTRAVENOUS | Status: AC
  Administered 2016-05-28: 500 mg via INTRAVENOUS
  Filled 2016-05-28: qty 100

## 2016-05-28 MED ORDER — ADULT MULTIVITAMIN W/MINERALS CH: 1.0000 | ORAL_TABLET | Freq: Every morning | ORAL | Status: DC

## 2016-05-28 NOTE — Progress Notes (Signed)
Pharmacy Antibiotic Note  Anthony Hutchinson is a 82 y.o. male admitted on 05/28/2016 with aspiration PNA.  Pharmacy has been consulted for Vanc/Aztreonam/Flagyl dosing. Note cephalosporin/PCN allergies  Plan: 1) Vancomycin 1g IV q48. Vanc Trough 15-20 mcg/mL. Note dosing conservative due to patient age, but can adjust vanc as needed 2) Aztreonam 500mg  IV q8 for CrCl 10-30 ml/min 3) Flagyl 500mg  IV q8  Weight: 160 lb (72.6 kg)  Temp (24hrs), Avg:97.9 F (36.6 C), Min:97.9 F (36.6 C), Max:97.9 F (36.6 C)   Recent Labs Lab 05/28/16 1428  WBC 11.1*  CREATININE 2.08*    Estimated Creatinine Clearance: 19.7 mL/min (by C-G formula based on SCr of 2.08 mg/dL (H)).    Allergies  Allergen Reactions  . Cephalosporins Rash  . Codeine Sulfate Itching    Itching on fingers   . Penicillins Other (See Comments)    Itching on fingers Has patient had a PCN reaction causing immediate rash, facial/tongue/throat swelling, SOB or lightheadedness with hypotension: Has patient had a PCN reaction causing severe rash involving mucus membranes or skin necrosis: Has patient had a PCN reaction that required hospitalization Has patient had a PCN reaction occurring within the last 10 years:  If all of the above answers are "NO", then may proceed with Cephalosporin use.     Thank you for allowing pharmacy to be a part of this patient's care.   Adrian Saran, PharmD, BCPS Pager [phone] 05/28/2016 6:01 PM

## 2016-05-28 NOTE — ED Notes (Signed)
Bed: WA08 Expected date:  Expected time:  Means of arrival:  Comments: EMS-ams vs dementia

## 2016-05-28 NOTE — ED Triage Notes (Signed)
Per EMS report: pt coming from home and is normally taken care of by son.  Pt appears to be disheveled with dry food on his mouth. Pt has a pacemaker. Pt has some skin tears all over his arms.  Pt alert and knows who he his, what the year is and who the president is.  Pt is confused about place and month. EMS states it appears the son is overwhelmed with taking care of his father. Pt denies any complaints. Family stated to EMS that father is SOB and has developed acute confusion.  CBG: 155 BP: 140/76 HR: 92, paced O2: 99%, RA.

## 2016-05-28 NOTE — H&P (Signed)
TRH H&P   Patient Demographics:    Anthony Hutchinson, is a 82 y.o. male  MRN: [id number]   DOB - [birth year]  Admit Date - 05/28/2016  Outpatient Primary MD for the patient is Merrilee Seashore, MD  Referring MD/NP/PA: Dr Judithann Sheen  Outpatient Specialists: [facility] [place]  Patient coming from: home  Chief Complaint  Patient presents with  . Altered Mental Status  . Weakness      HPI:    Anthony Hutchinson  is a 83 y.o. male, 82 y.o. male with a past medical history significant for myelodysplastic syndrome, CKD 4, CHF EF 45%, hypothyroidism, possible cirrhosis, history of prostate cancer with radiation proctitis, and CHB with pacer . Patient was brought by his son for multiple complaints, including poor progressive weakness, increased lethargy, decreased appetite and failure to thrive over the last couple weeks, as well patient reports progressive dyspnea, cough, productive, blood-tinged, denies any dysuria or polyuria, chest pain buster per rectum or coffee-ground emesis nausea or vomiting. Patient with known history of MDS, requiring frequent transfusions managed by Dr. Alvy Bimler - in ED chest x-ray was significant for multiple opacities represent edema versus pneumonia, creatinine 2.08 which is baseline, hemoglobin 8.8 (baseline is 7.5, but he received transfusion last week) he is afebrile, with a leukocytosis of 11.1     Review of systems:    In addition to the HPI above, No Fever-chills, No Headache, No changes with Vision or hearing, Son reports problem with choking on food or liquids and solids No Chest pain, reports cough Cough and Shortness of Breath, No Abdominal pain, No Nausea or Vommitting, Bowel movements are regular, No Blood in stool or Urine, No dysuria, No new skin rashes or bruises, No new joints pains-aches,  No new weakness, tingling, numbness in any extremity,  denies weakness and fatigue No recent weight gain or loss, No polyuria, polydypsia or polyphagia, No significant Mental Stressors.  A full 10 point Review of Systems was done, except as stated above, all other Review of Systems were negative.   With Past History of the following :    Past Medical History:  Diagnosis Date  . Anemia    iron deficiency anemia  . Cancer ([place])   . Cataracts, bilateral   . CHF (congestive heart failure) ([place])   . CKD (chronic kidney disease)   . Complete heart block ([place])   . Diabetes mellitus without complication ([place])   . Hypertension   . Hypothyroidism    nodule  . Inguinal hernia   . LBBB (left bundle branch block)   . MDS (myelodysplastic syndrome) ([place]) 01/05/2014  . MDS (myelodysplastic syndrome) ([place])   . Peripheral vascular disease ([place])    Peripheral neuropathy  . Presence of permanent cardiac pacemaker       Past Surgical History:  Procedure Laterality Date  . ABDOMINAL AORTIC ANEURYSM REPAIR    . [place]  rotator cuff repair  . FLEXIBLE SIGMOIDOSCOPY N/A 12/13/2012   Procedure: FLEXIBLE SIGMOIDOSCOPY;  Surgeon: Beryle Beams, MD;  Location: [facility];  Service: Endoscopy;  Laterality: N/A;  . FLEXIBLE SIGMOIDOSCOPY N/A 03/12/2015   Procedure: FLEXIBLE SIGMOIDOSCOPY;  Surgeon: Carol Ada, MD;  Location: [facility]OS[facility];  Service: Endoscopy;  Laterality: N/A;  will need APC   . HERNIA REPAIR  1979   bilateral inguinal hernia  . HIATAL HERNIA REPAIR    . HOT HEMOSTASIS N/A 12/13/2012   Procedure: HOT HEMOSTASIS (ARGON PLASMA COAGULATION/BICAP);  Surgeon: Beryle Beams, MD;  Location: [facility];  Service: Endoscopy;  Laterality: N/A;  . HOT HEMOSTASIS N/A 03/12/2015   Procedure: HOT HEMOSTASIS (ARGON PLASMA COAGULATION/BICAP);  Surgeon: Carol Ada, MD;  Location: [facility];  Service: Endoscopy;  Laterality: N/A;  . IR GENERIC HISTORICAL  02/21/2016   IR US GUIDE VASC ACCESS RIGHT 02/21/2016 Arne Cleveland, MD  [facility]  . IR GENERIC HISTORICAL  02/21/2016   IR FLUORO GUIDE CV LINE RIGHT 02/21/2016 Arne Cleveland, MD [facility]  . PACEMAKER INSERTION  11-03-13   STJ dual chamber pacemaker implanted by Dr Lovena Le for CHB  . PERMANENT PACEMAKER INSERTION N/A 11/03/2013   Procedure: PERMANENT PACEMAKER INSERTION;  Surgeon: Evans Lance, MD;  Location: [facility] C[facility] LAB;  Service: Cardiovascular;  Laterality: N/A;  . PROSTATE SURGERY    . THYROID LOBECTOMY  6/85   right  . TONSILLECTOMY    . TONSILLECTOMY        Social History:     Social History  Substance Use Topics  . Smoking status: Former Smoker    Types: Pipe    Quit date: 11/27/1963  . Smokeless tobacco: Never Used  . Alcohol use No     Lives -  At home with son  Mobility - usually with walker, but most recently with wheelchair    Family History :     Family History  Problem Relation Age of Onset  . Heart disease Mother   . Emphysema Mother   . Congestive Heart Failure Father   . Heart disease Son   . Hyperlipidemia Son       Home Medications:   Prior to Admission medications   Medication Sig Start Date End Date Taking? Authorizing Provider  acetaminophen (TYLENOL) 500 MG tablet Take 500 mg by mouth every 6 (six) hours as needed (pain).   Yes Historical Provider, MD  alfuzosin (UROXATRAL) 10 MG 24 hr tablet Take 10 mg by mouth every evening.    Yes Historical Provider, MD  ALPRAZolam (XANAX) 0.25 MG tablet Take 0.25 mg by mouth 2 (two) times daily. 05/19/16  Yes Historical Provider, MD  aminocaproic acid (AMICAR) 500 MG tablet Take 1 tablet (500 mg total) by mouth every 8 (eight) hours as needed for bleeding. 01/27/16  Yes Heath Lark, MD  azithromycin (ZITHROMAX) 250 MG tablet Take 250 mg by mouth as directed. Take 500mg  on day 1, then 250mg  once daily x 4 days 05/25/16  Yes Historical Provider, MD  calcium carbonate (TUMS - DOSED IN MG ELEMENTAL CALCIUM) 500 MG chewable tablet Chew 1 tablet by mouth daily as needed for  indigestion or heartburn.   Yes Historical Provider, MD  fluticasone (FLONASE) 50 MCG/ACT nasal spray Place 2 sprays into both nostrils daily.   Yes Historical Provider, MD  furosemide (LASIX) 20 MG tablet Take 1 tablet (20 mg total) by mouth daily. 12/22/15  Yes Evans Lance, MD  hydrocortisone (ANUSOL-HC) 25 MG suppository  Place 1 suppository (25 mg total) rectally 2 (two) times daily as needed for hemorrhoids or itching. 04/07/16  Yes Shanker Kristeen Mans, MD  lidocaine-prilocaine (EMLA) cream Apply 1 application topically as needed. Apply to port 1 hour prior to access. 02/22/16  Yes Heath Lark, MD  loperamide (IMODIUM) 2 MG capsule Take 2 mg by mouth daily as needed for diarrhea or loose stools.   Yes Historical Provider, MD  Multiple Vitamin (MULTIVITAMIN WITH MINERALS) TABS Take 1 tablet by mouth every morning.   Yes Historical Provider, MD  SYNTHROID 125 MCG tablet Take 125 mcg by mouth daily before breakfast.  09/15/14  Yes Historical Provider, MD  TOPROL XL 25 MG 24 hr tablet Take 25 mg by mouth daily. 12/24/13  Yes Historical Provider, MD     Allergies:     Allergies  Allergen Reactions  . Cephalosporins Rash  . Codeine Sulfate Itching    Itching on fingers   . Penicillins Other (See Comments)    Itching on fingers Has patient had a PCN reaction causing immediate rash, facial/tongue/throat swelling, SOB or lightheadedness with hypotension: Has patient had a PCN reaction causing severe rash involving mucus membranes or skin necrosis: Has patient had a PCN reaction that required hospitalization Has patient had a PCN reaction occurring within the last 10 years:  If all of the above answers are "NO", then may proceed with Cephalosporin use.      Physical Exam:   Vitals  Blood pressure 124/63, pulse 92, temperature 97.9 F (36.6 C), temperature source Oral, resp. rate 14, weight 72.6 kg (160 lb), SpO2 96 %.   1. General Extremely frail, ill-appearing male  lying in bed in NAD,      2. Thought to respond, but appropriate in answering question, Awake Alert, Oriented X 2.  3. No F.N deficits, ALL C.Nerves Intact, Strength 5/5 all 4 extremities, Sensation intact all 4 extremities, Plantars down going.  4. Ears and Eyes appear Normal, Conjunctivae clear, PERRLA. Dry Oral Mucosa.  5. Supple Neck, No JVD,  No Carotid Bruits.  6. Symmetrical Chest wall movement, Good air movement bilaterally, no wheezing.  7. RRR, No Gallops, Rubs or Murmurs, No Parasternal Heave.  8. Positive Bowel Sounds, Abdomen Soft, No tenderness, No organomegaly appriciated,No rebound -guarding or rigidity.  9.  No Cyanosis, Normal Skin Turgor, No Skin Rash or Bruise.  10. Significant muscle wasting,  joints appear normal , no effusions, Normal ROM.  11. No Palpable Lymph Nodes in Neck or Axillae     Data Review:    CBC  Recent Labs Lab 05/28/16 1428  WBC 11.1*  HGB 8.8*  HCT 27.2*  PLT 110*  MCV 91.6  MCH 29.6  MCHC 32.4  RDW 21.5*  LYMPHSABS 1.1  MONOABS 0.8  EOSABS 0.1  BASOSABS 0.0   ------------------------------------------------------------------------------------------------------------------  Chemistries   Recent Labs Lab 05/28/16 1428  NA 135  K 4.3  CL 102  CO2 26  GLUCOSE 118*  BUN 33*  CREATININE 2.08*  CALCIUM 8.4*   ------------------------------------------------------------------------------------------------------------------ estimated creatinine clearance is 19.7 mL/min (by C-G formula based on SCr of 2.08 mg/dL (H)). ------------------------------------------------------------------------------------------------------------------ No results for input(s): TSH, T4TOTAL, T3FREE, THYROIDAB in the last 72 hours.  Invalid input(s): FREET3  Coagulation profile No results for input(s): INR, PROTIME in the last 168 hours. ------------------------------------------------------------------------------------------------------------------- No results  for input(s): DDIMER in the last 72 hours. -------------------------------------------------------------------------------------------------------------------  Cardiac Enzymes No results for input(s): CKMB, TROPONINI, MYOGLOBIN in the last 168 hours.  Invalid input(s): CK ------------------------------------------------------------------------------------------------------------------  No results found for: BNP   ---------------------------------------------------------------------------------------------------------------  Urinalysis    Component Value Date/Time   COLORURINE YELLOW 05/28/2016 [place] 05/28/2016 1502   LABSPEC 1.021 05/28/2016 1502   LABSPEC 1.010 05/18/2016 1028   PHURINE 5.5 05/28/2016 1502   GLUCOSEU NEGATIVE 05/28/2016 1502   GLUCOSEU Negative 05/18/2016 1028   HGBUR SMALL (A) 05/28/2016 1502   BILIRUBINUR NEGATIVE 05/28/2016 1502   BILIRUBINUR Negative 05/18/2016 1028   KETONESUR NEGATIVE 05/28/2016 1502   PROTEINUR 30 (A) 05/28/2016 1502   UROBILINOGEN 0.2 05/18/2016 1028   NITRITE NEGATIVE 05/28/2016 1502   LEUKOCYTESUR SMALL (A) 05/28/2016 1502   LEUKOCYTESUR Negative 05/18/2016 1028    ----------------------------------------------------------------------------------------------------------------   Imaging Results:    Dg Chest 2 View  Result Date: 05/28/2016 CLINICAL DATA:  Dyspnea.  Cough.  Aspiration. EXAM: CHEST  2 VIEW COMPARISON:  04/04/2016 FINDINGS: Lateral view degraded by patient arm position. Patient rotated to the left. Right sided Port-A-Cath terminates at the low SVC. Dual lead pacer. Cardiomegaly accentuated by AP portable technique. Small bilateral pleural effusions. No pneumothorax. Interstitial prominence is greater on the right than left and not significantly changed. Bilateral airspace opacities, including within the right upper and both lower lobes. The right upper lobe opacity is a somewhat nodular in appearance  and is in the region of support apparatus artifact. IMPRESSION: congestive heart failure with small bilateral pleural effusions. Multifocal airspace opacities which could represent alveolar edema or infection/ aspiration. Apparent nodular component of the right upper lobe opacity which could be partially artifactual. Recommend attention on follow-up. Electronically Signed   By: Abigail Miyamoto M.D.   On: 05/28/2016 14:54    My personal review of [id number] , Rate  97 /min, QTc 528 , no Acute ST changes   Assessment & Plan:    Active Problems:   Anemia associated with chronic renal failure   Chronic systolic heart failure (HCC)   MDS (myelodysplastic syndrome) (HCC)   Thrombocytopenia (HCC)   Pacemaker   Stage III chronic kidney disease   Hypothyroidism   Altered mental state   Pneumonia   Failure to thrive/altered mental status - Patient with progressive decline over the last 2 weeks, this is most likely due to infectious processes versus CHF. - We will consult PT  Pneumonia - Most likely aspiration, giving dysphagia, admitted on the pneumonia pathway, will follow on Legionella antigen, continue with IV vancomycin and aztreonam. - Patient is afebrile with no leukocytosis, unclear x-ray finding related to volume overload versus infectious process, will give one dose of IV Lasix today, and repeat chest x-ray in a.m..  UTI - Continue with aztreonam, follow on urine cultures  Dysphagia - Family report choking while eating, we'll keep nothing by mouth till seen by SLP.  Acute on chronic CHF - Chest x-ray with evidence of pulmonary edema, pleural effusion., Will check proBNP, will give one dose of IV Lasix today, and reassess in a.m. regarding further diuresis. - Most recent echo in April 2015 EF 45-50 %, will repeat echo - Continue with beta blockers and Lasix when necessary, ACEI/ARB given her renal failure  Hypothyroidism - Continue Synthroid  Anemia  - This is secondary to MDS and  CKD, transfuse as needed, recently transfused by Dr. Alvy Bimler as an outpatient, currently hemoglobin is stable  Thrombocytopenia - At baseline, continue to monitor, his SCD DVT prophylaxis,  Complete heart block - Status post pacer   DVT Prophylaxis SCDs, no chemical prophylaxis given the recurrent rectal bleed and  thrombocytopenia.  AM Labs Ordered, also please review Full Orders  Family Communication: Admission, patients condition and plan of care including tests being ordered have been discussed with the patient and son and daughter who indicate understanding and agree with the plan and Code Status.  Code Status DO NOT RESUSCITATE, confirmed by son  Likely DC to  home  Condition GUARDED    Consults called: None   Admission status: OBS  Time spent in minutes : 65 minutes   Kalicia Dufresne M.D on 05/28/2016 at 4:50 PM  Between 7am to 7pm - Pager - [phone]. After 7pm go to [email or web address] - password [facility]  [facility] - Office  [phone]

## 2016-05-28 NOTE — ED Notes (Signed)
Pt given urinal and pt and family informed of need for urine specimen.

## 2016-05-28 NOTE — Plan of Care (Signed)
Problem: Skin Integrity: Goal: Risk for impaired skin integrity will decrease Outcome: Progressing Turning and using pillows

## 2016-05-28 NOTE — ED Notes (Signed)
Spoke with Dr. Wilson Singer about the pt being able to take POs at this time. He states that pt was choking on fluids earlier and he will defer to the admitting physician in regards to diet and po intake. Family informed.

## 2016-05-28 NOTE — ED Notes (Signed)
Labs will be drawn from PORT.

## 2016-05-28 NOTE — ED Provider Notes (Signed)
[facility] DEPT Provider Note   CSN: [id number] Arrival date & time: 05/28/16  1239  By signing my name below, I, Sonum Patel, attest that this documentation has been prepared under the direction and in the presence of Virgel Manifold, MD. Electronically Signed: Sonum Patel, Education administrator. 05/28/16. 1:50 PM.  History   Chief Complaint Chief Complaint  Patient presents with  . Altered Mental Status  . Weakness    The history is provided by the patient and a relative. No language interpreter was used.     HPI Comments: Anthony Hutchinson is a 82 y.o. male brought in by ambulance, with past medical history of anemia, CA, CHF, DM, myelodysplastic syndrome who presents to the Emergency Department complaining of intermittent episodes of confusion for the past 1-2 weeks. Patient lives with his son who reports patient was last seen by PCP last week; they checked his urine and blood work which were negative. He was started on Xanax to calm him which hasn't been very helpful. Son reports patient considered hospice last week but did not agree with stopping his blood transfusions which he gets weekly for anemia (last transfusion was 2 weeks ago). He was seen by PCP again and was started on Z-pac, cough medicine, and nasal spray due to sick contacts with sinus infection. Son states patient's coughing episodes have become less frequent since then but are more severe in nature. He states the coughing also occurs after eating and drinking. Patient uses a walker at baseline but lately has required assistance to wheelchair. Son states he had a UTI a few weeks ago which also caused patient to be confused.   Past Medical History:  Diagnosis Date  . Anemia    iron deficiency anemia  . Cancer ([place])   . Cataracts, bilateral   . CHF (congestive heart failure) ([place])   . CKD (chronic kidney disease)   . Complete heart block ([place])   . Diabetes mellitus without complication ([place])   . Hypertension   . Hypothyroidism    nodule  . Inguinal hernia   . LBBB (left bundle branch block)   . MDS (myelodysplastic syndrome) ([place]) 01/05/2014  . MDS (myelodysplastic syndrome) ([place])   . Peripheral vascular disease ([place])    Peripheral neuropathy  . Presence of permanent cardiac pacemaker     Patient Active Problem List   Diagnosis Date Noted  . Dysuria 05/18/2016  . Altered mental state 05/18/2016  . Cough 05/18/2016  . UTI (lower urinary tract infection) 04/05/2016  . Port catheter in place 03/09/2016  . Bacterial skin infection of leg 01/27/2016  . Hypothyroidism 01/14/2016  . External hemorrhoids   . Quality of life palliative care encounter 11/18/2015  . Stage III chronic kidney disease 03/13/2015  . Rectal bleeding 03/11/2015  . AAA (abdominal aortic aneurysm) without rupture ([place]) 11/20/2014  . Pacemaker 11/17/2014  . Thrombocytopenia ([place]) 11/13/2014  . Leaking abdominal aortic aneurysm ([place]) 11/13/2014  . Splenomegaly 11/13/2014  . MDS (myelodysplastic syndrome) ([place]) 01/05/2014  . Atrioventricular block, complete ([place]) 11/03/2013  . Chronic systolic heart failure ([place]) 10/31/2013  . Bradycardia 10/31/2013  . Complete heart block ([place]) 10/31/2013  . Cholelithiasis 05/14/2012  . Anemia associated with chronic renal failure 05/25/2011  . Anemia in neoplastic disease 05/25/2011    Past Surgical History:  Procedure Laterality Date  . ABDOMINAL AORTIC ANEURYSM REPAIR    . ACROMIONECTOMY  1998   rotator cuff repair  . FLEXIBLE SIGMOIDOSCOPY N/A 12/13/2012   Procedure: FLEXIBLE SIGMOIDOSCOPY;  Surgeon: Saralyn Pilar  Renee Ramus, MD;  Location: [facility];  Service: Endoscopy;  Laterality: N/A;  . FLEXIBLE SIGMOIDOSCOPY N/A 03/12/2015   Procedure: FLEXIBLE SIGMOIDOSCOPY;  Surgeon: Carol Ada, MD;  Location: [facility];  Service: Endoscopy;  Laterality: N/A;  will need APC   . HERNIA REPAIR  1979   bilateral inguinal hernia  . HIATAL HERNIA REPAIR    . HOT HEMOSTASIS N/A 12/13/2012   Procedure: HOT  HEMOSTASIS (ARGON PLASMA COAGULATION/BICAP);  Surgeon: Beryle Beams, MD;  Location: [facility];  Service: Endoscopy;  Laterality: N/A;  . HOT HEMOSTASIS N/A 03/12/2015   Procedure: HOT HEMOSTASIS (ARGON PLASMA COAGULATION/BICAP);  Surgeon: Carol Ada, MD;  Location: [facility];  Service: Endoscopy;  Laterality: N/A;  . IR GENERIC HISTORICAL  02/21/2016   IR US GUIDE VASC ACCESS RIGHT 02/21/2016 Arne Cleveland, MD [facility]  . IR GENERIC HISTORICAL  02/21/2016   IR FLUORO GUIDE CV LINE RIGHT 02/21/2016 Arne Cleveland, MD [facility]  . PACEMAKER INSERTION  11-03-13   STJ dual chamber pacemaker implanted by Dr Lovena Le for CHB  . PERMANENT PACEMAKER INSERTION N/A 11/03/2013   Procedure: PERMANENT PACEMAKER INSERTION;  Surgeon: Evans Lance, MD;  Location: [facility] CATH LAB;  Service: Cardiovascular;  Laterality: N/A;  . PROSTATE SURGERY    . THYROID LOBECTOMY  6/85   right  . TONSILLECTOMY    . TONSILLECTOMY         Home Medications    Prior to Admission medications   Medication Sig Start Date End Date Taking? Authorizing Provider  acetaminophen (TYLENOL) 500 MG tablet Take 500 mg by mouth every 6 (six) hours as needed (pain).    Historical Provider, MD  alfuzosin (UROXATRAL) 10 MG 24 hr tablet Take 10 mg by mouth every evening.     Historical Provider, MD  aminocaproic acid (AMICAR) 500 MG tablet Take 1 tablet (500 mg total) by mouth every 8 (eight) hours as needed for bleeding. 01/27/16   Heath Lark, MD  calcium carbonate (TUMS - DOSED IN MG ELEMENTAL CALCIUM) 500 MG chewable tablet Chew 1 tablet by mouth daily as needed for indigestion or heartburn.    Historical Provider, MD  furosemide (LASIX) 20 MG tablet Take 1 tablet (20 mg total) by mouth daily. 12/22/15   Evans Lance, MD  hydrocortisone (ANUSOL-HC) 25 MG suppository Place 1 suppository (25 mg total) rectally 2 (two) times daily as needed for hemorrhoids or itching. 04/07/16   Jonetta Osgood, MD  levofloxacin (LEVAQUIN) 500  MG tablet Take 1 tablet (500 mg total) by mouth every other day. 04/08/16   Shanker Kristeen Mans, MD  lidocaine-prilocaine (EMLA) cream Apply 1 application topically as needed. Apply to port 1 hour prior to access. 02/22/16   Heath Lark, MD  loperamide (IMODIUM) 2 MG capsule Take 2 mg by mouth daily as needed for diarrhea or loose stools.    Historical Provider, MD  Multiple Vitamin (MULTIVITAMIN WITH MINERALS) TABS Take 1 tablet by mouth every morning.    Historical Provider, MD  SYNTHROID 125 MCG tablet Take 125 mcg by mouth daily before breakfast.  09/15/14   Historical Provider, MD  TOPROL XL 25 MG 24 hr tablet Take 25 mg by mouth daily. 12/24/13   Historical Provider, MD    Family History Family History  Problem Relation Age of Onset  . Heart disease Mother   . Emphysema Mother   . Congestive Heart Failure Father   . Heart disease Son   . Hyperlipidemia Son  Social History Social History  Substance Use Topics  . Smoking status: Former Smoker    Types: Pipe    Quit date: 11/27/1963  . Smokeless tobacco: Never Used  . Alcohol use No     Allergies   Cephalosporins; Codeine sulfate; and Penicillins   Review of Systems Review of Systems   A complete 10 system review of systems was obtained and all systems are negative except as noted in the HPI and PMH.   Physical Exam Updated Vital Signs BP 139/70 (BP Location: Right Arm)   Pulse 99   Temp 97.9 F (36.6 C) (Oral)   Resp 18   Wt 160 lb (72.6 kg)   SpO2 92%   BMI 26.63 kg/m   Physical Exam  Constitutional: He is oriented to person, place, and time.  Very weak and deconditioned appearing   HENT:  Head: Normocephalic and atraumatic.  Eyes: EOM are normal.  Neck: Normal range of motion.  Cardiovascular: Normal rate, regular rhythm, normal heart sounds and intact distal pulses.   Pulmonary/Chest: Effort normal. No respiratory distress. He has decreased breath sounds. He has rhonchi.  Drank water while in water and he  was obviously aspirating. Wet sounding cough. Rhonchi bilaterally with decreased air movement on right.   Abdominal: Soft. He exhibits no distension. There is no tenderness.  Musculoskeletal: Normal range of motion. He exhibits edema (Symmetric lower extremity edema).  Neurological: He is alert and oriented to person, place, and time.  Skin: Skin is warm and dry.  Psychiatric: He has a normal mood and affect. Judgment normal.  Nursing note and vitals reviewed.    ED Treatments / Results  DIAGNOSTIC STUDIES: Oxygen Saturation is 92% on [place] 2L/min, low by my interpretation.    COORDINATION OF CARE: 1:29 PM Discussed treatment plan with patient and family at bedside and they agreed to plan.   Labs (all labs ordered are listed, but only abnormal results are displayed) Labs Reviewed - No data to display  EKG  EKG Interpretation None       Radiology No results found.   Dg Chest 2 View  Result Date: 05/28/2016 CLINICAL DATA:  Dyspnea.  Cough.  Aspiration. EXAM: CHEST  2 VIEW COMPARISON:  04/04/2016 FINDINGS: Lateral view degraded by patient arm position. Patient rotated to the left. Right sided Port-A-Cath terminates at the low SVC. Dual lead pacer. Cardiomegaly accentuated by AP portable technique. Small bilateral pleural effusions. No pneumothorax. Interstitial prominence is greater on the right than left and not significantly changed. Bilateral airspace opacities, including within the right upper and both lower lobes. The right upper lobe opacity is a somewhat nodular in appearance and is in the region of support apparatus artifact. IMPRESSION: congestive heart failure with small bilateral pleural effusions. Multifocal airspace opacities which could represent alveolar edema or infection/ aspiration. Apparent nodular component of the right upper lobe opacity which could be partially artifactual. Recommend attention on follow-up. Electronically Signed   By: Abigail Miyamoto M.D.   On:  05/28/2016 14:54   Dg Chest Port 1 View  Result Date: 05/29/2016 CLINICAL DATA:  Cough. EXAM: PORTABLE CHEST 1 VIEW COMPARISON:  05/28/2016. FINDINGS: PowerPort catheter and stable position. Cardiac pacer with lead tips in right atrium right ventricle. Cardiomegaly with bilateral pulmonary vascular prominence interstitial prominence with bilateral pleural effusions consistent congestive heart failure. Findings progressed from prior exam. Pneumonitis cannot be excluded. No pneumothorax. IMPRESSION: 1.  PowerPort catheter noted in stable position.  A 2. Cardiac pacer stable position. Cardiomegaly  with pulmonary venous congestion and bilateral interstitial prominence with right pleural effusion. Findings consistent congestive heart failure. Findings progressed from prior exam. Electronically Signed   By: Marcello Moores  Register   On: 05/29/2016 06:55   Dg Swallowing Func-speech Pathology  Result Date: 05/29/2016 Objective Swallowing Evaluation: Type of Study: MBS-Modified Barium Swallow Study Patient Details Name: Anthony Hutchinson MRN: [id number] Date of Birth: 1921-12-13 Today's Date: 05/29/2016 Time: SLP Start Time (ACUTE ONLY): 1346-SLP Stop Time (ACUTE ONLY): 1420 SLP Time Calculation (min) (ACUTE ONLY): 34 min Past Medical History: Past Medical History: Diagnosis Date . Anemia   iron deficiency anemia . Cancer ([place])  . Cataracts, bilateral  . CHF (congestive heart failure) ([place])  . CKD (chronic kidney disease)  . Complete heart block ([place])  . Diabetes mellitus without complication ([place])  . Hypertension  . Hypothyroidism   nodule . Inguinal hernia  . LBBB (left bundle branch block)  . MDS (myelodysplastic syndrome) ([place]) 01/05/2014 . MDS (myelodysplastic syndrome) ([place])  . Peripheral vascular disease ([place])   Peripheral neuropathy . Presence of permanent cardiac pacemaker  Past Surgical History: Past Surgical History: Procedure Laterality Date . ABDOMINAL AORTIC ANEURYSM REPAIR   . ACROMIONECTOMY  1998  rotator cuff  repair . FLEXIBLE SIGMOIDOSCOPY N/A 12/13/2012  Procedure: FLEXIBLE SIGMOIDOSCOPY;  Surgeon: Beryle Beams, MD;  [facility]: [facility];  Service: Endoscopy;  Laterality: N/A; . FLEXIBLE SIGMOIDOSCOPY N/A 03/12/2015  Procedure: FLEXIBLE SIGMOIDOSCOPY;  Surgeon: Carol Ada, MD;  Location: [facility];  Service: Endoscopy;  Laterality: N/A;  will need APC  . HERNIA REPAIR  1979  bilateral inguinal hernia . HIATAL HERNIA REPAIR   . HOT HEMOSTASIS N/A 12/13/2012  Procedure: HOT HEMOSTASIS (ARGON PLASMA COAGULATION/BICAP);  Surgeon: Beryle Beams, MD;  Location: [facility];  Service: Endoscopy;  Laterality: N/A; . HOT HEMOSTASIS N/A 03/12/2015  Procedure: HOT HEMOSTASIS (ARGON PLASMA COAGULATION/BICAP);  Surgeon: Carol Ada, MD;  Location: [facility];  Service: Endoscopy;  Laterality: N/A; . IR GENERIC HISTORICAL  02/21/2016  IR US GUIDE VASC ACCESS RIGHT 02/21/2016 Arne Cleveland, MD [facility] . IR GENERIC HISTORICAL  02/21/2016  IR FLUORO GUIDE CV LINE RIGHT 02/21/2016 Arne Cleveland, MD [facility] . PACEMAKER INSERTION  11-03-13  STJ dual chamber pacemaker implanted by Dr Lovena Le for CHB . PERMANENT PACEMAKER INSERTION N/A 11/03/2013  Procedure: PERMANENT PACEMAKER INSERTION;  Surgeon: Evans Lance, MD;  Location[facility] LAB;  Service: Cardiovascular;  Laterality: N/A; . PROSTATE SURGERY   . THYROID LOBECTOMY  6/85  right . TONSILLECTOMY   . TONSILLECTOMY   HPI: ORDELL ZERR is a 82 y.o. with h/o anemia, CA, CHF, DM, myelodysplastic syndrome of confusion for the past 1-2 weeks. Patient lives with his son who reports patient was last seen by PCP last week; they checked his urine and blood work which were negative. Son reported to MD that patient considered hospice last week but did not agree with stopping his blood transfusions which he gets weekly for anemia (last transfusion was 2 weeks ago). He was seen by PCP and started on Z-pac, cough medicine, and nasal spray due to sick contacts with sinus infection. Son  states patient's coughing episodes are severe in nature and he does sometimes choke when eating/drinking if not sitting fully upright.   Pt denies GERD but does admits to taking medicine similar to Tums on occasion.  He uses a walker at baseline but lately has required assistance to wheelchair. Son state to MD that pt had a UTI a few  weeks ago which also caused patient to be confused.  Swallow evaluation ordered upon admit. Pt CXR showed worsening heart failure and right pleural effusion.   Subjective: pt awake in bed, son arrived during session Assessment / Plan / Recommendation CHL IP CLINICAL IMPRESSIONS 05/29/2016 Therapy Diagnosis Moderate oral phase dysphagia;Severe pharyngeal phase dysphagia;Moderate cervical esophageal phase dysphagia Clinical Impression Pt presents with moderate oral and severe pharyngeal dysphagia with sensorimotor impairments.  Pt's swallow is grossly weak resulting in gross residuals with all consistencies tested.  Pt requires multiple swallows (3-4) to clear 1 tsp of liquid.  Aspiration *silent in nature* noted with thin, nectar and secretions without clearance despite cued dry swallows/cough.  Chin tuck posture and head turn left did not facilitate clearance.  Pt was able to cough/expectorate x2 during testing however gross residuals recurred with EACH swallow and pt's "hocking" performance was inconsistent.    Using live video, educated pt and son to findings and encouraged precautions.   Impact on safety and function Severe aspiration risk;Risk for inadequate nutrition/hydration   CHL IP TREATMENT RECOMMENDATION 05/29/2016 Treatment Recommendations Therapy as outlined in treatment plan below   Prognosis 05/29/2016 Prognosis for Safe Diet Advancement Guarded Barriers to Reach Goals Severity of deficits Barriers/Prognosis Comment -- CHL IP DIET RECOMMENDATION 05/29/2016 SLP Diet Recommendations NPO Liquid Administration via -- Medication Administration -- Compensations Small  sips/bites;Slow rate;Multiple dry swallows after each bite/sip Postural Changes Seated upright at 90 degrees;Remain semi-upright after after feeds/meals (Comment)   CHL IP OTHER RECOMMENDATIONS 05/29/2016 Recommended Consults -- Oral Care Recommendations Oral care BID Other Recommendations --   CHL IP FOLLOW UP RECOMMENDATIONS 05/29/2016 Follow up Recommendations None   CHL IP FREQUENCY AND DURATION 05/29/2016 Speech Therapy Frequency (ACUTE ONLY) min 1 x/week Treatment Duration 1 week      CHL IP ORAL PHASE 05/29/2016 Oral Phase Impaired Oral - Pudding Teaspoon -- Oral - Pudding Cup -- Oral - Honey Teaspoon -- Oral - Honey Cup -- Oral - Nectar Teaspoon Weak lingual manipulation;Reduced posterior propulsion;Delayed oral transit Oral - Nectar Cup Weak lingual manipulation;Reduced posterior propulsion;Delayed oral transit Oral - Nectar Straw -- Oral - Thin Teaspoon Weak lingual manipulation;Reduced posterior propulsion;Delayed oral transit Oral - Thin Cup Weak lingual manipulation;Reduced posterior propulsion;Delayed oral transit Oral - Thin Straw -- Oral - Puree Weak lingual manipulation;Reduced posterior propulsion;Delayed oral transit Oral - Mech Soft -- Oral - Regular -- Oral - Multi-Consistency -- Oral - Pill -- Oral Phase - Comment --  CHL IP PHARYNGEAL PHASE 05/29/2016 Pharyngeal Phase Impaired Pharyngeal- Pudding Teaspoon -- Pharyngeal -- Pharyngeal- Pudding Cup -- Pharyngeal -- Pharyngeal- Honey Teaspoon -- Pharyngeal -- Pharyngeal- Honey Cup -- Pharyngeal -- Pharyngeal- Nectar Teaspoon Delayed swallow initiation-vallecula;Reduced pharyngeal peristalsis;Reduced epiglottic inversion;Reduced anterior laryngeal mobility;Reduced laryngeal elevation;Reduced airway/laryngeal closure;Reduced tongue base retraction;Pharyngeal residue - valleculae;Lateral channel residue Pharyngeal -- Pharyngeal- Nectar Cup Delayed swallow initiation-vallecula;Reduced pharyngeal peristalsis;Reduced epiglottic inversion;Reduced  anterior laryngeal mobility;Reduced laryngeal elevation;Reduced airway/laryngeal closure;Penetration/Aspiration before swallow;Pharyngeal residue - valleculae;Lateral channel residue Pharyngeal -- Pharyngeal- Nectar Straw -- Pharyngeal -- Pharyngeal- Thin Teaspoon Delayed swallow initiation-vallecula;Reduced pharyngeal peristalsis;Reduced epiglottic inversion;Reduced anterior laryngeal mobility;Reduced laryngeal elevation;Reduced tongue base retraction;Penetration/Aspiration during swallow;Penetration/Apiration after swallow;Trace aspiration;Pharyngeal residue - valleculae;Lateral channel residue Pharyngeal Material enters airway, CONTACTS cords and not ejected out;Material enters airway, passes BELOW cords without attempt by patient to eject out (silent aspiration) Pharyngeal- Thin Cup Delayed swallow initiation-vallecula;Reduced pharyngeal peristalsis;Reduced epiglottic inversion;Reduced anterior laryngeal mobility;Reduced laryngeal elevation;Reduced airway/laryngeal closure;Reduced tongue base retraction;Significant aspiration (Amount);Pharyngeal residue - valleculae;Lateral channel residue Pharyngeal -- Pharyngeal- Thin Straw --  Pharyngeal -- Pharyngeal- Puree Delayed swallow initiation-vallecula;Reduced pharyngeal peristalsis;Reduced epiglottic inversion;Reduced anterior laryngeal mobility;Reduced laryngeal elevation;Reduced airway/laryngeal closure;Reduced tongue base retraction;Pharyngeal residue - valleculae Pharyngeal -- Pharyngeal- Mechanical Soft -- Pharyngeal -- Pharyngeal- Regular -- Pharyngeal -- Pharyngeal- Multi-consistency -- Pharyngeal -- Pharyngeal- Pill -- Pharyngeal -- Pharyngeal Comment chin tuck posture with and without head turn left did not faciliate clearance, pt does not consistently sense pharyngeal residuals, cued cough/"hock" helped clear vallecular residuals x2 during testing but pt could not consistentely perform as it was exhausting for him, dry swallows weak but aided clearance  marginally  CHL IP CERVICAL ESOPHAGEAL PHASE 05/29/2016 Cervical Esophageal Phase Impaired Pudding Teaspoon -- Pudding Cup -- Honey Teaspoon -- Honey Cup -- Nectar Teaspoon -- Nectar Cup -- Nectar Straw -- Thin Teaspoon -- Thin Cup -- Thin Straw -- Puree -- Mechanical Soft -- Regular -- Multi-consistency -- Pill -- Cervical Esophageal Comment -- CHL IP GO 05/29/2016 Functional Assessment Tool Used mbs, clinical judgement Functional Limitations Swallowing Swallow Current Status [id number]) CM Swallow Goal Status [id number]) CM Swallow Discharge Status [id number]) (None) Motor Speech Current Status [id number]) (None) Motor Speech Goal Status UK:[id number]) (None) Motor Speech Goal Status [id number]) (None) Spoken Language Comprehension Current Status [id number]) (None) Spoken Language Comprehension Goal Status [id number]) (None) Spoken Language Comprehension Discharge Status [id number]) (None) Spoken Language Expression Current Status [id number]) (None) Spoken Language Expression Goal Status [id number]) (None) Spoken Language Expression Discharge Status [id number]) (None) Attention Current Status [id number]) (None) Attention Goal Status [id number]) (None) Attention Discharge Status [id number]) (None) Memory Current Status [id number]) (None) Memory Goal Status [id number]) (None) Memory Discharge Status QC:115444) (None) Voice Current Status [id number]) (None) Voice Goal Status [id number]) (None) Voice Discharge Status [id number]) (None) Other Speech-Language Pathology Functional Limitation [id number]) (None) Other Speech-Language Pathology Functional Limitation Goal Status [id number]) (None) Other Speech-Language Pathology Functional Limitation Discharge Status [phone]) (None) Claudie Fisherman, [facility] SLP [phone]               Procedures Procedures (including critical care time)  Medications Ordered in ED Medications - No data to display   Initial Impression / Assessment and Plan / ED Course  I have reviewed the triage vital signs and the nursing notes.  Pertinent labs & imaging  results that were available during my care of the patient were reviewed by me and considered in my medical decision making (see chart for details).  Clinical Course     82 year old male with progressive weakness and cough. He is aspirating. He is too weak to be cared for at home despite his son's help. He is hypoxic requiring supplemental oxygen to keep his O2 sats in the 90s. Will admit.  Final Clinical Impressions(s) / ED Diagnoses   Final diagnoses:  Dysphagia, unspecified type  Hypoxemia    New Prescriptions New Prescriptions   No medications on file   I personally preformed the services scribed in my presence. The recorded information has been reviewed is accurate. Virgel Manifold, MD.    Virgel Manifold, MD 06/07/16 [phone]

## 2016-05-29 ENCOUNTER — Observation Stay (HOSPITAL_COMMUNITY): Payer: Medicare Other

## 2016-05-29 ENCOUNTER — Observation Stay (HOSPITAL_BASED_OUTPATIENT_CLINIC_OR_DEPARTMENT_OTHER): Payer: Medicare Other

## 2016-05-29 DIAGNOSIS — I5023 Acute on chronic systolic (congestive) heart failure: Secondary | ICD-10-CM | POA: Diagnosis present

## 2016-05-29 DIAGNOSIS — Z8546 Personal history of malignant neoplasm of prostate: Secondary | ICD-10-CM | POA: Diagnosis not present

## 2016-05-29 DIAGNOSIS — R0902 Hypoxemia: Secondary | ICD-10-CM | POA: Diagnosis present

## 2016-05-29 DIAGNOSIS — Z87891 Personal history of nicotine dependence: Secondary | ICD-10-CM | POA: Diagnosis not present

## 2016-05-29 DIAGNOSIS — R627 Adult failure to thrive: Secondary | ICD-10-CM | POA: Diagnosis present

## 2016-05-29 DIAGNOSIS — I130 Hypertensive heart and chronic kidney disease with heart failure and stage 1 through stage 4 chronic kidney disease, or unspecified chronic kidney disease: Secondary | ICD-10-CM | POA: Diagnosis present

## 2016-05-29 DIAGNOSIS — R0600 Dyspnea, unspecified: Secondary | ICD-10-CM | POA: Diagnosis not present

## 2016-05-29 DIAGNOSIS — E1151 Type 2 diabetes mellitus with diabetic peripheral angiopathy without gangrene: Secondary | ICD-10-CM | POA: Diagnosis present

## 2016-05-29 DIAGNOSIS — Z66 Do not resuscitate: Secondary | ICD-10-CM | POA: Diagnosis present

## 2016-05-29 DIAGNOSIS — R1312 Dysphagia, oropharyngeal phase: Secondary | ICD-10-CM

## 2016-05-29 DIAGNOSIS — E039 Hypothyroidism, unspecified: Secondary | ICD-10-CM | POA: Diagnosis present

## 2016-05-29 DIAGNOSIS — D469 Myelodysplastic syndrome, unspecified: Secondary | ICD-10-CM | POA: Diagnosis present

## 2016-05-29 DIAGNOSIS — L89152 Pressure ulcer of sacral region, stage 2: Secondary | ICD-10-CM | POA: Diagnosis present

## 2016-05-29 DIAGNOSIS — Z515 Encounter for palliative care: Secondary | ICD-10-CM | POA: Diagnosis not present

## 2016-05-29 DIAGNOSIS — E1122 Type 2 diabetes mellitus with diabetic chronic kidney disease: Secondary | ICD-10-CM | POA: Diagnosis present

## 2016-05-29 DIAGNOSIS — N390 Urinary tract infection, site not specified: Secondary | ICD-10-CM | POA: Diagnosis present

## 2016-05-29 DIAGNOSIS — E1142 Type 2 diabetes mellitus with diabetic polyneuropathy: Secondary | ICD-10-CM | POA: Diagnosis present

## 2016-05-29 DIAGNOSIS — L8990 Pressure ulcer of unspecified site, unspecified stage: Secondary | ICD-10-CM | POA: Insufficient documentation

## 2016-05-29 DIAGNOSIS — Z923 Personal history of irradiation: Secondary | ICD-10-CM | POA: Diagnosis not present

## 2016-05-29 DIAGNOSIS — D696 Thrombocytopenia, unspecified: Secondary | ICD-10-CM | POA: Diagnosis present

## 2016-05-29 DIAGNOSIS — N184 Chronic kidney disease, stage 4 (severe): Secondary | ICD-10-CM | POA: Diagnosis present

## 2016-05-29 DIAGNOSIS — R05 Cough: Secondary | ICD-10-CM | POA: Diagnosis present

## 2016-05-29 DIAGNOSIS — R410 Disorientation, unspecified: Secondary | ICD-10-CM | POA: Diagnosis not present

## 2016-05-29 DIAGNOSIS — Z950 Presence of cardiac pacemaker: Secondary | ICD-10-CM | POA: Diagnosis not present

## 2016-05-29 DIAGNOSIS — D631 Anemia in chronic kidney disease: Secondary | ICD-10-CM | POA: Diagnosis present

## 2016-05-29 DIAGNOSIS — Z6822 Body mass index (BMI) 22.0-22.9, adult: Secondary | ICD-10-CM | POA: Diagnosis not present

## 2016-05-29 DIAGNOSIS — Z7189 Other specified counseling: Secondary | ICD-10-CM | POA: Diagnosis not present

## 2016-05-29 DIAGNOSIS — J690 Pneumonitis due to inhalation of food and vomit: Secondary | ICD-10-CM | POA: Diagnosis present

## 2016-05-29 LAB — ECHOCARDIOGRAM COMPLETE
Height: 65 in
WEIGHTICAEL: 2550.28 [oz_av]

## 2016-05-29 LAB — CBC
HEMATOCRIT: 26.8 % — AB (ref 39.0–52.0)
Hemoglobin: 8.6 g/dL — ABNORMAL LOW (ref 13.0–17.0)
MCH: 29.7 pg (ref 26.0–34.0)
MCHC: 32.1 g/dL (ref 30.0–36.0)
MCV: 92.4 fL (ref 78.0–100.0)
PLATELETS: 91 10*3/uL — AB (ref 150–400)
RBC: 2.9 MIL/uL — ABNORMAL LOW (ref 4.22–5.81)
RDW: 21.7 % — AB (ref 11.5–15.5)
WBC: 9.3 10*3/uL (ref 4.0–10.5)

## 2016-05-29 LAB — BASIC METABOLIC PANEL
Anion gap: 7 (ref 5–15)
BUN: 35 mg/dL — ABNORMAL HIGH (ref 6–20)
CALCIUM: 8.2 mg/dL — AB (ref 8.9–10.3)
CO2: 26 mmol/L (ref 22–32)
CREATININE: 2.28 mg/dL — AB (ref 0.61–1.24)
Chloride: 101 mmol/L (ref 101–111)
GFR calc non Af Amer: 23 mL/min — ABNORMAL LOW (ref >60)
GFR, EST AFRICAN AMERICAN: 27 mL/min — AB (ref >60)
GLUCOSE: 122 mg/dL — AB (ref 65–99)
Potassium: 4.2 mmol/L (ref 3.5–5.1)
Sodium: 134 mmol/L — ABNORMAL LOW (ref 135–145)

## 2016-05-29 LAB — HIV ANTIBODY (ROUTINE TESTING W REFLEX): HIV SCREEN 4TH GENERATION: NONREACTIVE

## 2016-05-29 MED ORDER — GLYCOPYRROLATE 0.2 MG/ML IJ SOLN
0.1000 mg | Freq: Four times a day (QID) | INTRAMUSCULAR | Status: DC
  Filled 2016-05-29: qty 0.5

## 2016-05-29 MED ORDER — SCOPOLAMINE 1 MG/3DAYS TD PT72
1.0000 | MEDICATED_PATCH | TRANSDERMAL | Status: DC
  Administered 2016-05-29: 1.5 mg via TRANSDERMAL
  Filled 2016-05-29: qty 1

## 2016-05-29 MED ORDER — ALBUTEROL SULFATE (2.5 MG/3ML) 0.083% IN NEBU: 2.5000 mg | INHALATION_SOLUTION | RESPIRATORY_TRACT | Status: DC | PRN

## 2016-05-29 MED ORDER — ACETAMINOPHEN 650 MG RE SUPP: 650.0000 mg | Freq: Four times a day (QID) | RECTAL | Status: DC | PRN

## 2016-05-29 MED ORDER — GLYCOPYRROLATE 0.2 MG/ML IJ SOLN
0.2000 mg | INTRAMUSCULAR | Status: DC | PRN
  Administered 2016-05-29: 0.2 mg via INTRAVENOUS
  Filled 2016-05-29 (×2): qty 1

## 2016-05-29 MED ORDER — ACETAMINOPHEN 325 MG PO TABS: 650.0000 mg | ORAL_TABLET | Freq: Four times a day (QID) | ORAL | Status: DC | PRN

## 2016-05-29 MED ORDER — ONDANSETRON HCL 4 MG/2ML IJ SOLN: 4.0000 mg | Freq: Four times a day (QID) | INTRAMUSCULAR | Status: DC | PRN

## 2016-05-29 MED ORDER — FLEET ENEMA 7-19 GM/118ML RE ENEM: 1.0000 | ENEMA | Freq: Every day | RECTAL | Status: DC | PRN

## 2016-05-29 MED ORDER — POLYVINYL ALCOHOL 1.4 % OP SOLN
1.0000 [drp] | Freq: Four times a day (QID) | OPHTHALMIC | Status: DC | PRN
  Filled 2016-05-29: qty 15

## 2016-05-29 MED ORDER — LORAZEPAM 1 MG PO TABS
1.0000 mg | ORAL_TABLET | ORAL | Status: DC | PRN
  Administered 2016-05-30: 1 mg via ORAL
  Filled 2016-05-29: qty 1

## 2016-05-29 MED ORDER — LORAZEPAM 2 MG/ML PO CONC: 1.0000 mg | ORAL | Status: DC | PRN

## 2016-05-29 MED ORDER — BIOTENE DRY MOUTH MT LIQD: 15.0000 mL | OROMUCOSAL | Status: DC | PRN

## 2016-05-29 MED ORDER — ONDANSETRON 4 MG PO TBDP: 4.0000 mg | ORAL_TABLET | Freq: Four times a day (QID) | ORAL | Status: DC | PRN

## 2016-05-29 MED ORDER — GLYCOPYRROLATE 1 MG PO TABS
1.0000 mg | ORAL_TABLET | ORAL | Status: DC | PRN
  Filled 2016-05-29: qty 1

## 2016-05-29 MED ORDER — GLYCOPYRROLATE 0.2 MG/ML IJ SOLN
0.2000 mg | INTRAMUSCULAR | Status: DC | PRN
  Filled 2016-05-29: qty 1

## 2016-05-29 MED ORDER — MORPHINE SULFATE (PF) 2 MG/ML IV SOLN
1.0000 mg | INTRAVENOUS | Status: DC | PRN
  Administered 2016-05-29 – 2016-05-30 (×3): 1 mg via INTRAVENOUS
  Filled 2016-05-29 (×3): qty 1

## 2016-05-29 MED ORDER — FUROSEMIDE 10 MG/ML IJ SOLN
40.0000 mg | Freq: Once | INTRAMUSCULAR | Status: AC
  Administered 2016-05-29: 40 mg via INTRAVENOUS
  Filled 2016-05-29: qty 4

## 2016-05-29 MED ORDER — DIPHENHYDRAMINE HCL 50 MG/ML IJ SOLN: 12.5000 mg | INTRAMUSCULAR | Status: DC | PRN

## 2016-05-29 MED ORDER — LORAZEPAM 2 MG/ML IJ SOLN: 1.0000 mg | INTRAMUSCULAR | Status: DC | PRN

## 2016-05-29 NOTE — Evaluation (Signed)
Clinical/Bedside Swallow Evaluation Patient Details  Name: Anthony Hutchinson MRN: [id number] Date of Birth: Feb 23, 1922  Today's Date: 05/29/2016 Time: SLP Start Time (ACUTE ONLY): 28 SLP Stop Time (ACUTE ONLY): 1250 SLP Time Calculation (min) (ACUTE ONLY): 30 min  Past Medical History:  Past Medical History:  Diagnosis Date  . Anemia    iron deficiency anemia  . Cancer ([place])   . Cataracts, bilateral   . CHF (congestive heart failure) ([place])   . CKD (chronic kidney disease)   . Complete heart block ([place])   . Diabetes mellitus without complication ([place])   . Hypertension   . Hypothyroidism    nodule  . Inguinal hernia   . LBBB (left bundle branch block)   . MDS (myelodysplastic syndrome) ([place]) 01/05/2014  . MDS (myelodysplastic syndrome) ([place])   . Peripheral vascular disease ([place])    Peripheral neuropathy  . Presence of permanent cardiac pacemaker    Past Surgical History:  Past Surgical History:  Procedure Laterality Date  . ABDOMINAL AORTIC ANEURYSM REPAIR    . ACROMIONECTOMY  1998   rotator cuff repair  . FLEXIBLE SIGMOIDOSCOPY N/A 12/13/2012   Procedure: FLEXIBLE SIGMOIDOSCOPY;  Surgeon: Beryle Beams, MD;  Location: [facility] ENDOSCOPY;  Service: Endoscopy;  Laterality: N/A;  . FLEXIBLE SIGMOIDOSCOPY N/A 03/12/2015   Procedure: FLEXIBLE SIGMOIDOSCOPY;  Surgeon: Carol Ada, MD;  Location: [facility];  Service: Endoscopy;  Laterality: N/A;  will need APC   . HERNIA REPAIR  1979   bilateral inguinal hernia  . HIATAL HERNIA REPAIR    . HOT HEMOSTASIS N/A 12/13/2012   Procedure: HOT HEMOSTASIS (ARGON PLASMA COAGULATION/BICAP);  Surgeon: Beryle Beams, MD;  Location: [facility];  Service: Endoscopy;  Laterality: N/A;  . HOT HEMOSTASIS N/A 03/12/2015   Procedure: HOT HEMOSTASIS (ARGON PLASMA COAGULATION/BICAP);  Surgeon: Carol Ada, MD;  Location: [facility];  Service: Endoscopy;  Laterality: N/A;  . IR GENERIC HISTORICAL  02/21/2016   IR US GUIDE VASC ACCESS RIGHT 02/21/2016 Arne Cleveland, MD [facility]  . IR GENERIC HISTORICAL  02/21/2016   IR FLUORO GUIDE CV LINE RIGHT 02/21/2016 Arne Cleveland, MD [facility]  . PACEMAKER INSERTION  11-03-13   STJ dual chamber pacemaker implanted by Dr Lovena Le for CHB  . PERMANENT PACEMAKER INSERTION N/A 11/03/2013   Procedure: PERMANENT PACEMAKER INSERTION;  Surgeon: Evans Lance, MD;  Location: [facility] CATH LAB;  Service: Cardiovascular;  Laterality: N/A;  . PROSTATE SURGERY    . THYROID LOBECTOMY  6/85   right  . TONSILLECTOMY    . TONSILLECTOMY     HPI:  Anthony Hutchinson is a 82 y.o. with h/o anemia, CA, CHF, DM, myelodysplastic syndrome of confusion for the past 1-2 weeks. Patient lives with his son who reports patient was last seen by PCP last week; they checked his urine and blood work which were negative. Son reported to MD that patient considered hospice last week but did not agree with stopping his blood transfusions which he gets weekly for anemia (last transfusion was 2 weeks ago). He was seen by PCP and started on Z-pac, cough medicine, and nasal spray due to sick contacts with sinus infection. Son states patient's coughing episodes are severe in nature and he does sometimes choke when eating/drinking if not sitting fully upright.   Pt denies GERD but does admits to taking medicine similar to Tums on occasion.  He uses a walker at baseline but lately has required assistance to wheelchair. Son state to MD that pt  had a UTI a few weeks ago which also caused patient to be confused.  Swallow evaluation ordered upon admit. Pt CXR showed worsening heart failure and right pleural effusion.     Assessment / Plan / Recommendation Clinical Impression  Pt with weak voice, xerostomic but denies dysphagia.  SLP can not rule out pharyngeal dysphagia clinically as pt with gross weakness and cough associated with intake.   Uncertain if secretion retention mobilized with intake causing cough, however rec to proceed with MBS to allow instrumental  swallow evaluation.  Son reports pt coughing at home - at times associated with intake - with liquids more than solids.  Son also states pt has not been able to swallow rice as it causes him to choke and expectorate.  Pt and son Anthony Hutchinson agreeable to proceed with MBS.      Aspiration Risk  Severe aspiration risk;Risk for inadequate nutrition/hydration    Diet Recommendation NPO except meds;Ice chips PRN after oral care   Medication Administration: Crushed with puree    Other  Recommendations     Follow up Recommendations        Frequency and Duration            Prognosis        Swallow Study   General Date of Onset: 05/29/16 HPI: Anthony Hutchinson is a 82 y.o. with h/o anemia, CA, CHF, DM, myelodysplastic syndrome of confusion for the past 1-2 weeks. Patient lives with his son who reports patient was last seen by PCP last week; they checked his urine and blood work which were negative. Son reported to MD that patient considered hospice last week but did not agree with stopping his blood transfusions which he gets weekly for anemia (last transfusion was 2 weeks ago). He was seen by PCP and started on Z-pac, cough medicine, and nasal spray due to sick contacts with sinus infection. Son states patient's coughing episodes are severe in nature and he does sometimes choke when eating/drinking if not sitting fully upright.   Pt denies GERD but does admits to taking medicine similar to Tums on occasion.  He uses a walker at baseline but lately has required assistance to wheelchair. Son state to MD that pt had a UTI a few weeks ago which also caused patient to be confused.  Swallow evaluation ordered upon admit. Pt CXR showed worsening heart failure and right pleural effusion.   Type of Study: Bedside Swallow Evaluation Diet Prior to this Study: NPO Temperature Spikes Noted: No Respiratory Status: Nasal cannula (pt on room air at home) History of Recent Intubation: No Behavior/Cognition:  Alert;Cooperative;Pleasant mood;Other (Comment) (HOH) Oral Cavity Assessment: Dry;Dried secretions Oral Care Completed by SLP: No Oral Cavity - Dentition: Poor condition (some dentition missing) Vision: Functional for self-feeding Self-Feeding Abilities: Needs assist Patient Positioning: Upright in bed Baseline Vocal Quality: Low vocal intensity Volitional Cough: Weak Volitional Swallow: Able to elicit    Oral/Motor/Sensory Function Overall Oral Motor/Sensory Function: Generalized oral weakness   Ice Chips Ice chips: Within functional limits   Thin Liquid Thin Liquid: Impaired Presentation: Spoon Oral Phase Impairments: Reduced lingual movement/coordination Oral Phase Functional Implications: Prolonged oral transit Pharyngeal  Phase Impairments: Multiple swallows    Nectar Thick Nectar Thick Liquid: Impaired Presentation: Cup Oral Phase Impairments: Reduced lingual movement/coordination Pharyngeal Phase Impairments: Suspected delayed Swallow;Multiple swallows;Cough - Delayed   Honey Thick     Puree Puree: Impaired Presentation: Spoon Oral Phase Impairments: Reduced lingual movement/coordination Oral Phase Functional Implications: Prolonged oral transit  Pharyngeal Phase Impairments: Multiple swallows Other Comments: sensation of residuals   Solid   GO   Solid: Not tested    Functional Assessment Tool Used: clinical judgement Functional Limitations: Swallowing Swallow Current Status [id number]): 100 percent impaired, limited or restricted Swallow Goal Status [id number]): At least 80 percent but less than 100 percent impaired, limited or restricted   Luanna Salk, [facility] SLP [phone]

## 2016-05-29 NOTE — Progress Notes (Signed)
MBS completed, full report to follow.  Pt presents with moderate oral and severe pharyngeal dysphagia with sensorimotor impairments.  Pt's swallow is grossly weak resulting in gross residuals with all consistencies tested.  Pt requires multiple swallows (3-4) to clear 1 tsp of liquid.  Aspiration *silent in nature* noted with thin, nectar and secretions without clearance despite cued dry swallows/cough.  Chin tuck posture and head turn left did not facilitate clearance.  Pt was able to cough/expectorate x2 during testing however gross residuals recurred with EACH swallow and pt's "hocking" performance was inconsistent.    Using live video, educated pt and son to findings and encouraged precautions.    IF pt desires po with risks known, thin liquids likely to clear better however dysphagia does not allow nutritional support at this time and pt will aspirate.    Using live video, educated pt and son to findings and encouraged precautions.     Son stated "he may need to see palliative care" due to pt medical decline, recent falls, UTI, deconditioning, etc.  Recommend consider palliative referral.   Luanna Salk, [facility] SLP   [phone]

## 2016-05-29 NOTE — H&P (Signed)
Report called to Wess Botts RN . Patient to be transferred to 1414. Son Marcene Brawn and made aware of transfer.

## 2016-05-29 NOTE — Progress Notes (Signed)
PT Cancellation Note / Screen  Patient Details Name: Anthony Hutchinson MRN: [id number] DOB: 1921-11-25   Cancelled Treatment:    Reason Eval/Treat Not Completed: PT screened, no needs identified, will sign off (MD, RN in room, states no PT at this time - palliative)   Saroya Riccobono,KATHrine E 05/29/2016, 3:03 PM Carmelia Bake, PT, DPT 05/29/2016 Pager: [phone]

## 2016-05-29 NOTE — Progress Notes (Signed)
  Echocardiogram 2D Echocardiogram has been performed.  Anthony Hutchinson 05/29/2016, 1:13 PM

## 2016-05-29 NOTE — Progress Notes (Addendum)
PROGRESS NOTE                                                                                                                                                                                                             Patient Demographics:    Anthony Hutchinson, is a 82 y.o. male, DOB - [birth year], [id number]  Admit date - 05/28/2016   Admitting Physician Albertine Patricia, MD  Outpatient Primary MD for the patient is Merrilee Seashore, MD  LOS - 0  Outpatient Specialists: Hematology Dr Alvy Bimler   Chief Complaint  Patient presents with  . Altered Mental Status  . Weakness       Brief Narrative    82 y.o.malewith a past medical history significant for myelodysplastic syndrome, CKD 4, CHF EF 45%, hypothyroidism, possible cirrhosis, history of prostate cancer with radiation proctitis, and CHBwith pacer, patient was admitted for failure to thrive, cup was significant for multifocal aspiration pneumonia due to severe dysphagia, as well acute on chronic systolic CHF.   Subjective:    Anthony Hutchinson today has, No headache, No chest pain, No abdominal pain - Reports he is uncomfortable, feels he is choking on his own secretions.  Assessment  & Plan :    Active Problems:   Anemia associated with chronic renal failure   Chronic systolic heart failure (HCC)   MDS (myelodysplastic syndrome) (HCC)   Thrombocytopenia (HCC)   Pacemaker   Stage III chronic kidney disease   Hypothyroidism   Altered mental state   Pneumonia   Pressure injury of skin  Failure to thrive/generalized weakness - Patient with progressive decline over the last 2 weeks, this is most likely due to infectious process, CHF and dysphagia. - consult PT consulted  Pneumonia - Most likely aspiration, giving dysphagia, admitted on the pneumonia pathway, w - Empirically IV vancomycin , [place] aztreonam, will discontinue IV vancomycin - Likely aspiration pneumonia given patient  with gross dysphagia   UTI - Continue with aztreonam  Dysphagia - Family report choking while eating, so SLP was consulted, patient severe pharyngeal dysphagia with sensorial motor impairment, will discuss with family goals of care. - We'll start on scopolamine and glycopyrrolate  Acute on chronic Systolic CHF - Chest x-ray with evidence of pulmonary edema, pleural effusion, BNP - Most recent echo in April 2015 EF 45-50 %, pretracheal showing improved EF  50-55% ,On Lasix when necessary - Continue with beta blockers and Lasix when necessary, ACEI/ARB given her renal failure  Hypothyroidism - Continue Synthroid  CKD stage IV - At baseline, continue to monitor as on when necessary diuresis  Anemia /MDS - This is secondary to MDS and CKD, transfuse as needed, recently transfused by Dr. Alvy Bimler as an outpatient, currently hemoglobin is stable  Thrombocytopenia - At baseline, continue to monitor, his SCD DVT prophylaxis,  Complete heart block - Status post pacer  Goal  Of care:Overall poor prognosis, poor life quality, patient appears to be significantly I'm comfortable secondary to dysphagia and aspiration , we discussed with son, will meet with HCP or a history or physical to known regarding cold discussion , they likely patient will need residential hospice as he has less than 2 weeks, and with significant symptoms requiring regiment that facility . Addendum: Case with patient, son, daughter and son-in-law, guarding goals of care, very poor prognosis, very poor life quality and discomfort, planned for comfort care,, social worker consulted for [employer] referral.  Code Status : DNR  Family Communication  : none at bedside  Disposition Plan  : pending further work up.  Consults  :  None  Procedures  : none  DVT Prophylaxis  :  SCDs, No chemical prophylaxis given thrombocytopenia and recurrent bleed in the past  Lab Results  Component Value Date   PLT 91 (L)  05/29/2016    Antibiotics  :    Anti-infectives    Start     Dose/Rate Route Frequency Ordered Stop   05/28/16 2200  metroNIDAZOLE (FLAGYL) IVPB 500 mg     500 mg 100 mL/hr over 60 Minutes Intravenous Every 8 hours 05/28/16 1809     05/28/16 2000  aztreonam (AZACTAM) 500 mg in dextrose 5 % 50 mL IVPB     500 mg 100 mL/hr over 30 Minutes Intravenous Every 8 hours 05/28/16 1752 06/05/16 1959   05/28/16 1830  vancomycin (VANCOCIN) IVPB 1000 mg/200 mL premix     1,000 mg 200 mL/hr over 60 Minutes Intravenous Every 48 hours 05/28/16 1809     05/28/16 1615  levofloxacin (LEVAQUIN) IVPB 500 mg     500 mg 100 mL/hr over 60 Minutes Intravenous  Once 05/28/16 1608 05/28/16 1900        Objective:   Vitals:   05/28/16 1945 05/28/16 2141 05/29/16 0532 05/29/16 0921  BP:  (!) 142/64 (!) 142/77 (!) 123/57  Pulse:  (!) 108 100   Resp:  16 18 20   Temp:  97 F (36.1 C) 97.6 F (36.4 C) 98 F (36.7 C)  TempSrc:  Oral Axillary Oral  SpO2: 94% 94% 96% 95%  Weight:      Height:        Wt Readings from Last 3 Encounters:  05/28/16 72.3 kg (159 lb 6.3 oz)  05/18/16 73 kg (160 lb 14.4 oz)  04/07/16 77.7 kg (171 lb 4.8 oz)     Intake/Output Summary (Last 24 hours) at 05/29/16 1333 Last data filed at 05/29/16 1131  Gross per 24 hour  Intake              690 ml  Output             1825 ml  Net            -1135 ml     Physical Exam  Awake Alert,Frail, chronically ill-appearing Supple Neck,No JVD. Symmetrical Chest wall movement, fair air movement  bilaterally, no wheezing, scattered rails RRR,No Gallops,Rubs or new Murmurs, No Parasternal Heave +ve B.Sounds, Abd Soft, No tenderness, No organomegaly appriciated,  No Cyanosis, Clubbing , No new Rash or bruise  , mild pitting edema    Data Review:    CBC  Recent Labs Lab 05/28/16 1428 05/29/16 0346  WBC 11.1* 9.3  HGB 8.8* 8.6*  HCT 27.2* 26.8*  PLT 110* 91*  MCV 91.6 92.4  MCH 29.6 29.7  MCHC 32.4 32.1  RDW 21.5*  21.7*  LYMPHSABS 1.1  --   MONOABS 0.8  --   EOSABS 0.1  --   BASOSABS 0.0  --     Chemistries   Recent Labs Lab 05/28/16 1428 05/29/16 0346  NA 135 134*  K 4.3 4.2  CL 102 101  CO2 26 26  GLUCOSE 118* 122*  BUN 33* 35*  CREATININE 2.08* 2.28*  CALCIUM 8.4* 8.2*   ------------------------------------------------------------------------------------------------------------------ No results for input(s): CHOL, HDL, LDLCALC, TRIG, CHOLHDL, LDLDIRECT in the last 72 hours.  No results found for: HGBA1C ------------------------------------------------------------------------------------------------------------------ No results for input(s): TSH, T4TOTAL, T3FREE, THYROIDAB in the last 72 hours.  Invalid input(s): FREET3 ------------------------------------------------------------------------------------------------------------------ No results for input(s): VITAMINB12, FOLATE, FERRITIN, TIBC, IRON, RETICCTPCT in the last 72 hours.  Coagulation profile No results for input(s): INR, PROTIME in the last 168 hours.  No results for input(s): DDIMER in the last 72 hours.  Cardiac Enzymes No results for input(s): CKMB, TROPONINI, MYOGLOBIN in the last 168 hours.  Invalid input(s): CK ------------------------------------------------------------------------------------------------------------------    Component Value Date/Time   BNP 145.9 (H) 05/28/2016 1646    Inpatient Medications  Scheduled Meds: . alfuzosin  10 mg Oral QPM  . ALPRAZolam  0.25 mg Oral BID  . aztreonam  500 mg Intravenous Q8H  . fluticasone  2 spray Each Nare Daily  . levothyroxine  125 mcg Oral QAC breakfast  . metoprolol succinate  25 mg Oral Daily  . metronidazole  500 mg Intravenous Q8H  . multivitamin with minerals  1 tablet Oral q morning - 10a  . vancomycin  1,000 mg Intravenous Q48H   Continuous Infusions: PRN Meds:.acetaminophen, calcium carbonate, hydrocortisone, loperamide  Micro  Results No results found for this or any previous visit (from the past 240 hour(s)).  Radiology Reports Dg Chest 2 View  Result Date: 05/28/2016 CLINICAL DATA:  Dyspnea.  Cough.  Aspiration. EXAM: CHEST  2 VIEW COMPARISON:  04/04/2016 FINDINGS: Lateral view degraded by patient arm position. Patient rotated to the left. Right sided Port-A-Cath terminates at the low SVC. Dual lead pacer. Cardiomegaly accentuated by AP portable technique. Small bilateral pleural effusions. No pneumothorax. Interstitial prominence is greater on the right than left and not significantly changed. Bilateral airspace opacities, including within the right upper and both lower lobes. The right upper lobe opacity is a somewhat nodular in appearance and is in the region of support apparatus artifact. IMPRESSION: congestive heart failure with small bilateral pleural effusions. Multifocal airspace opacities which could represent alveolar edema or infection/ aspiration. Apparent nodular component of the right upper lobe opacity which could be partially artifactual. Recommend attention on follow-up. Electronically Signed   By: Abigail Miyamoto M.D.   On: 05/28/2016 14:54   Dg Chest Port 1 View  Result Date: 05/29/2016 CLINICAL DATA:  Cough. EXAM: PORTABLE CHEST 1 VIEW COMPARISON:  05/28/2016. FINDINGS: PowerPort catheter and stable position. Cardiac pacer with lead tips in right atrium right ventricle. Cardiomegaly with bilateral pulmonary vascular prominence interstitial prominence with bilateral pleural effusions consistent congestive  heart failure. Findings progressed from prior exam. Pneumonitis cannot be excluded. No pneumothorax. IMPRESSION: 1.  PowerPort catheter noted in stable position.  A 2. Cardiac pacer stable position. Cardiomegaly with pulmonary venous congestion and bilateral interstitial prominence with right pleural effusion. Findings consistent congestive heart failure. Findings progressed from prior exam. Electronically  Signed   By: Marcello Moores  Register   On: 05/29/2016 06:55     Waldron Labs, Anthony Hutchinson M.D on 05/29/2016 at 1:33 PM  Between 7am to 7pm - Pager - [phone]  After 7pm go to [email or web address] - password [email or web address]  [facility] -  Office  [phone]

## 2016-05-29 NOTE — Progress Notes (Signed)
CSW received consult for residential hospice placement. Dr. Waldron Labs confirmed with patient's daughter at bedside that [facility] is their first choice. CSW made referral to Erling Conte, [place] Liaison - awaiting response re: bed availability/eligbility.    Raynaldo Opitz, [facility] Clinical Social Worker cell #: [phone]

## 2016-05-30 DIAGNOSIS — Z515 Encounter for palliative care: Secondary | ICD-10-CM

## 2016-05-30 DIAGNOSIS — Z7189 Other specified counseling: Secondary | ICD-10-CM

## 2016-05-30 DIAGNOSIS — R410 Disorientation, unspecified: Secondary | ICD-10-CM

## 2016-05-30 MED ORDER — HALOPERIDOL LACTATE 5 MG/ML IJ SOLN: 1.0000 mg | INTRAMUSCULAR | Status: DC | PRN

## 2016-05-30 MED ORDER — HALOPERIDOL LACTATE 2 MG/ML PO CONC: 0.5000 mg | ORAL | 0 refills | Status: AC | PRN

## 2016-05-30 MED ORDER — HALOPERIDOL LACTATE 5 MG/ML IJ SOLN: 0.5000 mg | INTRAMUSCULAR | Status: DC | PRN

## 2016-05-30 MED ORDER — HALOPERIDOL LACTATE 2 MG/ML PO CONC
0.5000 mg | ORAL | Status: DC | PRN
  Filled 2016-05-30: qty 0.3

## 2016-05-30 MED ORDER — HALOPERIDOL LACTATE 5 MG/ML IJ SOLN: 1.0000 mg | Freq: Four times a day (QID) | INTRAMUSCULAR | Status: DC | PRN

## 2016-05-30 MED ORDER — SODIUM CHLORIDE 0.9% FLUSH: 3.0000 mL | Freq: Two times a day (BID) | INTRAVENOUS | Status: DC

## 2016-05-30 MED ORDER — POLYVINYL ALCOHOL 1.4 % OP SOLN: 1.0000 [drp] | Freq: Four times a day (QID) | OPHTHALMIC | 0 refills | Status: AC | PRN

## 2016-05-30 MED ORDER — ALBUTEROL SULFATE (2.5 MG/3ML) 0.083% IN NEBU: 2.5000 mg | INHALATION_SOLUTION | RESPIRATORY_TRACT | 12 refills | Status: AC | PRN

## 2016-05-30 MED ORDER — MORPHINE SULFATE (PF) 2 MG/ML IV SOLN: 1.0000 mg | INTRAVENOUS | 0 refills | Status: AC | PRN

## 2016-05-30 MED ORDER — SODIUM CHLORIDE 0.9% FLUSH: 10.0000 mL | Freq: Two times a day (BID) | INTRAVENOUS | Status: DC

## 2016-05-30 MED ORDER — SODIUM CHLORIDE 0.9% FLUSH: 10.0000 mL | INTRAVENOUS | Status: DC | PRN

## 2016-05-30 MED ORDER — HALOPERIDOL 0.5 MG PO TABS
0.5000 mg | ORAL_TABLET | ORAL | Status: DC | PRN
  Filled 2016-05-30: qty 1

## 2016-05-30 MED ORDER — GLYCOPYRROLATE 0.2 MG/ML IJ SOLN: 0.2000 mg | INTRAMUSCULAR | Status: AC | PRN

## 2016-05-30 MED ORDER — SODIUM CHLORIDE 0.9% FLUSH: 3.0000 mL | INTRAVENOUS | Status: DC | PRN

## 2016-05-30 MED ORDER — SODIUM CHLORIDE 0.9 % IV SOLN: 250.0000 mL | INTRAVENOUS | Status: DC | PRN

## 2016-05-30 MED ORDER — BIOTENE DRY MOUTH MT LIQD: 15.0000 mL | OROMUCOSAL | Status: AC | PRN

## 2016-05-30 MED ORDER — ACETAMINOPHEN 325 MG PO TABS: 650.0000 mg | ORAL_TABLET | Freq: Four times a day (QID) | ORAL | Status: AC | PRN

## 2016-05-30 MED ORDER — HALOPERIDOL 0.5 MG PO TABS: 0.5000 mg | ORAL_TABLET | ORAL | Status: AC | PRN

## 2016-05-30 MED ORDER — HEPARIN SOD (PORK) LOCK FLUSH 100 UNIT/ML IV SOLN
500.0000 [IU] | INTRAVENOUS | Status: AC | PRN
  Administered 2016-05-30: 500 [IU]

## 2016-05-30 MED ORDER — GLYCOPYRROLATE 1 MG PO TABS: 1.0000 mg | ORAL_TABLET | ORAL | Status: AC | PRN

## 2016-05-30 MED ORDER — DIPHENHYDRAMINE HCL 50 MG/ML IJ SOLN: 12.5000 mg | INTRAMUSCULAR | 0 refills | Status: AC | PRN

## 2016-05-30 NOTE — Progress Notes (Signed)
Call placed to [facility] [phone], to turn pacemaker device off per physician order. Awaiting call from local representative Windle Guard.

## 2016-05-30 NOTE — Plan of Care (Signed)
Problem: Education: Goal: Knowledge of [employer] [place] [facility] Education information/materials will improve Outcome: Completed/Met Date Met: 05/30/16 Discussed continued plan of care with patients son  Problem: Safety: Goal: Ability to remain free from injury will improve Outcome: Completed/Met Date Met: 05/30/16 Patient is being monitored closely. Bed alarm set

## 2016-05-30 NOTE — Progress Notes (Signed)
Report called to Barnett Applebaum, RN at [facility] [phone]. Questions, concerns denied

## 2016-05-30 NOTE — Progress Notes (Signed)
Unfortunately, no bed available at [facility]. CSW spoke with patient's son, [name] at bedside & daughter/POA, Thayer Headings via phone (ph#: [phone]) who is agreeable with checking with [facility]. CSW made referral to Manus Gunning at [facility] & will await call back re: bed availability/eligbility.    Raynaldo Opitz, [facility] Clinical Social Worker cell #: [phone]

## 2016-05-30 NOTE — Progress Notes (Signed)
Patient is set to discharge to [facility] today. Patient & family at bedside aware. Discharge packet given to RN, [employer]. [facility] called for transport.     Raynaldo Opitz, [facility] Clinical Social Worker cell #: [phone]

## 2016-05-30 NOTE — Discharge Summary (Signed)
Anthony Hutchinson, is a 82 y.o. male  DOB 05-Nov-1921  MRN [id number].  Admission date:  05/28/2016  Admitting Physician  Albertine Patricia, MD  Discharge Date:  05/30/2016   Primary MD  Merrilee Seashore, MD  Recommendations for accepting facility:   Management per [facility] - patient is NPO for sever dysphagia, may require frequent suctioning, they have ice chips if able to tolerate  Admission Diagnosis  Cough [R05]   Discharge Diagnosis  Cough [R05]    Active Problems:   Anemia associated with chronic renal failure   Chronic systolic heart failure (HCC)   MDS (myelodysplastic syndrome) (HCC)   Thrombocytopenia (HCC)   Pacemaker   Stage III chronic kidney disease   Hypothyroidism   Altered mental state   Pneumonia   Pressure injury of skin   Delirium   Encounter for hospice care discussion   Goals of care, counseling/discussion      Past Medical History:  Diagnosis Date  . Anemia    iron deficiency anemia  . Cancer ([place])   . Cataracts, bilateral   . CHF (congestive heart failure) ([place])   . CKD (chronic kidney disease)   . Complete heart block ([place])   . Diabetes mellitus without complication ([place])   . Hypertension   . Hypothyroidism    nodule  . Inguinal hernia   . LBBB (left bundle branch block)   . MDS (myelodysplastic syndrome) ([place]) 01/05/2014  . MDS (myelodysplastic syndrome) ([place])   . Peripheral vascular disease ([place])    Peripheral neuropathy  . Presence of permanent cardiac pacemaker     Past Surgical History:  Procedure Laterality Date  . ABDOMINAL AORTIC ANEURYSM REPAIR    . ACROMIONECTOMY  1998   rotator cuff repair  . FLEXIBLE SIGMOIDOSCOPY N/A 12/13/2012   Procedure: FLEXIBLE SIGMOIDOSCOPY;  Surgeon: Beryle Beams, MD;  Location: [facility];  Service: Endoscopy;  Laterality: N/A;  . FLEXIBLE SIGMOIDOSCOPY N/A 03/12/2015   Procedure: FLEXIBLE  SIGMOIDOSCOPY;  Surgeon: Carol Ada, MD;  Location: [facility];  Service: Endoscopy;  Laterality: N/A;  will need APC   . HERNIA REPAIR  1979   bilateral inguinal hernia  . HIATAL HERNIA REPAIR    . HOT HEMOSTASIS N/A 12/13/2012   Procedure: HOT HEMOSTASIS (ARGON PLASMA COAGULATION/BICAP);  Surgeon: Beryle Beams, MD;  Location: [facility];  Service: Endoscopy;  Laterality: N/A;  . HOT HEMOSTASIS N/A 03/12/2015   Procedure: HOT HEMOSTASIS (ARGON PLASMA COAGULATION/BICAP);  Surgeon: Carol Ada, MD;  Location: [facility];  Service: Endoscopy;  Laterality: N/A;  . IR GENERIC HISTORICAL  02/21/2016   IR US GUIDE VASC ACCESS RIGHT 02/21/2016 Arne Cleveland, MD [facility]  . IR GENERIC HISTORICAL  02/21/2016   IR FLUORO GUIDE CV LINE RIGHT 02/21/2016 Arne Cleveland, MD [facility]  . PACEMAKER INSERTION  11-03-13   STJ dual chamber pacemaker implanted by Dr Lovena Le for CHB  . PERMANENT PACEMAKER INSERTION N/A 11/03/2013   Procedure: PERMANENT PACEMAKER INSERTION;  Surgeon: Evans Lance, MD;  Location: [facility]  CATH LAB;  Service: Cardiovascular;  Laterality: N/A;  . PROSTATE SURGERY    . THYROID LOBECTOMY  6/85   right  . TONSILLECTOMY    . TONSILLECTOMY         History of present illness and  Hospital Course:     Kindly see H&P for history of present illness and admission details, please review complete Labs, Consult reports and Test reports for all details in brief  HPI  from the history and physical done on the day of admission 05/28/2016 Anthony Hutchinson  is a 82 y.o. male, 82 y.o.malewith a past medical history significant for myelodysplastic syndrome, CKD 4, CHF EF 45%, hypothyroidism, possible cirrhosis, history of prostate cancer with radiation proctitis, and CHBwith pacer. Patient was brought by his son for multiple complaints, including poor progressive weakness, increased lethargy, decreased appetite and failure to thrive over the last couple weeks, as well patient reports  progressive dyspnea, cough, productive, blood-tinged, denies any dysuria or polyuria, chest pain buster per rectum or coffee-ground emesis nausea or vomiting. Patient with known history of MDS, requiring frequent transfusions managed by Dr. Alvy Bimler - in ED chest x-ray was significant for multiple opacities represent edema versus pneumonia, creatinine 2.08 which is baseline, hemoglobin 8.8 (baseline is 7.5, but he received transfusion last week) he is afebrile, with a leukocytosis of 11.1   Hospital Course  82 y.o.malewith a past medical history significant for myelodysplastic syndrome, CKD 4, CHF EF 45%, hypothyroidism, possible cirrhosis, history of prostate cancer with radiation proctitis, and CHBwith pacer, patient was admitted for failure to thrive, workup was significant for multifocal aspiration pneumonia due to severe dysphagia, as well acute on chronic systolic CHF. Basal P, patient with severe dysphagia, of ear discomfort due to choking oral secretion, and a family meeting with son and daughter, decision was made for comfort care.  Failure to thrive/generalized weakness - Patient with progressive decline over the last 2 weeks, this is most likely due to infectious process, CHF and dysphagia.  Pneumonia - No further antibiotics   UTI - No further antibiotics  Dysphagia - Severe oropharyngeal dysphagia, with significant choking sensation per patient, patient has been made comfort care, on scopolamine and glycopyrrolate when necessary  Acute on chronic Systolic CHF - Chest x-ray with evidence of pulmonary edema,pleural effusion, BNP - Most recent echo in April 2015 EF 45-50 %, pretracheal showing improved EF 50-55% ,On Lasix when necessary Hypothyroidism CKD stage IV Anemia /MDS Thrombocytopenia Complete heart block    Discharge Condition:  < 2weeks  Goals of care  discussed with patient, son, daughter and son-in-law, guarding goals of care, very poor prognosis,  very poor life quality and discomfort, planned for comfort care, palliative medicine consult appreciated, discharge for [facility] hospice today  Discharge Instructions  and  Discharge Medications    Discharge Instructions    Discharge instructions    Complete by:  As directed    Management per [facility] - patient is NPO for sever dysphagia, may require frequent suctioning, they have ice chips if able to tolerate       Medication List    STOP taking these medications   alfuzosin 10 MG 24 hr tablet Commonly known as:  UROXATRAL   ALPRAZolam 0.25 MG tablet Commonly known as:  XANAX   aminocaproic acid 500 MG tablet Commonly known as:  AMICAR   azithromycin 250 MG tablet Commonly known as:  ZITHROMAX   calcium carbonate 500 MG chewable tablet Commonly known as:  TUMS - dosed  in mg elemental calcium   fluticasone 50 MCG/ACT nasal spray Commonly known as:  FLONASE   furosemide 20 MG tablet Commonly known as:  LASIX   hydrocortisone 25 MG suppository Commonly known as:  ANUSOL-HC   lidocaine-prilocaine cream Commonly known as:  EMLA   loperamide 2 MG capsule Commonly known as:  IMODIUM   multivitamin with minerals Tabs tablet   SYNTHROID 125 MCG tablet Generic drug:  levothyroxine   TOPROL XL 25 MG 24 hr tablet Generic drug:  metoprolol succinate     TAKE these medications   acetaminophen 325 MG tablet Commonly known as:  TYLENOL Take 2 tablets (650 mg total) by mouth every 6 (six) hours as needed for mild pain (or Fever >/= 101). What changed:  medication strength  how much to take  reasons to take this   albuterol (2.5 MG/3ML) 0.083% nebulizer solution Commonly known as:  PROVENTIL Take 3 mLs (2.5 mg total) by nebulization every 2 (two) hours as needed for wheezing.   antiseptic oral rinse Liqd Apply 15 mLs topically as needed for dry mouth.   diphenhydrAMINE 50 MG/ML injection Commonly known as:  BENADRYL Inject 0.25 mLs (12.5 mg total)  into the vein every 4 (four) hours as needed for itching.   glycopyrrolate 1 MG tablet Commonly known as:  ROBINUL Take 1 tablet (1 mg total) by mouth every 4 (four) hours as needed (excessive secretions).   glycopyrrolate 0.2 MG/ML injection Commonly known as:  ROBINUL Inject 1 mL (0.2 mg total) into the skin every 4 (four) hours as needed (excessive secretions).   haloperidol 0.5 MG tablet Commonly known as:  HALDOL Take 1 tablet (0.5 mg total) by mouth every 4 (four) hours as needed for agitation (or delirium).   haloperidol 2 MG/ML solution Commonly known as:  HALDOL Place 0.3 mLs (0.6 mg total) under the tongue every 4 (four) hours as needed for agitation (or delirium).   morphine 2 MG/ML injection Inject 0.5 mLs (1 mg total) into the vein every 2 (two) hours as needed (or dyspnea).   polyvinyl alcohol 1.4 % ophthalmic solution Commonly known as:  LIQUIFILM TEARS Place 1 drop into both eyes 4 (four) times daily as needed for dry eyes.         Diet and Activity recommendation: See Discharge Instructions above   Consults obtained -  palliative   Major procedures and Radiology Reports - PLEASE review detailed and final reports for all details, in brief -      Dg Chest 2 View  Result Date: 05/28/2016 CLINICAL DATA:  Dyspnea.  Cough.  Aspiration. EXAM: CHEST  2 VIEW COMPARISON:  04/04/2016 FINDINGS: Lateral view degraded by patient arm position. Patient rotated to the left. Right sided Port-A-Cath terminates at the low SVC. Dual lead pacer. Cardiomegaly accentuated by AP portable technique. Small bilateral pleural effusions. No pneumothorax. Interstitial prominence is greater on the right than left and not significantly changed. Bilateral airspace opacities, including within the right upper and both lower lobes. The right upper lobe opacity is a somewhat nodular in appearance and is in the region of support apparatus artifact. IMPRESSION: congestive heart failure with  small bilateral pleural effusions. Multifocal airspace opacities which could represent alveolar edema or infection/ aspiration. Apparent nodular component of the right upper lobe opacity which could be partially artifactual. Recommend attention on follow-up. Electronically Signed   By: Abigail Miyamoto M.D.   On: 05/28/2016 14:54   Dg Chest Port 1 View  Result Date: 05/29/2016 CLINICAL  DATA:  Cough. EXAM: PORTABLE CHEST 1 VIEW COMPARISON:  05/28/2016. FINDINGS: PowerPort catheter and stable position. Cardiac pacer with lead tips in right atrium right ventricle. Cardiomegaly with bilateral pulmonary vascular prominence interstitial prominence with bilateral pleural effusions consistent congestive heart failure. Findings progressed from prior exam. Pneumonitis cannot be excluded. No pneumothorax. IMPRESSION: 1.  PowerPort catheter noted in stable position.  A 2. Cardiac pacer stable position. Cardiomegaly with pulmonary venous congestion and bilateral interstitial prominence with right pleural effusion. Findings consistent congestive heart failure. Findings progressed from prior exam. Electronically Signed   By: Marcello Moores  Register   On: 05/29/2016 06:55   Dg Swallowing Func-speech Pathology  Result Date: 05/29/2016 Objective Swallowing Evaluation: Type of Study: MBS-Modified Barium Swallow Study Patient Details Name: SHASHANK BARRANCO MRN: [id number] Date of Birth: 1921-08-12 Today's Date: 05/29/2016 Time: SLP Start Time (ACUTE ONLY): 1346-SLP Stop Time (ACUTE ONLY): 1420 SLP Time Calculation (min) (ACUTE ONLY): 34 min Past Medical History: Past Medical History: Diagnosis Date . Anemia   iron deficiency anemia . Cancer ([place])  . Cataracts, bilateral  . CHF (congestive heart failure) ([place])  . CKD (chronic kidney disease)  . Complete heart block ([place])  . Diabetes mellitus without complication ([place])  . Hypertension  . Hypothyroidism   nodule . Inguinal hernia  . LBBB (left bundle branch block)  . MDS (myelodysplastic  syndrome) ([place]) 01/05/2014 . MDS (myelodysplastic syndrome) ([place])  . Peripheral vascular disease ([place])   Peripheral neuropathy . Presence of permanent cardiac pacemaker  Past Surgical History: Past Surgical History: Procedure Laterality Date . ABDOMINAL AORTIC ANEURYSM REPAIR   . ACROMIONECTOMY  1998  rotator cuff repair . FLEXIBLE SIGMOIDOSCOPY N/A 12/13/2012  Procedure: FLEXIBLE SIGMOIDOSCOPY;  Surgeon: Beryle Beams, MD;  [facility];  Service: Endoscopy;  Laterality: N/A; . FLEXIBLE SIGMOIDOSCOPY N/A 03/12/2015  Procedure: FLEXIBLE SIGMOIDOSCOPY;  Surgeon: Carol Ada, MD;  [facility];  Service: Endoscopy;  Laterality: N/A;  will need APC  . HERNIA REPAIR  1979  bilateral inguinal hernia . HIATAL HERNIA REPAIR   . HOT HEMOSTASIS N/A 12/13/2012  Procedure: HOT HEMOSTASIS (ARGON PLASMA COAGULATION/BICAP);  Surgeon: Beryle Beams, MD;  Location: [facility];  Service: Endoscopy;  Laterality: N/A; . HOT HEMOSTASIS N/A 03/12/2015  Procedure: HOT HEMOSTASIS (ARGON PLASMA COAGULATION/BICAP);  Surgeon: Carol Ada, MD;  Location: [facility];  Service: Endoscopy;  Laterality: N/A; . IR GENERIC HISTORICAL  02/21/2016  IR US GUIDE VASC ACCESS RIGHT 02/21/2016 Arne Cleveland, MD [facility] . IR GENERIC HISTORICAL  02/21/2016  IR FLUORO GUIDE CV LINE RIGHT 02/21/2016 Arne Cleveland, MD [facility] . PACEMAKER INSERTION  11-03-13  STJ dual chamber pacemaker implanted by Dr Lovena Le for CHB . PERMANENT PACEMAKER INSERTION N/A 11/03/2013  Procedure: PERMANENT PACEMAKER INSERTION;  Surgeon: Evans Lance, MD;  Location: [facility] CATH LAB;  Service: Cardiovascular;  Laterality: N/A; . PROSTATE SURGERY   . THYROID LOBECTOMY  6/85  right . TONSILLECTOMY   . TONSILLECTOMY   HPI: JAQUA MEAR is a 82 y.o. with h/o anemia, CA, CHF, DM, myelodysplastic syndrome of confusion for the past 1-2 weeks. Patient lives with his son who reports patient was last seen by PCP last week; they checked his urine and blood work which  were negative. Son reported to MD that patient considered hospice last week but did not agree with stopping his blood transfusions which he gets weekly for anemia (last transfusion was 2 weeks ago). He was seen by PCP and started on Z-pac, cough medicine, and  nasal spray due to sick contacts with sinus infection. Son states patient's coughing episodes are severe in nature and he does sometimes choke when eating/drinking if not sitting fully upright.   Pt denies GERD but does admits to taking medicine similar to Tums on occasion.  He uses a walker at baseline but lately has required assistance to wheelchair. Son state to MD that pt had a UTI a few weeks ago which also caused patient to be confused.  Swallow evaluation ordered upon admit. Pt CXR showed worsening heart failure and right pleural effusion.   Subjective: pt awake in bed, son arrived during session Assessment / Plan / Recommendation CHL IP CLINICAL IMPRESSIONS 05/29/2016 Therapy Diagnosis Moderate oral phase dysphagia;Severe pharyngeal phase dysphagia;Moderate cervical esophageal phase dysphagia Clinical Impression Pt presents with moderate oral and severe pharyngeal dysphagia with sensorimotor impairments.  Pt's swallow is grossly weak resulting in gross residuals with all consistencies tested.  Pt requires multiple swallows (3-4) to clear 1 tsp of liquid.  Aspiration *silent in nature* noted with thin, nectar and secretions without clearance despite cued dry swallows/cough.  Chin tuck posture and head turn left did not facilitate clearance.  Pt was able to cough/expectorate x2 during testing however gross residuals recurred with EACH swallow and pt's "hocking" performance was inconsistent.    Using live video, educated pt and son to findings and encouraged precautions.   Impact on safety and function Severe aspiration risk;Risk for inadequate nutrition/hydration   CHL IP TREATMENT RECOMMENDATION 05/29/2016 Treatment Recommendations Therapy as outlined in  treatment plan below   Prognosis 05/29/2016 Prognosis for Safe Diet Advancement Guarded Barriers to Reach Goals Severity of deficits Barriers/Prognosis Comment -- CHL IP DIET RECOMMENDATION 05/29/2016 SLP Diet Recommendations NPO Liquid Administration via -- Medication Administration -- Compensations Small sips/bites;Slow rate;Multiple dry swallows after each bite/sip Postural Changes Seated upright at 90 degrees;Remain semi-upright after after feeds/meals (Comment)   CHL IP OTHER RECOMMENDATIONS 05/29/2016 Recommended Consults -- Oral Care Recommendations Oral care BID Other Recommendations --   CHL IP FOLLOW UP RECOMMENDATIONS 05/29/2016 Follow up Recommendations None   CHL IP FREQUENCY AND DURATION 05/29/2016 Speech Therapy Frequency (ACUTE ONLY) min 1 x/week Treatment Duration 1 week      CHL IP ORAL PHASE 05/29/2016 Oral Phase Impaired Oral - Pudding Teaspoon -- Oral - Pudding Cup -- Oral - Honey Teaspoon -- Oral - Honey Cup -- Oral - Nectar Teaspoon Weak lingual manipulation;Reduced posterior propulsion;Delayed oral transit Oral - Nectar Cup Weak lingual manipulation;Reduced posterior propulsion;Delayed oral transit Oral - Nectar Straw -- Oral - Thin Teaspoon Weak lingual manipulation;Reduced posterior propulsion;Delayed oral transit Oral - Thin Cup Weak lingual manipulation;Reduced posterior propulsion;Delayed oral transit Oral - Thin Straw -- Oral - Puree Weak lingual manipulation;Reduced posterior propulsion;Delayed oral transit Oral - Mech Soft -- Oral - Regular -- Oral - Multi-Consistency -- Oral - Pill -- Oral Phase - Comment --  CHL IP PHARYNGEAL PHASE 05/29/2016 Pharyngeal Phase Impaired Pharyngeal- Pudding Teaspoon -- Pharyngeal -- Pharyngeal- Pudding Cup -- Pharyngeal -- Pharyngeal- Honey Teaspoon -- Pharyngeal -- Pharyngeal- Honey Cup -- Pharyngeal -- Pharyngeal- Nectar Teaspoon Delayed swallow initiation-vallecula;Reduced pharyngeal peristalsis;Reduced epiglottic inversion;Reduced anterior  laryngeal mobility;Reduced laryngeal elevation;Reduced airway/laryngeal closure;Reduced tongue base retraction;Pharyngeal residue - valleculae;Lateral channel residue Pharyngeal -- Pharyngeal- Nectar Cup Delayed swallow initiation-vallecula;Reduced pharyngeal peristalsis;Reduced epiglottic inversion;Reduced anterior laryngeal mobility;Reduced laryngeal elevation;Reduced airway/laryngeal closure;Penetration/Aspiration before swallow;Pharyngeal residue - valleculae;Lateral channel residue Pharyngeal -- Pharyngeal- Nectar Straw -- Pharyngeal -- Pharyngeal- Thin Teaspoon Delayed swallow initiation-vallecula;Reduced pharyngeal peristalsis;Reduced epiglottic inversion;Reduced anterior laryngeal mobility;Reduced laryngeal  elevation;Reduced tongue base retraction;Penetration/Aspiration during swallow;Penetration/Apiration after swallow;Trace aspiration;Pharyngeal residue - valleculae;Lateral channel residue Pharyngeal Material enters airway, CONTACTS cords and not ejected out;Material enters airway, passes BELOW cords without attempt by patient to eject out (silent aspiration) Pharyngeal- Thin Cup Delayed swallow initiation-vallecula;Reduced pharyngeal peristalsis;Reduced epiglottic inversion;Reduced anterior laryngeal mobility;Reduced laryngeal elevation;Reduced airway/laryngeal closure;Reduced tongue base retraction;Significant aspiration (Amount);Pharyngeal residue - valleculae;Lateral channel residue Pharyngeal -- Pharyngeal- Thin Straw -- Pharyngeal -- Pharyngeal- Puree Delayed swallow initiation-vallecula;Reduced pharyngeal peristalsis;Reduced epiglottic inversion;Reduced anterior laryngeal mobility;Reduced laryngeal elevation;Reduced airway/laryngeal closure;Reduced tongue base retraction;Pharyngeal residue - valleculae Pharyngeal -- Pharyngeal- Mechanical Soft -- Pharyngeal -- Pharyngeal- Regular -- Pharyngeal -- Pharyngeal- Multi-consistency -- Pharyngeal -- Pharyngeal- Pill -- Pharyngeal -- Pharyngeal Comment chin  tuck posture with and without head turn left did not faciliate clearance, pt does not consistently sense pharyngeal residuals, cued cough/"hock" helped clear vallecular residuals x2 during testing but pt could not consistentely perform as it was exhausting for him, dry swallows weak but aided clearance marginally  CHL IP CERVICAL ESOPHAGEAL PHASE 05/29/2016 Cervical Esophageal Phase Impaired Pudding Teaspoon -- Pudding Cup -- Honey Teaspoon -- Honey Cup -- Nectar Teaspoon -- Nectar Cup -- Nectar Straw -- Thin Teaspoon -- Thin Cup -- Thin Straw -- Puree -- Mechanical Soft -- Regular -- Multi-consistency -- Pill -- Cervical Esophageal Comment -- CHL IP GO 05/29/2016 Functional Assessment Tool Used mbs, clinical judgement Functional Limitations Swallowing Swallow Current Status [id number]) CM Swallow Goal Status [id number]) CM Swallow Discharge Status [id number]) (None) Motor Speech Current Status [id number]) (None) Motor Speech Goal Status UK:060616) (None) Motor Speech Goal Status SA:931536) (None) Spoken Language Comprehension Current Status [id number]) (None) Spoken Language Comprehension Goal Status [id number]) (None) Spoken Language Comprehension Discharge Status [id number]) (None) Spoken Language Expression Current Status FP:837989) (None) Spoken Language Expression Goal Status [id number]) (None) Spoken Language Expression Discharge Status NF:1565649) (None) Attention Current Status [id number]) (None) Attention Goal Status EY:7266000) (None) Attention Discharge Status [id number]) (None) Memory Current Status YL:3545582) (None) Memory Goal Status [id number]) (None) Memory Discharge Status QC:115444) (None) Voice Current Status [id number]) (None) Voice Goal Status [id number]:8517110) (None) Voice Discharge Status [id number]) (None) Other Speech-Language Pathology Functional Limitation [id number]:978821) (None) Other Speech-Language Pathology Functional Limitation Goal Status [id number]) (None) Other Speech-Language Pathology Functional Limitation Discharge Status [phone]) (None) Claudie Fisherman,  [facility] SLP [phone]               Micro Results     No results found for this or any previous visit (from the past 240 hour(s)).     Today   Subjective:   Kayzen Martire today Is lethargic, unable to provide any complaints  has no headache,no chest abdominal pain,no new weakness tingling or numbness, feels much better wants to  Objective:   Blood pressure (!) 115/57, pulse (!) 110, temperature 98.2 F (36.8 C), temperature source Axillary, resp. rate 20, height 5\' 5"  (1.651 m), weight 61.3 kg (135 lb 2.3 oz), SpO2 94 %.   Intake/Output Summary (Last 24 hours) at 05/30/16 1400 Last data filed at 05/30/16 0658  Gross per 24 hour  Intake               10 ml  Output              975 ml  Net             -965 ml    Exam Lethargic, hard to arouse, appears comfortable Supple Neck,No JVD,  Symmetrical Chest wall movement, he managed air entry bilaterally, no wheezing RRR,No  Gallops,Rubs or new Murmurs, No Parasternal Heave +ve B.Sounds, Abd Soft, Non tender, No Cyanosis, Clubbing or edema,   Data Review   CBC w Diff:  Lab Results  Component Value Date   WBC 9.3 05/29/2016   HGB 8.6 (L) 05/29/2016   HGB 7.7 (L) 05/18/2016   HCT 26.8 (L) 05/29/2016   HCT 24.3 (L) 05/18/2016   PLT 91 (L) 05/29/2016   PLT 56 (L) 05/18/2016   LYMPHOPCT 10 05/28/2016   LYMPHOPCT 11.2 (L) 05/18/2016   MONOPCT 7 05/28/2016   MONOPCT 6.9 05/18/2016   EOSPCT 1 05/28/2016   EOSPCT 0.9 05/18/2016   BASOPCT 0 05/28/2016   BASOPCT 0.1 05/18/2016    CMP:  Lab Results  Component Value Date   NA 134 (L) 05/29/2016   NA 138 01/05/2014   K 4.2 05/29/2016   K 4.6 01/05/2014   CL 101 05/29/2016   CO2 26 05/29/2016   CO2 25 01/05/2014   BUN 35 (H) 05/29/2016   BUN 25.0 01/05/2014   CREATININE 2.28 (H) 05/29/2016   CREATININE 2.22 (H) 12/21/2015   CREATININE 1.7 (H) 01/05/2014   PROT 5.7 (L) 04/05/2016   PROT 7.1 01/05/2014   ALBUMIN 2.4 (L) 04/05/2016   ALBUMIN 3.7 01/05/2014   BILITOT  1.5 (H) 04/05/2016   BILITOT 0.91 01/05/2014   ALKPHOS 150 (H) 04/05/2016   ALKPHOS 254 (H) 01/05/2014   AST 44 (H) 04/05/2016   AST 64 (H) 01/05/2014   ALT 28 04/05/2016   ALT 70 (H) 01/05/2014  .   Total Time in preparing paper work, data evaluation and todays exam - 35 minutes  ELGERGAWY, DAWOOD M.D on 05/30/2016 at 2:00 PM  [facility]   Office  [phone]

## 2016-05-30 NOTE — Progress Notes (Signed)
SLP  Note  Patient Details Name: Anthony Hutchinson MRN: [id number] DOB: [birth year]   Reason Eval/Treat Not Completed: Other (comment) (pt to dc to hospice facility today, will sign off, thanks)   Luanna Salk, [facility] SLP [phone]

## 2016-05-30 NOTE — Progress Notes (Signed)
CSW received call from [place] at [facility] stating that they have a bed available & will do admission paperwork with patient's daughter/POA, Thayer Headings. CSW paged Dr. Waldron Labs to do a discharge summary. CSW will then setup transportation.    Raynaldo Opitz, [facility] Clinical Social Worker cell #: [phone]

## 2016-05-30 NOTE — Care Management Note (Signed)
Case Management Note  Patient Details  Name: Anthony Hutchinson MRN: [id number] Date of Birth: 05/03/1922  Subjective/Objective: 82 y/o m admitted w/PNA. From home. CSW d/c to residential hospice.                   Action/Plan:hospice medical facility   Expected Discharge Date:                  Expected Discharge Plan:  [place]  In-House Referral:  Clinical Social Work  Discharge planning Services  CM Consult  Post Acute Care Choice:    Choice offered to:     DME Arranged:    DME Agency:     HH Arranged:    [place] Agency:     Status of Service:  Completed, signed off  If discussed at [employer], dates discussed:    Additional Comments:  Dessa Phi, RN 05/30/2016, 2:28 PM

## 2016-05-30 NOTE — Discharge Instructions (Signed)
Management per [facility] - patient is NPO for sever dysphagia, may require frequent suctioning, they have ice chips if able to tolerate

## 2016-05-30 NOTE — Consult Note (Signed)
Consultation Note Date: 05/30/2016   Patient Name: Anthony Hutchinson  DOB: 1922/02/09  MRN: [id number]  Age / Sex: 82 y.o., male  PCP: Merrilee Seashore, MD Referring Physician: Albertine Patricia, MD  Reason for Consultation: Establishing goals of care and Hospice Evaluation  HPI/Patient Profile: 82 y.o. male with past medical history of complete heart block s/p pacemaker, mixed heart failure, CKD 4, myelodysplastic syndrome, prostate cancer, PVD, severe dysphagia and DM who was admitted on 05/28/2016 with altered mental status and CHF vs pneumonia. Today his creatinine is climbing.  Anthony Hutchinson has had 3 admissions in the last 6 months.  He lives at home with his son.  He has been declining slowly with weight loss and recurrent UTIs, but in the last 10 days has had a rapid decline.  He began having paranoia and delusions.    Clinical Assessment and Goals of Care: Anthony Hutchinson is a frail elderly man.  He has silent aspiration and appears to aspirate even on his own saliva.  He is completely confused.  I spoke with his son, Anthony Hutchinson this morning.  He has cared for his father for the past 10 years.  Anthony Hutchinson understands his father is at end of life and wants everything done to keep him comfortable and out of pain.  Family gives the OK for comfort feeds.  They understand the risk of aspiration. Family has requested residential hospice services.  His daughter Anthony Hutchinson is his HCPOA.  I called Anthony Hutchinson on [phone] and left a voice mail.   Primary Decision Maker:  NEXT OF KIN    SUMMARY OF RECOMMENDATIONS    Code Status/Advance Care Planning:  DNR    Symptom Management:    Haldol for delirium.  Per RN he did not respond well to ativan.  Per son Anthony Hutchinson is reluctant to use morphine as she does not want her father to be overly sedated.  Will use very low dose morphine for comfort.  Will request sitter for  safety.  Patient is easily redirected.  Palliative Prophylaxis:   Aspiration and Delirium Protocol  Additional Recommendations (Limitations, Scope, Preferences):  Full Comfort Care  Psycho-social/Spiritual:   Desire for further Chaplaincy support:yes  Additional Recommendations: Caregiving  Support/Resources  Prognosis:   < 2 weeks given advanced age, frailty, altered mental status, heart failure, aspiration on salvia, recurrent infections.  Discharge Planning: Hospice facility      Primary Diagnoses: Present on Admission: . Pneumonia . Altered mental state . Anemia associated with chronic renal failure . Chronic systolic heart failure ([place]) . Hypothyroidism . MDS (myelodysplastic syndrome) ([place]) . Pacemaker . Stage III chronic kidney disease . Thrombocytopenia ([place])   I have reviewed the medical record, interviewed the patient and family, and examined the patient. The following aspects are pertinent.  Past Medical History:  Diagnosis Date  . Anemia    iron deficiency anemia  . Cancer ([place])   . Cataracts, bilateral   . CHF (congestive heart failure) ([place])   . CKD (chronic kidney  disease)   . Complete heart block ([place])   . Diabetes mellitus without complication ([place])   . Hypertension   . Hypothyroidism    nodule  . Inguinal hernia   . LBBB (left bundle branch block)   . MDS (myelodysplastic syndrome) ([place]) 01/05/2014  . MDS (myelodysplastic syndrome) ([place])   . Peripheral vascular disease ([place])    Peripheral neuropathy  . Presence of permanent cardiac pacemaker    Social History   Social History  . Marital status: Married    Spouse name: N/A  . Number of children: N/A  . Years of education: N/A   Social History Main Topics  . Smoking status: Former Smoker    Types: Pipe    Quit date: 11/27/1963  . Smokeless tobacco: Never Used  . Alcohol use No  . Drug use: No  . Sexual activity: Not Asked   Other Topics Concern  . None   Social History  Narrative  . None   Family History  Problem Relation Age of Onset  . Heart disease Mother   . Emphysema Mother   . Congestive Heart Failure Father   . Heart disease Son   . Hyperlipidemia Son    Scheduled Meds: . fluticasone  2 spray Each Nare Daily  . sodium chloride flush  10-40 mL Intracatheter Q12H  . sodium chloride flush  3 mL Intravenous Q12H   Continuous Infusions: PRN Meds:.sodium chloride, acetaminophen **OR** acetaminophen, albuterol, antiseptic oral rinse, diphenhydrAMINE, glycopyrrolate **OR** glycopyrrolate **OR** glycopyrrolate, haloperidol **OR** haloperidol **OR** haloperidol lactate, hydrocortisone, loperamide, morphine injection, ondansetron **OR** ondansetron (ZOFRAN) IV, polyvinyl alcohol, sodium chloride flush, sodium chloride flush, sodium phosphate Allergies  Allergen Reactions  . Cephalosporins Rash  . Codeine Sulfate Itching    Itching on fingers   . Penicillins Other (See Comments)    Itching on fingers Has patient had a PCN reaction causing immediate rash, facial/tongue/throat swelling, SOB or lightheadedness with hypotension: Has patient had a PCN reaction causing severe rash involving mucus membranes or skin necrosis: Has patient had a PCN reaction that required hospitalization Has patient had a PCN reaction occurring within the last 10 years:  If all of the above answers are "NO", then may proceed with Cephalosporin use.    Review of Systems patient unable to give.  Physical Exam  Constitutional:  Frail, confused, male  HENT:  Head: Normocephalic and atraumatic.  Mouth/Throat: No oropharyngeal exudate.  Eyes: Pupils are equal, round, and reactive to light.  Cardiovascular: Normal rate.   Murmur heard. Pulmonary/Chest: Effort normal. No respiratory distress. He has no wheezes.  Abdominal: Soft. Bowel sounds are normal. He exhibits no distension. There is no tenderness.  thin  Musculoskeletal: He exhibits no edema or deformity.    Neurological: He is alert.  Confused but able to respond to commands.  Skin: Skin is warm and dry. No erythema.  Psychiatric:  Confused.  Orientated to person.  Able to be redirected.    Vital Signs: BP (!) 115/57 (BP Location: Left Arm)   Pulse (!) 110   Temp 98.2 F (36.8 C) (Axillary)   Resp 20   Ht 5\' 5"  (1.651 m)   Wt 61.3 kg (135 lb 2.3 oz)   SpO2 94%   BMI 22.49 kg/m  Pain Assessment: PAINAD   Pain Score: Asleep   SpO2: SpO2: 94 % O2 Device:SpO2: 94 % O2 Flow Rate: .O2 Flow Rate (L/min): 2 L/min  IO: Intake/output summary:   Intake/Output Summary (Last 24 hours) at 05/30/16 0824 Last  data filed at 05/30/16 0658  Gross per 24 hour  Intake               10 ml  Output             1375 ml  Net            -1365 ml    LBM: Last BM Date: 05/29/16 Baseline Weight: Weight: 72.6 kg (160 lb) Most recent weight: Weight: 61.3 kg (135 lb 2.3 oz)     Palliative Assessment/Data:   Flowsheet Rows   Flowsheet Row Most Recent Value  Intake Tab  Referral Department  Hospitalist  Unit at Time of Referral  Med/Surg Unit  Palliative Care Primary Diagnosis  Neurology  Date Notified  05/29/16  Palliative Care Type  New Palliative care  Reason for referral  End of Life Care Assistance, Counsel Regarding Hospice  Date of Admission  05/29/16  Date first seen by Palliative Care  05/30/16  # of days Palliative referral response time  1 Day(s)  # of days IP prior to Palliative referral  0  Clinical Assessment  Palliative Performance Scale Score  20%  Pain Max last 24 hours  1  Anxiety Max Last 24 Hours  5  Psychosocial & Spiritual Assessment  Palliative Care Outcomes  Patient/Family meeting held?  Yes  Who was at the meeting?  patient and his son was on the phone  Palliative Care Outcomes  Clarified goals of care, Counseled regarding hospice, Transitioned to hospice      Time In: 7:14 Time Out: 7:25 Time Total: 71 min. Greater than 50%  of this time was spent  counseling and coordinating care related to the above assessment and plan.  Signed by: Imogene Burn, PA-C Palliative Medicine Pager: [phone]  Please contact Palliative Medicine Team phone at [phone] for questions and concerns.  For individual provider: See Shea Evans

## 2016-06-02 ENCOUNTER — Ambulatory Visit: Payer: Medicare Other

## 2016-06-02 ENCOUNTER — Other Ambulatory Visit: Payer: Medicare Other

## 2016-06-07 ENCOUNTER — Ambulatory Visit: Payer: Medicare Other | Admitting: Podiatry

## 2016-06-09 DEATH — deceased

## 2016-06-14 ENCOUNTER — Ambulatory Visit: Payer: Medicare Other

## 2016-06-14 ENCOUNTER — Other Ambulatory Visit: Payer: Medicare Other

## 2016-06-26 ENCOUNTER — Telehealth: Payer: Self-pay | Admitting: Cardiology

## 2016-06-26 ENCOUNTER — Encounter: Payer: Self-pay | Admitting: *Deleted

## 2016-06-26 NOTE — Telephone Encounter (Signed)
LMOVM reminding pt to send remote transmission.   

## 2016-06-29 ENCOUNTER — Other Ambulatory Visit: Payer: Medicare Other

## 2016-06-29 ENCOUNTER — Ambulatory Visit: Payer: Medicare Other

## 2016-06-30 ENCOUNTER — Encounter: Payer: Self-pay | Admitting: Cardiology

## 2016-07-13 ENCOUNTER — Ambulatory Visit: Payer: Medicare Other

## 2016-07-13 ENCOUNTER — Other Ambulatory Visit: Payer: Medicare Other

## 2016-07-27 ENCOUNTER — Ambulatory Visit: Payer: Medicare Other

## 2016-07-27 ENCOUNTER — Other Ambulatory Visit: Payer: Medicare Other

## 2016-08-10 ENCOUNTER — Ambulatory Visit: Payer: Medicare Other

## 2016-08-10 ENCOUNTER — Other Ambulatory Visit: Payer: Medicare Other

## 2016-08-24 ENCOUNTER — Ambulatory Visit: Payer: Medicare Other

## 2016-08-24 ENCOUNTER — Other Ambulatory Visit: Payer: Medicare Other

## 2016-09-07 ENCOUNTER — Other Ambulatory Visit: Payer: Medicare Other

## 2016-09-07 ENCOUNTER — Ambulatory Visit: Payer: Medicare Other

## 2016-09-07 ENCOUNTER — Ambulatory Visit: Payer: Medicare Other | Admitting: Hematology and Oncology

## 2016-10-20 ENCOUNTER — Other Ambulatory Visit: Payer: Self-pay | Admitting: Nurse Practitioner

## 2017-08-16 IMAGING — US IR FLUORO GUIDE CV LINE*R*
1 series · 2 of 2 positions shown · non-contrast
Comparison: none

CLINICAL DATA: Thrombocytopenia. Difficult venous access with
extensive bruising. Durable venous access is requested.
TECHNIQUE: The procedure, risks, benefits, and alternatives were explained to
the patient. Questions regarding the procedure were encouraged and
answered. The patient understands and consents to the procedure. As
antibiotic prophylaxis, vancomycin 1 g was ordered pre-procedure and
administered intravenously within one hour of incision. Patency of
the right IJ vein was confirmed with ultrasound with image
documentation. An appropriate skin site was determined. Skin site
was marked. Region was prepped using maximum barrier technique
including cap and mask, sterile gown, sterile gloves, large sterile
sheet, and Chlorhexidine as cutaneous antisepsis. The region was
infiltrated locally with 1% lidocaine. Under real-time ultrasound
guidance, the right IJ vein was accessed with a 21 gauge
micropuncture needle; the needle tip within the vein was confirmed
with ultrasound image documentation. Needle was exchanged over a 018
guidewire for transitional dilator which allowed passage of the
Benson wire into the IVC. Over this, the transitional dilator was
exchanged for a 5 French MPA catheter. A small incision was made on
the right anterior chest wall and a subcutaneous pocket fashioned.
The power-injectable port was positioned and its catheter tunneled
to the right IJ dermatotomy site. The MPA catheter was exchanged
over an Amplatz wire for a peel-away sheath, through which the port
catheter, which had been trimmed to the appropriate length, was
advanced and positioned under fluoroscopy with its tip at the
cavoatrial junction. Spot chest radiograph confirms good catheter
position and no pneumothorax. The pocket was closed with deep
interrupted and subcuticular continuous 3-0 Monocryl sutures. The
port was flushed per protocol. The incisions were covered with
Dermabond then covered with a sterile dressing.

COMPLICATIONS:
COMPLICATIONS
None immediate

[Series 1: ir fluoro/shunt/fist · 2 of 2 slices shown]
[im 1/2]
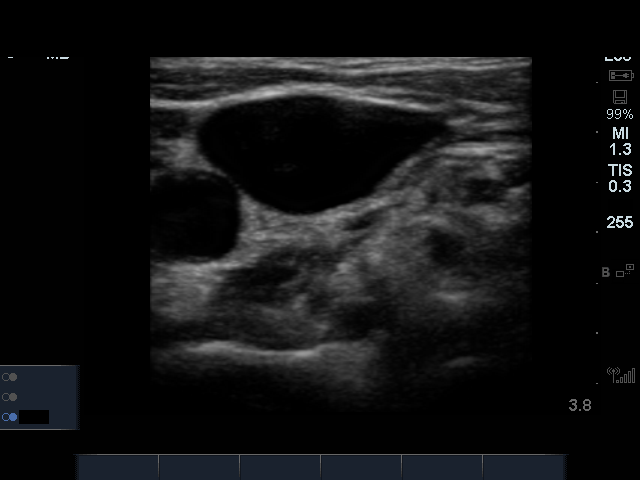
[im 2/2]
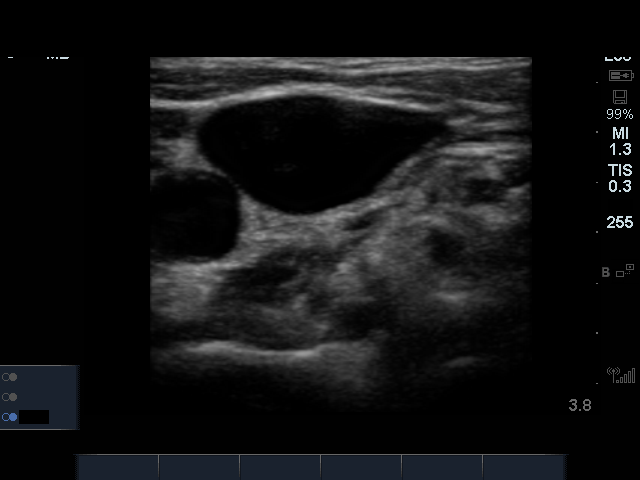

[2 of 2 positions shown; findings below may reference images not displayed]

EXAM:
TUNNELED PORT CATHETER PLACEMENT WITH ULTRASOUND AND FLUOROSCOPIC
GUIDANCE

FLUOROSCOPY TIME:  0.4 minute, 158 uJymZ DAP

ANESTHESIA/SEDATION:
Intravenous Fentanyl and Versed were administered as conscious
sedation during continuous monitoring of the patient's level of
consciousness and physiological / cardiorespiratory status by the
radiology RN, with a total moderate sedation time of 17 minutes.
IMPRESSION: Technically successful right IJ power-injectable port catheter
placement. Ready for routine use.

## 2017-08-16 IMAGING — XA IR FLUORO GUIDE CV LINE*R*
1 series · 1 of 1 positions shown · non-contrast
Comparison: none

CLINICAL DATA: Thrombocytopenia. Difficult venous access with
extensive bruising. Durable venous access is requested.
TECHNIQUE: The procedure, risks, benefits, and alternatives were explained to
the patient. Questions regarding the procedure were encouraged and
answered. The patient understands and consents to the procedure. As
antibiotic prophylaxis, vancomycin 1 g was ordered pre-procedure and
administered intravenously within one hour of incision. Patency of
the right IJ vein was confirmed with ultrasound with image
documentation. An appropriate skin site was determined. Skin site
was marked. Region was prepped using maximum barrier technique
including cap and mask, sterile gown, sterile gloves, large sterile
sheet, and Chlorhexidine as cutaneous antisepsis. The region was
infiltrated locally with 1% lidocaine. Under real-time ultrasound
guidance, the right IJ vein was accessed with a 21 gauge
micropuncture needle; the needle tip within the vein was confirmed
with ultrasound image documentation. Needle was exchanged over a 018
guidewire for transitional dilator which allowed passage of the
Benson wire into the IVC. Over this, the transitional dilator was
exchanged for a 5 French MPA catheter. A small incision was made on
the right anterior chest wall and a subcutaneous pocket fashioned.
The power-injectable port was positioned and its catheter tunneled
to the right IJ dermatotomy site. The MPA catheter was exchanged
over an Amplatz wire for a peel-away sheath, through which the port
catheter, which had been trimmed to the appropriate length, was
advanced and positioned under fluoroscopy with its tip at the
cavoatrial junction. Spot chest radiograph confirms good catheter
position and no pneumothorax. The pocket was closed with deep
interrupted and subcuticular continuous 3-0 Monocryl sutures. The
port was flushed per protocol. The incisions were covered with
Dermabond then covered with a sterile dressing.

COMPLICATIONS:
COMPLICATIONS
None immediate

[Series 300: line placements · 1 of 1 slices shown]
[im 1/1]
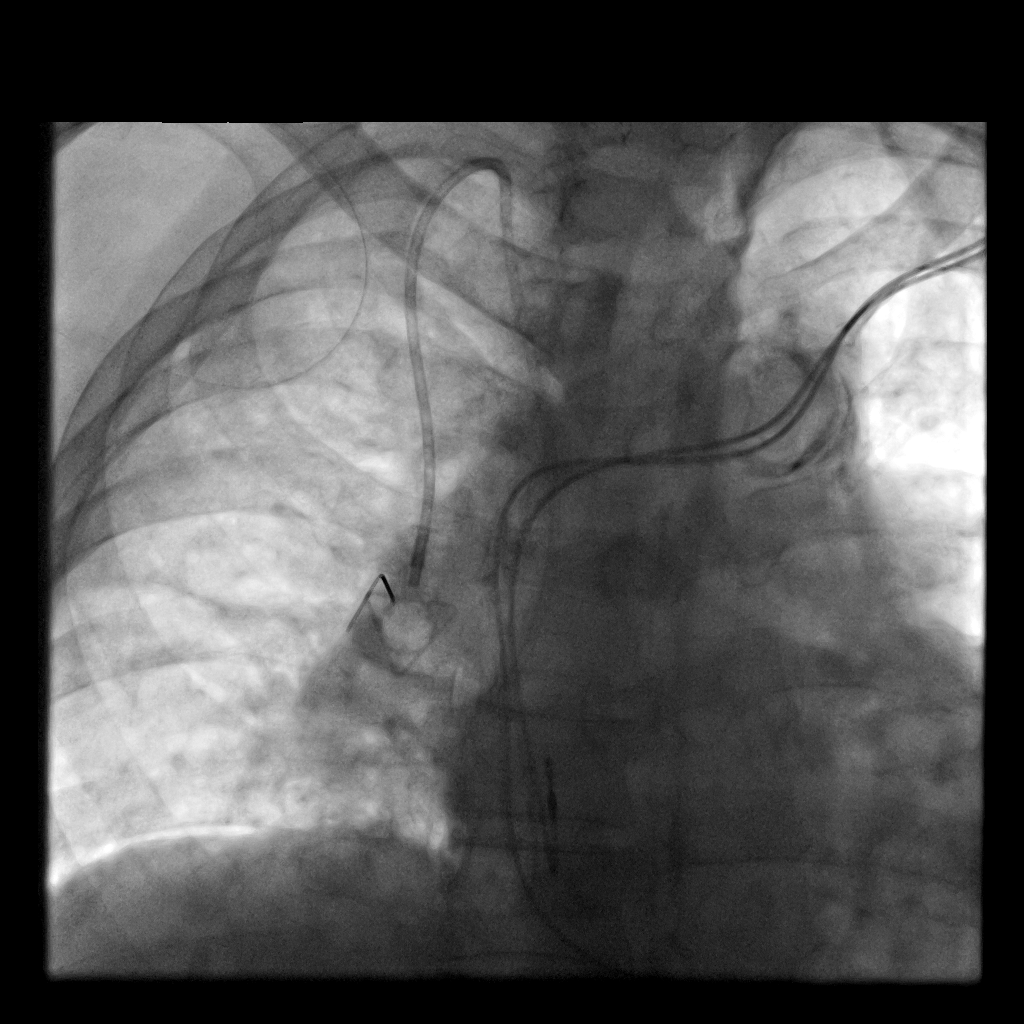

[1 of 1 positions shown; findings below may reference images not displayed]

EXAM:
TUNNELED PORT CATHETER PLACEMENT WITH ULTRASOUND AND FLUOROSCOPIC
GUIDANCE

FLUOROSCOPY TIME:  0.4 minute, 158 uJymZ DAP

ANESTHESIA/SEDATION:
Intravenous Fentanyl and Versed were administered as conscious
sedation during continuous monitoring of the patient's level of
consciousness and physiological / cardiorespiratory status by the
radiology RN, with a total moderate sedation time of 17 minutes.
IMPRESSION: Technically successful right IJ power-injectable port catheter
placement. Ready for routine use.

## 2017-11-21 IMAGING — CR DG CHEST 2V
2 series · 2 of 2 positions shown · non-contrast
Comparison: 04/04/2016

CLINICAL DATA: Dyspnea.  Cough.  Aspiration.

EXAM:
CHEST  2 VIEW

[w chest lat]
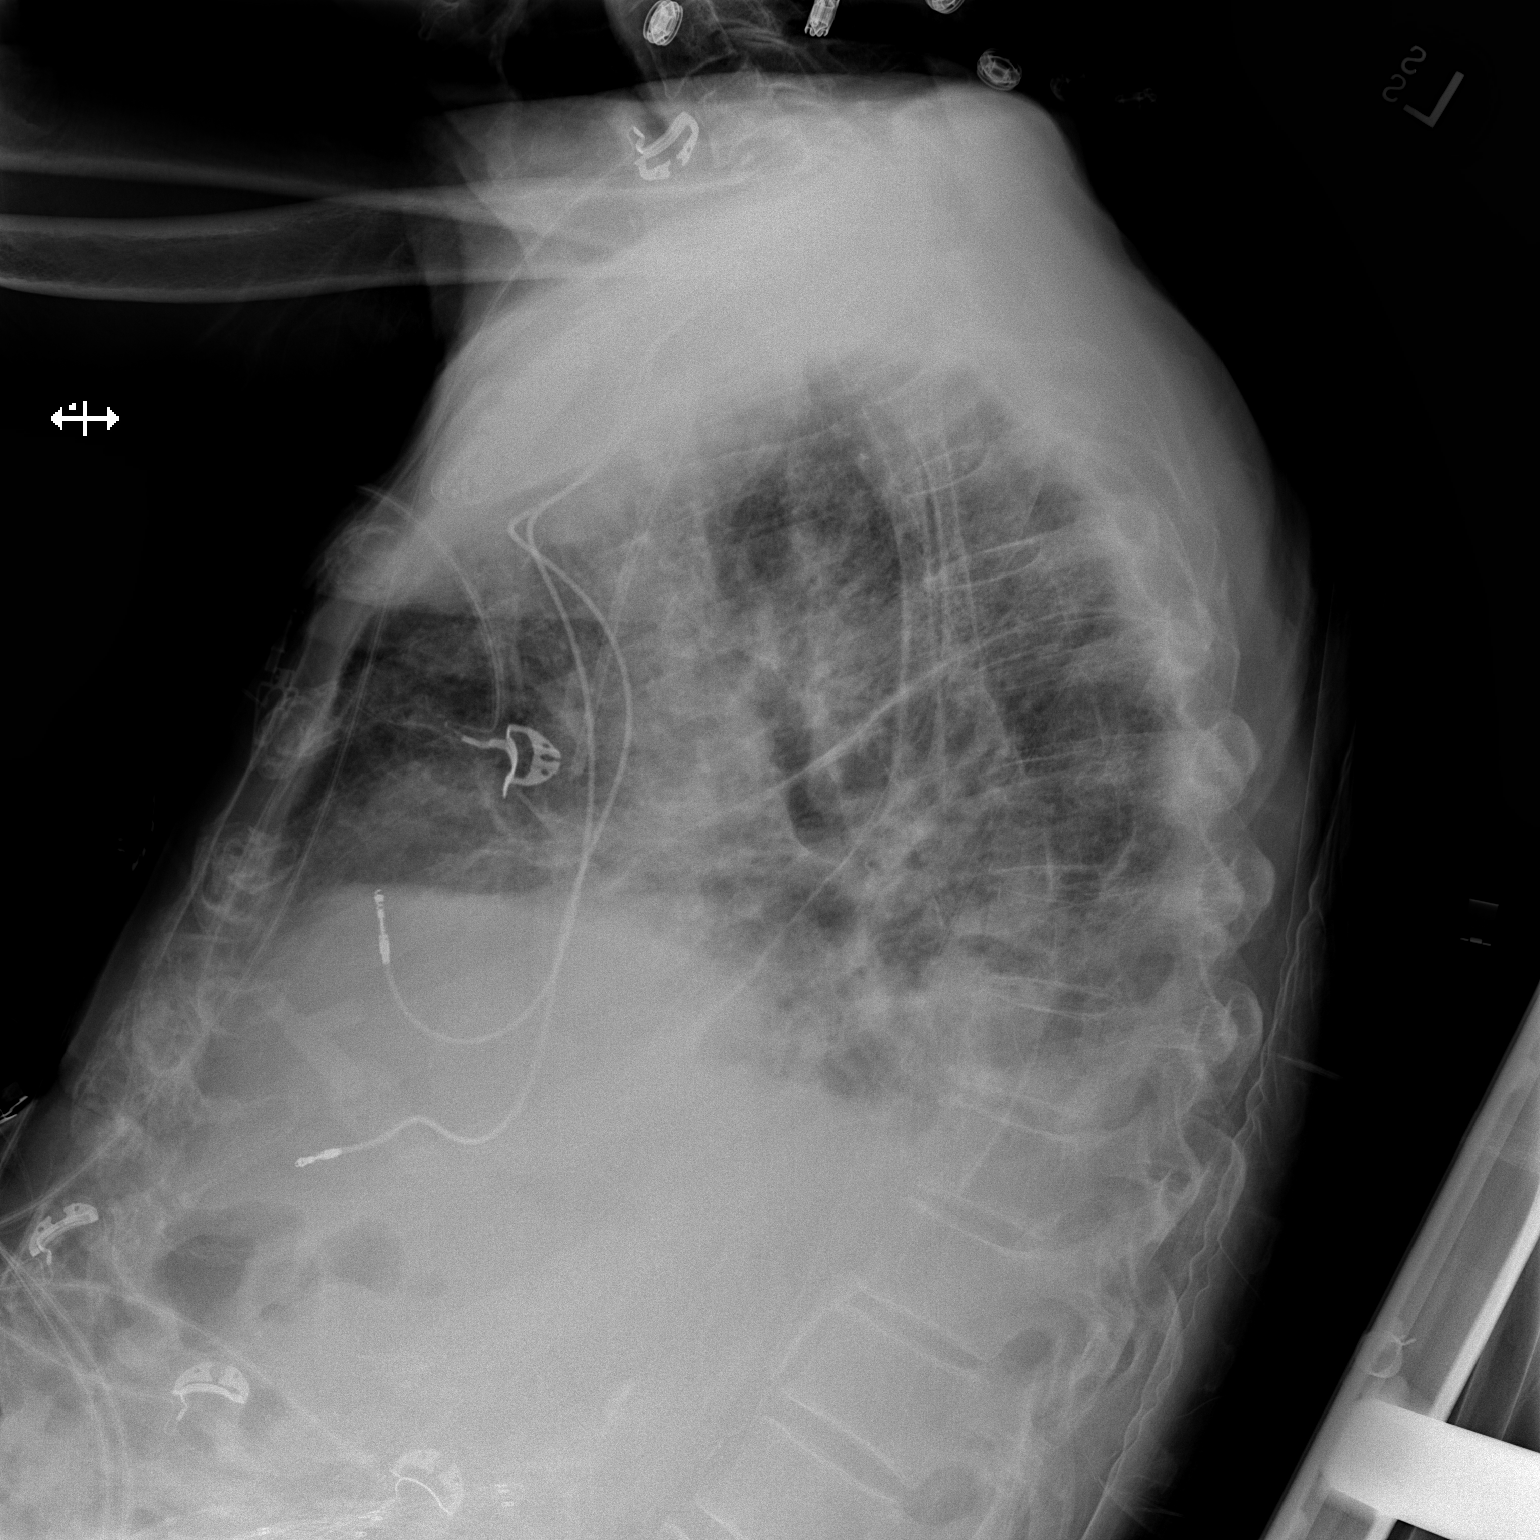

[x chest ap]
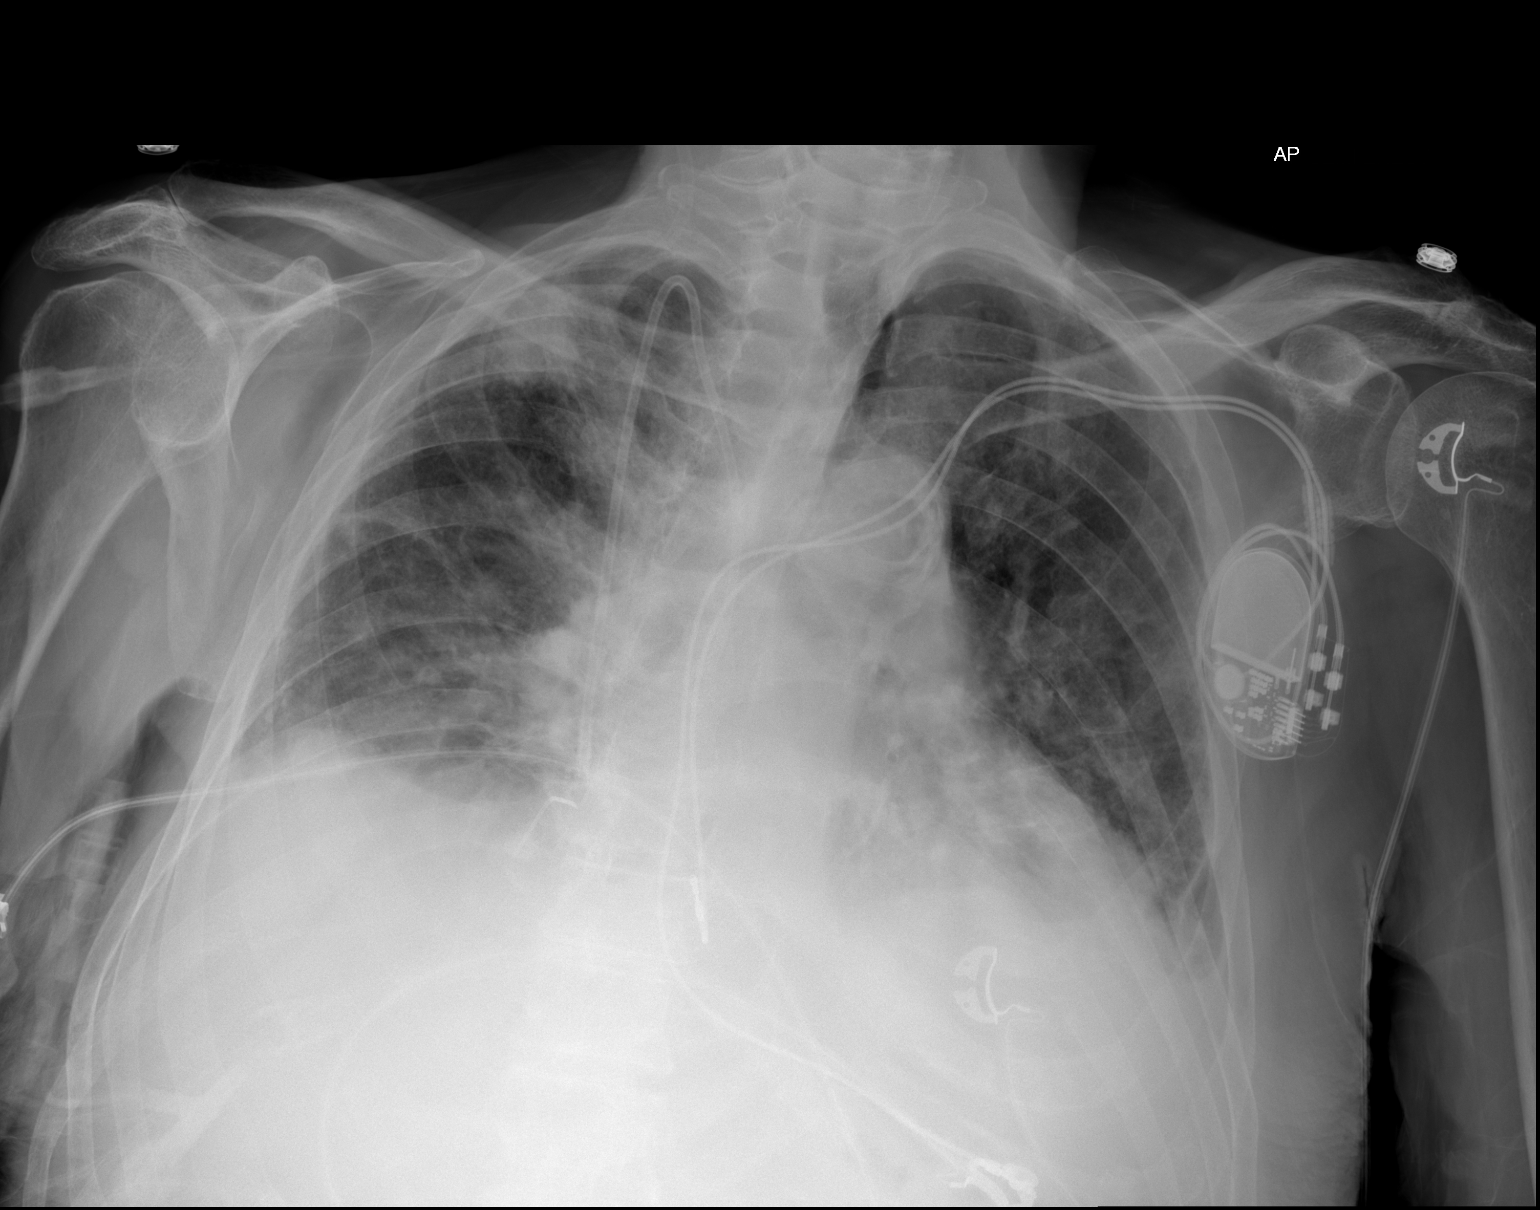

[2 of 2 positions shown; findings below may reference images not displayed]

FINDINGS: Lateral view degraded by patient arm position. Patient rotated to
the left. Right sided Port-A-Cath terminates at the low SVC. Dual
lead pacer. Cardiomegaly accentuated by AP portable technique. Small
bilateral pleural effusions. No pneumothorax. Interstitial
prominence is greater on the right than left and not significantly
changed. Bilateral airspace opacities, including within the right
upper and both lower lobes. The right upper lobe opacity is a
somewhat nodular in appearance and is in the region of support
apparatus artifact.
IMPRESSION: congestive heart failure with small bilateral pleural effusions.

Multifocal airspace opacities which could represent alveolar edema
or infection/ aspiration.

Apparent nodular component of the right upper lobe opacity which
could be partially artifactual. Recommend attention on follow-up.
